# Patient Record
Sex: Male | Born: 1935 | Race: Black or African American | Hispanic: No | Marital: Married | Smoking: Former smoker
Health system: Southern US, Community
[De-identification: ages and names within clinical notes are randomized; demographics above are authoritative.]

## PROBLEM LIST (undated history)

## (undated) DIAGNOSIS — C61 Malignant neoplasm of prostate: Secondary | ICD-10-CM

## (undated) DIAGNOSIS — K219 Gastro-esophageal reflux disease without esophagitis: Secondary | ICD-10-CM

## (undated) DIAGNOSIS — R351 Nocturia: Secondary | ICD-10-CM

## (undated) DIAGNOSIS — Z8042 Family history of malignant neoplasm of prostate: Secondary | ICD-10-CM

## (undated) DIAGNOSIS — K5900 Constipation, unspecified: Secondary | ICD-10-CM

## (undated) DIAGNOSIS — R350 Frequency of micturition: Secondary | ICD-10-CM

## (undated) DIAGNOSIS — N400 Enlarged prostate without lower urinary tract symptoms: Secondary | ICD-10-CM

## (undated) DIAGNOSIS — R3915 Urgency of urination: Secondary | ICD-10-CM

## (undated) DIAGNOSIS — I2510 Atherosclerotic heart disease of native coronary artery without angina pectoris: Secondary | ICD-10-CM

## (undated) DIAGNOSIS — R339 Retention of urine, unspecified: Secondary | ICD-10-CM

## (undated) DIAGNOSIS — R972 Elevated prostate specific antigen [PSA]: Secondary | ICD-10-CM

## (undated) DIAGNOSIS — M1990 Unspecified osteoarthritis, unspecified site: Secondary | ICD-10-CM

## (undated) DIAGNOSIS — Z803 Family history of malignant neoplasm of breast: Secondary | ICD-10-CM

## (undated) DIAGNOSIS — I10 Essential (primary) hypertension: Secondary | ICD-10-CM

## (undated) HISTORY — DX: Family history of malignant neoplasm of breast: Z80.3

## (undated) HISTORY — DX: Essential (primary) hypertension: I10

## (undated) HISTORY — DX: Malignant neoplasm of prostate: C61

## (undated) HISTORY — PX: GALLBLADDER SURGERY: SHX652

## (undated) HISTORY — DX: Nocturia: R35.1

## (undated) HISTORY — DX: Atherosclerotic heart disease of native coronary artery without angina pectoris: I25.10

## (undated) HISTORY — PX: STOMACH SURGERY: SHX791

## (undated) HISTORY — PX: PROSTATE SURGERY: SHX751

## (undated) HISTORY — DX: Frequency of micturition: R35.0

## (undated) HISTORY — DX: Gastro-esophageal reflux disease without esophagitis: K21.9

## (undated) HISTORY — DX: Unspecified osteoarthritis, unspecified site: M19.90

## (undated) HISTORY — DX: Benign prostatic hyperplasia without lower urinary tract symptoms: N40.0

## (undated) HISTORY — DX: Family history of malignant neoplasm of prostate: Z80.42

## (undated) HISTORY — DX: Constipation, unspecified: K59.00

## (undated) HISTORY — DX: Retention of urine, unspecified: R33.9

## (undated) HISTORY — DX: Urgency of urination: R39.15

## (undated) HISTORY — DX: Elevated prostate specific antigen (PSA): R97.20

---

## 2004-09-30 ENCOUNTER — Ambulatory Visit: Payer: Self-pay | Admitting: Gastroenterology

## 2004-12-16 ENCOUNTER — Ambulatory Visit: Payer: Self-pay | Admitting: Gastroenterology

## 2004-12-24 ENCOUNTER — Ambulatory Visit: Payer: Self-pay | Admitting: Gastroenterology

## 2005-05-13 ENCOUNTER — Ambulatory Visit: Payer: Self-pay | Admitting: Gastroenterology

## 2005-05-15 ENCOUNTER — Ambulatory Visit: Payer: Self-pay | Admitting: Gastroenterology

## 2005-07-09 ENCOUNTER — Ambulatory Visit: Payer: Self-pay | Admitting: Gastroenterology

## 2006-07-10 ENCOUNTER — Ambulatory Visit: Payer: Self-pay | Admitting: Gastroenterology

## 2006-07-27 ENCOUNTER — Ambulatory Visit: Payer: Self-pay | Admitting: Gastroenterology

## 2006-07-30 ENCOUNTER — Ambulatory Visit: Payer: Self-pay | Admitting: Gastroenterology

## 2006-08-12 ENCOUNTER — Other Ambulatory Visit: Payer: Self-pay

## 2006-08-12 ENCOUNTER — Ambulatory Visit: Payer: Self-pay | Admitting: General Surgery

## 2006-08-17 ENCOUNTER — Ambulatory Visit: Payer: Self-pay | Admitting: General Surgery

## 2007-02-28 ENCOUNTER — Ambulatory Visit: Payer: Self-pay | Admitting: Gastroenterology

## 2007-06-28 ENCOUNTER — Ambulatory Visit: Payer: Self-pay | Admitting: Gastroenterology

## 2008-01-10 ENCOUNTER — Ambulatory Visit: Payer: Self-pay | Admitting: Unknown Physician Specialty

## 2008-05-09 ENCOUNTER — Ambulatory Visit: Payer: Self-pay | Admitting: Gastroenterology

## 2008-05-09 DIAGNOSIS — K219 Gastro-esophageal reflux disease without esophagitis: Secondary | ICD-10-CM

## 2008-12-20 ENCOUNTER — Ambulatory Visit: Payer: Self-pay | Admitting: Gastroenterology

## 2009-01-09 ENCOUNTER — Ambulatory Visit: Payer: Self-pay | Admitting: Surgery

## 2009-04-29 ENCOUNTER — Ambulatory Visit: Payer: Self-pay | Admitting: Surgery

## 2009-05-06 ENCOUNTER — Inpatient Hospital Stay: Payer: Self-pay | Admitting: Surgery

## 2011-04-01 ENCOUNTER — Ambulatory Visit: Payer: Self-pay | Admitting: Gastroenterology

## 2011-07-12 ENCOUNTER — Emergency Department: Payer: Self-pay | Admitting: Emergency Medicine

## 2011-07-12 LAB — COMPREHENSIVE METABOLIC PANEL
Alkaline Phosphatase: 45 U/L — ABNORMAL LOW (ref 50–136)
BUN: 15 mg/dL (ref 7–18)
Bilirubin,Total: 1.5 mg/dL — ABNORMAL HIGH (ref 0.2–1.0)
Calcium, Total: 9.2 mg/dL (ref 8.5–10.1)
Chloride: 104 mmol/L (ref 98–107)
Co2: 30 mmol/L (ref 21–32)
Creatinine: 1.06 mg/dL (ref 0.60–1.30)
EGFR (Non-African Amer.): >60
Glucose: 89 mg/dL (ref 65–99)
Osmolality: 287 (ref 275–301)
SGOT(AST): 28 U/L (ref 15–37)
SGPT (ALT): 34 U/L
Sodium: 144 mmol/L (ref 136–145)
Total Protein: 8.5 g/dL — ABNORMAL HIGH (ref 6.4–8.2)

## 2011-07-12 LAB — APTT: Activated PTT: 33.2 secs (ref 23.6–35.9)

## 2011-07-12 LAB — CBC
MCH: 29.4 pg (ref 26.0–34.0)
Platelet: 241 10*3/uL (ref 150–440)
RBC: 4.87 10*6/uL (ref 4.40–5.90)
RDW: 15.6 % — ABNORMAL HIGH (ref 11.5–14.5)
WBC: 11.3 10*3/uL — ABNORMAL HIGH (ref 3.8–10.6)

## 2011-07-12 LAB — CK TOTAL AND CKMB (NOT AT ARMC)
CK, Total: 210 U/L (ref 35–232)
CK-MB: 0.9 ng/mL (ref 0.5–3.6)

## 2012-04-07 ENCOUNTER — Ambulatory Visit: Payer: Self-pay | Admitting: Family Medicine

## 2013-03-02 ENCOUNTER — Ambulatory Visit: Payer: Self-pay | Admitting: Gastroenterology

## 2013-06-19 ENCOUNTER — Ambulatory Visit: Payer: Self-pay | Admitting: Unknown Physician Specialty

## 2014-11-12 ENCOUNTER — Encounter: Payer: Self-pay | Admitting: *Deleted

## 2014-11-13 ENCOUNTER — Other Ambulatory Visit: Payer: Self-pay | Admitting: Urology

## 2014-11-13 DIAGNOSIS — N138 Other obstructive and reflux uropathy: Secondary | ICD-10-CM

## 2014-11-13 DIAGNOSIS — N401 Enlarged prostate with lower urinary tract symptoms: Secondary | ICD-10-CM

## 2014-11-13 DIAGNOSIS — R339 Retention of urine, unspecified: Secondary | ICD-10-CM

## 2014-11-14 ENCOUNTER — Ambulatory Visit (INDEPENDENT_AMBULATORY_CARE_PROVIDER_SITE_OTHER): Payer: Medicare Other | Admitting: Urology

## 2014-11-14 ENCOUNTER — Encounter: Payer: Self-pay | Admitting: Urology

## 2014-11-14 DIAGNOSIS — N401 Enlarged prostate with lower urinary tract symptoms: Secondary | ICD-10-CM

## 2014-11-14 DIAGNOSIS — R339 Retention of urine, unspecified: Secondary | ICD-10-CM

## 2014-11-14 DIAGNOSIS — N138 Other obstructive and reflux uropathy: Secondary | ICD-10-CM

## 2014-11-14 LAB — BLADDER SCAN AMB NON-IMAGING: Scan Result: 23

## 2014-11-14 NOTE — Progress Notes (Signed)
11/14/2014 3:51 PM   Matthew Hunter 1930/01/11 [id number]  Referring provider: No referring provider defined for this encounter.  Chief Complaint  Patient presents with  . Elevated PSA    Patient states needs a check up hasn't had one sine 2015  . Benign Prostatic Hypertrophy    HPI: Is to [place] an 81 year old African-American male who was scheduled for yearly follow-up but wanted to touch base sooner. He is currently on finasteride and tamsulosin for his urinary symptoms.  He underwent a TUMT about 6 years ago. He is happy with the way he is urinating at this time.  His PVR is 23 mL.  He denies any fever, chills, nausea, vomiting, dysuria, gross hematuria or suprapubic pain.  PSA History:    2.66 ng/mL on 11/17/2011    4.54 ng/mL on 12/07/2013    3.3 ng/mL on 04/09/2014  PMH: Past Medical History  Diagnosis Date  . Hypertension   . GERD (gastroesophageal reflux disease)   . Arthritis   . CAD (coronary artery disease)   . Benign enlargement of prostate   . Incomplete bladder emptying   . Elevated PSA   . Urgency of micturation   . Urinary frequency   . Nocturia   . Constipation     Surgical History: Past Surgical History  Procedure Laterality Date  . Gallbladder surgery    . Prostate surgery    . Stomach surgery      Home Medications:    Medication List       This list is accurate as of: 11/14/14  3:51 PM.  Always use your most recent med list.               atenolol 50 MG tablet  Commonly known as:  TENORMIN  Take 50 mg by mouth daily.     finasteride 5 MG tablet  Commonly known as:  PROSCAR  Take 5 mg by mouth daily.     nortriptyline 50 MG capsule  Commonly known as:  PAMELOR  Take 50 mg by mouth at bedtime.     nortriptyline 25 MG capsule  Commonly known as:  PAMELOR     omeprazole 20 MG capsule  Commonly known as:  PRILOSEC  Take 20 mg by mouth daily.     tamsulosin 0.4 MG Caps capsule  Commonly known as:  FLOMAX  Take 0.4  mg by mouth daily.     Telmisartan-Amlodipine 80-5 MG Tabs  Take by mouth daily.     traMADol 50 MG tablet  Commonly known as:  ULTRAM  Take 50 mg by mouth every 6 (six) hours as needed.     vitamin B-6 25 MG tablet  Commonly known as:  pyridOXINE  Take 25 mg by mouth daily.        Allergies: No Known Allergies  Family History: Family History  Problem Relation Age of Onset  . Hypertension Mother   . Stroke Father     Social History:  reports that he has quit smoking. He does not have any smokeless tobacco history on file. He reports that he uses illicit drugs about twice per week. He reports that he does not drink alcohol.  ROS: Urological Symptom Review  Patient is experiencing the following symptoms: Frequent urination Get up at night to urinate Stream starts and stops Erection problems (male only)   Review of Systems  Gastrointestinal (upper)  : Negative for upper GI symptoms  Gastrointestinal (lower) : Diarrhea Constipation  Constitutional : Fatigue  Skin: Skin rash/lesion Itching  Eyes: Blurred vision  Ear/Nose/Throat : Sore throat Sinus problems  Hematologic/Lymphatic: Negative for Hematologic/Lymphatic symptoms  Cardiovascular : Chest pain  Respiratory : Cough  Endocrine: Negative for endocrine symptoms  Musculoskeletal: Back pain Joint pain  Neurological: Dizziness  Psychologic: Negative for psychiatric symptoms   Physical Exam: BP 135/74 mmHg  Pulse 66  Ht 5\' 9"  (1.753 m)  Wt 189 lb 9.6 oz (86.002 kg)  BMI 27.99 kg/m2  GU: Patient with uncircumcised phallus. Foreskin easily retracted  Urethral meatus is patent.  No penile discharge. No penile lesions or rashes. Scrotum without lesions, cysts, rashes and/or edema.  Testicles are located scrotally bilaterally. No masses are appreciated in the testicles. Left and right epididymis are normal. Rectal: Patient with  normal sphincter tone. Perineum without scarring or rashes.  No rectal masses are appreciated. Prostate is approximately 60 grams, no nodules are appreciated. Seminal vesicles are normal.   Laboratory Data: Results for orders placed or performed in visit on 11/14/14  PSA  Result Value Ref Range   Prostate Specific Ag, Serum 4.3 (H) 0.0 - 4.0 ng/mL  BLADDER SCAN AMB NON-IMAGING  Result Value Ref Range   Scan Result 23    Lab Results  Component Value Date   WBC 11.3* 07/12/2011   HGB 14.3 07/12/2011   HCT 43.1 07/12/2011   MCV 88 07/12/2011   PLT 241 07/12/2011    Lab Results  Component Value Date   CREATININE 1.06 07/12/2011    No results found for: PSA  No results found for: TESTOSTERONE  No results found for: HGBA1C  Urinalysis No results found for: COLORURINE, APPEARANCEUR, LABSPEC, PHURINE, GLUCOSEU, HGBUR, BILIRUBINUR, KETONESUR, PROTEINUR, UROBILINOGEN, NITRITE, LEUKOCYTESUR  Pertinent Imaging:   Assessment & Plan:    1. BPH with obstruction/lower urinary tract symptoms:  Patient's symptoms are controlled with finasteride and tamsulosin. He will continue the medications.  He will return in 6 months for an IPS S, DRE and PVR.  - PSA drawn today  PSA History:    2.66 ng/mL on 11/17/2011    4.54 ng/mL on 12/07/2013    3.3 ng/mL on 04/09/2014  2. Incomplete bladder emptying:   Patient is doing well with the tamsulosin and finasteride. He will continue these medications.   -BLADDER SCAN AMB NON-IMAGING     [name], [facility] [address], [place], [place] [address] [phone]

## 2014-11-15 LAB — PSA: Prostate Specific Ag, Serum: 4.3 ng/mL — ABNORMAL HIGH (ref 0.0–4.0)

## 2014-11-16 ENCOUNTER — Telehealth: Payer: Self-pay

## 2014-11-16 NOTE — Telephone Encounter (Signed)
LMOM

## 2014-11-16 NOTE — Telephone Encounter (Signed)
-----   Message from Nori Riis, PA-C sent at 11/16/2014  8:31 AM EDT ----- PSA is stable.  We will see him in 6 months.

## 2014-11-22 DIAGNOSIS — N400 Enlarged prostate without lower urinary tract symptoms: Secondary | ICD-10-CM | POA: Insufficient documentation

## 2014-11-22 DIAGNOSIS — R339 Retention of urine, unspecified: Secondary | ICD-10-CM | POA: Insufficient documentation

## 2015-01-25 ENCOUNTER — Telehealth: Payer: Self-pay | Admitting: Urology

## 2015-01-25 NOTE — Telephone Encounter (Signed)
Dr. Barbarann Ehlers office faxed a PSA result over from the 16th of August.  It was elevated at 6.12 ng/mL, but the threshold for considering a biopsy in a gentlemen over 70 is a PSA of 10 ng/mL.  I still suggest following up as scheduled in December.

## 2015-01-25 NOTE — Telephone Encounter (Signed)
Spoke with pt in reference to PSA. Pt voiced understanding.  

## 2015-05-08 ENCOUNTER — Other Ambulatory Visit: Payer: Self-pay | Admitting: Urology

## 2015-05-08 DIAGNOSIS — N400 Enlarged prostate without lower urinary tract symptoms: Secondary | ICD-10-CM

## 2015-05-14 ENCOUNTER — Other Ambulatory Visit: Payer: Medicare Other

## 2015-05-14 DIAGNOSIS — R972 Elevated prostate specific antigen [PSA]: Secondary | ICD-10-CM

## 2015-05-15 LAB — PSA: PROSTATE SPECIFIC AG, SERUM: 6.3 ng/mL — AB (ref 0.0–4.0)

## 2015-05-16 ENCOUNTER — Encounter: Payer: Self-pay | Admitting: Urology

## 2015-05-16 ENCOUNTER — Ambulatory Visit (INDEPENDENT_AMBULATORY_CARE_PROVIDER_SITE_OTHER): Payer: Medicare Other | Admitting: Urology

## 2015-05-16 VITALS — BP 129/71 | HR 76 | Ht 69.0 in | Wt 192.8 lb

## 2015-05-16 DIAGNOSIS — N401 Enlarged prostate with lower urinary tract symptoms: Secondary | ICD-10-CM | POA: Diagnosis not present

## 2015-05-16 DIAGNOSIS — R972 Elevated prostate specific antigen [PSA]: Secondary | ICD-10-CM

## 2015-05-16 DIAGNOSIS — N138 Other obstructive and reflux uropathy: Secondary | ICD-10-CM

## 2015-05-16 NOTE — Progress Notes (Signed)
2:52 PM   Chadron Duel Jan 12, 1930 [id number]  Referring provider: Maryland Pink, MD [address] [facility] [place], [place] [address]  Chief Complaint  Patient presents with  . Elevated PSA    6 month follow up    HPI: Patient is an 81 year old African-American male with a history of elevated PSA and BPH with LUTS who presents today for 6 month follow-up.  Elevated PSA Patient's PSA is currently 6.3 ng/mL.  He is on finasteride at this time.    BPH WITH LUTS His IPSS score today is 12, which is moderate lower urinary tract symptomatology. He is mostly satisfied with his quality life due to his urinary symptoms.  He denies any dysuria, hematuria or suprapubic pain.  He currently taking tamsulosin and finasteride.  His has had TUMT about 7 years ago.   He also denies any recent fevers, chills, nausea or vomiting.  He does not have a family history of PCa.      IPSS      05/16/15 1400       International Prostate Symptom Score   How often have you had the sensation of not emptying your bladder? About half the time     How often have you had to urinate less than every two hours? About half the time     How often have you found you stopped and started again several times when you urinated? Less than half the time     How often have you found it difficult to postpone urination? Not at All     How often have you had a weak urinary stream? About half the time     How often have you had to strain to start urination? Not at All     How many times did you typically get up at night to urinate? 1 Time     Total IPSS Score 12     Quality of Life due to urinary symptoms   If you were to spend the rest of your life with your urinary condition just the way it is now how would you feel about that? Mostly Satisfied        Score:  1-7 Mild 8-19 Moderate 20-35 Severe      PMH: Past Medical History  Diagnosis Date  . Hypertension   . GERD (gastroesophageal reflux  disease)   . Arthritis   . CAD (coronary artery disease)   . Benign enlargement of prostate   . Incomplete bladder emptying   . Elevated PSA   . Urgency of micturation   . Urinary frequency   . Nocturia   . Constipation     Surgical History: Past Surgical History  Procedure Laterality Date  . Gallbladder surgery    . Prostate surgery    . Stomach surgery      Home Medications:    Medication List       This list is accurate as of: 05/16/15  2:52 PM.  Always use your most recent med list.               atenolol 50 MG tablet  Commonly known as:  TENORMIN  Take 50 mg by mouth daily.     finasteride 5 MG tablet  Commonly known as:  PROSCAR  take 1 tablet by mouth once daily     nortriptyline 50 MG capsule  Commonly known as:  PAMELOR  Take 50 mg by mouth at bedtime. Reported on 05/16/2015  nortriptyline 25 MG capsule  Commonly known as:  PAMELOR  Reported on 05/16/2015     omeprazole 20 MG capsule  Commonly known as:  PRILOSEC  Take 20 mg by mouth daily.     polyethylene glycol powder powder  Commonly known as:  GLYCOLAX/MIRALAX  Take by mouth.     tamsulosin 0.4 MG Caps capsule  Commonly known as:  FLOMAX  Take 0.4 mg by mouth daily.     Telmisartan-Amlodipine 80-5 MG Tabs  Take by mouth daily.     traMADol 50 MG tablet  Commonly known as:  ULTRAM  Take 50 mg by mouth every 6 (six) hours as needed.     vitamin B-6 25 MG tablet  Commonly known as:  pyridOXINE  Take 25 mg by mouth daily.        Allergies: No Known Allergies  Family History: Family History  Problem Relation Age of Onset  . Hypertension Mother   . Stroke Father   . Kidney disease Neg Hx   . Prostate cancer Neg Hx     Social History:  reports that he has quit smoking. He does not have any smokeless tobacco history on file. He reports that he uses illicit drugs about twice per week. He reports that he does not drink alcohol.  ROS: Urological Symptom Review  Patient is  experiencing the following symptoms: Frequent urination Get up at night to urinate Stream starts and stops Erection problems (male only)   Review of Systems  Gastrointestinal (upper)  : Negative for upper GI symptoms  Gastrointestinal (lower) : Diarrhea Constipation  Constitutional : Fatigue  Skin: Skin rash/lesion Itching  Eyes: Blurred vision  Ear/Nose/Throat : Sore throat Sinus problems  Hematologic/Lymphatic: Negative for Hematologic/Lymphatic symptoms  Cardiovascular : Chest pain  Respiratory : Cough  Endocrine: Negative for endocrine symptoms  Musculoskeletal: Back pain Joint pain  Neurological: Dizziness  Psychologic: Negative for psychiatric symptoms   Physical Exam: BP 129/71 mmHg  Pulse 76  Ht 5\' 9"  (1.753 m)  Wt 192 lb 12.8 oz (87.454 kg)  BMI 28.46 kg/m2  GU: Patient with uncircumcised phallus. Foreskin easily retracted  Urethral meatus is patent.  No penile discharge. No penile lesions or rashes. Scrotum without lesions, cysts, rashes and/or edema.  Testicles are located scrotally bilaterally. No masses are appreciated in the testicles. Left and right epididymis are normal. Rectal: Patient with  normal sphincter tone. Perineum without scarring or rashes. No rectal masses are appreciated. Prostate is approximately 60 grams, no nodules are appreciated. Seminal vesicles are normal.   Laboratory Data: Results for orders placed or performed in visit on 05/14/15  PSA  Result Value Ref Range   Prostate Specific Ag, Serum 6.3 (H) 0.0 - 4.0 ng/mL   Lab Results  Component Value Date   WBC 11.3* 07/12/2011   HGB 14.3 07/12/2011   HCT 43.1 07/12/2011   MCV 88 07/12/2011   PLT 241 07/12/2011    Lab Results  Component Value Date   CREATININE 1.06 07/12/2011   PSA History:    2.66 ng/mL on 11/17/2011    4.54 ng/mL on 12/07/2013    3.3 ng/mL on 04/09/2014  Lab Results  Component Value Date   PSA 6.3* 05/14/2015   PSA 4.3*  11/14/2014    Assessment & Plan:    1. BPH with obstruction/lower urinary tract symptoms:   IPSS score is 12/2.  Patient's symptoms are controlled with finasteride and tamsulosin. He will continue the medications.  He will return in  6 months for an IPSS, exam and PVR.  2. Elevated PSA:   Patient's most recent PSA of 6.3 on 05/14/2015.  Adjusted for finasteride use, the true value is 12.6.  I explained this to the patient and advised him of the AUA guidelines for PSA screening in men age 46+ years that the threshold for biopsy should be raised to 10 ng/mL.  He is just over that value now.  I did offer a biopsy at this time, but the patient declined.  He will RTC in 6 months for an exam and PSA.      [name], [facility] [address] [place], [place] [address] [phone]

## 2015-06-27 ENCOUNTER — Other Ambulatory Visit: Payer: Self-pay | Admitting: Urology

## 2015-06-27 DIAGNOSIS — N400 Enlarged prostate without lower urinary tract symptoms: Secondary | ICD-10-CM

## 2015-10-24 ENCOUNTER — Other Ambulatory Visit: Payer: Self-pay | Admitting: Urology

## 2015-11-06 ENCOUNTER — Other Ambulatory Visit: Payer: Self-pay

## 2015-11-06 DIAGNOSIS — R972 Elevated prostate specific antigen [PSA]: Secondary | ICD-10-CM

## 2015-11-07 ENCOUNTER — Other Ambulatory Visit: Payer: Medicare Other

## 2015-11-07 DIAGNOSIS — R972 Elevated prostate specific antigen [PSA]: Secondary | ICD-10-CM

## 2015-11-08 LAB — PSA: PROSTATE SPECIFIC AG, SERUM: 7.1 ng/mL — AB (ref 0.0–4.0)

## 2015-11-14 ENCOUNTER — Encounter: Payer: Self-pay | Admitting: Urology

## 2015-11-14 ENCOUNTER — Ambulatory Visit (INDEPENDENT_AMBULATORY_CARE_PROVIDER_SITE_OTHER): Payer: Medicare Other | Admitting: Urology

## 2015-11-14 VITALS — BP 133/78 | HR 66 | Ht 68.0 in | Wt 194.0 lb

## 2015-11-14 DIAGNOSIS — R972 Elevated prostate specific antigen [PSA]: Secondary | ICD-10-CM

## 2015-11-14 DIAGNOSIS — N138 Other obstructive and reflux uropathy: Secondary | ICD-10-CM

## 2015-11-14 DIAGNOSIS — N401 Enlarged prostate with lower urinary tract symptoms: Secondary | ICD-10-CM | POA: Diagnosis not present

## 2015-11-14 LAB — BLADDER SCAN AMB NON-IMAGING: Scan Result: 122

## 2015-11-14 NOTE — Progress Notes (Signed)
11:17 AM   Matthew Hunter March 08, 1930 [id number]  Referring provider: Maryland Pink, MD [address] [facility] [place], [place] [address]  Chief Complaint  Patient presents with  . Follow-up    6 mo  . Benign Prostatic Hypertrophy    HPI: Patient is an 82 year old African-American male with a history of elevated PSA and BPH with LUTS who presents today for 6 month follow-up.  Elevated PSA Patient's PSA is currently 7.1 ng/mL.  He is on finasteride at this time.    BPH WITH LUTS His IPSS score today is 8, which is moderate lower urinary tract symptomatology. He is mostly satisfied with his quality life due to his urinary symptoms.  He denies any dysuria, hematuria or suprapubic pain.  He currently taking tamsulosin and finasteride.  His has had TUMT about 7 years ago.  He also denies any recent fevers, chills, nausea or vomiting.  He does not have a family history of PCa.      IPSS      11/14/15 1000       International Prostate Symptom Score   How often have you had the sensation of not emptying your bladder? Less than half the time     How often have you had to urinate less than every two hours? Less than half the time     How often have you found you stopped and started again several times when you urinated? Less than 1 in 5 times     How often have you found it difficult to postpone urination? Less than 1 in 5 times     How often have you had a weak urinary stream? Less than half the time     How often have you had to strain to start urination? Not at All     How many times did you typically get up at night to urinate? None     Total IPSS Score 8     Quality of Life due to urinary symptoms   If you were to spend the rest of your life with your urinary condition just the way it is now how would you feel about that? Mostly Satisfied        Score:  1-7 Mild 8-19 Moderate 20-35 Severe   PMH: Past Medical History  Diagnosis Date  . Hypertension   .  GERD (gastroesophageal reflux disease)   . Arthritis   . CAD (coronary artery disease)   . Benign enlargement of prostate   . Incomplete bladder emptying   . Elevated PSA   . Urgency of micturation   . Urinary frequency   . Nocturia   . Constipation     Surgical History: Past Surgical History  Procedure Laterality Date  . Gallbladder surgery    . Prostate surgery    . Stomach surgery      Home Medications:    Medication List       This list is accurate as of: 11/14/15 11:17 AM.  Always use your most recent med list.               amlodipine-atorvastatin 5-80 MG tablet  Commonly known as:  CADUET  Take 1 tablet by mouth daily.     atenolol 50 MG tablet  Commonly known as:  TENORMIN  Take 50 mg by mouth daily.     finasteride 5 MG tablet  Commonly known as:  PROSCAR  take 1 tablet by mouth once daily  nortriptyline 25 MG capsule  Commonly known as:  PAMELOR  Reported on 05/16/2015     omeprazole 20 MG capsule  Commonly known as:  PRILOSEC  Take 20 mg by mouth daily.     polyethylene glycol powder powder  Commonly known as:  GLYCOLAX/MIRALAX  Take by mouth.     tamsulosin 0.4 MG Caps capsule  Commonly known as:  FLOMAX  Take 0.4 mg by mouth daily.     Telmisartan-Amlodipine 80-5 MG Tabs  Take by mouth daily.     traMADol 50 MG tablet  Commonly known as:  ULTRAM  Take 50 mg by mouth every 6 (six) hours as needed.     vitamin B-6 25 MG tablet  Commonly known as:  pyridOXINE  Take 25 mg by mouth daily.        Allergies: No Known Allergies  Family History: Family History  Problem Relation Age of Onset  . Hypertension Mother   . Stroke Father   . Kidney disease Neg Hx   . Prostate cancer Neg Hx     Social History:  reports that he has quit smoking. He does not have any smokeless tobacco history on file. He reports that he uses illicit drugs about twice per week. He reports that he does not drink alcohol.  ROS: Urological Symptom  Review  Patient is experiencing the following symptoms: Frequent urination Get up at night to urinate Stream starts and stops Erection problems (male only)   Review of Systems  Gastrointestinal (upper)  : Negative for upper GI symptoms  Gastrointestinal (lower) : Diarrhea Constipation  Constitutional : Fatigue  Skin: Skin rash/lesion Itching  Eyes: Blurred vision  Ear/Nose/Throat : Sore throat Sinus problems  Hematologic/Lymphatic: Negative for Hematologic/Lymphatic symptoms  Cardiovascular : Chest pain  Respiratory : Cough  Endocrine: Negative for endocrine symptoms  Musculoskeletal: Back pain Joint pain  Neurological: Dizziness  Psychologic: Negative for psychiatric symptoms   Physical Exam: BP 133/78 mmHg  Pulse 66  Ht 5\' 8"  (1.727 m)  Wt 194 lb (87.998 kg)  BMI 29.50 kg/m2  GU: Patient with uncircumcised phallus. Foreskin easily retracted  Urethral meatus is patent.  No penile discharge. No penile lesions or rashes. Scrotum without lesions, cysts, rashes and/or edema.  Testicles are located scrotally bilaterally. No masses are appreciated in the testicles. Left and right epididymis are normal. Rectal: Patient with  normal sphincter tone. Perineum without scarring or rashes. No rectal masses are appreciated. Prostate is approximately 60 grams, no nodules are appreciated. Seminal vesicles are normal.   Laboratory Data: Results for orders placed or performed in visit on 11/14/15  BLADDER SCAN AMB NON-IMAGING  Result Value Ref Range   Scan Result 122    Lab Results  Component Value Date   WBC 11.3* 07/12/2011   HGB 14.3 07/12/2011   HCT 43.1 07/12/2011   MCV 88 07/12/2011   PLT 241 07/12/2011    Lab Results  Component Value Date   CREATININE 1.06 07/12/2011   PSA History:    2.66 ng/mL on 11/17/2011    4.54 ng/mL on 12/07/2013    3.3 ng/mL on 04/09/2014    4.3 ng/mL on 10/2014    6.3 ng/mL on 04/2015    7.1 ng/mL on  11/07/2015    Assessment & Plan:    1. BPH with obstruction/lower urinary tract symptoms:   IPSS score is 8/2.  Patient's symptoms are controlled with finasteride and tamsulosin. He will continue the medications.  He will return in 6  months for an IPSS, exam and PVR.  2. Elevated PSA:   Patient's most recent PSA of 7.1 on 11/07/2015.  Adjusted for finasteride use, the true value is 14.2.   I discussed abnormal findings with patient and possible implications, including possible prostate cancer. He understands that at his age that the risk of the patient dying from low-grade prostate cancer at this point is also very low. We typically stop screening between the ages of 13 and 46 for this reason. At his age, the treatment for prostate cancer is palliative most often and is not initiated until the patient becomes symptomatic including worsening voiding symptoms or bone pain. Patient participated in shared decision-making today. Declined a biopsy, bu he would like to continue PSA screenings.  He will RTC in 6 months for an exam and PSA.     [name], [facility] [address] [place], [place] [address] [phone]

## 2016-05-08 ENCOUNTER — Other Ambulatory Visit: Payer: Self-pay

## 2016-05-08 DIAGNOSIS — R972 Elevated prostate specific antigen [PSA]: Secondary | ICD-10-CM

## 2016-05-11 ENCOUNTER — Other Ambulatory Visit: Payer: Medicare Other

## 2016-05-11 DIAGNOSIS — R972 Elevated prostate specific antigen [PSA]: Secondary | ICD-10-CM

## 2016-05-12 LAB — PSA: Prostate Specific Ag, Serum: 10.4 ng/mL — ABNORMAL HIGH (ref 0.0–4.0)

## 2016-05-14 ENCOUNTER — Encounter: Payer: Self-pay | Admitting: Urology

## 2016-05-14 ENCOUNTER — Ambulatory Visit: Payer: Medicare Other | Admitting: Urology

## 2016-05-14 NOTE — Progress Notes (Deleted)
9:[place] 1930/05/20 [id number]  Referring provider: Maryland Pink, MD [address] [facility] [place],  [address]  No chief complaint on file.   HPI: Patient is an 82 year old African-American male with a history of elevated PSA and BPH with LUTS who presents today for 6 month follow-up.  Elevated PSA Patient's PSA is currently 10.4 ng/mL on 05/11/2016.    He is on finasteride at this time.    BPH WITH LUTS His IPSS score today is 8, which is moderate lower urinary tract symptomatology. He is mostly satisfied with his quality life due to his urinary symptoms.  His previous IPSS score was 8/2/  He denies any dysuria, hematuria or suprapubic pain.  He currently taking tamsulosin and finasteride.  His has had TUMT about 7 years ago.  He also denies any recent fevers, chills, nausea or vomiting.  He does not have a family history of PCa.    Score:  1-7 Mild 8-19 Moderate 20-35 Severe   PMH: Past Medical History:  Diagnosis Date  . Arthritis   . Benign enlargement of prostate   . CAD (coronary artery disease)   . Constipation   . Elevated PSA   . GERD (gastroesophageal reflux disease)   . Hypertension   . Incomplete bladder emptying   . Nocturia   . Urgency of micturation   . Urinary frequency     Surgical History: Past Surgical History:  Procedure Laterality Date  . GALLBLADDER SURGERY    . PROSTATE SURGERY    . STOMACH SURGERY      Home Medications:    Medication List       Accurate as of 05/14/16  9:27 AM. Always use your most recent med list.          amlodipine-atorvastatin 5-80 MG tablet Commonly known as:  CADUET Take 1 tablet by mouth daily.   atenolol 50 MG tablet Commonly known as:  TENORMIN Take 50 mg by mouth daily.   finasteride 5 MG tablet Commonly known as:  PROSCAR take 1 tablet by mouth once daily   nortriptyline 25 MG capsule Commonly known as:  PAMELOR Reported on 05/16/2015   omeprazole 20  MG capsule Commonly known as:  PRILOSEC Take 20 mg by mouth daily.   polyethylene glycol powder powder Commonly known as:  GLYCOLAX/MIRALAX Take by mouth.   tamsulosin 0.4 MG Caps capsule Commonly known as:  FLOMAX Take 0.4 mg by mouth daily.   Telmisartan-Amlodipine 80-5 MG Tabs Take by mouth daily.   traMADol 50 MG tablet Commonly known as:  ULTRAM Take 50 mg by mouth every 6 (six) hours as needed.   vitamin B-6 25 MG tablet Commonly known as:  pyridOXINE Take 25 mg by mouth daily.       Allergies: No Known Allergies  Family History: Family History  Problem Relation Age of Onset  . Hypertension Mother   . Stroke Father   . Kidney disease Neg Hx   . Prostate cancer Neg Hx     Social History:  reports that he has quit smoking. He does not have any smokeless tobacco history on file. He reports that he uses drugs about 2 times per week. He reports that he does not drink alcohol.  ROS:     Physical Exam: There were no vitals taken for this visit.  GU: Patient with uncircumcised phallus. Foreskin easily retracted  Urethral meatus is patent.  No penile discharge. No penile lesions or  rashes. Scrotum without lesions, cysts, rashes and/or edema.  Testicles are located scrotally bilaterally. No masses are appreciated in the testicles. Left and right epididymis are normal. Rectal: Patient with  normal sphincter tone. Perineum without scarring or rashes. No rectal masses are appreciated. Prostate is approximately 60 grams, no nodules are appreciated. Seminal vesicles are normal.   Laboratory Data: PSA History:    2.66 ng/mL on 11/17/2011    4.54 ng/mL on 12/07/2013    3.3 ng/mL on 04/09/2014    4.3 ng/mL on 10/2014    6.3 ng/mL on 04/2015   7.1 ng/mL on 11/07/2015 Results for orders placed or performed in visit on 05/11/16  PSA  Result Value Ref Range   Prostate Specific Ag, Serum 10.4 (H) 0.0 - 4.0 ng/mL   Lab Results  Component Value Date   WBC 11.3 (H)  07/12/2011   HGB 14.3 07/12/2011   HCT 43.1 07/12/2011   MCV 88 07/12/2011   PLT 241 07/12/2011    Lab Results  Component Value Date   CREATININE 1.06 07/12/2011     Assessment & Plan:    1. Elevated PSA:   Patient's most recent PSA of 10.4 on 05/11/2016.  Adjusted for finasteride use, the true value is 14.2.   I discussed abnormal findings with patient and possible implications, including possible prostate cancer. He understands that at his age that the risk of the patient dying from low-grade prostate cancer at this point is also very low. We typically stop screening between the ages of 46 and 75 for this reason. At his age, the treatment for prostate cancer is palliative most often and is not initiated until the patient becomes symptomatic including worsening voiding symptoms or bone pain. Patient participated in shared decision-making today. Declined a biopsy, bu he would like to continue PSA screenings.  He will RTC in 6 months for an exam and PSA.     2. BPH with LUTS  - IPSS score is ***, it is stable/improving/worsening  - Continue conservative management, avoiding bladder irritants and timed voiding's  - Initiate alpha-blocker (***), discussed side effects  - Initiate 5 alpha reductase inhibitor (***), discussed side effects  - Continue tamsulosin 0.4 mg daily, alfuzosin 10 mg daily, Rapaflo 8 mg daily, terazosin, doxazosin, Cialis 5 mg daily and finasteride 5 mg daily, dutasteride 0.5 mg daily***  - Cannot tolerate medication or medication failure, schedule cystoscopy  - RTC in *** months for IPSS, PSA, PVR and exam      [name], [facility] [address] [place], [place] [address] [phone]

## 2016-05-15 ENCOUNTER — Ambulatory Visit: Payer: Medicare Other | Admitting: Urology

## 2016-05-19 ENCOUNTER — Ambulatory Visit: Payer: Medicare Other | Admitting: Urology

## 2016-05-22 ENCOUNTER — Telehealth: Payer: Self-pay | Admitting: Urology

## 2016-05-22 NOTE — Telephone Encounter (Signed)
Certified letter sent. Tracking [id number]

## 2016-05-22 NOTE — Telephone Encounter (Signed)
Patient has missed his appointments and does not want to reschedule.  Please send him a certified letter stating the his PSA is elevated and it may mean that he has prostate cancer.  We would like to have further discussion regarding this finding.

## 2016-05-22 NOTE — Telephone Encounter (Signed)
-----   Message from Nori Riis, PA-C sent at 05/22/2016  7:54 AM EST ----- Patient was a No Show for his appointment.  He needs to be rescheduled due to his elevated PSA.  If he will not reschedule or we have difficulty contacting him, we need to send him a certified letter.

## 2016-05-22 NOTE — Telephone Encounter (Signed)
We reschd the appt that he no showed for already and he called back and cancelled it for the 19th. He said he did not want to reschd it. I guess you need to have [facility] send him a letter.   Sharyn Lull

## 2016-06-11 ENCOUNTER — Encounter: Payer: Self-pay | Admitting: Urology

## 2016-06-11 ENCOUNTER — Ambulatory Visit: Payer: Medicare Other | Admitting: Urology

## 2016-06-11 VITALS — BP 151/84 | HR 64 | Ht 68.0 in | Wt 199.2 lb

## 2016-06-11 DIAGNOSIS — R972 Elevated prostate specific antigen [PSA]: Secondary | ICD-10-CM

## 2016-06-11 DIAGNOSIS — I10 Essential (primary) hypertension: Secondary | ICD-10-CM | POA: Insufficient documentation

## 2016-06-11 DIAGNOSIS — N401 Enlarged prostate with lower urinary tract symptoms: Secondary | ICD-10-CM

## 2016-06-11 DIAGNOSIS — N138 Other obstructive and reflux uropathy: Secondary | ICD-10-CM | POA: Diagnosis not present

## 2016-06-11 DIAGNOSIS — M1990 Unspecified osteoarthritis, unspecified site: Secondary | ICD-10-CM | POA: Insufficient documentation

## 2016-06-11 DIAGNOSIS — Z86010 Personal history of colonic polyps: Secondary | ICD-10-CM | POA: Insufficient documentation

## 2016-06-11 DIAGNOSIS — K648 Other hemorrhoids: Secondary | ICD-10-CM | POA: Insufficient documentation

## 2016-06-11 DIAGNOSIS — F1011 Alcohol abuse, in remission: Secondary | ICD-10-CM | POA: Insufficient documentation

## 2016-06-11 MED ORDER — TAMSULOSIN HCL 0.4 MG PO CAPS: 0.4000 mg | ORAL_CAPSULE | Freq: Every day | ORAL | 3 refills | Status: DC

## 2016-06-11 MED ORDER — FINASTERIDE 5 MG PO TABS: 5.0000 mg | ORAL_TABLET | Freq: Every day | ORAL | 3 refills | Status: AC

## 2016-06-11 NOTE — Progress Notes (Signed)
9:23 AM   Matthew [place] June 22, 1929 [id number]  Referring provider: Maryland Pink, MD [address] [facility] [place], [place] [address]  Chief Complaint  Patient presents with  . Benign Prostatic Hypertrophy    HPI: Patient is an 83 year old African-American male with a history of elevated PSA and BPH with LUTS who presents today for 6 month follow-up.  Elevated PSA Patient's PSA is currently 10.4 ng/mL on 05/11/2016.    He is on finasteride at this time.    BPH WITH LUTS His IPSS score today is 11, which is moderate lower urinary tract symptomatology. He is pleased with his quality life due to his urinary symptoms.  His previous IPSS score was 8/2.  His major complaints are frequency and intermittency.  He denies any dysuria, hematuria or suprapubic pain.  He currently taking tamsulosin and finasteride.  His has had TUMT about 7 years ago.  He also denies any recent fevers, chills, nausea or vomiting.  He does not have a family history of PCa.      IPSS    Row Name 06/11/16 0800         International Prostate Symptom Score   How often have you had the sensation of not emptying your bladder? Less than half the time     How often have you had to urinate less than every two hours? About half the time     How often have you found you stopped and started again several times when you urinated? Less than 1 in 5 times     How often have you found it difficult to postpone urination? Less than half the time     How often have you had a weak urinary stream? Less than 1 in 5 times     How often have you had to strain to start urination? Less than 1 in 5 times     How many times did you typically get up at night to urinate? 1 Time     Total IPSS Score 11       Quality of Life due to urinary symptoms   If you were to spend the rest of your life with your urinary condition just the way it is now how would you feel about that? Pleased        Score:  1-7 Mild 8-19  Moderate 20-35 Severe   PMH: Past Medical History:  Diagnosis Date  . Arthritis   . Benign enlargement of prostate   . CAD (coronary artery disease)   . Constipation   . Elevated PSA   . GERD (gastroesophageal reflux disease)   . Hypertension   . Incomplete bladder emptying   . Nocturia   . Urgency of micturation   . Urinary frequency     Surgical History: Past Surgical History:  Procedure Laterality Date  . GALLBLADDER SURGERY    . PROSTATE SURGERY    . STOMACH SURGERY      Home Medications:  Allergies as of 06/11/2016   No Known Allergies     Medication List       Accurate as of 06/11/16  9:23 AM. Always use your most recent med list.          amlodipine-atorvastatin 5-80 MG tablet Commonly known as:  CADUET Take 1 tablet by mouth daily.   atenolol 50 MG tablet Commonly known as:  TENORMIN Take 50 mg by mouth daily.   finasteride 5 MG tablet Commonly known as:  PROSCAR  Take 1 tablet (5 mg total) by mouth daily.   nortriptyline 25 MG capsule Commonly known as:  PAMELOR Reported on 05/16/2015   omeprazole 20 MG capsule Commonly known as:  PRILOSEC Take 20 mg by mouth daily.   polyethylene glycol powder powder Commonly known as:  GLYCOLAX/MIRALAX Take by mouth.   tamsulosin 0.4 MG Caps capsule Commonly known as:  FLOMAX Take 1 capsule (0.4 mg total) by mouth daily.   Telmisartan-Amlodipine 80-5 MG Tabs Take by mouth daily.   traMADol 50 MG tablet Commonly known as:  ULTRAM Take 50 mg by mouth every 6 (six) hours as needed.   vitamin B-6 25 MG tablet Commonly known as:  pyridOXINE Take 25 mg by mouth daily.       Allergies: No Known Allergies  Family History: Family History  Problem Relation Age of Onset  . Hypertension Mother   . Stroke Father   . Kidney disease Neg Hx   . Prostate cancer Neg Hx     Social History:  reports that he has quit smoking. He does not have any smokeless tobacco history on file. He reports that he  uses drugs about 2 times per week. He reports that he does not drink alcohol.  ROS: UROLOGY Frequent Urination?: Yes Hard to postpone urination?: No Burning/pain with urination?: No Get up at night to urinate?: No Leakage of urine?: No Urine stream starts and stops?: Yes Trouble starting stream?: No Do you have to strain to urinate?: No Blood in urine?: No Urinary tract infection?: No Sexually transmitted disease?: No Injury to kidneys or bladder?: No Painful intercourse?: No Weak stream?: No Erection problems?: No Penile pain?: No Gastrointestinal Nausea?: No Vomiting?: No Indigestion/heartburn?: No Diarrhea?: No Constipation?: Yes Constitutional Fever: No Night sweats?: No Weight loss?: No Fatigue?: Yes Skin Skin rash/lesions?: No Itching?: No Eyes Blurred vision?: Yes Double vision?: No Ears/Nose/Throat Sore throat?: No Sinus problems?: No Hematologic/Lymphatic Swollen glands?: No Easy bruising?: No Cardiovascular Leg swelling?: No Chest pain?: No Respiratory Cough?: No Shortness of breath?: No Endocrine Excessive thirst?: No Musculoskeletal Back pain?: Yes Joint pain?: No Neurological Headaches?: No Dizziness?: Yes Psychologic Depression?: No Anxiety?: No   Physical Exam: BP (!) 151/84 (BP Location: Left Arm, Patient Position: Sitting, Cuff Size: Normal)   Pulse 64   Ht 5\' 8"  (1.727 m)   Wt 199 lb 3.2 oz (90.4 kg)   BMI 30.29 kg/m   GU: Patient with uncircumcised phallus. Foreskin easily retracted  Urethral meatus is patent.  No penile discharge. No penile lesions or rashes. Scrotum without lesions, cysts, rashes and/or edema.  Testicles are located scrotally bilaterally. No masses are appreciated in the testicles. Left and right epididymis are normal. Rectal: Patient with  normal sphincter tone. Perineum without scarring or rashes. No rectal masses are appreciated. Prostate is approximately 60 grams, rubbery nodule in the left lobe (10 mm x 5  mm) is appreciated. Seminal vesicles are normal.  Laboratory Data: PSA History:     2.66 ng/mL on 11/17/2011     4.54 ng/mL on 12/07/2013     3.3 ng/mL on 04/09/2014     4.3 ng/mL on 10/2014     6.3 ng/mL on 04/2015    7.1 ng/mL on 11/07/2015 Results for orders placed or performed in visit on 05/11/16  PSA  Result Value Ref Range   Prostate Specific Ag, Serum 10.4 (H) 0.0 - 4.0 ng/mL   Lab Results  Component Value Date   WBC 11.3 (H) 07/12/2011   HGB  14.3 07/12/2011   HCT 43.1 07/12/2011   MCV 88 07/12/2011   PLT 241 07/12/2011    Lab Results  Component Value Date   CREATININE 1.06 07/12/2011     Assessment & Plan:    1. Elevated PSA:   Patient's most recent PSA of 10.4 on 05/11/2016.  Adjusted for finasteride use, the true value is 20.8.  I discussed abnormal findings with patient and possible implications, including possible prostate cancer. He understands that at his age that the risk of the patient dying from low-grade prostate cancer at this point is also very low. We typically stop screening between the ages of 71 and 59 for this reason. At his age, the treatment for prostate cancer is palliative most often and is not initiated until the patient becomes symptomatic including worsening voiding symptoms or bone pain. Patient participated in shared decision-making today. Declined a biopsy, bu he would like to continue PSA screenings.  I will obtain a free and total PSA today.       2. BPH with LUTS  - IPSS score is 11/2, it is worsening  - Continue conservative management, avoiding bladder irritants and timed voiding's  - Continue tamsulosin 0.4 mg daily and finasteride 5 mg daily; refills given  - RTC pending free and total PSA results   [name], PA-C  [facility] [address], [place], [place] [address] [phone]

## 2016-06-12 LAB — PSA, TOTAL AND FREE
PROSTATE SPECIFIC AG, SERUM: 10.6 ng/mL — AB (ref 0.0–4.0)
PSA FREE: 1.28 ng/mL
PSA, Free Pct: 12.1 %

## 2016-06-25 ENCOUNTER — Telehealth: Payer: Self-pay

## 2016-06-25 DIAGNOSIS — N400 Enlarged prostate without lower urinary tract symptoms: Secondary | ICD-10-CM

## 2016-06-25 MED ORDER — DUTASTERIDE 0.5 MG PO CAPS: 0.5000 mg | ORAL_CAPSULE | Freq: Every day | ORAL | 0 refills | Status: DC

## 2016-06-25 NOTE — Telephone Encounter (Signed)
Spoke with pt in reference to needing labs again in 73mo. Lab appt made and orders placed.

## 2016-06-25 NOTE — Telephone Encounter (Signed)
LMOM- medication sent to pharmacy. Needs to make lab appt.

## 2016-06-25 NOTE — Telephone Encounter (Signed)
-----   Message from Nori Riis, PA-C sent at 06/25/2016  8:45 AM EST ----- Patient's PSA continues to rise.  I have spoken to the patient concerning this issue.  I expressed to the patient that it may be a sign that he has prostate cancer.  We discussed his age and the fact that if this is prostate cancer, the treatment would be geared to the quality of life and not curative.  The patient states that he feels great.  He does not want to pursue any biopsies or imaging studies at this time.  He wanted to know if he could increase his finasteride.  I offer to switch the medication to dutasteride and repeat the PSA in one month.  Would you send the script in and schedule the lab appointment?

## 2016-06-29 ENCOUNTER — Telehealth: Payer: Self-pay

## 2016-06-29 NOTE — Telephone Encounter (Signed)
PA for dutasteride has been APPROVED.

## 2016-07-07 ENCOUNTER — Other Ambulatory Visit: Payer: Medicare Other

## 2016-07-07 DIAGNOSIS — N400 Enlarged prostate without lower urinary tract symptoms: Secondary | ICD-10-CM

## 2016-07-08 LAB — PSA: Prostate Specific Ag, Serum: 7.8 ng/mL — ABNORMAL HIGH (ref 0.0–4.0)

## 2016-07-10 ENCOUNTER — Emergency Department
Admission: EM | Admit: 2016-07-10 | Discharge: 2016-07-10 | Disposition: A | Discharge status: Home / Self Care | Payer: Medicare Other | Attending: Emergency Medicine | Admitting: Emergency Medicine

## 2016-07-10 ENCOUNTER — Emergency Department: Payer: Medicare Other

## 2016-07-10 ENCOUNTER — Encounter: Payer: Self-pay | Admitting: Emergency Medicine

## 2016-07-10 DIAGNOSIS — I10 Essential (primary) hypertension: Secondary | ICD-10-CM | POA: Diagnosis not present

## 2016-07-10 DIAGNOSIS — R51 Headache: Secondary | ICD-10-CM | POA: Insufficient documentation

## 2016-07-10 DIAGNOSIS — Z87891 Personal history of nicotine dependence: Secondary | ICD-10-CM | POA: Insufficient documentation

## 2016-07-10 DIAGNOSIS — R42 Dizziness and giddiness: Secondary | ICD-10-CM | POA: Diagnosis not present

## 2016-07-10 DIAGNOSIS — I2510 Atherosclerotic heart disease of native coronary artery without angina pectoris: Secondary | ICD-10-CM | POA: Diagnosis not present

## 2016-07-10 DIAGNOSIS — Z79899 Other long term (current) drug therapy: Secondary | ICD-10-CM | POA: Diagnosis not present

## 2016-07-10 LAB — URINALYSIS, COMPLETE (UACMP) WITH MICROSCOPIC
BACTERIA UA: NONE SEEN
Bilirubin Urine: NEGATIVE
GLUCOSE, UA: NEGATIVE mg/dL
Hgb urine dipstick: NEGATIVE
Ketones, ur: NEGATIVE mg/dL
NITRITE: NEGATIVE
PROTEIN: NEGATIVE mg/dL
Specific Gravity, Urine: 1.016 (ref 1.005–1.030)
pH: 5 (ref 5.0–8.0)

## 2016-07-10 LAB — CBC
HEMATOCRIT: 47.4 % (ref 40.0–52.0)
Hemoglobin: 15.9 g/dL (ref 13.0–18.0)
MCH: 28.4 pg (ref 26.0–34.0)
MCHC: 33.5 g/dL (ref 32.0–36.0)
MCV: 84.8 fL (ref 80.0–100.0)
PLATELETS: 266 10*3/uL (ref 150–440)
RBC: 5.6 MIL/uL (ref 4.40–5.90)
RDW: 14.2 % (ref 11.5–14.5)
WBC: 14.1 10*3/uL — ABNORMAL HIGH (ref 3.8–10.6)

## 2016-07-10 LAB — BASIC METABOLIC PANEL
Anion gap: 6 (ref 5–15)
BUN: 15 mg/dL (ref 6–20)
CALCIUM: 9.7 mg/dL (ref 8.9–10.3)
CO2: 27 mmol/L (ref 22–32)
CREATININE: 1.51 mg/dL — AB (ref 0.61–1.24)
Chloride: 101 mmol/L (ref 101–111)
GFR calc Af Amer: 47 mL/min — ABNORMAL LOW (ref >60)
GFR, EST NON AFRICAN AMERICAN: 41 mL/min — AB (ref >60)
GLUCOSE: 141 mg/dL — AB (ref 65–99)
Potassium: 5.5 mmol/L — ABNORMAL HIGH (ref 3.5–5.1)
Sodium: 134 mmol/L — ABNORMAL LOW (ref 135–145)

## 2016-07-10 MED ORDER — IBUPROFEN 400 MG PO TABS
400.0000 mg | ORAL_TABLET | Freq: Once | ORAL | Status: AC
  Administered 2016-07-10: 400 mg via ORAL
  Filled 2016-07-10: qty 1

## 2016-07-10 MED ORDER — CYCLOBENZAPRINE HCL 10 MG PO TABS
10.0000 mg | ORAL_TABLET | Freq: Once | ORAL | Status: AC
  Administered 2016-07-10: 10 mg via ORAL
  Filled 2016-07-10: qty 1

## 2016-07-10 MED ORDER — CYCLOBENZAPRINE HCL 10 MG PO TABS: 10.0000 mg | ORAL_TABLET | Freq: Three times a day (TID) | ORAL | 0 refills | Status: DC | PRN

## 2016-07-10 MED ORDER — MECLIZINE HCL 25 MG PO TABS: 25.0000 mg | ORAL_TABLET | Freq: Three times a day (TID) | ORAL | 0 refills | Status: DC | PRN

## 2016-07-10 MED ORDER — IBUPROFEN 400 MG PO TABS: 800.0000 mg | ORAL_TABLET | Freq: Three times a day (TID) | ORAL | 0 refills | Status: DC | PRN

## 2016-07-10 MED ORDER — MECLIZINE HCL 25 MG PO TABS
25.0000 mg | ORAL_TABLET | Freq: Once | ORAL | Status: AC
  Administered 2016-07-10: 25 mg via ORAL
  Filled 2016-07-10: qty 1

## 2016-07-10 NOTE — ED Notes (Signed)
Patient transported to CT 

## 2016-07-10 NOTE — Discharge Instructions (Signed)
You were evaluated for dizziness and headache, and although no certain cause was found, your exam and evaluation are reassuring in the emergency department today.  As you discuss, your neck soreness appears to be due to muscle tension, and your prescribed muscle relaxer and ibuprofen, for one week. Your dizziness and headache evaluation are reassuring and as we discussed I am most suspicious of possible vertigo and your being treated with meclizine medicine for that.  We discussed your CT scan was negative for stroke, but I'd recommend 81mg  aspirin daily until completely evaluation again with your primary doctor.  Return to the emergency department immediately for any worsening symptoms including new or worsening headache, any vision problems, facial drooping, trouble with speech or finding your words, weakness, numbness, or any other symptoms concerning to you.

## 2016-07-10 NOTE — ED Triage Notes (Signed)
C/O dizziness for 2-3 days and headache started last night.  C/O left forehead pain.

## 2016-07-10 NOTE — ED Provider Notes (Signed)
[facility] Emergency Department Provider Note ____________________________________________   I have reviewed the triage vital signs and the triage nursing note.  HISTORY  Chief Complaint Dizziness and Headache   Historian Patient   HPI Kayden Gustafson is a 83 y.o. male presents stating that he had gradual onset mild left frontal headache yesterday which has been somewhat persistent although is less significant today. He has also had some lightheadedness or dizziness over the past 3 or 4 days. Denies problems with balance. No reported focal weakness or numbness. No facial droop. No speech problems. No recent illnesses. Symptoms are mild at present.  Denies cardiac symptoms. Does not take aspirin daily.  Reports soreness across the top of his shoulders bilaterally.    Past Medical History:  Diagnosis Date  . Arthritis   . Benign enlargement of prostate   . CAD (coronary artery disease)   . Constipation   . Elevated PSA   . GERD (gastroesophageal reflux disease)   . Hypertension   . Incomplete bladder emptying   . Nocturia   . Urgency of micturation   . Urinary frequency     Patient Active Problem List   Diagnosis Date Noted  . Arthritis 06/11/2016  . History of alcohol abuse 06/11/2016  . Hx of colonic polyp 06/11/2016  . Hypertension 06/11/2016  . Internal hemorrhoids 06/11/2016  . Elevated PSA 05/16/2015  . BPH (benign prostatic hyperplasia) 11/22/2014  . Incomplete bladder emptying 11/22/2014  . GERD (gastroesophageal reflux disease) 05/09/2008    Past Surgical History:  Procedure Laterality Date  . GALLBLADDER SURGERY    . PROSTATE SURGERY    . STOMACH SURGERY      Prior to Admission medications   Medication Sig Start Date End Date Taking? Authorizing Provider  atenolol (TENORMIN) 100 MG tablet Take 100 mg by mouth daily.    Yes Historical Provider, MD  finasteride (PROSCAR) 5 MG tablet Take 1 tablet (5 mg total) by mouth daily.  06/11/16  Yes Shannon A McGowan, PA-C  nortriptyline (PAMELOR) 25 MG capsule Reported on 05/16/2015 10/30/14  Yes Historical Provider, MD  omeprazole (PRILOSEC) 20 MG capsule Take 20 mg by mouth daily.   Yes Historical Provider, MD  polyethylene glycol powder (GLYCOLAX/MIRALAX) powder Take by mouth. 05/08/15  Yes Historical Provider, MD  tamsulosin (FLOMAX) 0.4 MG CAPS capsule Take 1 capsule (0.4 mg total) by mouth daily. 06/11/16  Yes Shannon A McGowan, PA-C  Telmisartan-Amlodipine 80-5 MG TABS Take by mouth daily.   Yes Historical Provider, MD  amlodipine-atorvastatin (CADUET) 5-80 MG tablet Take 1 tablet by mouth daily.    Historical Provider, MD  cyclobenzaprine (FLEXERIL) 10 MG tablet Take 1 tablet (10 mg total) by mouth 3 (three) times daily as needed for muscle spasms. 07/10/16   Lisa Roca, MD  dutasteride (AVODART) 0.5 MG capsule Take 1 capsule (0.5 mg total) by mouth daily. Patient not taking: Reported on 07/10/2016 06/25/16   Larene Beach A McGowan, PA-C  ibuprofen (ADVIL,MOTRIN) 400 MG tablet Take 2 tablets (800 mg total) by mouth every 8 (eight) hours as needed. 07/10/16   Lisa Roca, MD  meclizine (ANTIVERT) 25 MG tablet Take 1 tablet (25 mg total) by mouth 3 (three) times daily as needed for dizziness or nausea. 07/10/16   Lisa Roca, MD    No Known Allergies  Family History  Problem Relation Age of Onset  . Hypertension Mother   . Stroke Father   . Kidney disease Neg Hx   . Prostate cancer Neg Hx  Social History Social History  Substance Use Topics  . Smoking status: Former Research scientist (life sciences)  . Smokeless tobacco: Not on file     Comment: quit in 2000  . Alcohol use No    Review of Systems  Constitutional: Negative for fever. Eyes: Negative for visual changes. ENT: Negative for sore throat. Cardiovascular: Negative for chest pain. Respiratory: Negative for shortness of breath. Gastrointestinal: Negative for abdominal pain, vomiting and diarrhea. Genitourinary: Negative for  dysuria. Musculoskeletal: Negative for back pain. Skin: Negative for rash. Neurological: Mild left-sided headache as per history of present illness, currently minimal.  Denies room spinning per se 10 point Review of Systems otherwise negative ____________________________________________   PHYSICAL EXAM:  VITAL SIGNS: ED Triage Vitals  Enc Vitals Group     BP 07/10/16 1013 110/62     Pulse Rate 07/10/16 1013 67     Resp 07/10/16 1013 16     Temp 07/10/16 1013 98.1 F (36.7 C)     Temp Source 07/10/16 1013 Oral     SpO2 07/10/16 1013 97 %     Weight 07/10/16 1020 197 lb (89.4 kg)     Height 07/10/16 1020 5\' 9"  (1.753 m)     Head Circumference --      Peak Flow --      Pain Score 07/10/16 1021 7     Pain Loc --      Pain Edu? --      Excl. in [place]? --      Constitutional: Alert and oriented. Well appearing and in no distress. HEENT   Head: Normocephalic and atraumatic.      Eyes: Conjunctivae are normal. PERRL. Normal extraocular movements.      Ears:         Nose: No congestion/rhinnorhea.   Mouth/Throat: Mucous membranes are moist.   Neck: No stridor.  No neck stiffness. He does have tightness of the muscles of the trapezius bilaterally which are somewhat sore to palpation. Cardiovascular/Chest: Normal rate, regular rhythm.  No murmurs, rubs, or gallops. Respiratory: Normal respiratory effort without tachypnea nor retractions. Breath sounds are clear and equal bilaterally. No wheezes/rales/rhonchi. Gastrointestinal: Soft. No distention, no guarding, no rebound. Nontender.    Genitourinary/rectal:Deferred Musculoskeletal: Nontender with normal range of motion in all extremities. No joint effusions.  No lower extremity tenderness.  No edema. Neurologic:  Normal speech and language. No facial droop. No aphasia. 5 out of 5 strength in 4 extremities. Finger-nose intact bilaterally. No gross or focal neurologic deficits are appreciated. Skin:  Skin is warm, dry and  intact. No rash noted. Psychiatric: Mood and affect are normal. Speech and behavior are normal. Patient exhibits appropriate insight and judgment.   ____________________________________________  LABS (pertinent positives/negatives)  Labs Reviewed  BASIC METABOLIC PANEL - Abnormal; Notable for the following:       Result Value   Sodium 134 (*)    Potassium 5.5 (*)    Glucose, Bld 141 (*)    Creatinine, Ser 1.51 (*)    GFR calc non Af Amer 41 (*)    GFR calc Af Amer 47 (*)    All other components within normal limits  CBC - Abnormal; Notable for the following:    WBC 14.1 (*)    All other components within normal limits  URINALYSIS, COMPLETE (UACMP) WITH MICROSCOPIC - Abnormal; Notable for the following:    Color, Urine YELLOW (*)    APPearance CLEAR (*)    Leukocytes, UA TRACE (*)    Squamous Epithelial /  LPF 0-5 (*)    All other components within normal limits  CBG MONITORING, ED    ____________________________________________    EKG I, Lisa Roca, MD, the attending physician have personally viewed and interpreted all ECGs.  66 bpm. Normal sinus rhythm. Narrow QRS. Normal axis. Normal ST and T-wave ____________________________________________  RADIOLOGY All Xrays were viewed by me. Imaging interpreted by Radiologist.  CT head without contrast:  IMPRESSION: 1. No evidence of acute intracranial abnormality. 2. Mild chronic small vessel ischemic disease. __________________________________________  PROCEDURES  Procedure(s) performed: None  Critical Care performed: None  ____________________________________________   ED COURSE / ASSESSMENT AND PLAN  Pertinent labs & imaging results that were available during my care of the patient were reviewed by me and considered in my medical decision making (see chart for details).   Mr. Defenbaugh is here for evaluation of dizziness for a few days, and mild left-sided headache which is now actually improved. The rest of  his neurologic exam is intact without focal deficit. I think symptoms are less likely to be acute stroke, but given these are unusual symptoms for him, I did recommend CT of the head and this was performed and negative/reassuring.  Symptoms may actually be related to vertigo which I discussed with the family. Alternatively, we did discuss the possible slight numbness rate of acute stroke with CT and option of pursuing MRI, although I think this is unlikely in this clinical scenario. I did recommend go-ahead starting on 81 mg aspirin. For the muscle spasms in the trapezius is going take troponin cycled Benzedrine. For vertigo he may try meclizine.  He is to follow-up with primary care doctor early next week. We discussed return precautions and he is metacarpal with this plan.    CONSULTATIONS:   None   Patient / Family / Caregiver informed of clinical course, medical decision-making process, and agree with plan.   I discussed return precautions, follow-up instructions, and discharge instructions with patient and/or family.   ___________________________________________   FINAL CLINICAL IMPRESSION(S) / ED DIAGNOSES   Final diagnoses:  Dizziness              Note: This dictation was prepared with Dragon dictation. Any transcriptional errors that result from this process are unintentional    Lisa Roca, MD 07/10/16 1739

## 2016-07-17 ENCOUNTER — Telehealth: Payer: Self-pay

## 2016-07-17 DIAGNOSIS — R972 Elevated prostate specific antigen [PSA]: Secondary | ICD-10-CM

## 2016-07-17 NOTE — Telephone Encounter (Signed)
-----   Message from Nori Riis, PA-C sent at 07/17/2016  8:22 AM EST ----- PSA has declined.  I suggest follow up in 6 months with PSA prior to the appointment.

## 2016-07-17 NOTE — Telephone Encounter (Signed)
Spoke with pt in reference to PSA results. Made aware will need a 19mo f/u with labs. Pt voiced understanding. Pt transferred to the front to make f/u appts. Orders placed.

## 2016-07-17 NOTE — Telephone Encounter (Signed)
LMOM

## 2016-07-27 ENCOUNTER — Other Ambulatory Visit: Payer: Self-pay

## 2017-01-06 ENCOUNTER — Other Ambulatory Visit: Payer: Medicare Other

## 2017-01-06 DIAGNOSIS — R972 Elevated prostate specific antigen [PSA]: Secondary | ICD-10-CM

## 2017-01-07 ENCOUNTER — Other Ambulatory Visit: Payer: Medicare Other

## 2017-01-07 LAB — PSA: PROSTATE SPECIFIC AG, SERUM: 86.8 ng/mL — AB (ref 0.0–4.0)

## 2017-01-08 ENCOUNTER — Other Ambulatory Visit: Payer: Self-pay | Admitting: Neurology

## 2017-01-08 DIAGNOSIS — R42 Dizziness and giddiness: Secondary | ICD-10-CM

## 2017-01-13 ENCOUNTER — Ambulatory Visit
Admission: RE | Admit: 2017-01-13 | Discharge: 2017-01-13 | Disposition: A | Discharge status: Home / Self Care | Payer: Medicare Other | Source: Ambulatory Visit | Attending: Neurology | Admitting: Neurology

## 2017-01-13 DIAGNOSIS — R42 Dizziness and giddiness: Secondary | ICD-10-CM | POA: Diagnosis present

## 2017-01-13 NOTE — Progress Notes (Signed)
8:52 AM   Matthew Hunter [birth year] [id number]  Referring provider: Maryland Pink, MD [address] [facility] [place], [place] [address]  Chief Complaint  Patient presents with  . Benign Prostatic Hypertrophy    6 month follow up   . Elevated PSA    HPI: Patient is an 83 year old African-American male with a history of elevated PSA and BPH with LUTS who presents today for 6 month follow-up.  Elevated PSA Patient's PSA is currently 86.8 ng/mL on 01/06/2017.    He is on finasteride at this time.    BPH WITH LUTS His IPSS score today is 11, which is moderate lower urinary tract symptomatology. He is mostly satisfied with his quality life due to his urinary symptoms.  His previous IPSS score was 11/2.  His major complaints are frequency and intermittency.  He denies any dysuria, hematuria or suprapubic pain.  He currently taking tamsulosin and finasteride.  His has had TUMT about 7 years ago.  He also denies any recent fevers, chills, nausea or vomiting.  He does not have a family history of PCa.      IPSS    Row Name 01/14/17 0800         International Prostate Symptom Score   How often have you had the sensation of not emptying your bladder? Less than 1 in 5     How often have you had to urinate less than every two hours? Less than half the time     How often have you found you stopped and started again several times when you urinated? Less than half the time     How often have you found it difficult to postpone urination? Less than 1 in 5 times     How often have you had a weak urinary stream? Less than half the time     How often have you had to strain to start urination? Less than 1 in 5 times     How many times did you typically get up at night to urinate? 2 Times     Total IPSS Score 11       Quality of Life due to urinary symptoms   If you were to spend the rest of your life with your urinary condition just the way it is now how would you feel about that?  Mostly Satisfied        Score:  1-7 Mild 8-19 Moderate 20-35 Severe   PMH: Past Medical History:  Diagnosis Date  . Arthritis   . Benign enlargement of prostate   . CAD (coronary artery disease)   . Constipation   . Elevated PSA   . GERD (gastroesophageal reflux disease)   . Hypertension   . Incomplete bladder emptying   . Nocturia   . Urgency of micturation   . Urinary frequency     Surgical History: Past Surgical History:  Procedure Laterality Date  . GALLBLADDER SURGERY    . PROSTATE SURGERY    . STOMACH SURGERY      Home Medications:  Allergies as of 01/14/2017   No Known Allergies     Medication List       Accurate as of 01/14/17  8:52 AM. Always use your most recent med list.          amlodipine-atorvastatin 5-80 MG tablet Commonly known as:  CADUET Take 1 tablet by mouth daily.   aspirin EC 81 MG tablet Take by mouth.   atenolol 100  MG tablet Commonly known as:  TENORMIN Take 100 mg by mouth daily.   cyclobenzaprine 10 MG tablet Commonly known as:  FLEXERIL Take 1 tablet (10 mg total) by mouth 3 (three) times daily as needed for muscle spasms.   diazepam 2 MG tablet Commonly known as:  VALIUM Take 1/2 to 1 tablet as needed for dizziness, up to every 8 hours. Use with caution, as will make drowsy.   dutasteride 0.5 MG capsule Commonly known as:  AVODART Take 1 capsule (0.5 mg total) by mouth daily.   finasteride 5 MG tablet Commonly known as:  PROSCAR Take 1 tablet (5 mg total) by mouth daily.   ibuprofen 400 MG tablet Commonly known as:  ADVIL,MOTRIN Take 2 tablets (800 mg total) by mouth every 8 (eight) hours as needed.   meclizine 25 MG tablet Commonly known as:  ANTIVERT Take 1 tablet (25 mg total) by mouth 3 (three) times daily as needed for dizziness or nausea.   neomycin-polymyxin b-dexamethasone 3.5-10000-0.1 Oint Commonly known as:  MAXITROL apply A SMALL AMOUNT three times a day into left eye for 2 weeks     nortriptyline 25 MG capsule Commonly known as:  PAMELOR Reported on 05/16/2015   omeprazole 20 MG capsule Commonly known as:  PRILOSEC Take 20 mg by mouth daily.   polyethylene glycol powder powder Commonly known as:  GLYCOLAX/MIRALAX Take by mouth.   PYRIDOXINE HCL PO Take by mouth.   tamsulosin 0.4 MG Caps capsule Commonly known as:  FLOMAX Take 1 capsule (0.4 mg total) by mouth daily.   Telmisartan-Amlodipine 80-5 MG Tabs Take by mouth daily.   traMADol 50 MG tablet Commonly known as:  ULTRAM Take by mouth.       Allergies: No Known Allergies  Family History: Family History  Problem Relation Age of Onset  . Hypertension Mother   . Stroke Father   . Kidney disease Neg Hx   . Prostate cancer Neg Hx   . Kidney cancer Neg Hx   . Bladder Cancer Neg Hx     Social History:  reports that he quit smoking about 18 years ago. He has never used smokeless tobacco. He reports that he uses drugs about 2 times per week. He reports that he does not drink alcohol.  ROS: UROLOGY Frequent Urination?: No Hard to postpone urination?: No Burning/pain with urination?: No Get up at night to urinate?: Yes Leakage of urine?: No Urine stream starts and stops?: Yes Trouble starting stream?: No Do you have to strain to urinate?: No Blood in urine?: No Urinary tract infection?: No Sexually transmitted disease?: No Injury to kidneys or bladder?: No Painful intercourse?: No Weak stream?: No Erection problems?: No Penile pain?: No Gastrointestinal Nausea?: No Vomiting?: No Indigestion/heartburn?: No Diarrhea?: No Constipation?: Yes Constitutional Fever: No Night sweats?: No Weight loss?: No Fatigue?: No Skin Skin rash/lesions?: No Itching?: No Eyes Blurred vision?: No Double vision?: No Ears/Nose/Throat Sore throat?: No Sinus problems?: No Hematologic/Lymphatic Swollen glands?: No Easy bruising?: No Cardiovascular Leg swelling?: No Chest pain?:  No Respiratory Cough?: No Shortness of breath?: No Endocrine Excessive thirst?: No Musculoskeletal Back pain?: Yes Joint pain?: No Neurological Headaches?: No Dizziness?: Yes Psychologic Depression?: No Anxiety?: No   Physical Exam: BP 135/75   Pulse 64   Ht 5\' 9"  (1.753 m)   Wt 190 lb 8 oz (86.4 kg)   BMI 28.13 kg/m   GU: Patient with uncircumcised phallus. Foreskin easily retracted  Urethral meatus is patent.  No penile discharge.  No penile lesions or rashes. Scrotum without lesions, cysts, rashes and/or edema.  Testicles are located scrotally bilaterally. No masses are appreciated in the testicles. Left and right epididymis are normal. Rectal: Patient with  normal sphincter tone. Perineum without scarring or rashes. No rectal masses are appreciated. Prostate is approximately 60 grams, hard nodule in the left lobe (10 mm x 5 mm) is appreciated. Seminal vesicles are normal.  Laboratory Data: PSA History:     2.66 ng/mL on 11/17/2011     4.54 ng/mL on 12/07/2013     3.3 ng/mL on 04/09/2014     4.3 ng/mL on 10/2014     6.3 ng/mL on 04/2015    7.1 ng/mL on 11/07/2015 Results for orders placed or performed in visit on 01/06/17  PSA  Result Value Ref Range   Prostate Specific Ag, Serum 86.8 (H) 0.0 - 4.0 ng/mL   Lab Results  Component Value Date   WBC 14.1 (H) 07/10/2016   HGB 15.9 07/10/2016   HCT 47.4 07/10/2016   MCV 84.8 07/10/2016   PLT 266 07/10/2016    Lab Results  Component Value Date   CREATININE 1.51 (H) 07/10/2016     Assessment & Plan:    1. Elevated PSA/Prostate cancer   - Patient's most recent PSA of 86.8 on 01/06/2017  - I explained to the patient that due to this extreme elevation in his PSA and prostate nodule we can diagnose him  clinically with prostate cancer - we do not need to pursue a prostate biopsy at this time, but I would like to pursue imaging studies (bone scan and CT scan) to evaluate for metastases.  I explained to the patient that  I am concerned that he is at risk for pathological fractures and it is important to undergo these studies.        - Offered to start ADT therapy with [name] - explained the risk of increased risk of heart disease, fatigue, bone density loss and loss of muscle - encouraged patient to start taking calcium 600 mg bid and vitamin D 200 mg bid - he is wanting to proceed with ADT  - RTC to review scans and start Frimagon  2. BPH with LUTS  - IPSS score is 11/2, it is stable  - Continue conservative management, avoiding bladder irritants and timed voiding's  - Continue tamsulosin 0.4 mg daily and finasteride 5 mg daily for now   [name], PA-C  [facility] [address], [place], [place] [address] [phone]

## 2017-01-14 ENCOUNTER — Ambulatory Visit: Payer: Medicare Other | Admitting: Urology

## 2017-01-14 ENCOUNTER — Encounter: Payer: Self-pay | Admitting: Urology

## 2017-01-14 VITALS — BP 135/75 | HR 64 | Ht 69.0 in | Wt 190.5 lb

## 2017-01-14 DIAGNOSIS — N138 Other obstructive and reflux uropathy: Secondary | ICD-10-CM | POA: Diagnosis not present

## 2017-01-14 DIAGNOSIS — C61 Malignant neoplasm of prostate: Secondary | ICD-10-CM

## 2017-01-14 DIAGNOSIS — N401 Enlarged prostate with lower urinary tract symptoms: Secondary | ICD-10-CM | POA: Diagnosis not present

## 2017-01-14 DIAGNOSIS — R972 Elevated prostate specific antigen [PSA]: Secondary | ICD-10-CM | POA: Diagnosis not present

## 2017-01-14 DIAGNOSIS — N402 Nodular prostate without lower urinary tract symptoms: Secondary | ICD-10-CM | POA: Diagnosis not present

## 2017-01-15 LAB — BUN+CREAT
BUN/Creatinine Ratio: 10 (ref 10–24)
BUN: 11 mg/dL (ref 8–27)
Creatinine, Ser: 1.05 mg/dL (ref 0.76–1.27)
GFR, EST AFRICAN AMERICAN: 75 mL/min/{1.73_m2} (ref >59)
GFR, EST NON AFRICAN AMERICAN: 65 mL/min/{1.73_m2} (ref >59)

## 2017-01-21 ENCOUNTER — Telehealth: Payer: Self-pay | Admitting: Urology

## 2017-01-21 NOTE — Telephone Encounter (Signed)
Do you know if we are able to proceed with Firmagon injections with this patient?

## 2017-01-25 NOTE — Telephone Encounter (Signed)
After looking at his [name] form from insurance it says he is covered and so yes he can have it done.   Sharyn Lull

## 2017-01-25 NOTE — Telephone Encounter (Signed)
Let's get him scheduled for a Firmagon injection this week.

## 2017-01-28 NOTE — Telephone Encounter (Signed)
LMOM

## 2017-01-29 ENCOUNTER — Ambulatory Visit (INDEPENDENT_AMBULATORY_CARE_PROVIDER_SITE_OTHER): Payer: Medicare Other

## 2017-01-29 DIAGNOSIS — C61 Malignant neoplasm of prostate: Secondary | ICD-10-CM

## 2017-01-29 MED ORDER — DEGARELIX ACETATE 120 MG ~~LOC~~ SOLR
240.0000 mg | Freq: Once | SUBCUTANEOUS | Status: AC
  Administered 2017-01-29: 240 mg via SUBCUTANEOUS

## 2017-01-29 NOTE — Progress Notes (Signed)
Firmagon Sub Q Injection  Due to Prostate Cancer patient is present today for a Firmagon Injection.   Medication: [name] (Degarelix)  Dose: 240mg  Location: right and left upper abdomen Lot: [id number] Exp: 03/2018  Patient tolerated well, no complications were noted  Performed by: Toniann Fail, LPN   Follow up: pt f/u next week with Larene Beach

## 2017-01-29 NOTE — Telephone Encounter (Signed)
Spoke with pt in reference to firmagon injection. Pt is on the way now.

## 2017-02-03 ENCOUNTER — Encounter
Admission: RE | Admit: 2017-02-03 | Discharge: 2017-02-03 | Disposition: A | Discharge status: Home / Self Care | Payer: Medicare Other | Source: Ambulatory Visit | Attending: Urology | Admitting: Urology

## 2017-02-03 ENCOUNTER — Ambulatory Visit
Admission: RE | Admit: 2017-02-03 | Discharge: 2017-02-03 | Disposition: A | Discharge status: Home / Self Care | Payer: Medicare Other | Source: Ambulatory Visit | Attending: Urology | Admitting: Urology

## 2017-02-03 DIAGNOSIS — N200 Calculus of kidney: Secondary | ICD-10-CM | POA: Diagnosis not present

## 2017-02-03 DIAGNOSIS — N402 Nodular prostate without lower urinary tract symptoms: Secondary | ICD-10-CM | POA: Diagnosis present

## 2017-02-03 DIAGNOSIS — C61 Malignant neoplasm of prostate: Secondary | ICD-10-CM

## 2017-02-03 DIAGNOSIS — I700 Atherosclerosis of aorta: Secondary | ICD-10-CM | POA: Diagnosis not present

## 2017-02-03 DIAGNOSIS — R972 Elevated prostate specific antigen [PSA]: Secondary | ICD-10-CM | POA: Insufficient documentation

## 2017-02-03 MED ORDER — IOPAMIDOL (ISOVUE-300) INJECTION 61%
100.0000 mL | Freq: Once | INTRAVENOUS | Status: AC | PRN
  Administered 2017-02-03: 100 mL via INTRAVENOUS

## 2017-02-03 MED ORDER — TECHNETIUM TC 99M MEDRONATE IV KIT
25.0000 | PACK | Freq: Once | INTRAVENOUS | Status: AC | PRN
  Administered 2017-02-03: 22.96 via INTRAVENOUS

## 2017-02-15 ENCOUNTER — Telehealth: Payer: Self-pay | Admitting: Urology

## 2017-02-15 NOTE — Telephone Encounter (Signed)
-----   Message from Nori Riis, PA-C sent at 02/05/2017  8:03 AM EDT ----- Mr. Roback needs an office visit for his Firmagon injection and to discuss his imaging reports.

## 2017-02-15 NOTE — Progress Notes (Signed)
2:20 PM   Matthew Hunter 10-Jan-1930 [id number]  Referring provider: Maryland Pink, MD [address] [facility] [place], [place] [address]  Chief Complaint  Patient presents with  . Results    CT / Bone Scan  . Prostate Cancer    [name]    HPI: Patient is an 83 year old African-American male with clinical prostate cancer who presents today to discuss CT and bone scan results.     Clinical prostate cancer Patient's PSA is currently 86.8 ng/mL on 01/06/2017.    He is on finasteride at this time.  Contrast CT noted no evidence of metastatic disease.  Left adrenal nodule is indeterminate by relative washout characteristics but appears grossly stable in size from 02/28/2007, favoring a benign adenoma.  Left renal stone.  Aortic atherosclerosis.  Bone scan noted multiple abnormal foci of with of increased osseous tracer accumulation compatible with widespread osseous metastatic disease.    BPH WITH LUTS His IPSS score is 11, which is moderate lower urinary tract symptomatology. He is mostly satisfied with his quality life due to his urinary symptoms.  His previous IPSS score was 11/2.  His major complaints are frequency and intermittency.  He denies any dysuria, hematuria or suprapubic pain.  He currently taking tamsulosin and finasteride.  His has had TUMT about 7 years ago.  He also denies any recent fevers, chills, nausea or vomiting.  He does not have a family history of PCa.      IPSS    Row Name 01/14/17 0800         International Prostate Symptom Score   How often have you had the sensation of not emptying your bladder? Less than 1 in 5     How often have you had to urinate less than every two hours? Less than half the time     How often have you found you stopped and started again several times when you urinated? Less than half the time     How often have you found it difficult to postpone urination? Less than 1 in 5 times     How often have you had a weak urinary  stream? Less than half the time     How often have you had to strain to start urination? Less than 1 in 5 times     How many times did you typically get up at night to urinate? 2 Times     Total IPSS Score 11       Quality of Life due to urinary symptoms   If you were to spend the rest of your life with your urinary condition just the way it is now how would you feel about that? Mostly Satisfied        Score:  1-7 Mild 8-19 Moderate 20-35 Severe   PMH: Past Medical History:  Diagnosis Date  . Arthritis   . Benign enlargement of prostate   . CAD (coronary artery disease)   . Cancer ([place])   . Constipation   . Elevated PSA   . GERD (gastroesophageal reflux disease)   . Hypertension   . Incomplete bladder emptying   . Nocturia   . Urgency of micturation   . Urinary frequency     Surgical History: Past Surgical History:  Procedure Laterality Date  . GALLBLADDER SURGERY    . PROSTATE SURGERY    . STOMACH SURGERY      Home Medications:  Allergies as of 02/16/2017   No Known Allergies  Medication List       Accurate as of 02/16/17  2:20 PM. Always use your most recent med list.          amlodipine-atorvastatin 5-80 MG tablet Commonly known as:  CADUET Take 1 tablet by mouth daily.   aspirin EC 81 MG tablet Take by mouth.   atenolol 100 MG tablet Commonly known as:  TENORMIN Take 100 mg by mouth daily.   cyclobenzaprine 10 MG tablet Commonly known as:  FLEXERIL Take 1 tablet (10 mg total) by mouth 3 (three) times daily as needed for muscle spasms.   diazepam 2 MG tablet Commonly known as:  VALIUM Take 1/2 to 1 tablet as needed for dizziness, up to every 8 hours. Use with caution, as will make drowsy.   dutasteride 0.5 MG capsule Commonly known as:  AVODART Take 1 capsule (0.5 mg total) by mouth daily.   finasteride 5 MG tablet Commonly known as:  PROSCAR Take 1 tablet (5 mg total) by mouth daily.   ibuprofen 400 MG tablet Commonly known as:   ADVIL,MOTRIN Take 2 tablets (800 mg total) by mouth every 8 (eight) hours as needed.   meclizine 25 MG tablet Commonly known as:  ANTIVERT Take 1 tablet (25 mg total) by mouth 3 (three) times daily as needed for dizziness or nausea.   neomycin-polymyxin b-dexamethasone 3.5-10000-0.1 Oint Commonly known as:  MAXITROL apply A SMALL AMOUNT three times a day into left eye for 2 weeks   nortriptyline 25 MG capsule Commonly known as:  PAMELOR Reported on 05/16/2015   omeprazole 20 MG capsule Commonly known as:  PRILOSEC Take 20 mg by mouth daily.   polyethylene glycol powder powder Commonly known as:  GLYCOLAX/MIRALAX Take by mouth.   PYRIDOXINE HCL PO Take by mouth.   tamsulosin 0.4 MG Caps capsule Commonly known as:  FLOMAX Take 1 capsule (0.4 mg total) by mouth daily.   Telmisartan-Amlodipine 80-5 MG Tabs Take by mouth daily.   traMADol 50 MG tablet Commonly known as:  ULTRAM Take by mouth.       Allergies: No Known Allergies  Family History: Family History  Problem Relation Age of Onset  . Hypertension Mother   . Stroke Father   . Kidney disease Neg Hx   . Prostate cancer Neg Hx   . Kidney cancer Neg Hx   . Bladder Cancer Neg Hx     Social History:  reports that he quit smoking about 18 years ago. He has never used smokeless tobacco. He reports that he uses drugs about 2 times per week. He reports that he does not drink alcohol.  ROS: UROLOGY Frequent Urination?: Yes Hard to postpone urination?: Yes Burning/pain with urination?: Yes Get up at night to urinate?: Yes Leakage of urine?: No Urine stream starts and stops?: Yes Trouble starting stream?: No Do you have to strain to urinate?: No Blood in urine?: No Urinary tract infection?: No Sexually transmitted disease?: No Injury to kidneys or bladder?: No Painful intercourse?: No Weak stream?: No Erection problems?: No Penile pain?: No Gastrointestinal Nausea?: No Vomiting?:  No Indigestion/heartburn?: No Diarrhea?: No Constipation?: Yes Constitutional Fever: No Night sweats?: Yes Weight loss?: Yes Fatigue?: No Skin Skin rash/lesions?: No Itching?: No Eyes Blurred vision?: No Double vision?: No Ears/Nose/Throat Sore throat?: No Sinus problems?: No Hematologic/Lymphatic Swollen glands?: No Easy bruising?: No Cardiovascular Leg swelling?: No Chest pain?: No Respiratory Cough?: No Shortness of breath?: No Endocrine Excessive thirst?: No Musculoskeletal Back pain?: Yes Joint pain?: Yes Neurological  Headaches?: No Dizziness?: No Psychologic Depression?: No Anxiety?: No   Physical Exam: BP 109/67   Pulse 61   Ht 5\' 9"  (1.753 m)   Wt 187 lb 14.4 oz (85.2 kg)   BMI 27.75 kg/m   Constitutional: Well nourished. Alert and oriented, No acute distress. HEENT: [place] AT, moist mucus membranes. Trachea midline, no masses. Cardiovascular: No clubbing, cyanosis, or edema. Respiratory: Normal respiratory effort, no increased work of breathing. Skin: No rashes, bruises or suspicious lesions. Lymph: No cervical or inguinal adenopathy. Neurologic: Grossly intact, no focal deficits, moving all 4 extremities. Psychiatric: Normal mood and affect.  Laboratory Data: PSA History:     2.66 ng/mL on 11/17/2011     4.54 ng/mL on 12/07/2013     3.3 ng/mL on 04/09/2014     4.3 ng/mL on 10/2014     6.3 ng/mL on 04/2015    7.1 ng/mL on 11/07/2015 Results for orders placed or performed in visit on 01/14/17  BUN+Creat  Result Value Ref Range   BUN 11 8 - 27 mg/dL   Creatinine, Ser 1.05 0.76 - 1.27 mg/dL   GFR calc non Af Amer 65 >59 mL/min/1.73   GFR calc Af Amer 75 >59 mL/min/1.73   BUN/Creatinine Ratio 10 10 - 24   Lab Results  Component Value Date   WBC 14.1 (H) 07/10/2016   HGB 15.9 07/10/2016   HCT 47.4 07/10/2016   MCV 84.8 07/10/2016   PLT 266 07/10/2016    Lab Results  Component Value Date   CREATININE 1.05 01/14/2017   I have  reviewed the labs  Pertinent imaging CLINICAL DATA:  Prostate cancer, staging.  EXAM: CT ABDOMEN AND PELVIS WITH CONTRAST  TECHNIQUE: Multidetector CT imaging of the abdomen and pelvis was performed using the standard protocol following bolus administration of intravenous contrast.  CONTRAST:  176mL ISOVUE-300 IOPAMIDOL (ISOVUE-300) INJECTION 61%  COMPARISON:  02/28/2007.  FINDINGS: Lower chest: Lung bases show no acute findings. Heart is mildly enlarged. No pericardial or pleural effusion distal esophagus is air-filled.  Hepatobiliary: Subcentimeter low-attenuation lesions in the liver are too small to characterize. Cholecystectomy. No biliary ductal dilatation.  Pancreas: Negative.  Spleen: Negative.  Adrenals/Urinary Tract: Right adrenal gland is unremarkable. Left adrenal gland nodule measures 1.5 cm with 30% relative washout. Size is grossly stable from 02/28/2007. Right kidney is unremarkable. Low-attenuation lesions in the left kidney measure up to 2.0 cm and are likely cysts. A stone is seen in the lower pole left kidney. Ureters are decompressed. Bladder is grossly unremarkable.  Stomach/Bowel: Stomach, small bowel, appendix and colon are unremarkable. Likely fecal material in the rectum, rather than a true polyp.  Vascular/Lymphatic: Atherosclerotic calcification of the arterial vasculature without abdominal aortic aneurysm. No pathologically enlarged lymph nodes.  Reproductive: Prostate is visualized and appears mildly heterogeneous.  Other: No free fluid.  Mesenteries and peritoneum are unremarkable.  Musculoskeletal: No worrisome lytic or sclerotic lesions. Degenerative changes in the spine. Old left rib fracture.  IMPRESSION: 1. No evidence of metastatic disease. 2. Left adrenal nodule is indeterminate by relative washout characteristics but appears grossly stable in size from 02/28/2007, favoring a benign adenoma. 3. Left renal  stone. 4.  Aortic atherosclerosis (ICD10-170.0).   Electronically Signed   By: Lorin Picket M.D.   On: 02/03/2017 13:12  CLINICAL DATA:  Prostate cancer, high risk treatment, low back pain  EXAM: NUCLEAR MEDICINE WHOLE BODY BONE SCAN  TECHNIQUE: Whole body anterior and posterior images were obtained approximately 3 hours after intravenous injection  of radiopharmaceutical.  RADIOPHARMACEUTICALS:  22.96 mCi Technetium-103m MDP IV  COMPARISON:  None  Radiographic correlation:  CT abdomen and pelvis 02/03/2017  FINDINGS: Numerous foci of abnormal osseous tracer accumulation are identified in a pattern most consistent with osseous metastatic disease.  These include anterior and posterior ribs bilaterally, proximal LEFT humeral metaphysis, RIGHT scapula, question LEFT scapula, RIGHT temporal bone, thoracic spine, proximal LEFT femur at the lesser trochanter and intertrochanteric region, in the pelvis, and questionably at the mid to distal LEFT femoral diaphysis.  Focus of abnormal tracer localization LEFT supraclavicular, probably soft tissue, nonspecific; this is opposite these site of probable injection, could represent abnormal lymph node, soft tissue calcification, less likely arising from the anterior aspect of the medial border of the LEFT scapula.  Otherwise expected urinary tract and soft tissue distribution of tracer.  IMPRESSION: Multiple abnormal foci of with of increased osseous tracer accumulation compatible with widespread osseous metastatic disease as above.   Electronically Signed   By: Lavonia Dana M.D.   On: 02/03/2017 14:55  I have independently reviewed the films  Assessment & Plan:    1. Clinical Prostate cancer   - Patient's most recent PSA of 86.8 on 01/06/2017  - I explained to the patient that due to this extreme elevation in his PSA and prostate nodule we can diagnose him clinically with prostate cancer    - 1st Firmagon  received on 01/29/2017 - no side effects experienced   - RTC on 03/04/2017 for 2nd Firmagon  2. Metastasis to the bone  - will refer to the [facility] to evaluation for further treatment to include bone reabsorptive agents if appropriate  3.  BPH with LUTS  - IPSS score is 11/2, it is stable  - Continue conservative management, avoiding bladder irritants and timed voiding's  - Continue tamsulosin 0.4 mg daily and finasteride 5 mg daily for now - may discontinue in the future   [name], [place] [address] [place], [place] [address] [phone]

## 2017-02-15 NOTE — Telephone Encounter (Signed)
App has been made and I had to leave a message for him to call back I mailed the app information to him  Sharyn Lull

## 2017-02-16 ENCOUNTER — Encounter: Payer: Self-pay | Admitting: Urology

## 2017-02-16 ENCOUNTER — Ambulatory Visit: Payer: Medicare Other | Admitting: Urology

## 2017-02-16 VITALS — BP 109/67 | HR 61 | Ht 69.0 in | Wt 187.9 lb

## 2017-02-16 DIAGNOSIS — C61 Malignant neoplasm of prostate: Secondary | ICD-10-CM

## 2017-02-16 DIAGNOSIS — N401 Enlarged prostate with lower urinary tract symptoms: Secondary | ICD-10-CM | POA: Diagnosis not present

## 2017-02-16 DIAGNOSIS — C7951 Secondary malignant neoplasm of bone: Secondary | ICD-10-CM

## 2017-02-16 DIAGNOSIS — N138 Other obstructive and reflux uropathy: Secondary | ICD-10-CM

## 2017-02-23 ENCOUNTER — Ambulatory Visit: Payer: Medicare Other | Admitting: Urology

## 2017-02-24 ENCOUNTER — Inpatient Hospital Stay: Payer: Medicare Other | Attending: Oncology | Admitting: Oncology

## 2017-02-24 ENCOUNTER — Inpatient Hospital Stay: Payer: Medicare Other

## 2017-02-24 ENCOUNTER — Encounter: Payer: Self-pay | Admitting: Oncology

## 2017-02-24 VITALS — BP 148/71 | HR 59 | Temp 97.7°F | Resp 12 | Ht 69.0 in | Wt 185.2 lb

## 2017-02-24 DIAGNOSIS — C61 Malignant neoplasm of prostate: Secondary | ICD-10-CM | POA: Diagnosis not present

## 2017-02-24 DIAGNOSIS — N200 Calculus of kidney: Secondary | ICD-10-CM | POA: Diagnosis not present

## 2017-02-24 DIAGNOSIS — I2510 Atherosclerotic heart disease of native coronary artery without angina pectoris: Secondary | ICD-10-CM | POA: Diagnosis not present

## 2017-02-24 DIAGNOSIS — R972 Elevated prostate specific antigen [PSA]: Secondary | ICD-10-CM

## 2017-02-24 DIAGNOSIS — I700 Atherosclerosis of aorta: Secondary | ICD-10-CM | POA: Diagnosis not present

## 2017-02-24 DIAGNOSIS — K219 Gastro-esophageal reflux disease without esophagitis: Secondary | ICD-10-CM | POA: Diagnosis not present

## 2017-02-24 DIAGNOSIS — M129 Arthropathy, unspecified: Secondary | ICD-10-CM | POA: Insufficient documentation

## 2017-02-24 DIAGNOSIS — M899 Disorder of bone, unspecified: Secondary | ICD-10-CM

## 2017-02-24 DIAGNOSIS — R3915 Urgency of urination: Secondary | ICD-10-CM | POA: Diagnosis not present

## 2017-02-24 DIAGNOSIS — Z7982 Long term (current) use of aspirin: Secondary | ICD-10-CM | POA: Insufficient documentation

## 2017-02-24 DIAGNOSIS — M545 Low back pain: Secondary | ICD-10-CM | POA: Insufficient documentation

## 2017-02-24 DIAGNOSIS — I10 Essential (primary) hypertension: Secondary | ICD-10-CM | POA: Insufficient documentation

## 2017-02-24 DIAGNOSIS — N400 Enlarged prostate without lower urinary tract symptoms: Secondary | ICD-10-CM | POA: Diagnosis not present

## 2017-02-24 DIAGNOSIS — Z87891 Personal history of nicotine dependence: Secondary | ICD-10-CM | POA: Diagnosis not present

## 2017-02-24 DIAGNOSIS — Z79899 Other long term (current) drug therapy: Secondary | ICD-10-CM

## 2017-02-24 DIAGNOSIS — R351 Nocturia: Secondary | ICD-10-CM | POA: Insufficient documentation

## 2017-02-24 LAB — COMPREHENSIVE METABOLIC PANEL
ALBUMIN: 4.1 g/dL (ref 3.5–5.0)
ALT: 17 U/L (ref 17–63)
ANION GAP: 6 (ref 5–15)
AST: 26 U/L (ref 15–41)
Alkaline Phosphatase: 101 U/L (ref 38–126)
BILIRUBIN TOTAL: 1.4 mg/dL — AB (ref 0.3–1.2)
BUN: 17 mg/dL (ref 6–20)
CALCIUM: 10 mg/dL (ref 8.9–10.3)
CO2: 26 mmol/L (ref 22–32)
CREATININE: 1.19 mg/dL (ref 0.61–1.24)
Chloride: 105 mmol/L (ref 101–111)
GFR calc non Af Amer: 54 mL/min — ABNORMAL LOW (ref >60)
Glucose, Bld: 111 mg/dL — ABNORMAL HIGH (ref 65–99)
Potassium: 4.2 mmol/L (ref 3.5–5.1)
SODIUM: 137 mmol/L (ref 135–145)
Total Protein: 8.4 g/dL — ABNORMAL HIGH (ref 6.5–8.1)

## 2017-02-24 LAB — CBC WITH DIFFERENTIAL/PLATELET
BASOS ABS: 0 10*3/uL (ref 0–0.1)
BASOS PCT: 0 %
Eosinophils Absolute: 0.1 10*3/uL (ref 0–0.7)
Eosinophils Relative: 2 %
HEMATOCRIT: 39.6 % — AB (ref 40.0–52.0)
Hemoglobin: 13.6 g/dL (ref 13.0–18.0)
LYMPHS ABS: 2.3 10*3/uL (ref 1.0–3.6)
Lymphocytes Relative: 31 %
MCH: 28.3 pg (ref 26.0–34.0)
MCHC: 34.5 g/dL (ref 32.0–36.0)
MCV: 82.1 fL (ref 80.0–100.0)
MONOS PCT: 9 %
Monocytes Absolute: 0.7 10*3/uL (ref 0.2–1.0)
NEUTROS ABS: 4.3 10*3/uL (ref 1.4–6.5)
NEUTROS PCT: 58 %
Platelets: 201 10*3/uL (ref 150–440)
RBC: 4.82 MIL/uL (ref 4.40–5.90)
RDW: 14.5 % (ref 11.5–14.5)
WBC: 7.5 10*3/uL (ref 3.8–10.6)

## 2017-02-24 LAB — PSA: PROSTATIC SPECIFIC ANTIGEN: 64.78 ng/mL — AB (ref 0.00–4.00)

## 2017-02-24 NOTE — Progress Notes (Addendum)
Hematology/Oncology Consult note [facility] [phone] [phone]  CONSULT NOTE Patient Care Team: Maryland Pink, MD as PCP - General (Family Medicine)  CHIEF COMPLAINTS/PURPOSE OF CONSULTATION:  Prostate Cancer  HISTORY OF PRESENTING ILLNESS:  Matthew Hunter 83 y.o.  male with PMH listed as below who was referred by [facility] Urology Provider [name] to me for evaluation of clinical prostate cancer and management.  He has a history of elevated PSA and BPH. PSA trends as follows: 11/14/2014 4.3, 11/07/2015 7.1, 05/11/2016 10.4, 06/11/2016 10.6, 07/07/2016 7.8, 01/06/2017 86.8.  He was seen by urology and was diagnosed with clinical prostate cancer given the extreme elevation of PSA and prostate nodule. Biopsy was not pursued. Bone scan showed multiple abnormal foci of increased osseous tracer accumulation compatible with widespread osseus metastatic disease. CT abdomen showed no evidence of metastatic disease, left adrenal nodule is indeterminate by relative washout characteristics but appears grossly stable in size from 2008, favoring adenoma.   Patient was accompanied by his wife today to clinic. He feels well and has not compliant. With further questioning, he mentioned he occationaly has "soreness" of his mid back, but no persistent pain. He also does not eat much, and is taking marijuana two time a day for appetite stimulant.  He has BPH symptoms. He remains active and able to do ADLs.  Quit smoking and ETOH use many years ago.    ROS:  Review of Systems  Constitutional: Positive for appetite change.  Eyes: Negative.   Respiratory: Negative.   Cardiovascular: Negative.   Gastrointestinal: Negative.   Endocrine: Negative.   Genitourinary: Positive for difficulty urinating, frequency and nocturia.   Musculoskeletal: Negative.   Skin: Negative.   Neurological: Negative.   Hematological: Negative.   Psychiatric/Behavioral: Negative.       MEDICAL HISTORY:  Past Medical History:  Diagnosis Date  . Arthritis   . Benign enlargement of prostate   . CAD (coronary artery disease)   . Cancer ([place])   . Constipation   . Elevated PSA   . GERD (gastroesophageal reflux disease)   . Hypertension   . Incomplete bladder emptying   . Nocturia   . Urgency of micturation   . Urinary frequency     SURGICAL HISTORY: Past Surgical History:  Procedure Laterality Date  . GALLBLADDER SURGERY    . PROSTATE SURGERY    . STOMACH SURGERY      SOCIAL HISTORY: Social History   Social History  . Marital status: Married    Spouse name: N/A  . Number of children: N/A  . Years of education: N/A   Occupational History  . Not on file.   Social History Main Topics  . Smoking status: Former Smoker    Quit date: 06/01/1998  . Smokeless tobacco: Never Used  . Alcohol use No  . Drug use: Yes    Frequency: 2.0 times per week     Comment: marijuana two time monthly gives him an appetite  . Sexual activity: Not on file   Other Topics Concern  . Not on file   Social History Narrative  . No narrative on file    FAMILY HISTORY: Family History  Problem Relation Age of Onset  . Hypertension Mother   . Stroke Father   . Kidney disease Neg Hx   . Prostate cancer Neg Hx   . Kidney cancer Neg Hx   . Bladder Cancer Neg Hx     ALLERGIES:  has No Known Allergies.  MEDICATIONS:  Current Outpatient Prescriptions  Medication Sig Dispense Refill  . amlodipine-atorvastatin (CADUET) 5-80 MG tablet Take 1 tablet by mouth daily.    Marland Kitchen aspirin EC 81 MG tablet Take by mouth.    Marland Kitchen atenolol (TENORMIN) 100 MG tablet Take 100 mg by mouth daily.     . cyclobenzaprine (FLEXERIL) 10 MG tablet Take 1 tablet (10 mg total) by mouth 3 (three) times daily as needed for muscle spasms. 21 tablet 0  . diazepam (VALIUM) 2 MG tablet Take 1/2 to 1 tablet as needed for dizziness, up to every 8 hours. Use with caution, as will make drowsy.    . dutasteride  (AVODART) 0.5 MG capsule Take 1 capsule (0.5 mg total) by mouth daily. 30 capsule 0  . finasteride (PROSCAR) 5 MG tablet Take 1 tablet (5 mg total) by mouth daily. 90 tablet 3  . ibuprofen (ADVIL,MOTRIN) 400 MG tablet Take 2 tablets (800 mg total) by mouth every 8 (eight) hours as needed. 15 tablet 0  . meclizine (ANTIVERT) 25 MG tablet Take 1 tablet (25 mg total) by mouth 3 (three) times daily as needed for dizziness or nausea. 20 tablet 0  . neomycin-polymyxin b-dexamethasone (MAXITROL) 3.5-10000-0.1 OINT apply A SMALL AMOUNT three times a day into left eye for 2 weeks    . nortriptyline (PAMELOR) 25 MG capsule Reported on 05/16/2015  1  . omeprazole (PRILOSEC) 20 MG capsule Take 20 mg by mouth daily.    . polyethylene glycol powder (GLYCOLAX/MIRALAX) powder Take by mouth.    . tamsulosin (FLOMAX) 0.4 MG CAPS capsule Take 1 capsule (0.4 mg total) by mouth daily. 90 capsule 3  . Telmisartan-Amlodipine 80-5 MG TABS Take by mouth daily.    . traMADol (ULTRAM) 50 MG tablet Take by mouth.     No current facility-administered medications for this visit.       Marland Kitchen  PHYSICAL EXAMINATION: ECOG PERFORMANCE STATUS: 0 - Asymptomatic Vitals:   02/24/17 0826  BP: (!) 148/71  Pulse: (!) 59  Resp: 12  Temp: 97.7 F (36.5 C)  SpO2: 99%   Filed Weights   02/24/17 0826  Weight: 185 lb 3.2 oz (84 kg)    GENERAL:Alert, no distress and comfortable.  EYES: no pallor or icterus OROPHARYNX: no thrush or ulceration; good dentition  NECK: supple, no masses felt LYMPH:  no palpable lymphadenopathy in the cervical, axillary or inguinal regions LUNGS: clear to auscultation and  No wheeze or crackles HEART/CVS: regular rate & rhythm and no murmurs; No lower extremity edema ABDOMEN: abdomen soft, non-tender and normal bowel sounds Musculoskeletal:no cyanosis of digits and no clubbing. No palpable spine tenderness.  PSYCH: alert & oriented x 3  NEURO: no focal motor/sensory deficits SKIN:  no rashes or  significant lesions  LABORATORY DATA:  I have reviewed the data as listed Lab Results  Component Value Date   WBC 14.1 (H) 07/10/2016   HGB 15.9 07/10/2016   HCT 47.4 07/10/2016   MCV 84.8 07/10/2016   PLT 266 07/10/2016    Recent Labs  07/10/16 1020 01/14/17 0837  NA 134*  --   K 5.5*  --   CL 101  --   CO2 27  --   GLUCOSE 141*  --   BUN 15 11  CREATININE 1.51* 1.05  CALCIUM 9.7  --   GFRNONAA 41* 65  GFRAA 47* 75    RADIOGRAPHIC STUDIES: I have personally reviewed the radiological images as listed and agreed with the findings in the report. 02/03/2017  Bone scan IMPRESSION: Multiple abnormal foci of with of increased osseous tracer accumulation compatible with widespread osseous metastatic disease as above. 02/03/2017 CT abdomen pelvis w contrast.  IMPRESSION: 1. No evidence of metastatic disease. 2. Left adrenal nodule is indeterminate by relative washout characteristics but appears grossly stable in size from 02/28/2007, favoring a benign adenoma. 3. Left renal stone. 4.  Aortic atherosclerosis (ICD10-170.0).  ASSESSMENT & PLAN:  1. Prostate cancer ([place])   2. Elevated PSA   3. Bone lesion    Clinically he has metastatic prostate cancer. He was started on Firmagon on 01/29/2017.  Ideally would like to have tissue diagnosis to confirm prostate cancer and also risk stratification. I ordered a tentative CT guided biopsy of his bone lesions Will put his case on tumor board for discussion and see if prostate biopsy is feasible.   Check CBC, CMP, PSA, testosterone levels.  If pathology confirmed diagnosis, upfront docetaxel or Abirateron+ low dose prednisone can be considered for high risk patients. For this patient, given his age, Abirateron/prednisone maybe a better option.   Assuming he is castration sensitive, bisphosphonate has not been shown to reduce SRE in castration sensitive patients.   All questions were answered. The patient knows to call the clinic  with any problems questions or concerns.  Return of visit: 1 week.  Total face to face encounter time for this patient visit was 60 min. >50% of the time was  spent in counseling and coordination of care.  Thank you for this kind referral and the opportunity to participate in the care of this patient. A copy of today's note is routed to referring provider [name].   Earlie Server, MD, PhD Hematology Oncology [facility] at [facility] Pager- [phone] 02/24/2017

## 2017-02-24 NOTE — Progress Notes (Signed)
Patient here for initial visit. No complaints of pain or discomfort. States he feels "normal".

## 2017-02-25 ENCOUNTER — Telehealth: Payer: Self-pay | Admitting: Urology

## 2017-02-25 LAB — TESTOSTERONE: Testosterone: 20 ng/dL — ABNORMAL LOW (ref 264–916)

## 2017-02-25 NOTE — Telephone Encounter (Signed)
Please schedule patient for a prostate biopsy.

## 2017-02-26 NOTE — Telephone Encounter (Signed)
He does not need to have clearance to stop ASA.

## 2017-02-26 NOTE — Telephone Encounter (Signed)
He has been scheduled for 03-10-17, does he need a clearance to stop his aspirin?  Thanks, Sharyn Lull

## 2017-02-26 NOTE — Telephone Encounter (Signed)
It does not matter.

## 2017-02-26 NOTE — Telephone Encounter (Signed)
Does he need it before or after his firmagon injection on 03-04-17 or does it matter?   Sharyn Lull

## 2017-03-02 NOTE — Progress Notes (Signed)
11:23 PM   Matthew Hunter 11/03/1929 [id number]  Referring provider: Maryland Pink, MD [address] [facility] [place], [place] [address]  No chief complaint on file.   HPI: Patient is an 83 year old African-American male with clinical prostate cancer who presents today for a Firmagon injection.    Clinical prostate cancer Patient's PSA is currently 86.8 ng/mL on 01/06/2017.    Contrast CT noted no evidence of metastatic disease.  Left adrenal nodule is indeterminate by relative washout characteristics but appears grossly stable in size from 02/28/2007, favoring a benign adenoma.  Left renal stone.  Aortic atherosclerosis.  Bone scan noted multiple abnormal foci of with of increased osseous tracer accumulation compatible with widespread osseous metastatic disease.  Patient has been seen and evaluated by the cancer center. He is to undergo a prostate biopsy for pathological confirmation for prostate cancer.  He is scheduled for the biopsy on 03/10/2017.    BPH WITH LUTS His IPSS score is 11, which is moderate lower urinary tract symptomatology. He is mostly satisfied with his quality life due to his urinary symptoms.  His previous IPSS score was 11/2.  His major complaints are frequency and intermittency.  He denies any dysuria, hematuria or suprapubic pain.  He currently taking tamsulosin and finasteride.  His has had TUMT about 7 years ago.  He also denies any recent fevers, chills, nausea or vomiting.  He does not have a family history of PCa.      IPSS    Row Name 01/14/17 0800         International Prostate Symptom Score   How often have you had the sensation of not emptying your bladder? Less than 1 in 5     How often have you had to urinate less than every two hours? Less than half the time     How often have you found you stopped and started again several times when you urinated? Less than half the time     How often have you found it difficult to postpone  urination? Less than 1 in 5 times     How often have you had a weak urinary stream? Less than half the time     How often have you had to strain to start urination? Less than 1 in 5 times     How many times did you typically get up at night to urinate? 2 Times     Total IPSS Score 11       Quality of Life due to urinary symptoms   If you were to spend the rest of your life with your urinary condition just the way it is now how would you feel about that? Mostly Satisfied        Score:  1-7 Mild 8-19 Moderate 20-35 Severe   PMH: Past Medical History:  Diagnosis Date  . Arthritis   . Benign enlargement of prostate   . CAD (coronary artery disease)   . Cancer ([place])   . Constipation   . Elevated PSA   . GERD (gastroesophageal reflux disease)   . Hypertension   . Incomplete bladder emptying   . Nocturia   . Urgency of micturation   . Urinary frequency     Surgical History: Past Surgical History:  Procedure Laterality Date  . GALLBLADDER SURGERY    . PROSTATE SURGERY    . STOMACH SURGERY      Home Medications:  Allergies as of 03/04/2017   No Known  Allergies     Medication List       Accurate as of 03/02/17 11:23 PM. Always use your most recent med list.          amlodipine-atorvastatin 5-80 MG tablet Commonly known as:  CADUET Take 1 tablet by mouth daily.   aspirin EC 81 MG tablet Take by mouth.   atenolol 100 MG tablet Commonly known as:  TENORMIN Take 100 mg by mouth daily.   cyclobenzaprine 10 MG tablet Commonly known as:  FLEXERIL Take 1 tablet (10 mg total) by mouth 3 (three) times daily as needed for muscle spasms.   diazepam 2 MG tablet Commonly known as:  VALIUM Take 1/2 to 1 tablet as needed for dizziness, up to every 8 hours. Use with caution, as will make drowsy.   dutasteride 0.5 MG capsule Commonly known as:  AVODART Take 1 capsule (0.5 mg total) by mouth daily.   finasteride 5 MG tablet Commonly known as:  PROSCAR Take 1 tablet (5  mg total) by mouth daily.   ibuprofen 400 MG tablet Commonly known as:  ADVIL,MOTRIN Take 2 tablets (800 mg total) by mouth every 8 (eight) hours as needed.   meclizine 25 MG tablet Commonly known as:  ANTIVERT Take 1 tablet (25 mg total) by mouth 3 (three) times daily as needed for dizziness or nausea.   neomycin-polymyxin b-dexamethasone 3.5-10000-0.1 Oint Commonly known as:  MAXITROL apply A SMALL AMOUNT three times a day into left eye for 2 weeks   nortriptyline 25 MG capsule Commonly known as:  PAMELOR Reported on 05/16/2015   omeprazole 20 MG capsule Commonly known as:  PRILOSEC Take 20 mg by mouth daily.   polyethylene glycol powder powder Commonly known as:  GLYCOLAX/MIRALAX Take by mouth.   tamsulosin 0.4 MG Caps capsule Commonly known as:  FLOMAX Take 1 capsule (0.4 mg total) by mouth daily.   Telmisartan-Amlodipine 80-5 MG Tabs Take by mouth daily.   traMADol 50 MG tablet Commonly known as:  ULTRAM Take by mouth.       Allergies: No Known Allergies  Family History: Family History  Problem Relation Age of Onset  . Hypertension Mother   . Stroke Father   . Kidney disease Neg Hx   . Prostate cancer Neg Hx   . Kidney cancer Neg Hx   . Bladder Cancer Neg Hx     Social History:  reports that he quit smoking about 18 years ago. He has never used smokeless tobacco. He reports that he uses drugs about 2 times per week. He reports that he does not drink alcohol.  ROS:     Physical Exam: There were no vitals taken for this visit.  Constitutional: Well nourished. Alert and oriented, No acute distress. HEENT: [place] AT, moist mucus membranes. Trachea midline, no masses. Cardiovascular: No clubbing, cyanosis, or edema. Respiratory: Normal respiratory effort, no increased work of breathing. Skin: No rashes, bruises or suspicious lesions. Lymph: No cervical or inguinal adenopathy. Neurologic: Grossly intact, no focal deficits, moving all 4  extremities. Psychiatric: Normal mood and affect.  Laboratory Data: PSA History:     2.66 ng/mL on 11/17/2011     4.54 ng/mL on 12/07/2013     3.3 ng/mL on 04/09/2014     4.3 ng/mL on 10/2014     6.3 ng/mL on 04/2015    7.1 ng/mL on 11/07/2015 Results for orders placed or performed in visit on 02/24/17  CBC with Differential/Platelet  Result Value Ref Range   WBC  7.5 3.8 - 10.6 K/uL   RBC 4.82 4.40 - 5.90 MIL/uL   Hemoglobin 13.6 13.0 - 18.0 g/dL   HCT 39.6 (L) 40.0 - 52.0 %   MCV 82.1 80.0 - 100.0 fL   MCH 28.3 26.0 - 34.0 pg   MCHC 34.5 32.0 - 36.0 g/dL   RDW 14.5 11.5 - 14.5 %   Platelets 201 150 - 440 K/uL   Neutrophils Relative % 58 %   Neutro Abs 4.3 1.4 - 6.5 K/uL   Lymphocytes Relative 31 %   Lymphs Abs 2.3 1.0 - 3.6 K/uL   Monocytes Relative 9 %   Monocytes Absolute 0.7 0.2 - 1.0 K/uL   Eosinophils Relative 2 %   Eosinophils Absolute 0.1 0 - 0.7 K/uL   Basophils Relative 0 %   Basophils Absolute 0.0 0 - 0.1 K/uL  Comprehensive metabolic panel  Result Value Ref Range   Sodium 137 135 - 145 mmol/L   Potassium 4.2 3.5 - 5.1 mmol/L   Chloride 105 101 - 111 mmol/L   CO2 26 22 - 32 mmol/L   Glucose, Bld 111 (H) 65 - 99 mg/dL   BUN 17 6 - 20 mg/dL   Creatinine, Ser 1.19 0.61 - 1.24 mg/dL   Calcium 10.0 8.9 - 10.3 mg/dL   Total Protein 8.4 (H) 6.5 - 8.1 g/dL   Albumin 4.1 3.5 - 5.0 g/dL   AST 26 15 - 41 U/L   ALT 17 17 - 63 U/L   Alkaline Phosphatase 101 38 - 126 U/L   Total Bilirubin 1.4 (H) 0.3 - 1.2 mg/dL   GFR calc non Af Amer 54 (L) >60 mL/min   GFR calc Af Amer >60 >60 mL/min   Anion gap 6 5 - 15  PSA  Result Value Ref Range   Prostatic Specific Antigen 64.78 (H) 0.00 - 4.00 ng/mL  Testosterone  Result Value Ref Range   Testosterone 20 (L) 264 - 916 ng/dL   Lab Results  Component Value Date   WBC 7.5 02/24/2017   HGB 13.6 02/24/2017   HCT 39.6 (L) 02/24/2017   MCV 82.1 02/24/2017   PLT 201 02/24/2017    Lab Results  Component Value Date    CREATININE 1.19 02/24/2017   I have reviewed the labs  Pertinent imaging CLINICAL DATA:  Prostate cancer, staging.  EXAM: CT ABDOMEN AND PELVIS WITH CONTRAST  TECHNIQUE: Multidetector CT imaging of the abdomen and pelvis was performed using the standard protocol following bolus administration of intravenous contrast.  CONTRAST:  144mL ISOVUE-300 IOPAMIDOL (ISOVUE-300) INJECTION 61%  COMPARISON:  02/28/2007.  FINDINGS: Lower chest: Lung bases show no acute findings. Heart is mildly enlarged. No pericardial or pleural effusion distal esophagus is air-filled.  Hepatobiliary: Subcentimeter low-attenuation lesions in the liver are too small to characterize. Cholecystectomy. No biliary ductal dilatation.  Pancreas: Negative.  Spleen: Negative.  Adrenals/Urinary Tract: Right adrenal gland is unremarkable. Left adrenal gland nodule measures 1.5 cm with 30% relative washout. Size is grossly stable from 02/28/2007. Right kidney is unremarkable. Low-attenuation lesions in the left kidney measure up to 2.0 cm and are likely cysts. A stone is seen in the lower pole left kidney. Ureters are decompressed. Bladder is grossly unremarkable.  Stomach/Bowel: Stomach, small bowel, appendix and colon are unremarkable. Likely fecal material in the rectum, rather than a true polyp.  Vascular/Lymphatic: Atherosclerotic calcification of the arterial vasculature without abdominal aortic aneurysm. No pathologically enlarged lymph nodes.  Reproductive: Prostate is visualized and appears mildly heterogeneous.  Other: No free fluid.  Mesenteries and peritoneum are unremarkable.  Musculoskeletal: No worrisome lytic or sclerotic lesions. Degenerative changes in the spine. Old left rib fracture.  IMPRESSION: 1. No evidence of metastatic disease. 2. Left adrenal nodule is indeterminate by relative washout characteristics but appears grossly stable in size from  02/28/2007, favoring a benign adenoma. 3. Left renal stone. 4.  Aortic atherosclerosis (ICD10-170.0).   Electronically Signed   By: Lorin Picket M.D.   On: 02/03/2017 13:12  CLINICAL DATA:  Prostate cancer, high risk treatment, low back pain  EXAM: NUCLEAR MEDICINE WHOLE BODY BONE SCAN  TECHNIQUE: Whole body anterior and posterior images were obtained approximately 3 hours after intravenous injection of radiopharmaceutical.  RADIOPHARMACEUTICALS:  22.96 mCi Technetium-104m MDP IV  COMPARISON:  None  Radiographic correlation:  CT abdomen and pelvis 02/03/2017  FINDINGS: Numerous foci of abnormal osseous tracer accumulation are identified in a pattern most consistent with osseous metastatic disease.  These include anterior and posterior ribs bilaterally, proximal LEFT humeral metaphysis, RIGHT scapula, question LEFT scapula, RIGHT temporal bone, thoracic spine, proximal LEFT femur at the lesser trochanter and intertrochanteric region, in the pelvis, and questionably at the mid to distal LEFT femoral diaphysis.  Focus of abnormal tracer localization LEFT supraclavicular, probably soft tissue, nonspecific; this is opposite these site of probable injection, could represent abnormal lymph node, soft tissue calcification, less likely arising from the anterior aspect of the medial border of the LEFT scapula.  Otherwise expected urinary tract and soft tissue distribution of tracer.  IMPRESSION: Multiple abnormal foci of with of increased osseous tracer accumulation compatible with widespread osseous metastatic disease as above.   Electronically Signed   By: Lavonia Dana M.D.   On: 02/03/2017 14:55  Assessment & Plan:    1. Clinical prostate cancer   - Patient's most recent PSA of 64.78 on 02/24/2017  - will be undergoing a biopsy for pathological confirmation of prostate cancer  - 1st Firmagon received on 01/29/2017 - no side effects experienced   -  Firmagon received on 03/04/2017   2. Metastasis to the bone  - Dr.Yu - if pathology confirmed diagnosis, upfront docetaxel or Abirateron+ low dose prednisone can be considered for high risk patients. For this patient, given his age, Abirateron/prednisone maybe a better option  - his prostate biopsy is scheduled for 03/10/2017  3. LUTS  - IPSS score is 11/2, it is stable  - Continue conservative management, avoiding bladder irritants and timed voiding's  - Continue tamsulosin 0.4 mg daily and finasteride 5 mg daily for now - may discontinue in the future   [name], [place] [address] [place], [place] [address] [phone]

## 2017-03-04 ENCOUNTER — Encounter: Payer: Self-pay | Admitting: Urology

## 2017-03-04 ENCOUNTER — Ambulatory Visit (INDEPENDENT_AMBULATORY_CARE_PROVIDER_SITE_OTHER): Payer: Medicare Other | Admitting: Urology

## 2017-03-04 VITALS — BP 109/64 | HR 61 | Ht 69.0 in | Wt 187.4 lb

## 2017-03-04 DIAGNOSIS — C7951 Secondary malignant neoplasm of bone: Secondary | ICD-10-CM

## 2017-03-04 DIAGNOSIS — R399 Unspecified symptoms and signs involving the genitourinary system: Secondary | ICD-10-CM

## 2017-03-04 DIAGNOSIS — C61 Malignant neoplasm of prostate: Secondary | ICD-10-CM | POA: Diagnosis not present

## 2017-03-04 MED ORDER — DEGARELIX ACETATE 80 MG ~~LOC~~ SOLR
80.0000 mg | Freq: Once | SUBCUTANEOUS | Status: AC
  Administered 2017-03-04: 80 mg via SUBCUTANEOUS

## 2017-03-04 NOTE — Progress Notes (Signed)
Firmagon Sub Q Injection  Due to Prostate Cancer patient is present today for a Firmagon Injection.   Medication: [name] (Degarelix)  Dose: 80mg  Location: left lower abdomen Patient had a lot of bruising on upper Lot: [id number] Exp: 11/2018  Patient tolerated well, no complications were noted.  Performed by: Lyndee Hensen CMA  Follow up: Return for Biopsy on the 10th

## 2017-03-10 ENCOUNTER — Ambulatory Visit (INDEPENDENT_AMBULATORY_CARE_PROVIDER_SITE_OTHER): Payer: Medicare Other | Admitting: Urology

## 2017-03-10 ENCOUNTER — Other Ambulatory Visit: Payer: Self-pay | Admitting: Urology

## 2017-03-10 VITALS — BP 138/80 | HR 60 | Ht 69.0 in | Wt 186.9 lb

## 2017-03-10 DIAGNOSIS — R972 Elevated prostate specific antigen [PSA]: Secondary | ICD-10-CM

## 2017-03-10 MED ORDER — LEVOFLOXACIN 500 MG PO TABS
500.0000 mg | ORAL_TABLET | Freq: Once | ORAL | Status: AC
  Administered 2017-03-10: 500 mg via ORAL

## 2017-03-10 MED ORDER — LIDOCAINE HCL 2 % EX GEL
1.0000 "application " | Freq: Once | CUTANEOUS | Status: AC
  Administered 2017-03-10: 1 via URETHRAL

## 2017-03-10 MED ORDER — GENTAMICIN SULFATE 40 MG/ML IJ SOLN
80.0000 mg | Freq: Once | INTRAMUSCULAR | Status: AC
  Administered 2017-03-10: 80 mg via INTRAMUSCULAR

## 2017-03-10 NOTE — Progress Notes (Addendum)
83 yo here for prostate biopsy. He has a very high PSA and bone metastases. Presumed prostate cancer. Oncology requests tissue confirmation.   Prostate Biopsy Procedure   Informed consent was obtained after discussing risks/benefits of the procedure.  A time out was performed to ensure correct patient identity.  Pre-Procedure: - Last PSA Level: No results found for: PSA - Gentamicin given prophylactically - Levaquin 500 mg administered PO -DRE: prostate ~30 g, subtle nodule right and left prostate, landmarks preserved.  -Transrectal Ultrasound performed revealing a 35 gm prostate -No significant hypoechoic or median lobe noted  Procedure: - Prostate block performed using 10 cc 1% lidocaine and biopsies taken from sextant areas, a total of 6 under ultrasound guidance.  Post-Procedure: - Patient tolerated the procedure well - He was counseled to seek immediate medical attention if experiences any severe pain, significant bleeding, or fevers - F/u 04/04/2017 for further ADT.

## 2017-03-16 ENCOUNTER — Other Ambulatory Visit: Payer: Self-pay | Admitting: Urology

## 2017-03-16 LAB — PATHOLOGY REPORT

## 2017-03-22 ENCOUNTER — Ambulatory Visit (INDEPENDENT_AMBULATORY_CARE_PROVIDER_SITE_OTHER): Payer: Medicare Other | Admitting: Urology

## 2017-03-22 VITALS — BP 147/80 | HR 68 | Ht 69.0 in | Wt 187.4 lb

## 2017-03-22 DIAGNOSIS — C61 Malignant neoplasm of prostate: Secondary | ICD-10-CM | POA: Diagnosis not present

## 2017-03-22 DIAGNOSIS — C7951 Secondary malignant neoplasm of bone: Secondary | ICD-10-CM | POA: Diagnosis not present

## 2017-03-22 NOTE — Progress Notes (Signed)
03/22/2017 11:41 AM   Lucia Estelle Aug 08, 1929 [id number]  Referring provider: Maryland Pink, MD [address] [facility] [place], [place] [address]  Chief Complaint  Patient presents with  . Prostate Cancer    HPI:   F/u review biopsy results. He has a very high PSA (65) and bone metastases (Sep 2018 bone scan, CT negative for LAD) and needed path confirmation. Six-core Biopsy 03/10/2017 revealed a 35 gram prostate with Gleason 4+4=8, 2 cores, left base, left apex and Gleason 4+5=9, two cores, left mid and right mid. 4/6 cores positive.   He's doing well after the biopsy.    PMH: Past Medical History:  Diagnosis Date  . Arthritis   . Benign enlargement of prostate   . CAD (coronary artery disease)   . Cancer ([place])   . Constipation   . Elevated PSA   . GERD (gastroesophageal reflux disease)   . Hypertension   . Incomplete bladder emptying   . Nocturia   . Urgency of micturation   . Urinary frequency     Surgical History: Past Surgical History:  Procedure Laterality Date  . GALLBLADDER SURGERY    . PROSTATE SURGERY    . STOMACH SURGERY      Home Medications:  Allergies as of 03/22/2017   No Known Allergies     Medication List       Accurate as of 03/22/17 11:41 AM. Always use your most recent med list.          amlodipine-atorvastatin 5-80 MG tablet Commonly known as:  CADUET Take 1 tablet by mouth daily.   aspirin EC 81 MG tablet Take by mouth.   atenolol 100 MG tablet Commonly known as:  TENORMIN Take 100 mg by mouth daily.   CALCIUM-VITAMIN D PO Take by mouth.   cyclobenzaprine 10 MG tablet Commonly known as:  FLEXERIL Take 1 tablet (10 mg total) by mouth 3 (three) times daily as needed for muscle spasms.   diazepam 2 MG tablet Commonly known as:  VALIUM Take 1/2 to 1 tablet as needed for dizziness, up to every 8 hours. Use with caution, as will make drowsy.   dutasteride 0.5 MG capsule Commonly known as:  AVODART Take 1  capsule (0.5 mg total) by mouth daily.   finasteride 5 MG tablet Commonly known as:  PROSCAR Take 1 tablet (5 mg total) by mouth daily.   ibuprofen 400 MG tablet Commonly known as:  ADVIL,MOTRIN Take 2 tablets (800 mg total) by mouth every 8 (eight) hours as needed.   meclizine 25 MG tablet Commonly known as:  ANTIVERT Take 1 tablet (25 mg total) by mouth 3 (three) times daily as needed for dizziness or nausea.   neomycin-polymyxin b-dexamethasone 3.5-10000-0.1 Oint Commonly known as:  MAXITROL apply A SMALL AMOUNT three times a day into left eye for 2 weeks   nortriptyline 25 MG capsule Commonly known as:  PAMELOR Reported on 05/16/2015   omeprazole 20 MG capsule Commonly known as:  PRILOSEC Take 20 mg by mouth daily.   polyethylene glycol powder powder Commonly known as:  GLYCOLAX/MIRALAX Take by mouth.   pyridOXINE 100 MG tablet Commonly known as:  VITAMIN B-6 Take 100 mg by mouth daily.   tamsulosin 0.4 MG Caps capsule Commonly known as:  FLOMAX Take 1 capsule (0.4 mg total) by mouth daily.   Telmisartan-Amlodipine 80-5 MG Tabs Take by mouth daily.   traMADol 50 MG tablet Commonly known as:  ULTRAM Take by mouth.  Allergies: No Known Allergies  Family History: Family History  Problem Relation Age of Onset  . Hypertension Mother   . Stroke Father   . Kidney disease Neg Hx   . Prostate cancer Neg Hx   . Kidney cancer Neg Hx   . Bladder Cancer Neg Hx     Social History:  reports that he quit smoking about 18 years ago. He has never used smokeless tobacco. He reports that he uses drugs about 2 times per week. He reports that he does not drink alcohol.  ROS:                                        Physical Exam: There were no vitals taken for this visit.  Constitutional:  Alert and oriented, No acute distress. HEENT: [place] AT, moist mucus membranes.  Trachea midline, no masses. Cardiovascular: No clubbing, cyanosis, or  edema. Respiratory: Normal respiratory effort, no increased work of breathing. GI: Abdomen is soft, nontender, nondistended, no abdominal masses GU: No CVA tenderness. Skin: No rashes, bruises or suspicious lesions. Neurologic: Grossly intact, no focal deficits, moving all 4 extremities. Psychiatric: Normal mood and affect.  Laboratory Data: Lab Results  Component Value Date   WBC 7.5 02/24/2017   HGB 13.6 02/24/2017   HCT 39.6 (L) 02/24/2017   MCV 82.1 02/24/2017   PLT 201 02/24/2017    Lab Results  Component Value Date   CREATININE 1.19 02/24/2017    Lab Results  Component Value Date   PSA1 86.8 (H) 01/06/2017   PSA1 7.8 (H) 07/07/2016   PSA1 10.6 (H) 06/11/2016    Lab Results  Component Value Date   TESTOSTERONE 20 (L) 02/24/2017    No results found for: HGBA1C  Urinalysis Lab Results  Component Value Date   APPEARANCEUR CLEAR (A) 07/10/2016   LEUKOCYTESUR TRACE (A) 07/10/2016   PROTEINUR NEGATIVE 07/10/2016   GLUCOSEU NEGATIVE 07/10/2016   RBCU 0-5 07/10/2016   BILIRUBINUR NEGATIVE 07/10/2016   NITRITE NEGATIVE 07/10/2016    Lab Results  Component Value Date   BACTERIA NONE SEEN 07/10/2016     No results found for this or any previous visit. No results found for this or any previous visit. No results found for this or any previous visit. No results found for this or any previous visit. No results found for this or any previous visit. No results found for this or any previous visit. No results found for this or any previous visit. No results found for this or any previous visit.  Assessment & Plan:   High grade metastatic PCa - discussed path with patient. F/u with oncology to review path and other treatments options for metastatic disease. He'll return here 04/04/2017 or after for ADT - likely something more long term like Lupron.   There are no diagnoses linked to this encounter.  No Follow-up on file.  Festus Aloe, [facility] [address] [place], [place] [address] [phone]

## 2017-04-09 NOTE — Progress Notes (Signed)
11:02 AM   Matthew Hunter 10/25/1929 [id number]  Referring provider: Maryland Pink, MD [address] [facility] [place], [place] [address]  Chief Complaint  Patient presents with  . Prostate Cancer    HPI: Patient is an 83 year old African-American male with metastatic prostate cancer who presents today for a Lupron injection.    Clinical prostate cancer Patient's PSA is currently 86.8 ng/mL on 01/06/2017.    Contrast CT noted no evidence of metastatic disease.  Left adrenal nodule is indeterminate by relative washout characteristics but appears grossly stable in size from 02/28/2007, favoring a benign adenoma.  Left renal stone.  Aortic atherosclerosis.  Bone scan noted multiple abnormal foci of with of increased osseous tracer accumulation compatible with widespread osseous metastatic disease.  Patient has been seen and evaluated by the cancer center. He is to undergo a prostate biopsy for pathological confirmation for prostate cancer.  He underwent a biopsy on 03/10/2017 with Dr. Junious Silk. Pathology was positive for six-core Biopsy 03/10/2017 revealed a 35 gram prostate with Gleason 4+4=8, 2 cores, left base, left apex and Gleason 4+5=9, two cores, left mid and right mid. 4/6 cores positive.   His most recent PSA was 64.78 ng/mL on 02/24/2017.    BPH WITH LUTS His IPSS score is 14, which is moderate lower urinary tract symptomatology. He is mixed with his quality life due to his urinary symptoms.  His PVR is 73 mL.  His previous IPSS score was 11/2.  His major complaints are frequency, nocturia and intermittency.  He denies any dysuria, hematuria or suprapubic pain.  He currently taking tamsulosin and finasteride.  His has had TUMT about 7 years ago.  He also denies any recent fevers, chills, nausea or vomiting.  He does not have a family history of PCa.  IPSS    Row Name 04/12/17 1000         International Prostate Symptom Score   How often have you had the sensation of  not emptying your bladder?  About half the time     How often have you had to urinate less than every two hours?  About half the time     How often have you found you stopped and started again several times when you urinated?  Less than 1 in 5 times     How often have you found it difficult to postpone urination?  Less than 1 in 5 times     How often have you had a weak urinary stream?  About half the time     How often have you had to strain to start urination?  Less than 1 in 5 times     How many times did you typically get up at night to urinate?  2 Times     Total IPSS Score  14       Quality of Life due to urinary symptoms   If you were to spend the rest of your life with your urinary condition just the way it is now how would you feel about that?  Mixed        Score:  1-7 Mild 8-19 Moderate 20-35 Severe   PMH: Past Medical History:  Diagnosis Date  . Arthritis   . Benign enlargement of prostate   . CAD (coronary artery disease)   . Cancer ([place])   . Constipation   . Elevated PSA   . GERD (gastroesophageal reflux disease)   . Hypertension   . Incomplete bladder emptying   .  Nocturia   . Urgency of micturation   . Urinary frequency     Surgical History: Past Surgical History:  Procedure Laterality Date  . GALLBLADDER SURGERY    . PROSTATE SURGERY    . STOMACH SURGERY      Home Medications:  Allergies as of 04/12/2017   No Known Allergies     Medication List        Accurate as of 04/12/17 11:02 AM. Always use your most recent med list.          aspirin EC 81 MG tablet Take by mouth.   atenolol 100 MG tablet Commonly known as:  TENORMIN Take 100 mg by mouth daily.   CALCIUM-VITAMIN D PO Take by mouth.   dutasteride 0.5 MG capsule Commonly known as:  AVODART Take 1 capsule (0.5 mg total) by mouth daily.   finasteride 5 MG tablet Commonly known as:  PROSCAR Take 1 tablet (5 mg total) by mouth daily.   nortriptyline 25 MG capsule Commonly  known as:  PAMELOR Reported on 05/16/2015   omeprazole 20 MG capsule Commonly known as:  PRILOSEC Take 20 mg by mouth daily.   pyridOXINE 100 MG tablet Commonly known as:  VITAMIN B-6 Take 100 mg by mouth daily.   tamsulosin 0.4 MG Caps capsule Commonly known as:  FLOMAX Take 1 capsule (0.4 mg total) by mouth daily.   Telmisartan-Amlodipine 80-5 MG Tabs Take by mouth daily.   traMADol 50 MG tablet Commonly known as:  ULTRAM Take 50 mg every 6 (six) hours as needed by mouth.       Allergies: No Known Allergies  Family History: Family History  Problem Relation Age of Onset  . Hypertension Mother   . Stroke Father   . Kidney disease Neg Hx   . Prostate cancer Neg Hx   . Kidney cancer Neg Hx   . Bladder Cancer Neg Hx     Social History:  reports that he quit smoking about 18 years ago. he has never used smokeless tobacco. He reports that he uses drugs. Frequency: 2.00 times per week. He reports that he does not drink alcohol.  ROS: UROLOGY Frequent Urination?: Yes Hard to postpone urination?: No Burning/pain with urination?: No Get up at night to urinate?: Yes Leakage of urine?: No Urine stream starts and stops?: Yes Trouble starting stream?: No Do you have to strain to urinate?: No Blood in urine?: No Urinary tract infection?: No Sexually transmitted disease?: No Injury to kidneys or bladder?: No Painful intercourse?: No Weak stream?: No Erection problems?: No Penile pain?: No Gastrointestinal Nausea?: No Vomiting?: No Indigestion/heartburn?: No Diarrhea?: No Constipation?: Yes Constitutional Fever: No Night sweats?: Yes Weight loss?: No Fatigue?: No Skin Skin rash/lesions?: No Itching?: No Eyes Blurred vision?: Yes Double vision?: No Ears/Nose/Throat Sore throat?: No Sinus problems?: No Hematologic/Lymphatic Swollen glands?: No Easy bruising?: No Cardiovascular Leg swelling?: No Chest pain?: No Respiratory Cough?: No Shortness of  breath?: No Endocrine Excessive thirst?: No Musculoskeletal Back pain?: Yes Joint pain?: No Neurological Headaches?: No Dizziness?: No Psychologic Depression?: No Anxiety?: No   Physical Exam: BP 112/64   Pulse 62   Ht 5\' 9"  (1.753 m)   Wt 189 lb 9.6 oz (86 kg)   BMI 28.00 kg/m   Constitutional: Well nourished. Alert and oriented, No acute distress. HEENT: [place] AT, moist mucus membranes. Trachea midline, no masses. Cardiovascular: No clubbing, cyanosis, or edema. Respiratory: Normal respiratory effort, no increased work of breathing. Skin: No rashes, bruises or suspicious  lesions. Lymph: No cervical or inguinal adenopathy. Neurologic: Grossly intact, no focal deficits, moving all 4 extremities. Psychiatric: Normal mood and affect.  Laboratory Data: PSA History:     2.66 ng/mL on 11/17/2011     4.54 ng/mL on 12/07/2013     3.3 ng/mL on 04/09/2014     4.3 ng/mL on 10/2014     6.3 ng/mL on 04/2015    7.1 ng/mL on 11/07/2015  10.4 ng/mL on 05/11/2016  7.8 ng/mL on 07/07/2016  86.8 ng/mL on 01/06/2017  64.78 ng/mL on 02/24/2017  Results for orders placed or performed in visit on 04/12/17  Bladder Scan (Post Void Residual) in office  Result Value Ref Range   Scan Result 73    Lab Results  Component Value Date   WBC 7.5 02/24/2017   HGB 13.6 02/24/2017   HCT 39.6 (L) 02/24/2017   MCV 82.1 02/24/2017   PLT 201 02/24/2017    Lab Results  Component Value Date   CREATININE 1.19 02/24/2017   I have reviewed the labs  Pertinent imaging CLINICAL DATA:  Prostate cancer, staging.  EXAM: CT ABDOMEN AND PELVIS WITH CONTRAST  TECHNIQUE: Multidetector CT imaging of the abdomen and pelvis was performed using the standard protocol following bolus administration of intravenous contrast.  CONTRAST:  13mL ISOVUE-300 IOPAMIDOL (ISOVUE-300) INJECTION 61%  COMPARISON:  02/28/2007.  FINDINGS: Lower chest: Lung bases show no acute findings. Heart is  mildly enlarged. No pericardial or pleural effusion distal esophagus is air-filled.  Hepatobiliary: Subcentimeter low-attenuation lesions in the liver are too small to characterize. Cholecystectomy. No biliary ductal dilatation.  Pancreas: Negative.  Spleen: Negative.  Adrenals/Urinary Tract: Right adrenal gland is unremarkable. Left adrenal gland nodule measures 1.5 cm with 30% relative washout. Size is grossly stable from 02/28/2007. Right kidney is unremarkable. Low-attenuation lesions in the left kidney measure up to 2.0 cm and are likely cysts. A stone is seen in the lower pole left kidney. Ureters are decompressed. Bladder is grossly unremarkable.  Stomach/Bowel: Stomach, small bowel, appendix and colon are unremarkable. Likely fecal material in the rectum, rather than a true polyp.  Vascular/Lymphatic: Atherosclerotic calcification of the arterial vasculature without abdominal aortic aneurysm. No pathologically enlarged lymph nodes.  Reproductive: Prostate is visualized and appears mildly heterogeneous.  Other: No free fluid.  Mesenteries and peritoneum are unremarkable.  Musculoskeletal: No worrisome lytic or sclerotic lesions. Degenerative changes in the spine. Old left rib fracture.  IMPRESSION: 1. No evidence of metastatic disease. 2. Left adrenal nodule is indeterminate by relative washout characteristics but appears grossly stable in size from 02/28/2007, favoring a benign adenoma. 3. Left renal stone. 4.  Aortic atherosclerosis (ICD10-170.0).   Electronically Signed   By: Lorin Picket M.D.   On: 02/03/2017 13:12  CLINICAL DATA:  Prostate cancer, high risk treatment, low back pain  EXAM: NUCLEAR MEDICINE WHOLE BODY BONE SCAN  TECHNIQUE: Whole body anterior and posterior images were obtained approximately 3 hours after intravenous injection of radiopharmaceutical.  RADIOPHARMACEUTICALS:  22.96 mCi Technetium-13m MDP  IV  COMPARISON:  None  Radiographic correlation:  CT abdomen and pelvis 02/03/2017  FINDINGS: Numerous foci of abnormal osseous tracer accumulation are identified in a pattern most consistent with osseous metastatic disease.  These include anterior and posterior ribs bilaterally, proximal LEFT humeral metaphysis, RIGHT scapula, question LEFT scapula, RIGHT temporal bone, thoracic spine, proximal LEFT femur at the lesser trochanter and intertrochanteric region, in the pelvis, and questionably at the mid to distal LEFT femoral diaphysis.  Focus of abnormal tracer  localization LEFT supraclavicular, probably soft tissue, nonspecific; this is opposite these site of probable injection, could represent abnormal lymph node, soft tissue calcification, less likely arising from the anterior aspect of the medial border of the LEFT scapula.  Otherwise expected urinary tract and soft tissue distribution of tracer.  IMPRESSION: Multiple abnormal foci of with of increased osseous tracer accumulation compatible with widespread osseous metastatic disease as above.   Electronically Signed   By: Lavonia Dana M.D.   On: 02/03/2017 14:55  Results for Matthew Hunter, Matthew Hunter (MRN [id number]) as of 04/12/2017 11:04  Ref. Range 04/12/2017 11:00  Scan Result Unknown 73    Assessment & Plan:    1. Metastatic prostate cancer   - Patient's most recent PSA of 64.78 on 02/24/2017  - biopsy positive for Gleason's 9   - continue ADT therapy - received 6 month Lupron injection today  - PSA and testosterone drawn today  - RTC in 6 months for next Lupron  2. Metastasis to the bone  - Dr.Yu - if pathology confirmed diagnosis, upfront docetaxel or Abirateron+ low dose prednisone can be considered for high risk patients. For this patient, given his age, Abirateron/prednisone maybe a better option  - biopsy completed - refer back to the Cancer center for abirateron/prednisone if appropriate   3.  LUTS  - IPSS score is 14/3, it is worsening  - Continue conservative management, avoiding bladder irritants and timed voiding's - drinking a lot of soda  - Continue tamsulosin 0.4 mg daily  - discontinue the finasteride  - add myrbetriq 25 mg daily - RTC in 3 weeks for I PSS and PVR   Royden Purl  [facility] [address], [place], [place] [address] [phone]

## 2017-04-12 ENCOUNTER — Ambulatory Visit: Payer: Medicare Other | Admitting: Urology

## 2017-04-12 ENCOUNTER — Encounter: Payer: Self-pay | Admitting: Urology

## 2017-04-12 VITALS — BP 112/64 | HR 62 | Ht 69.0 in | Wt 189.6 lb

## 2017-04-12 DIAGNOSIS — C7951 Secondary malignant neoplasm of bone: Secondary | ICD-10-CM | POA: Diagnosis not present

## 2017-04-12 DIAGNOSIS — R399 Unspecified symptoms and signs involving the genitourinary system: Secondary | ICD-10-CM | POA: Diagnosis not present

## 2017-04-12 DIAGNOSIS — C61 Malignant neoplasm of prostate: Secondary | ICD-10-CM

## 2017-04-12 LAB — BLADDER SCAN AMB NON-IMAGING: SCAN RESULT: 73

## 2017-04-12 MED ORDER — LEUPROLIDE ACETATE (6 MONTH) 45 MG IM KIT
45.0000 mg | PACK | Freq: Once | INTRAMUSCULAR | Status: AC
  Administered 2017-04-12: 45 mg via INTRAMUSCULAR

## 2017-04-13 LAB — TESTOSTERONE: Testosterone: 14 ng/dL — ABNORMAL LOW (ref 264–916)

## 2017-04-13 LAB — PSA: Prostate Specific Ag, Serum: 131 ng/mL — ABNORMAL HIGH (ref 0.0–4.0)

## 2017-04-29 NOTE — Progress Notes (Signed)
Lupron IM Injection   Due to Prostate Cancer patient is present today for a Lupron Injection.  Medication: Lupron 6 month Dose: 45 mg  Location: right upper outer buttocks Lot: See MAR Exp: See MAR  Patient tolerated well, no complications were noted  Performed by: Reece Packer, RN

## 2017-04-30 ENCOUNTER — Telehealth: Payer: Self-pay | Admitting: Urology

## 2017-04-30 NOTE — Telephone Encounter (Signed)
Called and scheduled app with Diane at the [facility] center for 05-03-17 @ 10:30. I have called the patient and had to leave a message for him to return my call.  Sharyn Lull

## 2017-04-30 NOTE — Telephone Encounter (Signed)
-----   Message from Nori Riis, PA-C sent at 04/13/2017  7:32 AM EST ----- Please make sure Mr. Sanna has been referred back to the Cancer center.  His PSA has increased since starting ADT therapy.

## 2017-05-02 NOTE — Progress Notes (Addendum)
Hematology/Oncology Consult note [facility] [phone] [phone]  CONSULT NOTE Patient Care Team: Maryland Pink, MD as PCP - General (Family Medicine)  CHIEF COMPLAINTS/PURPOSE OF CONSULTATION:  Prostate Cancer  HISTORY OF PRESENTING ILLNESS:  Matthew Hunter 83 y.o.  male with PMH listed as below who was referred by [facility] Urology Provider [name] to me for evaluation of clinical prostate cancer and management.  He has a history of elevated PSA and BPH. PSA trends as follows: 11/14/2014 4.3, 11/07/2015 7.1, 05/11/2016 10.4, 06/11/2016 10.6, 07/07/2016 7.8, 01/06/2017 86.8.  He was seen by urology and was diagnosed with clinical prostate cancer given the extreme elevation of PSA and prostate nodule. Biopsy was not pursued. Bone scan showed multiple abnormal foci of increased osseous tracer accumulation compatible with widespread osseus metastatic disease. CT abdomen showed no evidence of metastatic disease, left adrenal nodule is indeterminate by relative washout characteristics but appears grossly stable in size from 2008, favoring adenoma.   Patient was accompanied by his wife today to clinic. He feels well and has not compliant. With further questioning, he mentioned he occationaly has "soreness" of his mid back, but no persistent pain. He also does not eat much, and is taking marijuana two time a day for appetite stimulant.  He has BPH symptoms. He remains active and able to do ADLs.  Quit smoking and ETOH use many years ago.   INTERVAL HISTORY Patient presents to discuss pathology results. He underwent prostate biopsy on 03/10/2017. He reports having left hip pain today which was relieved by tramadol.  He denies any other symptoms today.    ROS:  Review of Systems  Constitutional: Positive for appetite change.  Eyes: Negative.   Respiratory: Negative.   Cardiovascular: Negative.   Gastrointestinal: Negative.   Endocrine:  Negative.   Genitourinary: Positive for difficulty urinating, frequency and nocturia.   Musculoskeletal: Negative.   Skin: Negative.   Neurological: Negative.   Hematological: Negative.   Psychiatric/Behavioral: Negative.     MEDICAL HISTORY:  Past Medical History:  Diagnosis Date  . Arthritis   . Benign enlargement of prostate   . CAD (coronary artery disease)   . Cancer ([place])   . Constipation   . Elevated PSA   . GERD (gastroesophageal reflux disease)   . Hypertension   . Incomplete bladder emptying   . Nocturia   . Urgency of micturation   . Urinary frequency     SURGICAL HISTORY: Past Surgical History:  Procedure Laterality Date  . GALLBLADDER SURGERY    . PROSTATE SURGERY    . STOMACH SURGERY      SOCIAL HISTORY: Social History   Socioeconomic History  . Marital status: Married    Spouse name: Not on file  . Number of children: Not on file  . Years of education: Not on file  . Highest education level: Not on file  Social Needs  . Financial resource strain: Not on file  . Food insecurity - worry: Not on file  . Food insecurity - inability: Not on file  . Transportation needs - medical: Not on file  . Transportation needs - non-medical: Not on file  Occupational History  . Not on file  Tobacco Use  . Smoking status: Former Smoker    Last attempt to quit: 06/01/1998    Years since quitting: 18.9  . Smokeless tobacco: Never Used  Substance and Sexual Activity  . Alcohol use: No    Alcohol/week: 0.0 oz  . Drug use: Yes  Frequency: 2.0 times per week    Comment: marijuana two time monthly gives him an appetite  . Sexual activity: Not on file  Other Topics Concern  . Not on file  Social History Narrative  . Not on file    FAMILY HISTORY: Family History  Problem Relation Age of Onset  . Hypertension Mother   . Stroke Father   . Kidney disease Neg Hx   . Prostate cancer Neg Hx   . Kidney cancer Neg Hx   . Bladder Cancer Neg Hx     ALLERGIES:   has No Known Allergies.  MEDICATIONS:  Current Outpatient Medications  Medication Sig Dispense Refill  . aspirin EC 81 MG tablet Take by mouth.    Marland Kitchen atenolol (TENORMIN) 100 MG tablet Take 100 mg by mouth daily.     Marland Kitchen CALCIUM-VITAMIN D PO Take by mouth.    . dutasteride (AVODART) 0.5 MG capsule Take 1 capsule (0.5 mg total) by mouth daily. 30 capsule 0  . finasteride (PROSCAR) 5 MG tablet Take 1 tablet (5 mg total) by mouth daily. 90 tablet 3  . nortriptyline (PAMELOR) 25 MG capsule Reported on 05/16/2015  1  . omeprazole (PRILOSEC) 20 MG capsule Take 20 mg by mouth daily.    Marland Kitchen pyridOXINE (VITAMIN B-6) 100 MG tablet Take 100 mg by mouth daily.    . tamsulosin (FLOMAX) 0.4 MG CAPS capsule Take 1 capsule (0.4 mg total) by mouth daily. 90 capsule 3  . Telmisartan-Amlodipine 80-5 MG TABS Take by mouth daily.    . traMADol (ULTRAM) 50 MG tablet Take 50 mg every 6 (six) hours as needed by mouth.      No current facility-administered medications for this visit.       Marland Kitchen  PHYSICAL EXAMINATION: ECOG PERFORMANCE STATUS: 0 - Asymptomatic Vitals:   05/03/17 1028  BP: 112/74  Pulse: 85  Resp: 14  Temp: (!) 97.5 F (36.4 C)   Filed Weights   05/03/17 1028  Weight: 189 lb (85.7 kg)   Physical Exam  Constitutional: He is oriented to person, place, and time. He appears well-developed and well-nourished. No distress.  HENT:  Head: Normocephalic and atraumatic.  Eyes: EOM are normal. Pupils are equal, round, and reactive to light.  Neck: Normal range of motion. Neck supple.  Cardiovascular: Normal rate, regular rhythm and normal heart sounds.  Pulmonary/Chest: Effort normal and breath sounds normal. No respiratory distress.  Abdominal: Soft. Bowel sounds are normal.  Musculoskeletal: Normal range of motion. He exhibits no edema.  Neurological: He is alert and oriented to person, place, and time.  Skin: Skin is warm.  Psychiatric: He has a normal mood and affect.  LABORATORY DATA:  I  have reviewed the data as listed Lab Results  Component Value Date   WBC 7.7 05/03/2017   HGB 13.1 05/03/2017   HCT 39.0 (L) 05/03/2017   MCV 84.1 05/03/2017   PLT 210 05/03/2017   Recent Labs    07/10/16 1020 01/14/17 0837 02/24/17 0920  NA 134*  --  137  K 5.5*  --  4.2  CL 101  --  105  CO2 27  --  26  GLUCOSE 141*  --  111*  BUN 15 11 17   CREATININE 1.51* 1.05 1.19  CALCIUM 9.7  --  10.0  GFRNONAA 41* 65 54*  GFRAA 47* 75 >60  PROT  --   --  8.4*  ALBUMIN  --   --  4.1  AST  --   --  26  ALT  --   --  17  ALKPHOS  --   --  101  BILITOT  --   --  1.4*    RADIOGRAPHIC STUDIES: I have personally reviewed the radiological images as listed and agreed with the findings in the report. 02/03/2017  Bone scan IMPRESSION: Multiple abnormal foci of with of increased osseous tracer accumulation compatible with widespread osseous metastatic disease as above.  02/03/2017 CT abdomen pelvis w contrast.  IMPRESSION: 1. No evidence of metastatic disease. 2. Left adrenal nodule is indeterminate by relative washout characteristics but appears grossly stable in size from 02/28/2007, favoring a benign adenoma. 3. Left renal stone. 4.  Aortic atherosclerosis (ICD10-170.0).   03/10/2017 PART A: Prostate CNB, Left, Base  PART B: Prostate CNB, Left, Mid  PART C: Prostate CNB, Left, Apex  PART D: Prostate CNB, Right, Base  PART E: Prostate CNB, Right, Mid  PART F: Prostate CNB, Right, Apex   Comment Comment   Comment: Clinical history:                     .  PSA 64.78, DRE Normal, Prev Bx Positive   Comment Comment   Diagnosis:  A. PROSTATIC ADENOCARCINOMA. GLEASON'S SCORE 8 (GRADES 4 + 4) NOTED IN 2  OUT OF 2 SUBMITTED PROSTATE CORE SEGMENTS. APPROXIMATELY 71% OF SUBMITTED  TISSUE INVOLVED. PERINEURAL INVASION IS PRESENT. (GRADE GROUP 4)  B. PROSTATIC ADENOCARCINOMA. GLEASON'S SCORE 9 (GRADES 4 + 5) NOTED IN 1  OUT OF 1 SUBMITTED PROSTATE CORE SEGMENTS.  APPROXIMATELY 55% OF SUBMITTED  TISSUE INVOLVED. (GRADE GROUP 5)  C. PROSTATIC ADENOCARCINOMA. GLEASON'S SCORE 8 (GRADES 4 + 4) NOTED IN 2  OUT OF 2 SUBMITTED PROSTATE CORE SEGMENTS. APPROXIMATELY 68% OF SUBMITTED  TISSUE INVOLVED. PERINEURAL INVASION IS PRESENT. (GRADE GROUP 4)  D. BENIGN PROSTATE TISSUE. NO EVIDENCE OF MALIGNANCY.  E. PROSTATIC ADENOCARCINOMA. GLEASON'S SCORE 9 (GRADES 4 + 5) NOTED IN 1  OUT OF 1 SUBMITTED PROSTATE CORE SEGMENTS. APPROXIMATELY 6% OF SUBMITTED  TISSUE INVOLVED. (GRADE GROUP 5)  F. BENIGN PROSTATE TISSUE. NO EVIDENCE OF MALIGNANCY.   . Comment:   Comment: A. Grade groups range from 1 (most favorable) to 5 (least favorable).  Pierorazio et al. BJU Int 111: 381-01, 2013. Epstein et al.EUR UROL 69:  428-35, 2016.  B. Grade groups range from 1 (most favorable) to 5 (least favorable).  Pierorazio et al. BJU Int 111: 751-02, 2013. Epstein et al.EUR UROL 69:  428-35, 2016.  C. Grade groups range from 1 (most favorable) to 5 (least favorable).  Pierorazio et al. BJU Int 111: 585-27, 2013. Epstein et al.EUR UROL 69:  428-35, 2016.  E. Grade groups range from 1 (most favorable) to 5 (least favorable).  Pierorazio et al. BJU Int 111: 782-42, 2013. Epstein et al.EUR UROL 69:  428-35, 2016.     ASSESSMENT & PLAN:  1. Prostate cancer ([place])   2. Elevated PSA   3. Bone lesion     Discussed about his prostate biopsy results. Clinically he has metastatic prostate cancer, castration resistant.  I discussed options of upfront docetaxel and he is not interested at this point given his age.  Discussed about starting him on Xtandi treatment. Will cbc, cmp, PSA today.  Side effects of Xtandi discussed with patient.  Continue ADT. # Advise patient to obtain dental clearance for starting Zometa monthly.  All questions were answered. The patient knows to call the clinic with any problems questions or concerns.  Return of visit: tentatively 4 weeks with repeat  labs.   Earlie Server, MD, PhD Hematology Oncology [facility] Pager- [phone] 05/03/2017

## 2017-05-03 ENCOUNTER — Encounter: Payer: Self-pay | Admitting: Oncology

## 2017-05-03 ENCOUNTER — Inpatient Hospital Stay: Payer: Medicare Other

## 2017-05-03 ENCOUNTER — Inpatient Hospital Stay: Payer: Medicare Other | Attending: Oncology | Admitting: Oncology

## 2017-05-03 VITALS — BP 112/74 | HR 85 | Temp 97.5°F | Resp 14 | Wt 189.0 lb

## 2017-05-03 DIAGNOSIS — I2510 Atherosclerotic heart disease of native coronary artery without angina pectoris: Secondary | ICD-10-CM | POA: Diagnosis not present

## 2017-05-03 DIAGNOSIS — Z79899 Other long term (current) drug therapy: Secondary | ICD-10-CM | POA: Diagnosis not present

## 2017-05-03 DIAGNOSIS — Z87891 Personal history of nicotine dependence: Secondary | ICD-10-CM | POA: Insufficient documentation

## 2017-05-03 DIAGNOSIS — C61 Malignant neoplasm of prostate: Secondary | ICD-10-CM

## 2017-05-03 DIAGNOSIS — N401 Enlarged prostate with lower urinary tract symptoms: Secondary | ICD-10-CM | POA: Diagnosis not present

## 2017-05-03 DIAGNOSIS — C7951 Secondary malignant neoplasm of bone: Secondary | ICD-10-CM | POA: Insufficient documentation

## 2017-05-03 DIAGNOSIS — K219 Gastro-esophageal reflux disease without esophagitis: Secondary | ICD-10-CM | POA: Diagnosis not present

## 2017-05-03 DIAGNOSIS — I10 Essential (primary) hypertension: Secondary | ICD-10-CM | POA: Insufficient documentation

## 2017-05-03 DIAGNOSIS — M25552 Pain in left hip: Secondary | ICD-10-CM | POA: Insufficient documentation

## 2017-05-03 DIAGNOSIS — R351 Nocturia: Secondary | ICD-10-CM

## 2017-05-03 DIAGNOSIS — R972 Elevated prostate specific antigen [PSA]: Secondary | ICD-10-CM

## 2017-05-03 DIAGNOSIS — Z7982 Long term (current) use of aspirin: Secondary | ICD-10-CM | POA: Diagnosis not present

## 2017-05-03 DIAGNOSIS — M899 Disorder of bone, unspecified: Secondary | ICD-10-CM

## 2017-05-03 HISTORY — DX: Malignant neoplasm of prostate: C61

## 2017-05-03 LAB — CBC WITH DIFFERENTIAL/PLATELET
BASOS ABS: 0 10*3/uL (ref 0–0.1)
Basophils Relative: 0 %
Eosinophils Absolute: 0.1 10*3/uL (ref 0–0.7)
Eosinophils Relative: 1 %
HEMATOCRIT: 39 % — AB (ref 40.0–52.0)
HEMOGLOBIN: 13.1 g/dL (ref 13.0–18.0)
LYMPHS PCT: 28 %
Lymphs Abs: 2.1 10*3/uL (ref 1.0–3.6)
MCH: 28.3 pg (ref 26.0–34.0)
MCHC: 33.7 g/dL (ref 32.0–36.0)
MCV: 84.1 fL (ref 80.0–100.0)
Monocytes Absolute: 0.7 10*3/uL (ref 0.2–1.0)
Monocytes Relative: 9 %
NEUTROS ABS: 4.7 10*3/uL (ref 1.4–6.5)
Neutrophils Relative %: 62 %
Platelets: 210 10*3/uL (ref 150–440)
RBC: 4.63 MIL/uL (ref 4.40–5.90)
RDW: 14.3 % (ref 11.5–14.5)
WBC: 7.7 10*3/uL (ref 3.8–10.6)

## 2017-05-03 LAB — COMPREHENSIVE METABOLIC PANEL
ALBUMIN: 4.1 g/dL (ref 3.5–5.0)
ALT: 15 U/L — ABNORMAL LOW (ref 17–63)
ANION GAP: 7 (ref 5–15)
AST: 26 U/L (ref 15–41)
Alkaline Phosphatase: 82 U/L (ref 38–126)
BILIRUBIN TOTAL: 1.1 mg/dL (ref 0.3–1.2)
BUN: 11 mg/dL (ref 6–20)
CO2: 27 mmol/L (ref 22–32)
Calcium: 9.7 mg/dL (ref 8.9–10.3)
Chloride: 103 mmol/L (ref 101–111)
Creatinine, Ser: 1.1 mg/dL (ref 0.61–1.24)
GFR calc Af Amer: >60 mL/min (ref >60)
GFR, EST NON AFRICAN AMERICAN: 59 mL/min — AB (ref >60)
GLUCOSE: 94 mg/dL (ref 65–99)
POTASSIUM: 4.6 mmol/L (ref 3.5–5.1)
Sodium: 137 mmol/L (ref 135–145)
TOTAL PROTEIN: 8.1 g/dL (ref 6.5–8.1)

## 2017-05-03 LAB — PSA: Prostatic Specific Antigen: 242 ng/mL — ABNORMAL HIGH (ref 0.00–4.00)

## 2017-05-03 MED ORDER — ENZALUTAMIDE 40 MG PO CAPS: 160.0000 mg | ORAL_CAPSULE | Freq: Every day | ORAL | 0 refills | Status: DC

## 2017-05-03 NOTE — Progress Notes (Signed)
10:51 AM   Matthew Hunter 12/05/1929 [id number]  Referring provider: Maryland Pink, MD [address] [facility] [place], [place] [address]  Chief Complaint  Patient presents with  . Prostate Cancer    HPI: Patient is an 83 year old African-American male with metastatic prostate cancer who presents today for a 3 week follow up after a trial of Myrbetriq.    Castrate resistant prostate cancer Contrast CT noted no evidence of metastatic disease.  Left adrenal nodule is indeterminate by relative washout characteristics but appears grossly stable in size from 02/28/2007, favoring a benign adenoma.  Left renal stone.  Aortic atherosclerosis.  Bone scan noted multiple abnormal foci of with of increased osseous tracer accumulation compatible with widespread osseous metastatic disease.  Patient has been seen and evaluated by the cancer center. He is to undergo a prostate biopsy for pathological confirmation for prostate cancer.  He underwent a biopsy on 03/10/2017 with Dr. Junious Silk. Pathology was positive for six-core Biopsy 03/10/2017 revealed a 35 gram prostate with Gleason 4+4=8, 2 cores, left base, left apex and Gleason 4+5=9, two cores, left mid and right mid. 4/6 cores positive.   His most recent PSA was 131.0 ng/mL on 04/12/2017.     BPH WITH LUTS His IPSS score is 8, which is moderate lower urinary tract symptomatology.  He is mostly satisfied with his quality life due to his urinary symptoms.  His PVR is 15 mL.  His previous IPSS score was 14/3.  His previous PVR was 73 mL.  His major complaints are nocturia and intermittency.  He denies any dysuria, hematuria or suprapubic pain.  He currently taking Myrbetriq, tamsulosin, avodart and finasteride.  His has had TUMT about 7 years ago.  He also denies any recent fevers, chills, nausea or vomiting.  He does not have a family history of PCa.  IPSS    Row Name 04/12/17 1000 05/04/17 1000       International Prostate Symptom Score   How often have you had the sensation of not emptying your bladder?  About half the time  Less than 1 in 5    How often have you had to urinate less than every two hours?  About half the time  Less than 1 in 5 times    How often have you found you stopped and started again several times when you urinated?  Less than 1 in 5 times  Less than 1 in 5 times    How often have you found it difficult to postpone urination?  Less than 1 in 5 times  Less than 1 in 5 times    How often have you had a weak urinary stream?  About half the time  Less than 1 in 5 times    How often have you had to strain to start urination?  Less than 1 in 5 times  Less than 1 in 5 times    How many times did you typically get up at night to urinate?  2 Times  2 Times    Total IPSS Score  14  8      Quality of Life due to urinary symptoms   If you were to spend the rest of your life with your urinary condition just the way it is now how would you feel about that?  Mixed  Mostly Satisfied       Score:  1-7 Mild 8-19 Moderate 20-35 Severe   PMH: Past Medical History:  Diagnosis Date  .  Arthritis   . Benign enlargement of prostate   . CAD (coronary artery disease)   . Cancer ([place])   . Constipation   . Elevated PSA   . GERD (gastroesophageal reflux disease)   . Hypertension   . Incomplete bladder emptying   . Nocturia   . Prostate cancer ([place]) 05/03/2017  . Urgency of micturation   . Urinary frequency     Surgical History: Past Surgical History:  Procedure Laterality Date  . GALLBLADDER SURGERY    . PROSTATE SURGERY    . STOMACH SURGERY      Home Medications:  Allergies as of 05/04/2017   No Known Allergies     Medication List        Accurate as of 05/04/17 10:51 AM. Always use your most recent med list.          aspirin EC 81 MG tablet Take 81 mg by mouth daily.   atenolol 100 MG tablet Commonly known as:  TENORMIN Take 100 mg by mouth daily.   CALCIUM-VITAMIN D PO Take 1 tablet by mouth  2 (two) times daily.   dutasteride 0.5 MG capsule Commonly known as:  AVODART Take 1 capsule (0.5 mg total) by mouth daily.   enzalutamide 40 MG capsule Commonly known as:  XTANDI Take 4 capsules (160 mg total) by mouth daily.   finasteride 5 MG tablet Commonly known as:  PROSCAR Take 1 tablet (5 mg total) by mouth daily.   MYRBETRIQ 25 MG Tb24 tablet Generic drug:  mirabegron ER Take 25 mg by mouth daily.   nortriptyline 25 MG capsule Commonly known as:  PAMELOR Take 25 mg by mouth at bedtime. Reported on 05/16/2015   omeprazole 20 MG capsule Commonly known as:  PRILOSEC Take 20 mg by mouth daily.   pyridOXINE 50 MG tablet Commonly known as:  VITAMIN B-6 Take 50 mg by mouth daily.   tamsulosin 0.4 MG Caps capsule Commonly known as:  FLOMAX Take 1 capsule (0.4 mg total) by mouth daily.   Telmisartan-Amlodipine 80-5 MG Tabs Take 1 tablet by mouth daily.   traMADol 50 MG tablet Commonly known as:  ULTRAM Take 50 mg every 6 (six) hours as needed by mouth.       Allergies: No Known Allergies  Family History: Family History  Problem Relation Age of Onset  . Hypertension Mother   . Stroke Father   . Kidney disease Neg Hx   . Prostate cancer Neg Hx   . Kidney cancer Neg Hx   . Bladder Cancer Neg Hx     Social History:  reports that he quit smoking about 18 years ago. he has never used smokeless tobacco. He reports that he uses drugs. Frequency: 2.00 times per week. He reports that he does not drink alcohol.  ROS: UROLOGY Frequent Urination?: No Hard to postpone urination?: No Burning/pain with urination?: No Get up at night to urinate?: Yes Leakage of urine?: No Urine stream starts and stops?: Yes Trouble starting stream?: No Do you have to strain to urinate?: No Blood in urine?: No Urinary tract infection?: No Sexually transmitted disease?: No Injury to kidneys or bladder?: No Painful intercourse?: No Weak stream?: No Erection problems?: No Penile  pain?: No Gastrointestinal Nausea?: No Vomiting?: No Indigestion/heartburn?: No Diarrhea?: No Constipation?: Yes Constitutional Fever: No Night sweats?: Yes Weight loss?: No Fatigue?: No Skin Skin rash/lesions?: No Itching?: No Eyes Blurred vision?: No Double vision?: No Ears/Nose/Throat Sore throat?: No Sinus problems?: No Hematologic/Lymphatic Swollen glands?: No  Easy bruising?: No Cardiovascular Leg swelling?: No Chest pain?: No Respiratory Cough?: No Shortness of breath?: No Endocrine Excessive thirst?: No Musculoskeletal Back pain?: Yes Joint pain?: Yes Neurological Headaches?: No Dizziness?: No Psychologic Depression?: No Anxiety?: No   Physical Exam: BP 117/70   Pulse 67   Ht 5\' 9"  (1.753 m)   Wt 183 lb 4.8 oz (83.1 kg)   BMI 27.07 kg/m   Constitutional: Well nourished. Alert and oriented, No acute distress. HEENT: [place] AT, moist mucus membranes. Trachea midline, no masses. Cardiovascular: No clubbing, cyanosis, or edema. Respiratory: Normal respiratory effort, no increased work of breathing. Skin: No rashes, bruises or suspicious lesions. Lymph: No cervical or inguinal adenopathy. Neurologic: Grossly intact, no focal deficits, moving all 4 extremities. Psychiatric: Normal mood and affect.  Laboratory Data: PSA History:     2.66 ng/mL on 11/17/2011     4.54 ng/mL on 12/07/2013     3.3 ng/mL on 04/09/2014     4.3 ng/mL on 10/2014     6.3 ng/mL on 04/2015    7.1 ng/mL on 11/07/2015  10.4 ng/mL on 05/11/2016  7.8 ng/mL on 07/07/2016  86.8 ng/mL on 01/06/2017  64.78 ng/mL on 02/24/2017  Results for orders placed or performed in visit on 05/04/17  Bladder Scan (Post Void Residual) in office  Result Value Ref Range   Scan Result 15 ml    Lab Results  Component Value Date   WBC 7.7 05/03/2017   HGB 13.1 05/03/2017   HCT 39.0 (L) 05/03/2017   MCV 84.1 05/03/2017   PLT 210 05/03/2017    Lab Results  Component Value Date   CREATININE  1.10 05/03/2017   I have reviewed the labs  Pertinent imaging CLINICAL DATA:  Prostate cancer, staging.  EXAM: CT ABDOMEN AND PELVIS WITH CONTRAST  TECHNIQUE: Multidetector CT imaging of the abdomen and pelvis was performed using the standard protocol following bolus administration of intravenous contrast.  CONTRAST:  134mL ISOVUE-300 IOPAMIDOL (ISOVUE-300) INJECTION 61%  COMPARISON:  02/28/2007.  FINDINGS: Lower chest: Lung bases show no acute findings. Heart is mildly enlarged. No pericardial or pleural effusion distal esophagus is air-filled.  Hepatobiliary: Subcentimeter low-attenuation lesions in the liver are too small to characterize. Cholecystectomy. No biliary ductal dilatation.  Pancreas: Negative.  Spleen: Negative.  Adrenals/Urinary Tract: Right adrenal gland is unremarkable. Left adrenal gland nodule measures 1.5 cm with 30% relative washout. Size is grossly stable from 02/28/2007. Right kidney is unremarkable. Low-attenuation lesions in the left kidney measure up to 2.0 cm and are likely cysts. A stone is seen in the lower pole left kidney. Ureters are decompressed. Bladder is grossly unremarkable.  Stomach/Bowel: Stomach, small bowel, appendix and colon are unremarkable. Likely fecal material in the rectum, rather than a true polyp.  Vascular/Lymphatic: Atherosclerotic calcification of the arterial vasculature without abdominal aortic aneurysm. No pathologically enlarged lymph nodes.  Reproductive: Prostate is visualized and appears mildly heterogeneous.  Other: No free fluid.  Mesenteries and peritoneum are unremarkable.  Musculoskeletal: No worrisome lytic or sclerotic lesions. Degenerative changes in the spine. Old left rib fracture.  IMPRESSION: 1. No evidence of metastatic disease. 2. Left adrenal nodule is indeterminate by relative washout characteristics but appears grossly stable in size from 02/28/2007, favoring a  benign adenoma. 3. Left renal stone. 4.  Aortic atherosclerosis (ICD10-170.0).   Electronically Signed   By: Lorin Picket M.D.   On: 02/03/2017 13:12  CLINICAL DATA:  Prostate cancer, high risk treatment, low back pain  EXAM: NUCLEAR MEDICINE WHOLE  BODY BONE SCAN  TECHNIQUE: Whole body anterior and posterior images were obtained approximately 3 hours after intravenous injection of radiopharmaceutical.  RADIOPHARMACEUTICALS:  22.96 mCi Technetium-87m MDP IV  COMPARISON:  None  Radiographic correlation:  CT abdomen and pelvis 02/03/2017  FINDINGS: Numerous foci of abnormal osseous tracer accumulation are identified in a pattern most consistent with osseous metastatic disease.  These include anterior and posterior ribs bilaterally, proximal LEFT humeral metaphysis, RIGHT scapula, question LEFT scapula, RIGHT temporal bone, thoracic spine, proximal LEFT femur at the lesser trochanter and intertrochanteric region, in the pelvis, and questionably at the mid to distal LEFT femoral diaphysis.  Focus of abnormal tracer localization LEFT supraclavicular, probably soft tissue, nonspecific; this is opposite these site of probable injection, could represent abnormal lymph node, soft tissue calcification, less likely arising from the anterior aspect of the medial border of the LEFT scapula.  Otherwise expected urinary tract and soft tissue distribution of tracer.  IMPRESSION: Multiple abnormal foci of with of increased osseous tracer accumulation compatible with widespread osseous metastatic disease as above.   Electronically Signed   By: Lavonia Dana M.D.   On: 02/03/2017 14:55  Assessment & Plan:    1. Metastatic castrate resistant prostate cancer   - Patient's most recent PSA of 131 on 04/23/2017  - biopsy positive for Gleason's 9 - patient will be starting Xtandi and once cleared from the dentist will start Zometa - has a tentative follow up in 4 weeks  with Dr. Tasia Catchings  - RTC in 6 months for next Lupron  2. LUTS  - IPSS score is 8/2, it is improving  - Continue conservative management, avoiding bladder irritants and timed voiding's - drinking a lot of soda  - Continue tamsulosin 0.4 mg daily and Myrbetriq; discontinue the dutasteride finasteride  - discontinue the finasteride   - RTC in 6 months for I PSS and PVR   Royden Purl  [facility] [address] [place], [place] [address] [phone]

## 2017-05-03 NOTE — Progress Notes (Signed)
Prostate - No Medical Intervention - Off Treatment.  Patient Characteristics: Adenocarcinoma, Metastatic, Castration Resistant, Symptomatic, Prior Docetaxel/Docetaxel Ineligible Current radiographic evidence of distant metastasis<= Yes Histology: Adenocarcinoma AJCC T Category: cTX Gleason Primary: 4 AJCC N Category: NX Gleason Secondary: 5 AJCC M Category: M1b Gleason Score: 9 AJCC 8 Stage Grouping: IVB PSA Values (ng/mL): ? 20 Would you be surprised if this patient died  in the next year<= I would be surprised if this patient died in the next year

## 2017-05-03 NOTE — Progress Notes (Signed)
Patient here for follow up today with biopsy results. He states that he has developed pain in his left hip that started this morning. This is a new pain that he hasn't has before.

## 2017-05-03 NOTE — Progress Notes (Signed)
DISCONTINUE ON PATHWAY REGIMEN - Prostate  No Medical Intervention - Off Treatment.  REASON: Other Reason PRIOR TREATMENT: Prostate59: Referral For Radium 223  START ON PATHWAY REGIMEN - Prostate     Daily:     Enzalutamide   **Always confirm dose/schedule in your pharmacy ordering system**    Patient Characteristics: Adenocarcinoma, Metastatic, Castration Resistant, Asymptomatic/Minimally Symptomatic, First Line Current radiographic evidence of distant metastasis<= Yes Histology: Adenocarcinoma AJCC T Category: cTX Gleason Primary: 4 AJCC N Category: NX Gleason Secondary: 5 AJCC M Category: M1b Gleason Score: 9 AJCC 8 Stage Grouping: IVB PSA Values (ng/mL): ? 20 Would you be surprised if this patient died  in the next year<= I would be surprised if this patient died in the next year Line of Therapy: First Line Intent of Therapy: Non-Curative / Palliative Intent, Discussed with Patient

## 2017-05-04 ENCOUNTER — Encounter: Payer: Self-pay | Admitting: Urology

## 2017-05-04 ENCOUNTER — Telehealth: Payer: Self-pay | Admitting: Pharmacist

## 2017-05-04 ENCOUNTER — Ambulatory Visit: Payer: Medicare Other | Admitting: Urology

## 2017-05-04 ENCOUNTER — Telehealth: Payer: Self-pay | Admitting: Oncology

## 2017-05-04 VITALS — BP 117/70 | HR 67 | Ht 69.0 in | Wt 183.3 lb

## 2017-05-04 DIAGNOSIS — Z192 Hormone resistant malignancy status: Secondary | ICD-10-CM | POA: Diagnosis not present

## 2017-05-04 DIAGNOSIS — R399 Unspecified symptoms and signs involving the genitourinary system: Secondary | ICD-10-CM

## 2017-05-04 DIAGNOSIS — C7951 Secondary malignant neoplasm of bone: Secondary | ICD-10-CM

## 2017-05-04 DIAGNOSIS — C61 Malignant neoplasm of prostate: Secondary | ICD-10-CM | POA: Diagnosis not present

## 2017-05-04 LAB — BLADDER SCAN AMB NON-IMAGING

## 2017-05-04 MED ORDER — MIRABEGRON ER 25 MG PO TB24: 25.0000 mg | ORAL_TABLET | Freq: Every day | ORAL | 3 refills | Status: DC

## 2017-05-04 NOTE — Telephone Encounter (Addendum)
Oral Oncology Patient Advocate Encounter  Prior Authorization for Gillermina Phy has been approved.    PA# [id number] Effective dates: 05/04/2018 through 05/31/2018  Oral Oncology Clinic will continue to follow.   Patients co-pay is $2,088.89 per month.   [place] Patient Advocate [phone] 05/04/2017 9:47 AM

## 2017-05-04 NOTE — Telephone Encounter (Signed)
Oral Oncology Patient Advocate Encounter  Received notification from [facility] that prior authorization for Gillermina Phy is required.  PA submitted on CoverMyMeds Key [id number] Status is pending  Oral Oncology Clinic will continue to follow.   [place] Patient Advocate [phone] 05/04/2017 9:42 AM

## 2017-05-04 NOTE — Telephone Encounter (Signed)
Oral Oncology Patient Advocate Encounter  Met patient in lobby to complete application for [facility] [employer] in an effort to reduce patient's out of pocket expense for Xtandi to $0.    Application completed and faxed to [phone].   Patient assistance phone number for follow up is [phone].   This encounter will be updated until final determination.   [place] Patient Advocate [phone] 05/04/2017 9:56 AM

## 2017-05-04 NOTE — Telephone Encounter (Signed)
Oral Oncology Pharmacist Encounter  Received new prescription for Xtandi  for the treatment of castration resistant metastatic prostate cancer, planned duration until disease progression or unacceptable drug toxicity.  CBC/CMP from 05/03/17 assessed, no relevant lab abnormalities. Prescription dose and frequency assessed.   Current medication list in Epic reviewed, a few DDIs with Gillermina Phy identified: -Xtandi can decrease the concentrations of omeprazole, tamsulosin, telmisartan/amlodipine, and tramadol. Mr. Pasquariello should be monitored for decrease effect of the medications previously listed.   Due to high copay, patient will need to apply for manufacturer assistance. Patient advocate Tonye Pearson will help him with this process.   Oral Oncology Clinic will continue to follow for insurance authorization, copayment issues, initial counseling and start date.  Darl Pikes, PharmD, BCPS Hematology/Oncology Clinical Pharmacist [facility] [place] [facility] [phone]  05/04/2017 9:40 AM

## 2017-05-04 NOTE — Patient Instructions (Addendum)
Avodart (dutasteride) and Proscar (finasteride) you can stop now.  Continue the Flomax (tamsulosin) and Myrbetriq (mirabegon)

## 2017-05-13 ENCOUNTER — Telehealth: Payer: Self-pay | Admitting: Oncology

## 2017-05-13 NOTE — Telephone Encounter (Signed)
Oral Oncology Patient Advocate Encounter  Received notification from [facility] Patient Assistance program that patient has been successfully enrolled into their program to receive Xtandi from the manufacturer at $0 out of pocket until 05/31/2018.   I called and spoke with patient.  He knows we will have to re-apply.   Patient knows to call the office with questions or concerns.  Oral Oncology Clinic will continue to follow.  [place] Patient Advocate [phone] 05/13/2017 10:33 AM

## 2017-05-14 NOTE — Telephone Encounter (Signed)
Oral Oncology Patient Advocate Encounter  Called [place] to see if Matthew Hunter had been mailed out. It was mailed today he will get Tuesday.  [place] Patient Advocate [phone] 05/14/2017 1:37 PM

## 2017-06-01 NOTE — Progress Notes (Signed)
Hematology/Oncology  Follow up note [facility] [phone] [phone]) [phone]  Patient Care Team: Maryland Pink, MD as PCP - General (Family Medicine)  CHIEF COMPLAINTS/PURPOSE OF CONSULTATION:  Prostate Cancer  HISTORY OF PRESENTING ILLNESS:  Matthew Hunter 84 y.o.  male with PMH listed as below who was referred by [facility] Provider [name] to me for evaluation of clinical prostate cancer and management.  He has a history of elevated PSA and BPH. PSA trends as follows: 11/14/2014 4.3, 11/07/2015 7.1, 05/11/2016 10.4, 06/11/2016 10.6, 07/07/2016 7.8, 01/06/2017 86.8.  He was seen by urology and was diagnosed with clinical prostate cancer given the extreme elevation of PSA and prostate nodule. Biopsy was not pursued. Bone scan showed multiple abnormal foci of increased osseous tracer accumulation compatible with widespread osseus metastatic disease. CT abdomen showed no evidence of metastatic disease, left adrenal nodule is indeterminate by relative washout characteristics but appears grossly stable in size from 2008, favoring adenoma.   Patient was accompanied by his wife today to clinic. He feels well and has not compliant. With further questioning, he mentioned he occationaly has "soreness" of his mid back, but no persistent pain. He also does not eat much, and is taking marijuana two time a day for appetite stimulant.  He has BPH symptoms. He remains active and able to do ADLs.  Quit smoking and ETOH use many years ago.   INTERVAL HISTORY Patient presents to follow up on management of prostate cancer. He was started on Xtandi 2 weeks ago. He reports feeling fine today and did not have any concerning side effects. He has intermittent lower back/Hip soreness for which he takes tramadol as needed.    ROS:  Review of Systems  Constitutional: Negative for appetite change.  Eyes: Negative.   Respiratory: Negative.   Cardiovascular: Negative.     Gastrointestinal: Negative.   Endocrine: Negative.   Genitourinary: Positive for difficulty urinating, frequency and nocturia.   Musculoskeletal: Negative.   Skin: Negative.   Neurological: Negative.   Hematological: Negative.   Psychiatric/Behavioral: Negative.     MEDICAL HISTORY:  Past Medical History:  Diagnosis Date  . Arthritis   . Benign enlargement of prostate   . CAD (coronary artery disease)   . Cancer ([place])   . Constipation   . Elevated PSA   . GERD (gastroesophageal reflux disease)   . Hypertension   . Incomplete bladder emptying   . Nocturia   . Prostate cancer ([place]) 05/03/2017  . Urgency of micturation   . Urinary frequency     SURGICAL HISTORY: Past Surgical History:  Procedure Laterality Date  . GALLBLADDER SURGERY    . PROSTATE SURGERY    . STOMACH SURGERY      SOCIAL HISTORY: Social History   Socioeconomic History  . Marital status: Married    Spouse name: Not on file  . Number of children: Not on file  . Years of education: Not on file  . Highest education level: Not on file  Social Needs  . Financial resource strain: Not on file  . Food insecurity - worry: Not on file  . Food insecurity - inability: Not on file  . Transportation needs - medical: Not on file  . Transportation needs - non-medical: Not on file  Occupational History  . Not on file  Tobacco Use  . Smoking status: Former Smoker    Last attempt to quit: 06/01/1998    Years since quitting: 19.0  . Smokeless tobacco: Never Used  Substance and Sexual Activity  . Alcohol use: No    Alcohol/week: 0.0 oz  . Drug use: Yes    Frequency: 2.0 times per week    Comment: marijuana two time monthly gives him an appetite  . Sexual activity: Not on file  Other Topics Concern  . Not on file  Social History Narrative  . Not on file    FAMILY HISTORY: Family History  Problem Relation Age of Onset  . Hypertension Mother   . Stroke Father   . Kidney disease Neg Hx   . Prostate  cancer Neg Hx   . Kidney cancer Neg Hx   . Bladder Cancer Neg Hx     ALLERGIES:  has No Known Allergies.  MEDICATIONS:  Current Outpatient Medications  Medication Sig Dispense Refill  . aspirin EC 81 MG tablet Take 81 mg by mouth daily.     Marland Kitchen atenolol (TENORMIN) 100 MG tablet Take 100 mg by mouth daily.     Marland Kitchen CALCIUM-VITAMIN D PO Take 1 tablet by mouth 2 (two) times daily.     Marland Kitchen dutasteride (AVODART) 0.5 MG capsule Take 1 capsule (0.5 mg total) by mouth daily. 30 capsule 0  . enzalutamide (XTANDI) 40 MG capsule Take 4 capsules (160 mg total) by mouth daily. 120 capsule 0  . finasteride (PROSCAR) 5 MG tablet Take 1 tablet (5 mg total) by mouth daily. 90 tablet 3  . mirabegron ER (MYRBETRIQ) 25 MG TB24 tablet Take 1 tablet (25 mg total) by mouth daily. 90 tablet 3  . nortriptyline (PAMELOR) 25 MG capsule Take 25 mg by mouth at bedtime. Reported on 05/16/2015  1  . omeprazole (PRILOSEC) 20 MG capsule Take 20 mg by mouth daily.    Marland Kitchen pyridOXINE (VITAMIN B-6) 50 MG tablet Take 50 mg by mouth daily.     . tamsulosin (FLOMAX) 0.4 MG CAPS capsule Take 1 capsule (0.4 mg total) by mouth daily. 90 capsule 3  . Telmisartan-Amlodipine 80-5 MG TABS Take 1 tablet by mouth daily.     . traMADol (ULTRAM) 50 MG tablet Take 50 mg every 6 (six) hours as needed by mouth.      No current facility-administered medications for this visit.       Marland Kitchen  PHYSICAL EXAMINATION: ECOG PERFORMANCE STATUS: 0 - Asymptomatic Vitals:   06/02/17 1051  BP: (!) 156/77  Pulse: 78  Resp: 18  Temp: (!) 95.7 F (35.4 C)   Filed Weights   06/02/17 1051  Weight: 186 lb 8 oz (84.6 kg)   Physical Exam  Constitutional: He is oriented to person, place, and time. He appears well-developed and well-nourished. No distress.  HENT:  Head: Normocephalic and atraumatic.  Eyes: EOM are normal. Pupils are equal, round, and reactive to light.  Neck: Normal range of motion. Neck supple.  Cardiovascular: Normal rate, regular rhythm  and normal heart sounds.  Pulmonary/Chest: Effort normal and breath sounds normal. No respiratory distress.  Abdominal: Soft. Bowel sounds are normal.  Musculoskeletal: Normal range of motion. He exhibits no edema.  Neurological: He is alert and oriented to person, place, and time.  Skin: Skin is warm.  Psychiatric: He has a normal mood and affect.   LABORATORY DATA:  I have reviewed the data as listed Lab Results  Component Value Date   WBC 7.0 06/02/2017   HGB 13.0 06/02/2017   HCT 38.9 (L) 06/02/2017   MCV 85.3 06/02/2017   PLT 229 06/02/2017   Recent Labs    02/24/17  0920 05/03/17 1139 06/02/17 1147  NA 137 137 139  K 4.2 4.6 4.1  CL 105 103 106  CO2 26 27 26   GLUCOSE 111* 94 94  BUN 17 11 10   CREATININE 1.19 1.10 1.13  CALCIUM 10.0 9.7 9.6  GFRNONAA 54* 59* 57*  GFRAA >60 >60 >60  PROT 8.4* 8.1 7.7  ALBUMIN 4.1 4.1 4.0  AST 26 26 26   ALT 17 15* 15*  ALKPHOS 101 82 82  BILITOT 1.4* 1.1 1.0    RADIOGRAPHIC STUDIES: I have personally reviewed the radiological images as listed and agreed with the findings in the report. 02/03/2017  Bone scan IMPRESSION: Multiple abnormal foci of with of increased osseous tracer accumulation compatible with widespread osseous metastatic disease as above.  02/03/2017 CT abdomen pelvis w contrast.  IMPRESSION: 1. No evidence of metastatic disease. 2. Left adrenal nodule is indeterminate by relative washout characteristics but appears grossly stable in size from 02/28/2007, favoring a benign adenoma. 3. Left renal stone. 4.  Aortic atherosclerosis (ICD10-170.0).   03/10/2017 PART A: Prostate CNB, Left, Base  PART B: Prostate CNB, Left, Mid  PART C: Prostate CNB, Left, Apex  PART D: Prostate CNB, Right, Base  PART E: Prostate CNB, Right, Mid  PART F: Prostate CNB, Right, Apex   Comment Comment   Comment: Clinical history:                     .  PSA 64.78, DRE Normal, Prev Bx Positive   Comment Comment     Diagnosis:  A. PROSTATIC ADENOCARCINOMA. GLEASON'S SCORE 8 (GRADES 4 + 4) NOTED IN 2  OUT OF 2 SUBMITTED PROSTATE CORE SEGMENTS. APPROXIMATELY 71% OF SUBMITTED  TISSUE INVOLVED. PERINEURAL INVASION IS PRESENT. (GRADE GROUP 4)  B. PROSTATIC ADENOCARCINOMA. GLEASON'S SCORE 9 (GRADES 4 + 5) NOTED IN 1  OUT OF 1 SUBMITTED PROSTATE CORE SEGMENTS. APPROXIMATELY 55% OF SUBMITTED  TISSUE INVOLVED. (GRADE GROUP 5)  C. PROSTATIC ADENOCARCINOMA. GLEASON'S SCORE 8 (GRADES 4 + 4) NOTED IN 2  OUT OF 2 SUBMITTED PROSTATE CORE SEGMENTS. APPROXIMATELY 68% OF SUBMITTED  TISSUE INVOLVED. PERINEURAL INVASION IS PRESENT. (GRADE GROUP 4)  D. BENIGN PROSTATE TISSUE. NO EVIDENCE OF MALIGNANCY.  E. PROSTATIC ADENOCARCINOMA. GLEASON'S SCORE 9 (GRADES 4 + 5) NOTED IN 1  OUT OF 1 SUBMITTED PROSTATE CORE SEGMENTS. APPROXIMATELY 6% OF SUBMITTED  TISSUE INVOLVED. (GRADE GROUP 5)  F. BENIGN PROSTATE TISSUE. NO EVIDENCE OF MALIGNANCY.   . Comment:   Comment: A. Grade groups range from 1 (most favorable) to 5 (least favorable).  Pierorazio et al. BJU Int 111: 027-74, 2013. Epstein et al.EUR UROL 69:  428-35, 2016.  B. Grade groups range from 1 (most favorable) to 5 (least favorable).  Pierorazio et al. BJU Int 111: 128-78, 2013. Epstein et al.EUR UROL 69:  428-35, 2016.  C. Grade groups range from 1 (most favorable) to 5 (least favorable).  Pierorazio et al. BJU Int 111: 676-72, 2013. Epstein et al.EUR UROL 69:  428-35, 2016.  E. Grade groups range from 1 (most favorable) to 5 (least favorable).  Pierorazio et al. BJU Int 111: 094-70, 2013. Epstein et al.EUR UROL 69:  428-35, 2016.      ASSESSMENT & PLAN: 84 yo male follows up for management of metastatic prostate cancer .  1. Prostate cancer metastatic to bone ([place])   2. Goals of care, counseling/discussion    # He appears tolerating Xtandi 160mg  daily. check cbc, cmp, PSA today.  PSAhas decreased  from 242 to 17.74 Continue ADT. # Advise patient to  obtain dental clearance for starting Zometa monthly.  # Hypertension: can be due to [place]. Ask patient to recheck again and report his reading.  # Refer to RadOnc for palliative RT All questions were answered. The patient knows to call the clinic with any problems questions or concerns.  Return of visit: 4 weeks with repeat labs.   Earlie Server, MD, PhD Hematology Oncology [facility] at [facility] Pager- [phone] 06/01/2017

## 2017-06-02 ENCOUNTER — Other Ambulatory Visit: Payer: Self-pay

## 2017-06-02 ENCOUNTER — Inpatient Hospital Stay: Payer: Medicare Other

## 2017-06-02 ENCOUNTER — Encounter: Payer: Self-pay | Admitting: Oncology

## 2017-06-02 ENCOUNTER — Inpatient Hospital Stay: Payer: Medicare Other | Attending: Oncology | Admitting: Oncology

## 2017-06-02 VITALS — BP 156/77 | HR 78 | Temp 95.7°F | Resp 18 | Wt 186.5 lb

## 2017-06-02 DIAGNOSIS — R972 Elevated prostate specific antigen [PSA]: Secondary | ICD-10-CM

## 2017-06-02 DIAGNOSIS — N400 Enlarged prostate without lower urinary tract symptoms: Secondary | ICD-10-CM | POA: Diagnosis not present

## 2017-06-02 DIAGNOSIS — I10 Essential (primary) hypertension: Secondary | ICD-10-CM

## 2017-06-02 DIAGNOSIS — Z87891 Personal history of nicotine dependence: Secondary | ICD-10-CM

## 2017-06-02 DIAGNOSIS — Z79899 Other long term (current) drug therapy: Secondary | ICD-10-CM | POA: Diagnosis not present

## 2017-06-02 DIAGNOSIS — C61 Malignant neoplasm of prostate: Secondary | ICD-10-CM

## 2017-06-02 DIAGNOSIS — Z7982 Long term (current) use of aspirin: Secondary | ICD-10-CM | POA: Diagnosis not present

## 2017-06-02 DIAGNOSIS — C7951 Secondary malignant neoplasm of bone: Secondary | ICD-10-CM | POA: Diagnosis not present

## 2017-06-02 DIAGNOSIS — I2510 Atherosclerotic heart disease of native coronary artery without angina pectoris: Secondary | ICD-10-CM | POA: Diagnosis not present

## 2017-06-02 DIAGNOSIS — Z7189 Other specified counseling: Secondary | ICD-10-CM

## 2017-06-02 DIAGNOSIS — Z7951 Long term (current) use of inhaled steroids: Secondary | ICD-10-CM | POA: Diagnosis not present

## 2017-06-02 DIAGNOSIS — K219 Gastro-esophageal reflux disease without esophagitis: Secondary | ICD-10-CM | POA: Diagnosis not present

## 2017-06-02 DIAGNOSIS — M899 Disorder of bone, unspecified: Secondary | ICD-10-CM

## 2017-06-02 DIAGNOSIS — M549 Dorsalgia, unspecified: Secondary | ICD-10-CM | POA: Diagnosis not present

## 2017-06-02 LAB — CBC WITH DIFFERENTIAL/PLATELET
Basophils Absolute: 0 10*3/uL (ref 0–0.1)
Basophils Relative: 1 %
EOS ABS: 0.1 10*3/uL (ref 0–0.7)
EOS PCT: 2 %
HCT: 38.9 % — ABNORMAL LOW (ref 40.0–52.0)
HEMOGLOBIN: 13 g/dL (ref 13.0–18.0)
LYMPHS ABS: 2.1 10*3/uL (ref 1.0–3.6)
LYMPHS PCT: 31 %
MCH: 28.4 pg (ref 26.0–34.0)
MCHC: 33.3 g/dL (ref 32.0–36.0)
MCV: 85.3 fL (ref 80.0–100.0)
MONOS PCT: 9 %
Monocytes Absolute: 0.6 10*3/uL (ref 0.2–1.0)
Neutro Abs: 4.1 10*3/uL (ref 1.4–6.5)
Neutrophils Relative %: 59 %
PLATELETS: 229 10*3/uL (ref 150–440)
RBC: 4.56 MIL/uL (ref 4.40–5.90)
RDW: 14.2 % (ref 11.5–14.5)
WBC: 7 10*3/uL (ref 3.8–10.6)

## 2017-06-02 LAB — COMPREHENSIVE METABOLIC PANEL
ALT: 15 U/L — AB (ref 17–63)
ANION GAP: 7 (ref 5–15)
AST: 26 U/L (ref 15–41)
Albumin: 4 g/dL (ref 3.5–5.0)
Alkaline Phosphatase: 82 U/L (ref 38–126)
BUN: 10 mg/dL (ref 6–20)
CHLORIDE: 106 mmol/L (ref 101–111)
CO2: 26 mmol/L (ref 22–32)
Calcium: 9.6 mg/dL (ref 8.9–10.3)
Creatinine, Ser: 1.13 mg/dL (ref 0.61–1.24)
GFR calc Af Amer: >60 mL/min (ref >60)
GFR calc non Af Amer: 57 mL/min — ABNORMAL LOW (ref >60)
Glucose, Bld: 94 mg/dL (ref 65–99)
Potassium: 4.1 mmol/L (ref 3.5–5.1)
SODIUM: 139 mmol/L (ref 135–145)
Total Bilirubin: 1 mg/dL (ref 0.3–1.2)
Total Protein: 7.7 g/dL (ref 6.5–8.1)

## 2017-06-02 LAB — PSA: PROSTATIC SPECIFIC ANTIGEN: 17.74 ng/mL — AB (ref 0.00–4.00)

## 2017-06-02 MED ORDER — HYDROCODONE-ACETAMINOPHEN 5-325 MG PO TABS: 1.0000 | ORAL_TABLET | Freq: Four times a day (QID) | ORAL | 0 refills | Status: DC | PRN

## 2017-06-02 NOTE — Progress Notes (Signed)
Here for follow up. Doing well he stated  

## 2017-06-07 ENCOUNTER — Ambulatory Visit
Admission: RE | Admit: 2017-06-07 | Discharge: 2017-06-07 | Disposition: A | Discharge status: Home / Self Care | Payer: Medicare Other | Source: Ambulatory Visit | Attending: Radiation Oncology | Admitting: Radiation Oncology

## 2017-06-07 ENCOUNTER — Encounter: Payer: Self-pay | Admitting: Radiation Oncology

## 2017-06-07 ENCOUNTER — Other Ambulatory Visit: Payer: Self-pay

## 2017-06-07 VITALS — BP 126/73 | HR 66 | Temp 95.6°F | Resp 18 | Ht 69.0 in | Wt 190.0 lb

## 2017-06-07 DIAGNOSIS — K219 Gastro-esophageal reflux disease without esophagitis: Secondary | ICD-10-CM | POA: Diagnosis not present

## 2017-06-07 DIAGNOSIS — Z510 Encounter for antineoplastic radiation therapy: Secondary | ICD-10-CM | POA: Insufficient documentation

## 2017-06-07 DIAGNOSIS — C7951 Secondary malignant neoplasm of bone: Secondary | ICD-10-CM | POA: Insufficient documentation

## 2017-06-07 DIAGNOSIS — M129 Arthropathy, unspecified: Secondary | ICD-10-CM | POA: Diagnosis not present

## 2017-06-07 DIAGNOSIS — I2510 Atherosclerotic heart disease of native coronary artery without angina pectoris: Secondary | ICD-10-CM | POA: Insufficient documentation

## 2017-06-07 DIAGNOSIS — R3915 Urgency of urination: Secondary | ICD-10-CM | POA: Insufficient documentation

## 2017-06-07 DIAGNOSIS — Z87891 Personal history of nicotine dependence: Secondary | ICD-10-CM | POA: Diagnosis not present

## 2017-06-07 DIAGNOSIS — R351 Nocturia: Secondary | ICD-10-CM | POA: Insufficient documentation

## 2017-06-07 DIAGNOSIS — M545 Low back pain: Secondary | ICD-10-CM | POA: Insufficient documentation

## 2017-06-07 DIAGNOSIS — I10 Essential (primary) hypertension: Secondary | ICD-10-CM | POA: Insufficient documentation

## 2017-06-07 DIAGNOSIS — Z79899 Other long term (current) drug therapy: Secondary | ICD-10-CM | POA: Diagnosis not present

## 2017-06-07 DIAGNOSIS — C61 Malignant neoplasm of prostate: Secondary | ICD-10-CM | POA: Insufficient documentation

## 2017-06-07 DIAGNOSIS — R350 Frequency of micturition: Secondary | ICD-10-CM | POA: Diagnosis not present

## 2017-06-07 DIAGNOSIS — Z7982 Long term (current) use of aspirin: Secondary | ICD-10-CM | POA: Diagnosis not present

## 2017-06-07 NOTE — Consult Note (Signed)
NEW PATIENT EVALUATION  Name: Matthew Hunter  MRN: [id number]  Date:   06/07/2017     DOB: 1929-09-27   This 84 y.o. male patient presents to the clinic for initial evaluation of metastatic disease to his L-spine and SI joints from stage IV prostate cancer.  REFERRING PHYSICIAN: Maryland Pink, MD  CHIEF COMPLAINT:  Chief Complaint  Patient presents with  . Cancer    Pt is here for initial consultation of bone mets    DIAGNOSIS: The encounter diagnosis was Prostate cancer ([place]).   PREVIOUS INVESTIGATIONS:  Bone scan and CT scans reviewed Clinical notes reviewed Pathology report reviewed  HPI: Patient is a 84 year old male who was initially diagnosed with stage IV prostate cancer with extreme elevation of his PSA up to approximately 90 with bone scan evidence of widespread metastatic disease. He had biopsy of his prostate back in October 2018 showing Gleason 9 (4+5) adenocarcinoma. He has been started on Xtandi and is tolerating that well. He continues to pain complain of low back pain. He is able to ambulate well no motor or sensory levels in his lower extremities. Bone scan shows increased uptake in L5 and SI joints. I've asked to evaluate the patient for possible palliative radiation therapy to that area.   PLANNED TREATMENT REGIMEN: Palliative radiation therapy to L5 SI joints  PAST MEDICAL HISTORY:  has a past medical history of Arthritis, Benign enlargement of prostate, CAD (coronary artery disease), Cancer ([place]), Constipation, Elevated PSA, GERD (gastroesophageal reflux disease), Hypertension, Incomplete bladder emptying, Nocturia, Prostate cancer ([place]) (05/03/2017), Urgency of micturation, and Urinary frequency.    PAST SURGICAL HISTORY:  Past Surgical History:  Procedure Laterality Date  . GALLBLADDER SURGERY    . PROSTATE SURGERY    . STOMACH SURGERY      FAMILY HISTORY: family history includes Hypertension in his mother; Stroke in his father.  SOCIAL HISTORY:   reports that he quit smoking about 19 years ago. he has never used smokeless tobacco. He reports that he uses drugs. Frequency: 2.00 times per week. He reports that he does not drink alcohol.  ALLERGIES: Patient has no known allergies.  MEDICATIONS:  Current Outpatient Medications  Medication Sig Dispense Refill  . aspirin EC 81 MG tablet Take 81 mg by mouth daily.     Marland Kitchen atenolol (TENORMIN) 100 MG tablet Take 100 mg by mouth daily.     Marland Kitchen CALCIUM-VITAMIN D PO Take 1 tablet by mouth 2 (two) times daily.     Marland Kitchen dutasteride (AVODART) 0.5 MG capsule Take 1 capsule (0.5 mg total) by mouth daily. 30 capsule 0  . enzalutamide (XTANDI) 40 MG capsule Take 4 capsules (160 mg total) by mouth daily. 120 capsule 0  . finasteride (PROSCAR) 5 MG tablet Take 1 tablet (5 mg total) by mouth daily. 90 tablet 3  . HYDROcodone-acetaminophen (NORCO) 5-325 MG tablet Take 1 tablet by mouth every 6 (six) hours as needed for severe pain. 60 tablet 0  . mirabegron ER (MYRBETRIQ) 25 MG TB24 tablet Take 1 tablet (25 mg total) by mouth daily. 90 tablet 3  . nortriptyline (PAMELOR) 25 MG capsule Take 25 mg by mouth at bedtime. Reported on 05/16/2015  1  . nystatin (MYCOSTATIN) 100000 UNIT/ML suspension SWISH WITH 1 TEASPOONFUL BY MOUTH AND THEN SWALLOW. USE 4 TIMES A...  (REFER TO PRESCRIPTION NOTES).  0  . omeprazole (PRILOSEC) 20 MG capsule Take 20 mg by mouth daily.    Marland Kitchen pyridOXINE (VITAMIN B-6) 50 MG tablet Take 50 mg  by mouth daily.     . tamsulosin (FLOMAX) 0.4 MG CAPS capsule Take 1 capsule (0.4 mg total) by mouth daily. 90 capsule 3  . Telmisartan-Amlodipine 80-5 MG TABS Take 1 tablet by mouth daily.     . traMADol (ULTRAM) 50 MG tablet take 1 tablet by mouth once daily if needed  0   No current facility-administered medications for this encounter.     ECOG PERFORMANCE STATUS:  1 - Symptomatic but completely ambulatory  REVIEW OF SYSTEMS:  Patient denies any weight loss, fatigue, weakness, fever, chills or night  sweats. Patient denies any loss of vision, blurred vision. Patient denies any ringing  of the ears or hearing loss. No irregular heartbeat. Patient denies heart murmur or history of fainting. Patient denies any chest pain or pain radiating to her upper extremities. Patient denies any shortness of breath, difficulty breathing at night, cough or hemoptysis. Patient denies any swelling in the lower legs. Patient denies any nausea vomiting, vomiting of blood, or coffee ground material in the vomitus. Patient denies any stomach pain. Patient states has had normal bowel movements no significant constipation or diarrhea. Patient denies any dysuria, hematuria or significant nocturia. Patient denies any problems walking, swelling in the joints or loss of balance. Patient denies any skin changes, loss of hair or loss of weight. Patient denies any excessive worrying or anxiety or significant depression. Patient denies any problems with insomnia. Patient denies excessive thirst, polyuria, polydipsia. Patient denies any swollen glands, patient denies easy bruising or easy bleeding. Patient denies any recent infections, allergies or URI. Patient "s visual fields have not changed significantly in recent time.    PHYSICAL EXAM: BP 126/73   Pulse 66   Temp (!) 95.6 F (35.3 C)   Resp 18   Ht 5\' 9"  (1.753 m)   Wt 190 lb 0.6 oz (86.2 kg)   BMI 28.06 kg/m  Deep palpation of his lumbar spine doesn't elicit pain. Motor sensory and DTR levels are equal and symmetric in the lower extremities bilaterally. Range of motion of his lower extremities does not elicit pain. Well-developed well-nourished patient in NAD. HEENT reveals PERLA, EOMI, discs not visualized.  Oral cavity is clear. No oral mucosal lesions are identified. Neck is clear without evidence of cervical or supraclavicular adenopathy. Lungs are clear to A&P. Cardiac examination is essentially unremarkable with regular rate and rhythm without murmur rub or thrill.  Abdomen is benign with no organomegaly or masses noted. Motor sensory and DTR levels are equal and symmetric in the upper and lower extremities. Cranial nerves II through XII are grossly intact. Proprioception is intact. No peripheral adenopathy or edema is identified. No motor or sensory levels are noted. Crude visual fields are within normal range.  LABORATORY DATA: Pathology reports reviewed    RADIOLOGY RESULTS: Bone scan and CT scan of abdomen and pelvis reviewed   IMPRESSION: Stage IV prostate cancer with painful metastatic disease to L5 SI joint region in 84 year old male  PLAN: At this time I to go ahead with palliative radiation therapy to his lower lumbar spine and SI joints. Would plan on delivering 3000 cGy in 10 fractions. I personally ordered and scheduled CT simulation for later this week. Risks and benefits of treatment including fatigue skin reaction possible diarrhea all were discussed in detail with the patient. He seems to comprehend my treatment plan well.  I would like to take this opportunity to thank you for allowing me to participate in the care of your patient.Marland Kitchen  Noreene Filbert, MD

## 2017-06-09 ENCOUNTER — Ambulatory Visit
Admission: RE | Admit: 2017-06-09 | Discharge: 2017-06-09 | Disposition: A | Discharge status: Home / Self Care | Payer: Medicare Other | Source: Ambulatory Visit | Attending: Radiation Oncology | Admitting: Radiation Oncology

## 2017-06-09 DIAGNOSIS — C7951 Secondary malignant neoplasm of bone: Secondary | ICD-10-CM | POA: Diagnosis not present

## 2017-06-10 ENCOUNTER — Other Ambulatory Visit: Payer: Self-pay | Admitting: *Deleted

## 2017-06-10 DIAGNOSIS — C7952 Secondary malignant neoplasm of bone marrow: Principal | ICD-10-CM

## 2017-06-10 DIAGNOSIS — C7951 Secondary malignant neoplasm of bone: Secondary | ICD-10-CM

## 2017-06-12 DIAGNOSIS — C7951 Secondary malignant neoplasm of bone: Secondary | ICD-10-CM | POA: Diagnosis not present

## 2017-06-16 ENCOUNTER — Other Ambulatory Visit: Payer: Self-pay | Admitting: Oncology

## 2017-06-16 ENCOUNTER — Ambulatory Visit
Admission: RE | Admit: 2017-06-16 | Discharge: 2017-06-16 | Disposition: A | Discharge status: Home / Self Care | Payer: Medicare Other | Source: Ambulatory Visit | Attending: Radiation Oncology | Admitting: Radiation Oncology

## 2017-06-16 DIAGNOSIS — C7951 Secondary malignant neoplasm of bone: Secondary | ICD-10-CM | POA: Diagnosis not present

## 2017-06-17 ENCOUNTER — Ambulatory Visit
Admission: RE | Admit: 2017-06-17 | Discharge: 2017-06-17 | Disposition: A | Discharge status: Home / Self Care | Payer: Medicare Other | Source: Ambulatory Visit | Attending: Radiation Oncology | Admitting: Radiation Oncology

## 2017-06-17 DIAGNOSIS — C7951 Secondary malignant neoplasm of bone: Secondary | ICD-10-CM | POA: Diagnosis not present

## 2017-06-18 ENCOUNTER — Ambulatory Visit
Admission: RE | Admit: 2017-06-18 | Discharge: 2017-06-18 | Disposition: A | Discharge status: Home / Self Care | Payer: Medicare Other | Source: Ambulatory Visit | Attending: Radiation Oncology | Admitting: Radiation Oncology

## 2017-06-18 DIAGNOSIS — C7951 Secondary malignant neoplasm of bone: Secondary | ICD-10-CM | POA: Diagnosis not present

## 2017-06-21 ENCOUNTER — Ambulatory Visit
Admission: RE | Admit: 2017-06-21 | Discharge: 2017-06-21 | Disposition: A | Discharge status: Home / Self Care | Payer: Medicare Other | Source: Ambulatory Visit | Attending: Radiation Oncology | Admitting: Radiation Oncology

## 2017-06-21 ENCOUNTER — Inpatient Hospital Stay: Payer: Medicare Other

## 2017-06-21 VITALS — BP 98/62 | HR 68 | Temp 95.5°F | Resp 20

## 2017-06-21 DIAGNOSIS — C61 Malignant neoplasm of prostate: Secondary | ICD-10-CM | POA: Diagnosis not present

## 2017-06-21 DIAGNOSIS — M899 Disorder of bone, unspecified: Secondary | ICD-10-CM

## 2017-06-21 DIAGNOSIS — C7951 Secondary malignant neoplasm of bone: Secondary | ICD-10-CM | POA: Diagnosis not present

## 2017-06-21 DIAGNOSIS — R972 Elevated prostate specific antigen [PSA]: Secondary | ICD-10-CM

## 2017-06-21 LAB — COMPREHENSIVE METABOLIC PANEL
ALBUMIN: 4 g/dL (ref 3.5–5.0)
ALK PHOS: 140 U/L — AB (ref 38–126)
ALT: 13 U/L — ABNORMAL LOW (ref 17–63)
ANION GAP: 9 (ref 5–15)
AST: 23 U/L (ref 15–41)
BILIRUBIN TOTAL: 1.3 mg/dL — AB (ref 0.3–1.2)
BUN: 16 mg/dL (ref 6–20)
CO2: 23 mmol/L (ref 22–32)
Calcium: 9.7 mg/dL (ref 8.9–10.3)
Chloride: 105 mmol/L (ref 101–111)
Creatinine, Ser: 1.18 mg/dL (ref 0.61–1.24)
GFR calc Af Amer: >60 mL/min (ref >60)
GFR calc non Af Amer: 54 mL/min — ABNORMAL LOW (ref >60)
GLUCOSE: 143 mg/dL — AB (ref 65–99)
POTASSIUM: 4.2 mmol/L (ref 3.5–5.1)
SODIUM: 137 mmol/L (ref 135–145)
TOTAL PROTEIN: 8.1 g/dL (ref 6.5–8.1)

## 2017-06-21 LAB — CBC WITH DIFFERENTIAL/PLATELET
BASOS ABS: 0 10*3/uL (ref 0–0.1)
Basophils Relative: 0 %
EOS PCT: 1 %
Eosinophils Absolute: 0 10*3/uL (ref 0–0.7)
HEMATOCRIT: 39.7 % — AB (ref 40.0–52.0)
Hemoglobin: 13.1 g/dL (ref 13.0–18.0)
LYMPHS PCT: 22 %
Lymphs Abs: 1.7 10*3/uL (ref 1.0–3.6)
MCH: 28.2 pg (ref 26.0–34.0)
MCHC: 33.1 g/dL (ref 32.0–36.0)
MCV: 85.3 fL (ref 80.0–100.0)
MONO ABS: 0.5 10*3/uL (ref 0.2–1.0)
MONOS PCT: 7 %
NEUTROS ABS: 5.2 10*3/uL (ref 1.4–6.5)
Neutrophils Relative %: 70 %
PLATELETS: 247 10*3/uL (ref 150–440)
RBC: 4.65 MIL/uL (ref 4.40–5.90)
RDW: 14.2 % (ref 11.5–14.5)
WBC: 7.5 10*3/uL (ref 3.8–10.6)

## 2017-06-21 MED ORDER — SODIUM CHLORIDE 0.9 % IV SOLN
Freq: Once | INTRAVENOUS | Status: AC
  Administered 2017-06-21: 11:00:00 via INTRAVENOUS
  Filled 2017-06-21: qty 1000

## 2017-06-21 MED ORDER — ZOLEDRONIC ACID 4 MG/5ML IV CONC
3.5000 mg | Freq: Once | INTRAVENOUS | Status: AC
  Administered 2017-06-21: 3.5 mg via INTRAVENOUS
  Filled 2017-06-21: qty 4.38

## 2017-06-22 ENCOUNTER — Ambulatory Visit
Admission: RE | Admit: 2017-06-22 | Discharge: 2017-06-22 | Disposition: A | Discharge status: Home / Self Care | Payer: Medicare Other | Source: Ambulatory Visit | Attending: Radiation Oncology | Admitting: Radiation Oncology

## 2017-06-22 DIAGNOSIS — C7951 Secondary malignant neoplasm of bone: Secondary | ICD-10-CM | POA: Diagnosis not present

## 2017-06-23 ENCOUNTER — Ambulatory Visit
Admission: RE | Admit: 2017-06-23 | Discharge: 2017-06-23 | Disposition: A | Discharge status: Home / Self Care | Payer: Medicare Other | Source: Ambulatory Visit | Attending: Radiation Oncology | Admitting: Radiation Oncology

## 2017-06-23 DIAGNOSIS — C7951 Secondary malignant neoplasm of bone: Secondary | ICD-10-CM | POA: Diagnosis not present

## 2017-06-24 ENCOUNTER — Ambulatory Visit
Admission: RE | Admit: 2017-06-24 | Discharge: 2017-06-24 | Disposition: A | Discharge status: Home / Self Care | Payer: Medicare Other | Source: Ambulatory Visit | Attending: Radiation Oncology | Admitting: Radiation Oncology

## 2017-06-24 ENCOUNTER — Inpatient Hospital Stay: Payer: Medicare Other

## 2017-06-24 DIAGNOSIS — C61 Malignant neoplasm of prostate: Secondary | ICD-10-CM | POA: Diagnosis not present

## 2017-06-24 DIAGNOSIS — C7951 Secondary malignant neoplasm of bone: Secondary | ICD-10-CM | POA: Diagnosis not present

## 2017-06-24 LAB — BASIC METABOLIC PANEL
ANION GAP: 7 (ref 5–15)
BUN: 10 mg/dL (ref 6–20)
CHLORIDE: 107 mmol/L (ref 101–111)
CO2: 24 mmol/L (ref 22–32)
Calcium: 8.6 mg/dL — ABNORMAL LOW (ref 8.9–10.3)
Creatinine, Ser: 0.98 mg/dL (ref 0.61–1.24)
GFR calc non Af Amer: >60 mL/min (ref >60)
Glucose, Bld: 135 mg/dL — ABNORMAL HIGH (ref 65–99)
POTASSIUM: 4 mmol/L (ref 3.5–5.1)
SODIUM: 138 mmol/L (ref 135–145)

## 2017-06-25 ENCOUNTER — Ambulatory Visit
Admission: RE | Admit: 2017-06-25 | Discharge: 2017-06-25 | Disposition: A | Discharge status: Home / Self Care | Payer: Medicare Other | Source: Ambulatory Visit | Attending: Radiation Oncology | Admitting: Radiation Oncology

## 2017-06-25 DIAGNOSIS — C7951 Secondary malignant neoplasm of bone: Secondary | ICD-10-CM | POA: Diagnosis not present

## 2017-06-28 ENCOUNTER — Ambulatory Visit
Admission: RE | Admit: 2017-06-28 | Discharge: 2017-06-28 | Disposition: A | Discharge status: Home / Self Care | Payer: Medicare Other | Source: Ambulatory Visit | Attending: Radiation Oncology | Admitting: Radiation Oncology

## 2017-06-28 DIAGNOSIS — C7951 Secondary malignant neoplasm of bone: Secondary | ICD-10-CM | POA: Diagnosis not present

## 2017-06-29 ENCOUNTER — Ambulatory Visit
Admission: RE | Admit: 2017-06-29 | Discharge: 2017-06-29 | Disposition: A | Discharge status: Home / Self Care | Payer: Medicare Other | Source: Ambulatory Visit | Attending: Radiation Oncology | Admitting: Radiation Oncology

## 2017-06-29 DIAGNOSIS — C7951 Secondary malignant neoplasm of bone: Secondary | ICD-10-CM | POA: Diagnosis not present

## 2017-06-30 ENCOUNTER — Ambulatory Visit
Admission: RE | Admit: 2017-06-30 | Discharge: 2017-06-30 | Disposition: A | Discharge status: Home / Self Care | Payer: Medicare Other | Source: Ambulatory Visit | Attending: Radiation Oncology | Admitting: Radiation Oncology

## 2017-06-30 ENCOUNTER — Other Ambulatory Visit: Payer: Medicare Other

## 2017-06-30 ENCOUNTER — Ambulatory Visit: Payer: Medicare Other | Admitting: Oncology

## 2017-06-30 DIAGNOSIS — C7951 Secondary malignant neoplasm of bone: Secondary | ICD-10-CM | POA: Diagnosis not present

## 2017-07-19 ENCOUNTER — Other Ambulatory Visit: Payer: Self-pay

## 2017-07-19 MED ORDER — TAMSULOSIN HCL 0.4 MG PO CAPS: 0.4000 mg | ORAL_CAPSULE | Freq: Every day | ORAL | 3 refills | Status: DC

## 2017-07-20 NOTE — Progress Notes (Signed)
Hematology/Oncology  Follow up note [facility] [phone] [phone]  Patient Care Team: [place] Pink, MD as PCP - General (Family Medicine)  CHIEF COMPLAINTS/PURPOSE OF CONSULTATION:  Prostate Cancer  HISTORY OF PRESENTING ILLNESS:  Matthew Hunter 84 y.o.  male with PMH listed as below who was referred by [facility] Urology Provider [name] to me for evaluation of clinical prostate cancer and management.  He has a history of elevated PSA and BPH. PSA trends as follows: 11/14/2014 4.3, 11/07/2015 7.1, 05/11/2016 10.4, 06/11/2016 10.6, 07/07/2016 7.8, 01/06/2017 86.8.  He was seen by urology and was diagnosed with clinical prostate cancer given the extreme elevation of PSA and prostate nodule. Biopsy was not pursued. Bone scan showed multiple abnormal foci of increased osseous tracer accumulation compatible with widespread osseus metastatic disease. CT abdomen showed no evidence of metastatic disease, left adrenal nodule is indeterminate by relative washout characteristics but appears grossly stable in size from 2008, favoring adenoma.   Patient was accompanied by his wife today to clinic. He feels well and has not compliant. With further questioning, he mentioned he occationaly has "soreness" of his mid back, but no persistent pain. He also does not eat much, and is taking marijuana two time a day for appetite stimulant.  He has BPH symptoms. He remains active and able to do ADLs.  Quit smoking and ETOH use many years ago.    Current treatment  Xtandi started December 2019.   INTERVAL HISTORY Patient presents to follow up on management of prostate cancer. He was started on Xtandij 6 weeks ago.   He reports feeling fine today and did not have any concerning side effects. He has occaitonal left rib cage pain, no need to taking any pain medication. Appetite is fair.   ROS:  Review of Systems  Constitutional: Negative for appetite change,  fatigue, fever and unexpected weight change.  HENT:   Negative for mouth sores and nosebleeds.   Eyes: Negative for eye problems.  Respiratory: Negative for chest tightness and cough.   Cardiovascular: Negative for chest pain and leg swelling.  Gastrointestinal: Negative for blood in stool.  Endocrine: Negative.   Genitourinary: Positive for nocturia. Negative for difficulty urinating and dysuria.   Musculoskeletal: Negative for back pain.  Skin: Negative for itching.  Neurological: Negative for headaches.  Hematological: Negative for adenopathy.  Psychiatric/Behavioral: Negative for confusion.    MEDICAL HISTORY:  Past Medical History:  Diagnosis Date  . Arthritis   . Benign enlargement of prostate   . CAD (coronary artery disease)   . Cancer ([place])   . Constipation   . Elevated PSA   . GERD (gastroesophageal reflux disease)   . Hypertension   . Incomplete bladder emptying   . Nocturia   . Prostate cancer ([place]) 05/03/2017  . Urgency of micturation   . Urinary frequency     SURGICAL HISTORY: Past Surgical History:  Procedure Laterality Date  . GALLBLADDER SURGERY    . PROSTATE SURGERY    . STOMACH SURGERY      SOCIAL HISTORY: Social History   Socioeconomic History  . Marital status: Married    Spouse name: Not on file  . Number of children: Not on file  . Years of education: Not on file  . Highest education level: Not on file  Social Needs  . Financial resource strain: Not on file  . Food insecurity - worry: Not on file  . Food insecurity - inability: Not on file  . Transportation  needs - medical: Not on file  . Transportation needs - non-medical: Not on file  Occupational History  . Not on file  Tobacco Use  . Smoking status: Former Smoker    Last attempt to quit: 06/01/1998    Years since quitting: 19.1  . Smokeless tobacco: Never Used  Substance and Sexual Activity  . Alcohol use: No    Alcohol/week: 0.0 oz  . Drug use: Yes    Frequency: 2.0 times per  week    Comment: marijuana two time monthly gives him an appetite  . Sexual activity: Not on file  Other Topics Concern  . Not on file  Social History Narrative  . Not on file    FAMILY HISTORY: Family History  Problem Relation Age of Onset  . Hypertension Mother   . Stroke Father   . Kidney disease Neg Hx   . Prostate cancer Neg Hx   . Kidney cancer Neg Hx   . Bladder Cancer Neg Hx     ALLERGIES:  has No Known Allergies.  MEDICATIONS:  Current Outpatient Medications  Medication Sig Dispense Refill  . aspirin EC 81 MG tablet Take 81 mg by mouth daily.     Marland Kitchen atenolol (TENORMIN) 100 MG tablet Take 100 mg by mouth daily.     Marland Kitchen CALCIUM-VITAMIN D PO Take 1 tablet by mouth 2 (two) times daily.     Marland Kitchen dutasteride (AVODART) 0.5 MG capsule Take 1 capsule (0.5 mg total) by mouth daily. 30 capsule 0  . enzalutamide (XTANDI) 40 MG capsule Take 4 capsules (160 mg total) by mouth daily. 120 capsule 0  . finasteride (PROSCAR) 5 MG tablet Take 1 tablet (5 mg total) by mouth daily. 90 tablet 3  . HYDROcodone-acetaminophen (NORCO) 5-325 MG tablet Take 1 tablet by mouth every 6 (six) hours as needed for severe pain. 60 tablet 0  . mirabegron ER (MYRBETRIQ) 25 MG TB24 tablet Take 1 tablet (25 mg total) by mouth daily. 90 tablet 3  . nortriptyline (PAMELOR) 25 MG capsule Take 25 mg by mouth at bedtime. Reported on 05/16/2015  1  . nystatin (MYCOSTATIN) 100000 UNIT/ML suspension SWISH WITH 1 TEASPOONFUL BY MOUTH AND THEN SWALLOW. USE 4 TIMES A...  (REFER TO PRESCRIPTION NOTES).  0  . omeprazole (PRILOSEC) 20 MG capsule Take 20 mg by mouth daily.    Marland Kitchen pyridOXINE (VITAMIN B-6) 50 MG tablet Take 50 mg by mouth daily.     . tamsulosin (FLOMAX) 0.4 MG CAPS capsule Take 1 capsule (0.4 mg total) by mouth daily. 90 capsule 3  . Telmisartan-Amlodipine 80-5 MG TABS Take 1 tablet by mouth daily.     . traMADol (ULTRAM) 50 MG tablet take 1 tablet by mouth once daily if needed  0   No current  facility-administered medications for this visit.       Marland Kitchen  PHYSICAL EXAMINATION: ECOG PERFORMANCE STATUS: 1 - Symptomatic but completely ambulatory Vitals:   07/21/17 1447  BP: 137/73  Pulse: 60  Temp: (!) 97.4 F (36.3 C)   Filed Weights   07/21/17 1447  Weight: 187 lb 4 oz (84.9 kg)   Physical Exam  Constitutional: He is oriented to person, place, and time. He appears well-developed and well-nourished. No distress.  HENT:  Head: Normocephalic and atraumatic.  Eyes: EOM are normal. Pupils are equal, round, and reactive to light.  Neck: Normal range of motion. Neck supple. No thyromegaly present.  Cardiovascular: Normal rate, regular rhythm and normal heart sounds.  Pulmonary/Chest:  Effort normal and breath sounds normal. No respiratory distress. He has no wheezes.  Abdominal: Soft. Bowel sounds are normal. There is no tenderness. There is no guarding.  Musculoskeletal: Normal range of motion. He exhibits no edema.  Lymphadenopathy:    He has no cervical adenopathy.  Neurological: He is alert and oriented to person, place, and time.  Skin: Skin is warm and dry. No erythema.  Psychiatric: He has a normal mood and affect. His behavior is normal.   LABORATORY DATA:  I have reviewed the data as listed Lab Results  Component Value Date   WBC 7.5 06/21/2017   HGB 13.1 06/21/2017   HCT 39.7 (L) 06/21/2017   MCV 85.3 06/21/2017   PLT 247 06/21/2017   Recent Labs    05/03/17 1139 06/02/17 1147 06/21/17 1030 06/24/17 0920  NA 137 139 137 138  K 4.6 4.1 4.2 4.0  CL 103 106 105 107  CO2 27 26 23 24   GLUCOSE 94 94 143* 135*  BUN 11 10 16 10   CREATININE 1.10 1.13 1.18 0.98  CALCIUM 9.7 9.6 9.7 8.6*  GFRNONAA 59* 57* 54* >60  GFRAA >60 >60 >60 >60  PROT 8.1 7.7 8.1  --   ALBUMIN 4.1 4.0 4.0  --   AST 26 26 23   --   ALT 15* 15* 13*  --   ALKPHOS 82 82 140*  --   BILITOT 1.1 1.0 1.3*  --     RADIOGRAPHIC STUDIES: I have personally reviewed the radiological images  as listed and agreed with the findings in the report. 02/03/2017  Bone scan IMPRESSION: Multiple abnormal foci of with of increased osseous tracer accumulation compatible with widespread osseous metastatic disease as above.  02/03/2017 CT abdomen pelvis w contrast.  IMPRESSION: 1. No evidence of metastatic disease. 2. Left adrenal nodule is indeterminate by relative washout characteristics but appears grossly stable in size from 02/28/2007, favoring a benign adenoma. 3. Left renal stone. 4.  Aortic atherosclerosis (ICD10-170.0).   03/10/2017 PART A: Prostate CNB, Left, Base  PART B: Prostate CNB, Left, Mid  PART C: Prostate CNB, Left, Apex  PART D: Prostate CNB, Right, Base  PART E: Prostate CNB, Right, Mid  PART F: Prostate CNB, Right, Apex   Comment Comment   Comment: Clinical history:                     .  PSA 64.78, DRE Normal, Prev Bx Positive   Comment Comment   Diagnosis:  A. PROSTATIC ADENOCARCINOMA. GLEASON'S SCORE 8 (GRADES 4 + 4) NOTED IN 2  OUT OF 2 SUBMITTED PROSTATE CORE SEGMENTS. APPROXIMATELY 71% OF SUBMITTED  TISSUE INVOLVED. PERINEURAL INVASION IS PRESENT. (GRADE GROUP 4)  B. PROSTATIC ADENOCARCINOMA. GLEASON'S SCORE 9 (GRADES 4 + 5) NOTED IN 1  OUT OF 1 SUBMITTED PROSTATE CORE SEGMENTS. APPROXIMATELY 55% OF SUBMITTED  TISSUE INVOLVED. (GRADE GROUP 5)  C. PROSTATIC ADENOCARCINOMA. GLEASON'S SCORE 8 (GRADES 4 + 4) NOTED IN 2  OUT OF 2 SUBMITTED PROSTATE CORE SEGMENTS. APPROXIMATELY 68% OF SUBMITTED  TISSUE INVOLVED. PERINEURAL INVASION IS PRESENT. (GRADE GROUP 4)  D. BENIGN PROSTATE TISSUE. NO EVIDENCE OF MALIGNANCY.  E. PROSTATIC ADENOCARCINOMA. GLEASON'S SCORE 9 (GRADES 4 + 5) NOTED IN 1  OUT OF 1 SUBMITTED PROSTATE CORE SEGMENTS. APPROXIMATELY 6% OF SUBMITTED  TISSUE INVOLVED. (GRADE GROUP 5)  F. BENIGN PROSTATE TISSUE. NO EVIDENCE OF MALIGNANCY.   . Comment:   Comment: A. Grade groups range from 1 (most favorable) to  5 (least  favorable).  Pierorazio et al. BJU Int 111: 332-95, 2013. Epstein et al.EUR UROL 69:  428-35, 2016.  B. Grade groups range from 1 (most favorable) to 5 (least favorable).  Pierorazio et al. BJU Int 111: 188-41, 2013. Epstein et al.EUR UROL 69:  428-35, 2016.  C. Grade groups range from 1 (most favorable) to 5 (least favorable).  Pierorazio et al. BJU Int 111: 660-63, 2013. Epstein et al.EUR UROL 69:  428-35, 2016.  E. Grade groups range from 1 (most favorable) to 5 (least favorable).  Pierorazio et al. BJU Int 111: 016-01, 2013. Epstein et al.EUR UROL 69:  428-35, 2016.      ASSESSMENT & PLAN: 84 yo male follows up for management of metastatic prostate cancer .  1. Prostate cancer ([place])   2. Prostate cancer metastatic to bone ([place])   3. Goals of care, counseling/discussion    # He appears tolerating Xtandi 160mg  daily, continue. check cbc, cmp, PSA today.  PSA has decreased from 242 to 17.74, PSA pending today Continue ADT Q6 months with urology.  # Zometa today.  # Hypertension: normal reading today.   All questions were answered. The patient knows to call the clinic with any problems questions or concerns.  Return of visit: 4 weeks with repeat labs and zometa.   Earlie Server, MD, PhD Hematology Oncology [facility] Pager- [phone] 07/21/2017

## 2017-07-21 ENCOUNTER — Other Ambulatory Visit: Payer: Self-pay

## 2017-07-21 ENCOUNTER — Inpatient Hospital Stay: Payer: Medicare Other | Attending: Oncology

## 2017-07-21 ENCOUNTER — Inpatient Hospital Stay: Payer: Medicare Other

## 2017-07-21 ENCOUNTER — Inpatient Hospital Stay (HOSPITAL_BASED_OUTPATIENT_CLINIC_OR_DEPARTMENT_OTHER): Payer: Medicare Other | Admitting: Oncology

## 2017-07-21 ENCOUNTER — Encounter: Payer: Self-pay | Admitting: Oncology

## 2017-07-21 VITALS — BP 140/77 | HR 59 | Temp 97.5°F | Resp 18

## 2017-07-21 VITALS — BP 137/73 | HR 60 | Temp 97.4°F | Wt 187.2 lb

## 2017-07-21 DIAGNOSIS — I700 Atherosclerosis of aorta: Secondary | ICD-10-CM

## 2017-07-21 DIAGNOSIS — Z87891 Personal history of nicotine dependence: Secondary | ICD-10-CM | POA: Diagnosis not present

## 2017-07-21 DIAGNOSIS — M899 Disorder of bone, unspecified: Secondary | ICD-10-CM

## 2017-07-21 DIAGNOSIS — I2510 Atherosclerotic heart disease of native coronary artery without angina pectoris: Secondary | ICD-10-CM | POA: Diagnosis not present

## 2017-07-21 DIAGNOSIS — R972 Elevated prostate specific antigen [PSA]: Secondary | ICD-10-CM

## 2017-07-21 DIAGNOSIS — K219 Gastro-esophageal reflux disease without esophagitis: Secondary | ICD-10-CM | POA: Insufficient documentation

## 2017-07-21 DIAGNOSIS — I10 Essential (primary) hypertension: Secondary | ICD-10-CM

## 2017-07-21 DIAGNOSIS — C7951 Secondary malignant neoplasm of bone: Secondary | ICD-10-CM

## 2017-07-21 DIAGNOSIS — R351 Nocturia: Secondary | ICD-10-CM | POA: Insufficient documentation

## 2017-07-21 DIAGNOSIS — C61 Malignant neoplasm of prostate: Secondary | ICD-10-CM

## 2017-07-21 DIAGNOSIS — Z79899 Other long term (current) drug therapy: Secondary | ICD-10-CM | POA: Insufficient documentation

## 2017-07-21 DIAGNOSIS — R531 Weakness: Secondary | ICD-10-CM | POA: Diagnosis not present

## 2017-07-21 DIAGNOSIS — Z7982 Long term (current) use of aspirin: Secondary | ICD-10-CM

## 2017-07-21 DIAGNOSIS — Z7189 Other specified counseling: Secondary | ICD-10-CM

## 2017-07-21 DIAGNOSIS — N401 Enlarged prostate with lower urinary tract symptoms: Secondary | ICD-10-CM

## 2017-07-21 LAB — COMPREHENSIVE METABOLIC PANEL
ALT: 13 U/L — AB (ref 17–63)
AST: 23 U/L (ref 15–41)
Albumin: 3.9 g/dL (ref 3.5–5.0)
Alkaline Phosphatase: 104 U/L (ref 38–126)
Anion gap: 6 (ref 5–15)
BUN: 12 mg/dL (ref 6–20)
CALCIUM: 9.5 mg/dL (ref 8.9–10.3)
CHLORIDE: 108 mmol/L (ref 101–111)
CO2: 25 mmol/L (ref 22–32)
CREATININE: 1.02 mg/dL (ref 0.61–1.24)
Glucose, Bld: 109 mg/dL — ABNORMAL HIGH (ref 65–99)
Potassium: 4.6 mmol/L (ref 3.5–5.1)
SODIUM: 139 mmol/L (ref 135–145)
Total Bilirubin: 0.9 mg/dL (ref 0.3–1.2)
Total Protein: 7.6 g/dL (ref 6.5–8.1)

## 2017-07-21 LAB — CBC WITH DIFFERENTIAL/PLATELET
BASOS PCT: 1 %
Basophils Absolute: 0 10*3/uL (ref 0–0.1)
EOS ABS: 0.2 10*3/uL (ref 0–0.7)
EOS PCT: 3 %
HCT: 37.3 % — ABNORMAL LOW (ref 40.0–52.0)
HEMOGLOBIN: 12.5 g/dL — AB (ref 13.0–18.0)
LYMPHS ABS: 0.9 10*3/uL — AB (ref 1.0–3.6)
Lymphocytes Relative: 17 %
MCH: 29.2 pg (ref 26.0–34.0)
MCHC: 33.4 g/dL (ref 32.0–36.0)
MCV: 87.4 fL (ref 80.0–100.0)
MONO ABS: 0.6 10*3/uL (ref 0.2–1.0)
MONOS PCT: 11 %
NEUTROS PCT: 68 %
Neutro Abs: 3.7 10*3/uL (ref 1.4–6.5)
PLATELETS: 220 10*3/uL (ref 150–440)
RBC: 4.27 MIL/uL — ABNORMAL LOW (ref 4.40–5.90)
RDW: 15.5 % — AB (ref 11.5–14.5)
WBC: 5.4 10*3/uL (ref 3.8–10.6)

## 2017-07-21 MED ORDER — SODIUM CHLORIDE 0.9 % IV SOLN
Freq: Once | INTRAVENOUS | Status: AC
  Administered 2017-07-21: 16:00:00 via INTRAVENOUS
  Filled 2017-07-21: qty 1000

## 2017-07-21 MED ORDER — ZOLEDRONIC ACID 4 MG/100ML IV SOLN
3.5000 mg | Freq: Once | INTRAVENOUS | Status: AC
  Administered 2017-07-21: 3.5 mg via INTRAVENOUS
  Filled 2017-07-21: qty 100

## 2017-07-21 NOTE — Progress Notes (Signed)
Patient here today for follow up.  Patient c/o pain upper abdominal pain, under rib cage to his back in flank area, patient states that he takes tramadol which works well to control the pain.

## 2017-07-22 LAB — PSA: Prostatic Specific Antigen: 1.82 ng/mL (ref 0.00–4.00)

## 2017-07-29 ENCOUNTER — Encounter: Payer: Self-pay | Admitting: Radiation Oncology

## 2017-07-29 ENCOUNTER — Other Ambulatory Visit: Payer: Self-pay

## 2017-07-29 ENCOUNTER — Ambulatory Visit
Admission: RE | Admit: 2017-07-29 | Discharge: 2017-07-29 | Disposition: A | Discharge status: Home / Self Care | Payer: Medicare Other | Source: Ambulatory Visit | Attending: Radiation Oncology | Admitting: Radiation Oncology

## 2017-07-29 VITALS — BP 131/74 | HR 72 | Temp 96.4°F | Resp 18 | Wt 185.6 lb

## 2017-07-29 DIAGNOSIS — Z923 Personal history of irradiation: Secondary | ICD-10-CM | POA: Diagnosis not present

## 2017-07-29 DIAGNOSIS — C7951 Secondary malignant neoplasm of bone: Secondary | ICD-10-CM | POA: Insufficient documentation

## 2017-07-29 DIAGNOSIS — C61 Malignant neoplasm of prostate: Secondary | ICD-10-CM | POA: Insufficient documentation

## 2017-07-29 NOTE — Progress Notes (Signed)
Radiation Oncology Follow up Note  Name: Matthew Hunter   Date:   07/29/2017 MRN:  [id number] DOB: 11/03/1929    This 84 y.o. male presents to the clinic today for one-month follow-up status post palliative radiation therapy to his L-spine and SI joints for stage IV prostate cancer.  REFERRING PROVIDER: Maryland Pink, MD  HPI: Patient is an 84 year old male now one month out having completed palliative radiation therapy to his lumbar spine and SI joints for metastatic involvement of widespread adenocarcinoma the prostate. He is seen today in routine follow-up and is doing well he's had excellent palliative response to treatment he states his memory is somewhat not as good as it used to be not sure the source of that. He's currently on. Xtandi  his PSA is reduced so he is not hormone refractory this point.  And tolerating treatment well.  COMPLICATIONS OF TREATMENT: none  FOLLOW UP COMPLIANCE: keeps appointments   PHYSICAL EXAM:  BP 131/74   Pulse 72   Temp (!) 96.4 F (35.8 C)   Resp 18   Wt 185 lb 10 oz (84.2 kg)   BMI 27.41 kg/m  Range of motion of his lower extremities does not elicit pain. Motor sensory and DTR levels are equal and symmetric in the lower extremities. Deep palpation of his lumbar spine does not elicit pain. Well-developed well-nourished patient in NAD. HEENT reveals PERLA, EOMI, discs not visualized.  Oral cavity is clear. No oral mucosal lesions are identified. Neck is clear without evidence of cervical or supraclavicular adenopathy. Lungs are clear to A&P. Cardiac examination is essentially unremarkable with regular rate and rhythm without murmur rub or thrill. Abdomen is benign with no organomegaly or masses noted. Motor sensory and DTR levels are equal and symmetric in the upper and lower extremities. Cranial nerves II through XII are grossly intact. Proprioception is intact. No peripheral adenopathy or edema is identified. No motor or sensory levels are noted.  Crude visual fields are within normal range.   RADIOLOGY RESULTS: No current films for review  PLAN: Present time he is doing well under good palliative control. Certainly would consider Xofigo in the future should he become hormone refractory. I've asked to see him back in 6 months for follow-up. He continues close follow-up care with medical oncology.  I would like to take this opportunity to thank you for allowing me to participate in the care of your patient.Noreene Filbert, MD

## 2017-08-17 NOTE — Progress Notes (Signed)
Hematology/Oncology  Follow up note [facility] Telephone:([phone]) [phone] [phone]) [phone]  Patient Care Team: [place] Pink, MD as PCP - General (Family Medicine)  REASON FOR VISIT Follow up for treatment of Prostate Cancer  HISTORY OF PRESENTING ILLNESS:  Matthew Hunter 84 y.o.  male with PMH listed as below who was referred by [facility] Urology Provider [name] to me for evaluation of clinical prostate cancer and management.  He has a history of elevated PSA and BPH. PSA trends as follows: 11/14/2014 4.3, 11/07/2015 7.1, 05/11/2016 10.4, 06/11/2016 10.6, 07/07/2016 7.8, 01/06/2017 86.8.  He was seen by urology and was diagnosed with clinical prostate cancer given the extreme elevation of PSA and prostate nodule. Biopsy was not pursued. He was treated with [name] on 01/29/2017,# patient got prostate biopsy on 03/10/2017. 5 out of 6 cores positive for prostate cancer, with highest gleason score group 9 (4+5), + perineural invasion. Xtandi started December 2019.    started on Lupron Q78months on 04/12/2017, follows up with urology for ADT.   # 02/03/2017 Bone scan showed multiple abnormal foci of increased osseous tracer accumulation compatible with widespread osseus metastatic disease. CT abdomen showed no evidence of metastatic disease, left adrenal nodule is indeterminate by relative washout characteristics but appears grossly stable in size from 2008, favoring adenoma.    Current treatment  Patient got first Firmagon on 01/29/2018, switched to leupron on 04/13/2015,  Q6 months. Patient's PSA was 86.8 at the start of ADT on 01/06/2017, went down to 64.78 on 02/24/2017, however, increased again to 131 on 04/12/2017.Testosterone level at 14.  Upfront Gillermina Phy was added on December 2019 to assist fast disease control (phase II trial [place] B, Lancet Oncol. 2014)  S/p palliative RT to lumber spine and SI joints.   INTERVAL HISTORY Patient presents to follow up on  management of prostate cancer. He was started on Xtandi in December 2019. PSA has responded to treatment well, decreased from 131 to 1.82. He reports memonry loss, mental foggy. Otherwise feeling well.  Appetite is fair.   ROS:  Review of Systems  Constitutional: Negative for appetite change, fatigue, fever and unexpected weight change.  HENT:   Negative for mouth sores and nosebleeds.   Eyes: Negative for eye problems.  Respiratory: Negative for chest tightness and cough.   Cardiovascular: Negative for chest pain and leg swelling.  Gastrointestinal: Negative for blood in stool.  Endocrine: Negative.   Genitourinary: Positive for nocturia. Negative for difficulty urinating and dysuria.   Musculoskeletal: Negative for back pain.  Skin: Negative for itching.  Neurological: Negative for headaches.  Hematological: Negative for adenopathy.  Psychiatric/Behavioral: Negative for confusion.    MEDICAL HISTORY:  Past Medical History:  Diagnosis Date  . Arthritis   . Benign enlargement of prostate   . CAD (coronary artery disease)   . Cancer ([place])   . Constipation   . Elevated PSA   . GERD (gastroesophageal reflux disease)   . Hypertension   . Incomplete bladder emptying   . Nocturia   . Prostate cancer ([place]) 05/03/2017  . Urgency of micturation   . Urinary frequency     SURGICAL HISTORY: Past Surgical History:  Procedure Laterality Date  . GALLBLADDER SURGERY    . PROSTATE SURGERY    . STOMACH SURGERY      SOCIAL HISTORY: Social History   Socioeconomic History  . Marital status: Married    Spouse name: Not on file  . Number of children: Not on file  . Years of  education: Not on file  . Highest education level: Not on file  Social Needs  . Financial resource strain: Not on file  . Food insecurity - worry: Not on file  . Food insecurity - inability: Not on file  . Transportation needs - medical: Not on file  . Transportation needs - non-medical: Not on file    Occupational History  . Not on file  Tobacco Use  . Smoking status: Former Smoker    Last attempt to quit: 06/01/1998    Years since quitting: 19.2  . Smokeless tobacco: Never Used  Substance and Sexual Activity  . Alcohol use: No    Alcohol/week: 0.0 oz  . Drug use: Yes    Frequency: 2.0 times per week    Comment: marijuana two time monthly gives him an appetite  . Sexual activity: Not on file  Other Topics Concern  . Not on file  Social History Narrative  . Not on file    FAMILY HISTORY: Family History  Problem Relation Age of Onset  . Hypertension Mother   . Stroke Father   . Kidney disease Neg Hx   . Prostate cancer Neg Hx   . Kidney cancer Neg Hx   . Bladder Cancer Neg Hx     ALLERGIES:  has No Known Allergies.  MEDICATIONS:  Current Outpatient Medications  Medication Sig Dispense Refill  . aspirin EC 81 MG tablet Take 81 mg by mouth daily.     Marland Kitchen atenolol (TENORMIN) 100 MG tablet Take 100 mg by mouth daily.     Marland Kitchen CALCIUM-VITAMIN D PO Take 1 tablet by mouth 2 (two) times daily.     Marland Kitchen dutasteride (AVODART) 0.5 MG capsule Take 1 capsule (0.5 mg total) by mouth daily. 30 capsule 0  . enzalutamide (XTANDI) 40 MG capsule Take 4 capsules (160 mg total) by mouth daily. 120 capsule 0  . finasteride (PROSCAR) 5 MG tablet Take 1 tablet (5 mg total) by mouth daily. 90 tablet 3  . HYDROcodone-acetaminophen (NORCO) 5-325 MG tablet Take 1 tablet by mouth every 6 (six) hours as needed for severe pain. (Patient not taking: Reported on 07/29/2017) 60 tablet 0  . mirabegron ER (MYRBETRIQ) 25 MG TB24 tablet Take 1 tablet (25 mg total) by mouth daily. 90 tablet 3  . nortriptyline (PAMELOR) 25 MG capsule Take 25 mg by mouth at bedtime. Reported on 05/16/2015  1  . nystatin (MYCOSTATIN) 100000 UNIT/ML suspension SWISH WITH 1 TEASPOONFUL BY MOUTH AND THEN SWALLOW. USE 4 TIMES A...  (REFER TO PRESCRIPTION NOTES).  0  . omeprazole (PRILOSEC) 20 MG capsule Take 20 mg by mouth daily.    Marland Kitchen  pyridOXINE (VITAMIN B-6) 50 MG tablet Take 50 mg by mouth daily.     . tamsulosin (FLOMAX) 0.4 MG CAPS capsule Take 1 capsule (0.4 mg total) by mouth daily. 90 capsule 3  . Telmisartan-Amlodipine 80-5 MG TABS Take 1 tablet by mouth daily.     . traMADol (ULTRAM) 50 MG tablet take 1 tablet by mouth once daily if needed  0   No current facility-administered medications for this visit.       Marland Kitchen  PHYSICAL EXAMINATION: ECOG PERFORMANCE STATUS: 1 - Symptomatic but completely ambulatory Vitals:   08/18/17 1429  BP: 115/73  Pulse: 66  Resp: 18  Temp: (!) 96.8 F (36 C)   Filed Weights   08/18/17 1429  Weight: 186 lb 9 oz (84.6 kg)   Physical Exam  Constitutional: He is oriented to  person, place, and time. No distress.  HENT:  Head: Normocephalic and atraumatic.  Mouth/Throat: No oropharyngeal exudate.  Eyes: Pupils are equal, round, and reactive to light. No scleral icterus.  Neck: Normal range of motion. Neck supple. No thyromegaly present.  Cardiovascular: Normal rate, regular rhythm and normal heart sounds.  Pulmonary/Chest: Effort normal and breath sounds normal. No respiratory distress. He has no wheezes.  Abdominal: Soft. Bowel sounds are normal. There is no tenderness. There is no guarding.  Musculoskeletal: Normal range of motion. He exhibits no edema or deformity.  Lymphadenopathy:    He has no cervical adenopathy.  Neurological: He is alert and oriented to person, place, and time. No cranial nerve deficit.  Skin: Skin is warm and dry. He is not diaphoretic. No erythema.  Psychiatric: He has a normal mood and affect. His behavior is normal.   LABORATORY DATA:  I have reviewed the data as listed Lab Results  Component Value Date   WBC 5.4 07/21/2017   HGB 12.5 (L) 07/21/2017   HCT 37.3 (L) 07/21/2017   MCV 87.4 07/21/2017   PLT 220 07/21/2017   Recent Labs    06/02/17 1147 06/21/17 1030 06/24/17 0920 07/21/17 1422  NA 139 137 138 139  K 4.1 4.2 4.0 4.6  CL  106 105 107 108  CO2 26 23 24 25   GLUCOSE 94 143* 135* 109*  BUN 10 16 10 12   CREATININE 1.13 1.18 0.98 1.02  CALCIUM 9.6 9.7 8.6* 9.5  GFRNONAA 57* 54* >60 >60  GFRAA >60 >60 >60 >60  PROT 7.7 8.1  --  7.6  ALBUMIN 4.0 4.0  --  3.9  AST 26 23  --  23  ALT 15* 13*  --  13*  ALKPHOS 82 140*  --  104  BILITOT 1.0 1.3*  --  0.9    RADIOGRAPHIC STUDIES: I have personally reviewed the radiological images as listed and agreed with the findings in the report. 02/03/2017  Bone scan IMPRESSION: Multiple abnormal foci of with of increased osseous tracer accumulation compatible with widespread osseous metastatic disease as above.  02/03/2017 CT abdomen pelvis w contrast.  IMPRESSION: 1. No evidence of metastatic disease. 2. Left adrenal nodule is indeterminate by relative washout characteristics but appears grossly stable in size from 02/28/2007, favoring a benign adenoma. 3. Left renal stone. 4.  Aortic atherosclerosis (ICD10-170.0).   03/10/2017 PART A: Prostate CNB, Left, Base  PART B: Prostate CNB, Left, Mid  PART C: Prostate CNB, Left, Apex  PART D: Prostate CNB, Right, Base  PART E: Prostate CNB, Right, Mid  PART F: Prostate CNB, Right, Apex   Comment Comment   Comment: Clinical history:                     .  PSA 64.78, DRE Normal, Prev Bx Positive   Comment Comment   Diagnosis:  A. PROSTATIC ADENOCARCINOMA. GLEASON'S SCORE 8 (GRADES 4 + 4) NOTED IN 2  OUT OF 2 SUBMITTED PROSTATE CORE SEGMENTS. APPROXIMATELY 71% OF SUBMITTED  TISSUE INVOLVED. PERINEURAL INVASION IS PRESENT. (GRADE GROUP 4)  B. PROSTATIC ADENOCARCINOMA. GLEASON'S SCORE 9 (GRADES 4 + 5) NOTED IN 1  OUT OF 1 SUBMITTED PROSTATE CORE SEGMENTS. APPROXIMATELY 55% OF SUBMITTED  TISSUE INVOLVED. (GRADE GROUP 5)  C. PROSTATIC ADENOCARCINOMA. GLEASON'S SCORE 8 (GRADES 4 + 4) NOTED IN 2  OUT OF 2 SUBMITTED PROSTATE CORE SEGMENTS. APPROXIMATELY 68% OF SUBMITTED  TISSUE INVOLVED. PERINEURAL  INVASION IS PRESENT. (GRADE GROUP 4)  D. BENIGN PROSTATE TISSUE. NO EVIDENCE OF MALIGNANCY.  E. PROSTATIC ADENOCARCINOMA. GLEASON'S SCORE 9 (GRADES 4 + 5) NOTED IN 1  OUT OF 1 SUBMITTED PROSTATE CORE SEGMENTS. APPROXIMATELY 6% OF SUBMITTED  TISSUE INVOLVED. (GRADE GROUP 5)  F. BENIGN PROSTATE TISSUE. NO EVIDENCE OF MALIGNANCY.   . Comment:   Comment: A. Grade groups range from 1 (most favorable) to 5 (least favorable).  Pierorazio et al. BJU Int 111: 097-35, 2013. Epstein et al.EUR UROL 69:  428-35, 2016.  B. Grade groups range from 1 (most favorable) to 5 (least favorable).  Pierorazio et al. BJU Int 111: 329-92, 2013. Epstein et al.EUR UROL 69:  428-35, 2016.  C. Grade groups range from 1 (most favorable) to 5 (least favorable).  Pierorazio et al. BJU Int 111: 426-83, 2013. Epstein et al.EUR UROL 69:  428-35, 2016.  E. Grade groups range from 1 (most favorable) to 5 (least favorable).  Pierorazio et al. BJU Int 111: 419-62, 2013. Epstein et al.EUR UROL 69:  428-35, 2016.      ASSESSMENT & PLAN: 84 yo male follows up for management of metastatic prostate cancer .  1. Prostate cancer ([place])   2. Prostate cancer metastatic to bone [facility])    # Stop Xtandi for possible CNS side effects. Today's PSA pending.   Continue ADT Q6 months with urology. Overall he is castration sensitive.  Follow up in 4 weeks to see if any changes with his memory. Will also check B12, Folate TSH., # Hold zometa given that he has metastatic castration sensitive disease.  All questions were answered. The patient knows to call the clinic with any problems questions or concerns.  Return of visit: 4 weeks with repeat labs  Earlie Server, MD, PhD Hematology Oncology [facility] at [facility] Pager- [phone] 08/18/2017

## 2017-08-18 ENCOUNTER — Encounter: Payer: Self-pay | Admitting: Oncology

## 2017-08-18 ENCOUNTER — Inpatient Hospital Stay (HOSPITAL_BASED_OUTPATIENT_CLINIC_OR_DEPARTMENT_OTHER): Payer: Medicare Other | Admitting: Oncology

## 2017-08-18 ENCOUNTER — Inpatient Hospital Stay: Payer: Medicare Other

## 2017-08-18 ENCOUNTER — Inpatient Hospital Stay: Payer: Medicare Other | Attending: Oncology

## 2017-08-18 VITALS — BP 115/73 | HR 66 | Temp 96.8°F | Resp 18 | Wt 186.6 lb

## 2017-08-18 DIAGNOSIS — C61 Malignant neoplasm of prostate: Secondary | ICD-10-CM

## 2017-08-18 DIAGNOSIS — M899 Disorder of bone, unspecified: Secondary | ICD-10-CM

## 2017-08-18 DIAGNOSIS — I10 Essential (primary) hypertension: Secondary | ICD-10-CM

## 2017-08-18 DIAGNOSIS — Z923 Personal history of irradiation: Secondary | ICD-10-CM | POA: Insufficient documentation

## 2017-08-18 DIAGNOSIS — Z87891 Personal history of nicotine dependence: Secondary | ICD-10-CM | POA: Insufficient documentation

## 2017-08-18 DIAGNOSIS — K219 Gastro-esophageal reflux disease without esophagitis: Secondary | ICD-10-CM | POA: Diagnosis not present

## 2017-08-18 DIAGNOSIS — Z7982 Long term (current) use of aspirin: Secondary | ICD-10-CM | POA: Insufficient documentation

## 2017-08-18 DIAGNOSIS — Z79818 Long term (current) use of other agents affecting estrogen receptors and estrogen levels: Secondary | ICD-10-CM | POA: Diagnosis not present

## 2017-08-18 DIAGNOSIS — N400 Enlarged prostate without lower urinary tract symptoms: Secondary | ICD-10-CM | POA: Diagnosis not present

## 2017-08-18 DIAGNOSIS — I2510 Atherosclerotic heart disease of native coronary artery without angina pectoris: Secondary | ICD-10-CM

## 2017-08-18 DIAGNOSIS — R413 Other amnesia: Secondary | ICD-10-CM | POA: Insufficient documentation

## 2017-08-18 DIAGNOSIS — C7951 Secondary malignant neoplasm of bone: Secondary | ICD-10-CM

## 2017-08-18 DIAGNOSIS — Z79899 Other long term (current) drug therapy: Secondary | ICD-10-CM | POA: Diagnosis not present

## 2017-08-18 DIAGNOSIS — R972 Elevated prostate specific antigen [PSA]: Secondary | ICD-10-CM

## 2017-08-18 DIAGNOSIS — M1990 Unspecified osteoarthritis, unspecified site: Secondary | ICD-10-CM | POA: Insufficient documentation

## 2017-08-18 LAB — CBC WITH DIFFERENTIAL/PLATELET
BASOS ABS: 0 10*3/uL (ref 0–0.1)
Basophils Relative: 0 %
EOS PCT: 2 %
Eosinophils Absolute: 0.1 10*3/uL (ref 0–0.7)
HCT: 35.9 % — ABNORMAL LOW (ref 40.0–52.0)
HEMOGLOBIN: 12.2 g/dL — AB (ref 13.0–18.0)
LYMPHS ABS: 1 10*3/uL (ref 1.0–3.6)
LYMPHS PCT: 12 %
MCH: 29.6 pg (ref 26.0–34.0)
MCHC: 33.9 g/dL (ref 32.0–36.0)
MCV: 87.3 fL (ref 80.0–100.0)
Monocytes Absolute: 0.7 10*3/uL (ref 0.2–1.0)
Monocytes Relative: 9 %
NEUTROS ABS: 6.5 10*3/uL (ref 1.4–6.5)
Neutrophils Relative %: 77 %
PLATELETS: 204 10*3/uL (ref 150–440)
RBC: 4.11 MIL/uL — ABNORMAL LOW (ref 4.40–5.90)
RDW: 15.8 % — ABNORMAL HIGH (ref 11.5–14.5)
WBC: 8.3 10*3/uL (ref 3.8–10.6)

## 2017-08-18 LAB — COMPREHENSIVE METABOLIC PANEL
ALK PHOS: 79 U/L (ref 38–126)
ALT: 13 U/L — ABNORMAL LOW (ref 17–63)
AST: 26 U/L (ref 15–41)
Albumin: 4.1 g/dL (ref 3.5–5.0)
Anion gap: 12 (ref 5–15)
BUN: 11 mg/dL (ref 6–20)
CALCIUM: 9.4 mg/dL (ref 8.9–10.3)
CHLORIDE: 109 mmol/L (ref 101–111)
CO2: 19 mmol/L — AB (ref 22–32)
CREATININE: 1 mg/dL (ref 0.61–1.24)
GFR calc Af Amer: >60 mL/min (ref >60)
Glucose, Bld: 139 mg/dL — ABNORMAL HIGH (ref 65–99)
Potassium: 4.3 mmol/L (ref 3.5–5.1)
SODIUM: 140 mmol/L (ref 135–145)
Total Bilirubin: 1.1 mg/dL (ref 0.3–1.2)
Total Protein: 7.8 g/dL (ref 6.5–8.1)

## 2017-08-18 LAB — PSA: Prostatic Specific Antigen: 1.17 ng/mL (ref 0.00–4.00)

## 2017-08-18 NOTE — Progress Notes (Signed)
Pt in for follow up, denies any difficulties or concerns.  

## 2017-08-20 ENCOUNTER — Telehealth: Payer: Self-pay | Admitting: Oncology

## 2017-08-20 NOTE — Telephone Encounter (Addendum)
Oral Oncology Patient Advocate Encounter   Called to cancel patients Xtandi. Patient is no longer taking. Spoke with Elonda Husky. He will stop shipment.   [place] Patient Advocate [phone] 08/20/2017 1:02 PM

## 2017-09-13 NOTE — Progress Notes (Signed)
Hematology/Oncology  Follow up note [facility] [phone] [phone]  Patient Care Team: [place] Pink, MD as PCP - General (Family Medicine)  REASON FOR VISIT Follow up for treatment of Prostate Cancer  HISTORY OF PRESENTING ILLNESS:  Matthew Hunter 84 y.o.  male with PMH listed as below who was referred by [facility] Urology Provider [name] to me for evaluation of clinical prostate cancer and management.  He has a history of elevated PSA and BPH. PSA trends as follows: 11/14/2014 4.3, 11/07/2015 7.1, 05/11/2016 10.4, 06/11/2016 10.6, 07/07/2016 7.8, 01/06/2017 86.8.  He was seen by urology and was diagnosed with clinical prostate cancer given the extreme elevation of PSA and prostate nodule. Biopsy was not pursued. He was treated with [name] on 01/29/2017,# patient got prostate biopsy on 03/10/2017. 5 out of 6 cores positive for prostate cancer, with highest gleason score group 9 (4+5), + perineural invasion. Xtandi started December 2019.    started on Lupron Q45months on 04/12/2017, follows up with urology for ADT.   # 02/03/2017 Bone scan showed multiple abnormal foci of increased osseous tracer accumulation compatible with widespread osseus metastatic disease. CT abdomen showed no evidence of metastatic disease, left adrenal nodule is indeterminate by relative washout characteristics but appears grossly stable in size from 2008, favoring adenoma.    INTERVAL HISTORY Patient presents to follow up on management of prostate cancer. Reports that his memory loss has improved after Gillermina Phy is discontinued.  Reports feeling well. Occasionally he has right shoulder blade pain, but no persistent pain.   PSA 86.8 -->64.78 -->131--> 242 added Xtanti --> 17.74-->1.82 Xtandi stopped--> 1.79 Current treatment  Patient got first [place] on 01/29/2018, switched to leupron on 04/13/2015,  Q6 months.  .Testosterone level at 14, castration level.  Upfront  Gillermina Phy was added on December 2019 to assist fast disease control (phase II trial [place] B, [facility]et Oncol. 2014) S/p palliative RT to lumber spine and SI joints.     ROS:  Review of Systems  Constitutional: Negative for appetite change, chills, diaphoresis, fatigue, fever and unexpected weight change.  HENT:   Negative for mouth sores, nosebleeds and sore throat.   Eyes: Negative for eye problems.  Respiratory: Negative for chest tightness, cough and shortness of breath.   Cardiovascular: Negative for chest pain and leg swelling.  Gastrointestinal: Negative for abdominal distention, abdominal pain and blood in stool.  Endocrine: Negative for hot flashes.  Genitourinary: Positive for nocturia. Negative for difficulty urinating, dysuria, frequency and hematuria.   Musculoskeletal: Negative for back pain, flank pain, gait problem and myalgias.  Skin: Negative for itching and rash.  Neurological: Negative for dizziness, gait problem, headaches, numbness and seizures.  Hematological: Negative for adenopathy.  Psychiatric/Behavioral: Negative for confusion, decreased concentration and depression.    MEDICAL HISTORY:  Past Medical History:  Diagnosis Date  . Arthritis   . Benign enlargement of prostate   . CAD (coronary artery disease)   . Cancer ([place])   . Constipation   . Elevated PSA   . GERD (gastroesophageal reflux disease)   . Hypertension   . Incomplete bladder emptying   . Nocturia   . Prostate cancer ([place]) 05/03/2017  . Urgency of micturation   . Urinary frequency     SURGICAL HISTORY: Past Surgical History:  Procedure Laterality Date  . GALLBLADDER SURGERY    . PROSTATE SURGERY    . STOMACH SURGERY      SOCIAL HISTORY: Social History   Socioeconomic History  . Marital status:  Married    Spouse name: Not on file  . Number of children: Not on file  . Years of education: Not on file  . Highest education level: Not on file  Occupational History  . Not on file    Social Needs  . Financial resource strain: Not on file  . Food insecurity:    Worry: Not on file    Inability: Not on file  . Transportation needs:    Medical: Not on file    Non-medical: Not on file  Tobacco Use  . Smoking status: Former Smoker    Last attempt to quit: 06/01/1998    Years since quitting: 19.2  . Smokeless tobacco: Never Used  Substance and Sexual Activity  . Alcohol use: No    Alcohol/week: 0.0 oz  . Drug use: Yes    Frequency: 2.0 times per week    Comment: marijuana two time monthly gives him an appetite  . Sexual activity: Not on file  Lifestyle  . Physical activity:    Days per week: Not on file    Minutes per session: Not on file  . Stress: Not on file  Relationships  . Social connections:    Talks on phone: Not on file    Gets together: Not on file    Attends religious service: Not on file    Active member of club or organization: Not on file    Attends meetings of clubs or organizations: Not on file    Relationship status: Not on file  . Intimate partner violence:    Fear of current or ex partner: Not on file    Emotionally abused: Not on file    Physically abused: Not on file    Forced sexual activity: Not on file  Other Topics Concern  . Not on file  Social History Narrative  . Not on file    FAMILY HISTORY: Family History  Problem Relation Age of Onset  . Hypertension Mother   . Stroke Father   . Kidney disease Neg Hx   . Prostate cancer Neg Hx   . Kidney cancer Neg Hx   . Bladder Cancer Neg Hx     ALLERGIES:  has No Known Allergies.  MEDICATIONS:  Current Outpatient Medications  Medication Sig Dispense Refill  . aspirin EC 81 MG tablet Take 81 mg by mouth daily.     Marland Kitchen atenolol (TENORMIN) 100 MG tablet Take 100 mg by mouth daily.     Marland Kitchen CALCIUM-VITAMIN D PO Take 1 tablet by mouth 2 (two) times daily.     Marland Kitchen dutasteride (AVODART) 0.5 MG capsule Take 1 capsule (0.5 mg total) by mouth daily. 30 capsule 0  . finasteride (PROSCAR) 5  MG tablet Take 1 tablet (5 mg total) by mouth daily. 90 tablet 3  . HYDROcodone-acetaminophen (NORCO) 5-325 MG tablet Take 1 tablet by mouth every 6 (six) hours as needed for severe pain. (Patient not taking: Reported on 07/29/2017) 60 tablet 0  . mirabegron ER (MYRBETRIQ) 25 MG TB24 tablet Take 1 tablet (25 mg total) by mouth daily. 90 tablet 3  . nortriptyline (PAMELOR) 25 MG capsule Take 25 mg by mouth at bedtime. Reported on 05/16/2015  1  . nystatin (MYCOSTATIN) 100000 UNIT/ML suspension SWISH WITH 1 TEASPOONFUL BY MOUTH AND THEN SWALLOW. USE 4 TIMES A...  (REFER TO PRESCRIPTION NOTES).  0  . omeprazole (PRILOSEC) 20 MG capsule Take 20 mg by mouth daily.    Marland Kitchen pyridOXINE (VITAMIN B-6) 50 MG  tablet Take 50 mg by mouth daily.     . tamsulosin (FLOMAX) 0.4 MG CAPS capsule Take 1 capsule (0.4 mg total) by mouth daily. 90 capsule 3  . Telmisartan-Amlodipine 80-5 MG TABS Take 1 tablet by mouth daily.     . traMADol (ULTRAM) 50 MG tablet take 1 tablet by mouth once daily if needed  0   No current facility-administered medications for this visit.       Marland Kitchen  PHYSICAL EXAMINATION: ECOG PERFORMANCE STATUS: 1 - Symptomatic but completely ambulatory Vitals:   09/15/17 1056  BP: 116/62  Pulse: 64  Resp: 18  Temp: (!) 97.1 F (36.2 C)   Filed Weights   09/15/17 1056  Weight: 187 lb 3 oz (84.9 kg)   Physical Exam  Constitutional: He is oriented to person, place, and time. He appears well-developed and well-nourished. No distress.  HENT:  Head: Normocephalic and atraumatic.  Mouth/Throat: Oropharynx is clear and moist. No oropharyngeal exudate.  Eyes: Pupils are equal, round, and reactive to light. EOM are normal. No scleral icterus.  Neck: Normal range of motion. Neck supple. No JVD present. No thyromegaly present.  Cardiovascular: Normal rate, regular rhythm and normal heart sounds.  Pulmonary/Chest: Effort normal and breath sounds normal. No stridor. No respiratory distress. He has no  wheezes.  Abdominal: Soft. Bowel sounds are normal. He exhibits no distension. There is no tenderness. There is no guarding.  Musculoskeletal: Normal range of motion. He exhibits no edema or deformity.  Lymphadenopathy:    He has no cervical adenopathy.  Neurological: He is alert and oriented to person, place, and time. He displays normal reflexes. No cranial nerve deficit. Coordination normal.  Skin: Skin is warm and dry. He is not diaphoretic. No erythema.  Psychiatric: He has a normal mood and affect. His behavior is normal.   LABORATORY DATA:  I have reviewed the data as listed Lab Results  Component Value Date   WBC 8.3 08/18/2017   HGB 12.2 (L) 08/18/2017   HCT 35.9 (L) 08/18/2017   MCV 87.3 08/18/2017   PLT 204 08/18/2017   Recent Labs    06/21/17 1030 06/24/17 0920 07/21/17 1422 08/18/17 1418  NA 137 138 139 140  K 4.2 4.0 4.6 4.3  CL 105 107 108 109  CO2 23 24 25  19*  GLUCOSE 143* 135* 109* 139*  BUN 16 10 12 11   CREATININE 1.18 0.98 1.02 1.00  CALCIUM 9.7 8.6* 9.5 9.4  GFRNONAA 54* >60 >60 >60  GFRAA >60 >60 >60 >60  PROT 8.1  --  7.6 7.8  ALBUMIN 4.0  --  3.9 4.1  AST 23  --  23 26  ALT 13*  --  13* 13*  ALKPHOS 140*  --  104 79  BILITOT 1.3*  --  0.9 1.1    RADIOGRAPHIC STUDIES: I have personally reviewed the radiological images as listed and agreed with the findings in the report. 02/03/2017  Bone scan IMPRESSION: Multiple abnormal foci of with of increased osseous tracer accumulation compatible with widespread osseous metastatic disease as above.  02/03/2017 CT abdomen pelvis w contrast.  IMPRESSION: 1. No evidence of metastatic disease. 2. Left adrenal nodule is indeterminate by relative washout characteristics but appears grossly stable in size from 02/28/2007, favoring a benign adenoma. 3. Left renal stone. 4.  Aortic atherosclerosis (ICD10-170.0).   03/10/2017 PART A: Prostate CNB, Left, Base  PART B: Prostate CNB, Left, Mid  PART C:  Prostate CNB, Left, Apex  PART D: Prostate  CNB, Right, Base  PART E: Prostate CNB, Right, Mid  PART F: Prostate CNB, Right, Apex   Comment Comment   Comment: Clinical history:                     .  PSA 64.78, DRE Normal, Prev Bx Positive   Comment Comment   Diagnosis:  A. PROSTATIC ADENOCARCINOMA. GLEASON'S SCORE 8 (GRADES 4 + 4) NOTED IN 2  OUT OF 2 SUBMITTED PROSTATE CORE SEGMENTS. APPROXIMATELY 71% OF SUBMITTED  TISSUE INVOLVED. PERINEURAL INVASION IS PRESENT. (GRADE GROUP 4)  B. PROSTATIC ADENOCARCINOMA. GLEASON'S SCORE 9 (GRADES 4 + 5) NOTED IN 1  OUT OF 1 SUBMITTED PROSTATE CORE SEGMENTS. APPROXIMATELY 55% OF SUBMITTED  TISSUE INVOLVED. (GRADE GROUP 5)  C. PROSTATIC ADENOCARCINOMA. GLEASON'S SCORE 8 (GRADES 4 + 4) NOTED IN 2  OUT OF 2 SUBMITTED PROSTATE CORE SEGMENTS. APPROXIMATELY 68% OF SUBMITTED  TISSUE INVOLVED. PERINEURAL INVASION IS PRESENT. (GRADE GROUP 4)  D. BENIGN PROSTATE TISSUE. NO EVIDENCE OF MALIGNANCY.  E. PROSTATIC ADENOCARCINOMA. GLEASON'S SCORE 9 (GRADES 4 + 5) NOTED IN 1  OUT OF 1 SUBMITTED PROSTATE CORE SEGMENTS. APPROXIMATELY 6% OF SUBMITTED  TISSUE INVOLVED. (GRADE GROUP 5)  F. BENIGN PROSTATE TISSUE. NO EVIDENCE OF MALIGNANCY.   . Comment:   Comment: A. Grade groups range from 1 (most favorable) to 5 (least favorable).  Pierorazio et al. BJU Int 111: 357-01, 2013. Epstein et al.EUR UROL 69:  428-35, 2016.  B. Grade groups range from 1 (most favorable) to 5 (least favorable).  Pierorazio et al. BJU Int 111: 779-39, 2013. Epstein et al.EUR UROL 69:  428-35, 2016.  C. Grade groups range from 1 (most favorable) to 5 (least favorable).  Pierorazio et al. BJU Int 111: 030-09, 2013. Epstein et al.EUR UROL 69:  428-35, 2016.  E. Grade groups range from 1 (most favorable) to 5 (least favorable).  Pierorazio et al. BJU Int 111: 233-00, 2013. Epstein et al.EUR UROL 69:  428-35, 2016.      ASSESSMENT & PLAN: 84 yo male follows up  for management of metastatic prostate cancer .  1. Prostate cancer ([place])   2. Prostate cancer metastatic to bone [facility])    # PSA continue to drop, PSA is 1.79   Continue ADT Q6 months with urology. Overall he is castration sensitive.  # memory loss improved after stopped Xtandi.  nomal B12, Folate TSH., # Hold zometa given that he has metastatic castration sensitive disease.  All questions were answered. The patient knows to call the clinic with any problems questions or concerns.  Return of visit: 4 weeks with repeat labs  Earlie Server, MD, PhD Hematology Oncology [facility] Pager- [phone] 09/13/2017

## 2017-09-15 ENCOUNTER — Inpatient Hospital Stay: Payer: Medicare Other | Attending: Oncology | Admitting: Oncology

## 2017-09-15 ENCOUNTER — Inpatient Hospital Stay: Payer: Medicare Other

## 2017-09-15 ENCOUNTER — Encounter: Payer: Self-pay | Admitting: Oncology

## 2017-09-15 VITALS — BP 116/62 | HR 64 | Temp 97.1°F | Resp 18 | Wt 187.2 lb

## 2017-09-15 DIAGNOSIS — Z7982 Long term (current) use of aspirin: Secondary | ICD-10-CM

## 2017-09-15 DIAGNOSIS — C7951 Secondary malignant neoplasm of bone: Secondary | ICD-10-CM

## 2017-09-15 DIAGNOSIS — M25511 Pain in right shoulder: Secondary | ICD-10-CM | POA: Diagnosis not present

## 2017-09-15 DIAGNOSIS — K219 Gastro-esophageal reflux disease without esophagitis: Secondary | ICD-10-CM | POA: Diagnosis not present

## 2017-09-15 DIAGNOSIS — I10 Essential (primary) hypertension: Secondary | ICD-10-CM

## 2017-09-15 DIAGNOSIS — R413 Other amnesia: Secondary | ICD-10-CM

## 2017-09-15 DIAGNOSIS — Z79818 Long term (current) use of other agents affecting estrogen receptors and estrogen levels: Secondary | ICD-10-CM | POA: Diagnosis not present

## 2017-09-15 DIAGNOSIS — R972 Elevated prostate specific antigen [PSA]: Secondary | ICD-10-CM

## 2017-09-15 DIAGNOSIS — Z79899 Other long term (current) drug therapy: Secondary | ICD-10-CM | POA: Diagnosis not present

## 2017-09-15 DIAGNOSIS — C61 Malignant neoplasm of prostate: Secondary | ICD-10-CM

## 2017-09-15 DIAGNOSIS — R339 Retention of urine, unspecified: Secondary | ICD-10-CM

## 2017-09-15 DIAGNOSIS — N400 Enlarged prostate without lower urinary tract symptoms: Secondary | ICD-10-CM

## 2017-09-15 DIAGNOSIS — M899 Disorder of bone, unspecified: Secondary | ICD-10-CM

## 2017-09-15 DIAGNOSIS — N401 Enlarged prostate with lower urinary tract symptoms: Secondary | ICD-10-CM | POA: Diagnosis not present

## 2017-09-15 DIAGNOSIS — Z87891 Personal history of nicotine dependence: Secondary | ICD-10-CM | POA: Diagnosis not present

## 2017-09-15 DIAGNOSIS — R351 Nocturia: Secondary | ICD-10-CM | POA: Diagnosis not present

## 2017-09-15 DIAGNOSIS — I2510 Atherosclerotic heart disease of native coronary artery without angina pectoris: Secondary | ICD-10-CM | POA: Diagnosis not present

## 2017-09-15 LAB — CBC WITH DIFFERENTIAL/PLATELET
BASOS ABS: 0 10*3/uL (ref 0–0.1)
BASOS PCT: 0 %
Eosinophils Absolute: 0.1 10*3/uL (ref 0–0.7)
Eosinophils Relative: 3 %
HEMATOCRIT: 37.7 % — AB (ref 40.0–52.0)
HEMOGLOBIN: 12.7 g/dL — AB (ref 13.0–18.0)
LYMPHS PCT: 22 %
Lymphs Abs: 1.1 10*3/uL (ref 1.0–3.6)
MCH: 29.5 pg (ref 26.0–34.0)
MCHC: 33.8 g/dL (ref 32.0–36.0)
MCV: 87.3 fL (ref 80.0–100.0)
MONO ABS: 0.6 10*3/uL (ref 0.2–1.0)
Monocytes Relative: 12 %
NEUTROS ABS: 3 10*3/uL (ref 1.4–6.5)
NEUTROS PCT: 63 %
Platelets: 188 10*3/uL (ref 150–440)
RBC: 4.31 MIL/uL — AB (ref 4.40–5.90)
RDW: 15.2 % — AB (ref 11.5–14.5)
WBC: 4.8 10*3/uL (ref 3.8–10.6)

## 2017-09-15 LAB — COMPREHENSIVE METABOLIC PANEL
ALBUMIN: 4.1 g/dL (ref 3.5–5.0)
ALK PHOS: 69 U/L (ref 38–126)
ALT: 13 U/L — ABNORMAL LOW (ref 17–63)
ANION GAP: 7 (ref 5–15)
AST: 29 U/L (ref 15–41)
BILIRUBIN TOTAL: 0.9 mg/dL (ref 0.3–1.2)
BUN: 14 mg/dL (ref 6–20)
CALCIUM: 9.6 mg/dL (ref 8.9–10.3)
CO2: 23 mmol/L (ref 22–32)
Chloride: 107 mmol/L (ref 101–111)
Creatinine, Ser: 1.13 mg/dL (ref 0.61–1.24)
GFR, EST NON AFRICAN AMERICAN: 57 mL/min — AB (ref >60)
Glucose, Bld: 107 mg/dL — ABNORMAL HIGH (ref 65–99)
Potassium: 4.1 mmol/L (ref 3.5–5.1)
Sodium: 137 mmol/L (ref 135–145)
TOTAL PROTEIN: 7.7 g/dL (ref 6.5–8.1)

## 2017-09-15 LAB — TSH: TSH: 1.201 u[IU]/mL (ref 0.350–4.500)

## 2017-09-15 LAB — FOLATE: FOLATE: 12.4 ng/mL (ref >5.9)

## 2017-09-15 LAB — VITAMIN B12: Vitamin B-12: 878 pg/mL (ref 180–914)

## 2017-09-15 LAB — PSA: Prostatic Specific Antigen: 1.79 ng/mL (ref 0.00–4.00)

## 2017-09-15 NOTE — Progress Notes (Signed)
Pt in for follow up, states "been doing well', denies any difficulties or concerns.

## 2017-10-05 ENCOUNTER — Telehealth: Payer: Self-pay | Admitting: Pharmacist

## 2017-10-05 ENCOUNTER — Inpatient Hospital Stay: Payer: Medicare Other | Attending: Oncology

## 2017-10-05 ENCOUNTER — Inpatient Hospital Stay (HOSPITAL_BASED_OUTPATIENT_CLINIC_OR_DEPARTMENT_OTHER): Payer: Medicare Other | Admitting: Oncology

## 2017-10-05 VITALS — BP 134/76 | HR 58 | Temp 96.0°F | Resp 18 | Wt 188.4 lb

## 2017-10-05 DIAGNOSIS — C61 Malignant neoplasm of prostate: Secondary | ICD-10-CM | POA: Insufficient documentation

## 2017-10-05 DIAGNOSIS — C7951 Secondary malignant neoplasm of bone: Secondary | ICD-10-CM | POA: Diagnosis not present

## 2017-10-05 DIAGNOSIS — R202 Paresthesia of skin: Secondary | ICD-10-CM | POA: Insufficient documentation

## 2017-10-05 DIAGNOSIS — M1990 Unspecified osteoarthritis, unspecified site: Secondary | ICD-10-CM | POA: Diagnosis not present

## 2017-10-05 DIAGNOSIS — R200 Anesthesia of skin: Secondary | ICD-10-CM | POA: Diagnosis not present

## 2017-10-05 DIAGNOSIS — R972 Elevated prostate specific antigen [PSA]: Secondary | ICD-10-CM

## 2017-10-05 DIAGNOSIS — I2510 Atherosclerotic heart disease of native coronary artery without angina pectoris: Secondary | ICD-10-CM | POA: Insufficient documentation

## 2017-10-05 DIAGNOSIS — Z79818 Long term (current) use of other agents affecting estrogen receptors and estrogen levels: Secondary | ICD-10-CM

## 2017-10-05 DIAGNOSIS — Z87891 Personal history of nicotine dependence: Secondary | ICD-10-CM

## 2017-10-05 DIAGNOSIS — R351 Nocturia: Secondary | ICD-10-CM

## 2017-10-05 DIAGNOSIS — Z923 Personal history of irradiation: Secondary | ICD-10-CM

## 2017-10-05 DIAGNOSIS — M899 Disorder of bone, unspecified: Secondary | ICD-10-CM

## 2017-10-05 DIAGNOSIS — Z79899 Other long term (current) drug therapy: Secondary | ICD-10-CM | POA: Diagnosis not present

## 2017-10-05 DIAGNOSIS — K219 Gastro-esophageal reflux disease without esophagitis: Secondary | ICD-10-CM

## 2017-10-05 LAB — COMPREHENSIVE METABOLIC PANEL
ALBUMIN: 3.8 g/dL (ref 3.5–5.0)
ALT: 13 U/L — ABNORMAL LOW (ref 17–63)
AST: 25 U/L (ref 15–41)
Alkaline Phosphatase: 57 U/L (ref 38–126)
Anion gap: 8 (ref 5–15)
BUN: 12 mg/dL (ref 6–20)
CHLORIDE: 107 mmol/L (ref 101–111)
CO2: 24 mmol/L (ref 22–32)
CREATININE: 1.08 mg/dL (ref 0.61–1.24)
Calcium: 9.4 mg/dL (ref 8.9–10.3)
GFR calc non Af Amer: >60 mL/min (ref >60)
GLUCOSE: 98 mg/dL (ref 65–99)
Potassium: 3.9 mmol/L (ref 3.5–5.1)
SODIUM: 139 mmol/L (ref 135–145)
Total Bilirubin: 0.9 mg/dL (ref 0.3–1.2)
Total Protein: 7.3 g/dL (ref 6.5–8.1)

## 2017-10-05 LAB — CBC WITH DIFFERENTIAL/PLATELET
Basophils Absolute: 0 10*3/uL (ref 0–0.1)
Basophils Relative: 0 %
EOS ABS: 0.1 10*3/uL (ref 0–0.7)
Eosinophils Relative: 3 %
HCT: 36.9 % — ABNORMAL LOW (ref 40.0–52.0)
HEMOGLOBIN: 12.5 g/dL — AB (ref 13.0–18.0)
LYMPHS ABS: 1 10*3/uL (ref 1.0–3.6)
Lymphocytes Relative: 21 %
MCH: 29.2 pg (ref 26.0–34.0)
MCHC: 33.8 g/dL (ref 32.0–36.0)
MCV: 86.4 fL (ref 80.0–100.0)
MONO ABS: 0.5 10*3/uL (ref 0.2–1.0)
Monocytes Relative: 11 %
NEUTROS PCT: 65 %
Neutro Abs: 3 10*3/uL (ref 1.4–6.5)
Platelets: 182 10*3/uL (ref 150–440)
RBC: 4.27 MIL/uL — ABNORMAL LOW (ref 4.40–5.90)
RDW: 14 % (ref 11.5–14.5)
WBC: 4.5 10*3/uL (ref 3.8–10.6)

## 2017-10-05 MED ORDER — ABIRATERONE ACETATE 250 MG PO TABS: 1000.0000 mg | ORAL_TABLET | Freq: Every day | ORAL | 0 refills | Status: DC

## 2017-10-05 MED ORDER — PREDNISONE 5 MG PO TABS: 5.0000 mg | ORAL_TABLET | Freq: Every day | ORAL | 0 refills | Status: DC

## 2017-10-05 NOTE — Telephone Encounter (Signed)
Oral Oncology Pharmacist Encounter  Received new prescription for Zytiga (abiraterone) for the treatment of metastatic prostate cancer in conjunction with prednisone, planned duration until disease progression or unacceptable drug toxicity.  CMP from 10/05/17 assessed, no relevant lab abnormalities. Prescription dose and frequency assessed.   Current medication list in Epic reviewed, DDIs with Zytiga identified: - Zytiga may increase the concentration of the patient's nortriptyline, he should be monitored for signs/symptoms of toxicity related to increased concentrations. He is currently taking a low dose at bedtime.  - Zytiga may increase the concentration of the patient's tamsulosin, he should be monitored for signs/symptoms of toxicity related to increased concentrations -Zytiga may decrease the concentration of the patient's tramadol.  Prescription has been e-scribed to the [facility] for benefits analysis and approval.  Patient education Counseled patient on administration, dosing, side effects, monitoring, drug-food interactions, safe handling, storage, and disposal. Patient will take Zytiga 4 tablets (1,000 mg total) by mouth daily. Take on an empty stomach 1 hour before or 2 hours after a meal. He will also take prednisone 1 tablet (5 mg total) by mouth daily with breakfast.  Side effects include but not limited to: Increased BP, hot flashes, fatigue.    Reviewed with patient importance of keeping a medication schedule and plan for any missed doses.  Mr. Shin voiced understanding and appreciation. All questions answered. Medication handout provided and consent obtained.  Oral Oncology Clinic will continue to follow for insurance authorization, copayment issues, and start date.  Provided patient with [facility] phone number. Patient knows to call the office with questions or concerns. Oral Chemotherapy [facility] Clinic will continue to  follow.  Darl Pikes, PharmD, BCPS Hematology/Oncology Clinical Pharmacist [facility] [phone]  10/05/2017 11:22 AM

## 2017-10-05 NOTE — Progress Notes (Signed)
Hematology/Oncology  Follow up note [facility] [phone] [phone]  Patient Care Team: [place] Pink, MD as PCP - General (Family Medicine)  REASON FOR VISIT Follow up for treatment of Prostate Cancer  HISTORY OF PRESENTING ILLNESS:  Matthew Hunter 84 y.o.  male with PMH listed as below who was referred by [facility] Urology Provider [name] to me for evaluation of clinical prostate cancer and management.  He has a history of elevated PSA and BPH. PSA trends as follows: 11/14/2014 4.3, 11/07/2015 7.1, 05/11/2016 10.4, 06/11/2016 10.6, 07/07/2016 7.8, 01/06/2017 86.8.  He was seen by urology and was diagnosed with clinical prostate cancer given the extreme elevation of PSA and prostate nodule. Biopsy was not pursued. He was treated with [name] on 01/29/2017,# patient got prostate biopsy on 03/10/2017. 5 out of 6 cores positive for prostate cancer, with highest gleason score group 9 (4+5), + perineural invasion. Xtandi started December 2019.    started on Lupron Q60month on 04/12/2017, follows up with urology for ADT.   # 02/03/2017 Bone scan showed multiple abnormal foci of increased osseous tracer accumulation compatible with widespread osseus metastatic disease. CT abdomen showed no evidence of metastatic disease, left adrenal nodule is indeterminate by relative washout characteristics but appears grossly stable in size from 2008, favoring adenoma.    INTERVAL HISTORY Patient presents to follow up on management of prostate cancer. Memory loss has become better since discontinuation of Xtandi.  He reports left lower extremity numbness and tingling, intermittent. Otherwise doing well.   PSA 86.8 -->64.78 -->131--> 242 added Xtanti --> 17.74-->1.82 Xtandi stopped--> 1.79 Current treatment  [place], switched to leupron on 04/13/2015,  Q6 months.  .Testosterone level at 14, castration level.  Upfront XGillermina Phywas  added on December 2019 to assist fast disease control (phase II trial [place] B, Lancet Oncol. 2014) S/p palliative RT to lumber spine and SI joints.     ROS:  Review of Systems  Constitutional: Negative for appetite change, chills, diaphoresis, fatigue, fever and unexpected weight change.  HENT:   Negative for hearing loss, lump/mass, mouth sores, nosebleeds and sore throat.   Eyes: Negative for eye problems and icterus.  Respiratory: Negative for chest tightness, cough, hemoptysis, shortness of breath and wheezing.   Cardiovascular: Negative for chest pain and leg swelling.  Gastrointestinal: Negative for abdominal distention, abdominal pain, blood in stool, diarrhea, nausea and rectal pain.  Endocrine: Negative for hot flashes.  Genitourinary: Positive for nocturia. Negative for bladder incontinence, difficulty urinating, dysuria, frequency and hematuria.   Musculoskeletal: Negative for back pain, flank pain, gait problem and myalgias.  Skin: Negative for itching and rash.  Neurological: Negative for dizziness, gait problem, headaches, numbness and seizures.  Hematological: Negative for adenopathy. Does not bruise/bleed easily.  Psychiatric/Behavioral: Negative for confusion, decreased concentration and depression. The patient is not nervous/anxious.     MEDICAL HISTORY:  Past Medical History:  Diagnosis Date  . Arthritis   . Benign enlargement of prostate   . CAD (coronary artery disease)   . [place]   . Constipation   . Elevated PSA   . GERD (gastroesophageal reflux disease)   . Hypertension   . Incomplete bladder emptying   . Nocturia   . [place]  . Urgency of micturation   . Urinary frequency     SURGICAL HISTORY: Past Surgical History:  Procedure Laterality Date  . GALLBLADDER SURGERY    . PROSTATE SURGERY    .  STOMACH SURGERY      SOCIAL HISTORY: Social History   Socioeconomic History  . Marital status: Married    Spouse name:  Not on file  . Number of children: Not on file  . Years of education: Not on file  . Highest education level: Not on file  Occupational History  . Not on file  Social Needs  . Financial resource strain: Not on file  . Food insecurity:    Worry: Not on file    Inability: Not on file  . Transportation needs:    Medical: Not on file    Non-medical: Not on file  Tobacco Use  . Smoking status: Former Smoker    Last attempt to quit: 06/01/1998    Years since quitting: 19.3  . Smokeless tobacco: Never Used  Substance and Sexual Activity  . Alcohol use: No    Alcohol/week: 0.0 oz  . Drug use: Yes    Frequency: 2.0 times per week    Comment: marijuana two time monthly gives him an appetite  . Sexual activity: Not on file  Lifestyle  . Physical activity:    Days per week: Not on file    Minutes per session: Not on file  . Stress: Not on file  Relationships  . Social connections:    Talks on phone: Not on file    Gets together: Not on file    Attends religious service: Not on file    Active member of club or organization: Not on file    Attends meetings of clubs or organizations: Not on file    Relationship status: Not on file  . Intimate partner violence:    Fear of current or ex partner: Not on file    Emotionally abused: Not on file    Physically abused: Not on file    Forced sexual activity: Not on file  Other Topics Concern  . Not on file  Social History Narrative  . Not on file    FAMILY HISTORY: Family History  Problem Relation Age of Onset  . Hypertension Mother   . Stroke Father   . Kidney disease Neg Hx   . Prostate cancer Neg Hx   . Kidney cancer Neg Hx   . Bladder Cancer Neg Hx     ALLERGIES:  has No Known Allergies.  MEDICATIONS:  Current Outpatient Medications  Medication Sig Dispense Refill  . aspirin EC 81 MG tablet Take 81 mg by mouth daily.     Marland Kitchen atenolol (TENORMIN) 100 MG tablet Take 100 mg by mouth daily.     Marland Kitchen CALCIUM-VITAMIN D PO Take 1  tablet by mouth 2 (two) times daily.     Marland Kitchen dutasteride (AVODART) 0.5 MG capsule Take 1 capsule (0.5 mg total) by mouth daily. 30 capsule 0  . finasteride (PROSCAR) 5 MG tablet Take 1 tablet (5 mg total) by mouth daily. 90 tablet 3  . HYDROcodone-acetaminophen (NORCO) 5-325 MG tablet Take 1 tablet by mouth every 6 (six) hours as needed for severe pain. 60 tablet 0  . hydrocortisone 2.5 % cream APP EXT AA BID  0  . mirabegron ER (MYRBETRIQ) 25 MG TB24 tablet Take 1 tablet (25 mg total) by mouth daily. 90 tablet 3  . nortriptyline (PAMELOR) 25 MG capsule Take 25 mg by mouth at bedtime. Reported on 05/16/2015  1  . nystatin (MYCOSTATIN) 100000 UNIT/ML suspension SWISH WITH 1 TEASPOONFUL BY MOUTH AND THEN SWALLOW. USE 4 TIMES A...  (REFER TO PRESCRIPTION NOTES).  0  . omeprazole (PRILOSEC) 20 MG capsule Take 20 mg by mouth daily.    Marland Kitchen pyridOXINE (VITAMIN B-6) 50 MG tablet Take 50 mg by mouth daily.     . tamsulosin (FLOMAX) 0.4 MG CAPS capsule Take 1 capsule (0.4 mg total) by mouth daily. 90 capsule 3  . Telmisartan-Amlodipine 80-5 MG TABS Take 1 tablet by mouth daily.     . traMADol (ULTRAM) 50 MG tablet take 1 tablet by mouth once daily if needed  0   No current facility-administered medications for this visit.       Marland Kitchen  PHYSICAL EXAMINATION: ECOG PERFORMANCE STATUS: 1 - Symptomatic but completely ambulatory Vitals:   10/06/17 1331  BP: 134/76  Pulse: (!) 58  Resp: 18  Temp: (!) 96 F (35.6 C)  SpO2: 99%   Filed Weights   10/06/17 1331  Weight: 188 lb 7 oz (85.5 kg)   Physical Exam  Constitutional: He is oriented to person, place, and time. He appears well-developed and well-nourished. No distress.  HENT:  Head: Normocephalic and atraumatic.  Right Ear: External ear normal.  Left Ear: External ear normal.  Mouth/Throat: Oropharynx is clear and moist. No oropharyngeal exudate.  Eyes: Pupils are equal, round, and reactive to light. Conjunctivae and EOM are normal. No scleral  icterus.  Neck: Normal range of motion. Neck supple. No JVD present. No thyromegaly present.  Cardiovascular: Normal rate, regular rhythm and normal heart sounds.  No murmur heard. Pulmonary/Chest: Effort normal and breath sounds normal. No stridor. No respiratory distress. He has no wheezes. He has no rales. He exhibits no tenderness.  Abdominal: Soft. Bowel sounds are normal. He exhibits no distension and no mass. There is no tenderness. There is no guarding.  Musculoskeletal: Normal range of motion. He exhibits no edema or deformity.  Lymphadenopathy:    He has no cervical adenopathy.  Neurological: He is alert and oriented to person, place, and time. He displays normal reflexes. No cranial nerve deficit. Coordination normal.  Skin: Skin is warm and dry. No rash noted. He is not diaphoretic. No erythema.  Psychiatric: He has a normal mood and affect. His behavior is normal. Thought content normal.   LABORATORY DATA:  I have reviewed the data as listed Lab Results  Component Value Date   WBC 4.5 10/05/2017   HGB 12.5 (L) 10/05/2017   HCT 36.9 (L) 10/05/2017   MCV 86.4 10/05/2017   PLT 182 10/05/2017   Recent Labs    08/18/17 1418 09/15/17 1004 10/05/17 0929  NA 140 137 139  K 4.3 4.1 3.9  CL 109 107 107  CO2 19* 23 24  GLUCOSE 139* 107* 98  BUN '11 14 12  '$ CREATININE 1.00 1.13 1.08  CALCIUM 9.4 9.6 9.4  GFRNONAA >60 57* >60  GFRAA >60 >60 >60  PROT 7.8 7.7 7.3  ALBUMIN 4.1 4.1 3.8  AST '26 29 25  '$ ALT 13* 13* 13*  ALKPHOS 79 69 57  BILITOT 1.1 0.9 0.9    RADIOGRAPHIC STUDIES: I have personally reviewed the radiological images as listed and agreed with the findings in the report. 02/03/2017  Bone scan IMPRESSION: Multiple abnormal foci of with of increased osseous tracer accumulation compatible with widespread osseous metastatic disease as above.  02/03/2017 CT abdomen pelvis w contrast.  IMPRESSION: 1. No evidence of metastatic disease. 2. Left adrenal nodule is  indeterminate by relative washout characteristics but appears grossly stable in size from 02/28/2007, favoring a benign adenoma. 3. Left renal stone. 4.  Aortic atherosclerosis (ICD10-170.0).   03/10/2017 PART A: Prostate CNB, Left, Base  PART B: Prostate CNB, Left, Mid  PART C: Prostate CNB, Left, Apex  PART D: Prostate CNB, Right, Base  PART E: Prostate CNB, Right, Mid  PART F: Prostate CNB, Right, Apex   Comment Comment   Comment: Clinical history:                     .  PSA 64.78, DRE Normal, Prev Bx Positive   Comment Comment   Diagnosis:  A. PROSTATIC ADENOCARCINOMA. GLEASON'S SCORE 8 (GRADES 4 + 4) NOTED IN 2  OUT OF 2 SUBMITTED PROSTATE CORE SEGMENTS. APPROXIMATELY 71% OF SUBMITTED  TISSUE INVOLVED. PERINEURAL INVASION IS PRESENT. (GRADE GROUP 4)  B. PROSTATIC ADENOCARCINOMA. GLEASON'S SCORE 9 (GRADES 4 + 5) NOTED IN 1  OUT OF 1 SUBMITTED PROSTATE CORE SEGMENTS. APPROXIMATELY 55% OF SUBMITTED  TISSUE INVOLVED. (GRADE GROUP 5)  C. PROSTATIC ADENOCARCINOMA. GLEASON'S SCORE 8 (GRADES 4 + 4) NOTED IN 2  OUT OF 2 SUBMITTED PROSTATE CORE SEGMENTS. APPROXIMATELY 68% OF SUBMITTED  TISSUE INVOLVED. PERINEURAL INVASION IS PRESENT. (GRADE GROUP 4)  D. BENIGN PROSTATE TISSUE. NO EVIDENCE OF MALIGNANCY.  E. PROSTATIC ADENOCARCINOMA. GLEASON'S SCORE 9 (GRADES 4 + 5) NOTED IN 1  OUT OF 1 SUBMITTED PROSTATE CORE SEGMENTS. APPROXIMATELY 6% OF SUBMITTED  TISSUE INVOLVED. (GRADE GROUP 5)  F. BENIGN PROSTATE TISSUE. NO EVIDENCE OF MALIGNANCY.   . Comment:   Comment: A. Grade groups range from 1 (most favorable) to 5 (least favorable).  Pierorazio et al. BJU Int 111: 099-83, 2013. Epstein et al.EUR UROL 69:  428-35, 2016.  B. Grade groups range from 1 (most favorable) to 5 (least favorable).  Pierorazio et al. BJU Int 111: 382-50, 2013. Epstein et al.EUR UROL 69:  428-35, 2016.  C. Grade groups range from 1 (most favorable) to 5 (least favorable).  Pierorazio  et al. BJU Int 111: 539-76, 2013. Epstein et al.EUR UROL 69:  428-35, 2016.  E. Grade groups range from 1 (most favorable) to 5 (least favorable).  Pierorazio et al. BJU Int 111: 734-19, 2013. Epstein et al.EUR UROL 69:  428-35, 2016.      ASSESSMENT & PLAN: 84 yo male follows up for management of metastatic prostate cancer .  1. Prostate cancer ([place])   2. Prostate cancer metastatic to bone [facility])    # PSA continue to drop, PSA is 1.79  At last visit.  Continue ADT Q6 months, last dose was given by urology on  04/12/2017. Will contact urology for follow up or we can give Leupron here.  Plan add Zytiga and prednisone. Rationale and side effects disucssed with patient. He voices understanding and willing to proceed.  He met pharmacist today as well.  All questions were answered. The patient knows to call the clinic with any problems questions or concerns.  Return of visit: 4 weeks with repeat labs  Earlie Server, MD, PhD Hematology Oncology [facility] Pager- [phone] 10/05/2017

## 2017-10-05 NOTE — Progress Notes (Signed)
DISCONTINUE ON PATHWAY REGIMEN - Prostate     Daily:     Enzalutamide   **Always confirm dose/schedule in your pharmacy ordering system**    REASON: Toxicities / Adverse Event PRIOR TREATMENT: POS59: Enzalutamide 160 mg Daily Until Progression or Toxicity TREATMENT RESPONSE: Unable to Evaluate  START ON PATHWAY REGIMEN - Prostate   Leuprolide 22.5 mg q3 months:   A cycle is every 3 months:     Leuprolide acetate depot   **Always confirm dose/schedule in your pharmacy ordering system**    Abiraterone 1000 mg Daily + Prednisone 5 mg Daily:   Daily:     Abiraterone acetate      Prednisone   **Always confirm dose/schedule in your pharmacy ordering system**    Patient Characteristics: Adenocarcinoma, Metastatic, Hormone Naive, High Volume Disease* Current radiographic evidence of distant metastasis<= Yes Histology: Adenocarcinoma AJCC T Category: cTX Gleason Primary: 4 AJCC N Category: NX Gleason Secondary: 5 AJCC M Category: M1b Gleason Score: 9 AJCC 8 Stage Grouping: IVB PSA Values (ng/mL): ? 20  Intent of Therapy: Non-Curative / Palliative Intent, Discussed with Patient

## 2017-10-06 MED ORDER — ABIRATERONE ACETATE 250 MG PO TABS: 1000.0000 mg | ORAL_TABLET | Freq: Every day | ORAL | 0 refills | Status: DC

## 2017-10-07 ENCOUNTER — Telehealth: Payer: Self-pay | Admitting: Pharmacy Technician

## 2017-10-07 ENCOUNTER — Encounter: Payer: Self-pay | Admitting: Oncology

## 2017-10-07 NOTE — Telephone Encounter (Signed)
Oral Oncology Pharmacist Encounter   Prior Authorization for Zytiga (abiraterone) has been approved.     PA# [id number] Effective dates: 10/07/17 through 05/31/18   I will place a copy of the approval letter to be scanned into patient's chart.  Oral Oncology Clinic will continue to follow.   Darl Pikes, PharmD, BCPS Hematology/Oncology Clinical Pharmacist [facility] [place] [facility] [phone]  10/07/2017 1:35 PM

## 2017-10-07 NOTE — Telephone Encounter (Signed)
Oral Oncology Patient Advocate Encounter  Received notification from [facility] that prior authorization for Matthew Hunter is required.  PA submitted on CoverMyMeds [facility] Status is pending  Oral Oncology Clinic will continue to follow.  Matthew Hunter. Melynda Keller, [place] Patient [place] [phone] 10/07/2017 11:52 AM

## 2017-10-10 ENCOUNTER — Other Ambulatory Visit: Payer: Self-pay | Admitting: Oncology

## 2017-10-12 ENCOUNTER — Inpatient Hospital Stay: Payer: Medicare Other

## 2017-10-12 DIAGNOSIS — C61 Malignant neoplasm of prostate: Secondary | ICD-10-CM | POA: Diagnosis not present

## 2017-10-12 MED ORDER — LEUPROLIDE ACETATE (6 MONTH) 45 MG IM KIT
45.0000 mg | PACK | Freq: Once | INTRAMUSCULAR | Status: AC
  Administered 2017-10-12: 45 mg via INTRAMUSCULAR
  Filled 2017-10-12: qty 45

## 2017-10-18 ENCOUNTER — Telehealth: Payer: Self-pay | Admitting: Pharmacist

## 2017-10-18 NOTE — Telephone Encounter (Signed)
Oral Chemotherapy Pharmacist Encounter  Successfully enrolled patient for copayment assistance funds from [employer] [place] (PAF) from the metastatic prostate cancer fund. Award amount: $6500 Effective dates: 10/18/17 - 10/19/18 ID: [id number] BIN: [id number] Group: [id number] PCN: [id number]  Billing information will be shared with the [place]. Patient will receive welcome packet from PAF in 7-10 business days.  Johny Drilling, PharmD, BCPS, BCOP 10/18/2017 3:34 PM Oral Oncology Clinic [phone]

## 2017-10-21 ENCOUNTER — Telehealth: Payer: Self-pay | Admitting: Pharmacist

## 2017-10-21 NOTE — Telephone Encounter (Signed)
Thanks

## 2017-10-21 NOTE — Telephone Encounter (Signed)
Oral Chemotherapy Pharmacist Encounter  Successfully enrolled patient in [place] (abiraterone) assistance through [employer] [facility]. Medication will be provided to Mr. Salsgiver at no cost. This is a temporary approval, Mr. Channing has to sign and turn in a Medicare Part D Attestation Form. Call Mr. Fedewa and asked that he ask for me next time he is in the office so that the form can be signed.   Effective dates: 10/18/17 - 12/17/17 (once the edicare Part D Attestation Form is signed and return he will have approval until 05/31/18)  Medication was delivered to Mr. Godinho today 10/21/17.  Darl Pikes, PharmD, BCPS Hematology/Oncology Clinical Pharmacist [facility] [place] [facility] [phone]  10/21/2017 1:45 PM

## 2017-11-02 ENCOUNTER — Inpatient Hospital Stay: Payer: Medicare Other | Admitting: Oncology

## 2017-11-02 ENCOUNTER — Inpatient Hospital Stay: Payer: Medicare Other | Attending: Oncology

## 2017-11-02 ENCOUNTER — Encounter: Payer: Self-pay | Admitting: Oncology

## 2017-11-02 ENCOUNTER — Other Ambulatory Visit: Payer: Self-pay

## 2017-11-02 VITALS — BP 155/77 | HR 79 | Temp 98.5°F | Wt 194.5 lb

## 2017-11-02 DIAGNOSIS — I10 Essential (primary) hypertension: Secondary | ICD-10-CM | POA: Diagnosis not present

## 2017-11-02 DIAGNOSIS — Z79899 Other long term (current) drug therapy: Secondary | ICD-10-CM | POA: Diagnosis not present

## 2017-11-02 DIAGNOSIS — Z79818 Long term (current) use of other agents affecting estrogen receptors and estrogen levels: Secondary | ICD-10-CM

## 2017-11-02 DIAGNOSIS — M899 Disorder of bone, unspecified: Secondary | ICD-10-CM

## 2017-11-02 DIAGNOSIS — Z7982 Long term (current) use of aspirin: Secondary | ICD-10-CM | POA: Insufficient documentation

## 2017-11-02 DIAGNOSIS — K219 Gastro-esophageal reflux disease without esophagitis: Secondary | ICD-10-CM | POA: Diagnosis not present

## 2017-11-02 DIAGNOSIS — C61 Malignant neoplasm of prostate: Secondary | ICD-10-CM | POA: Insufficient documentation

## 2017-11-02 DIAGNOSIS — R972 Elevated prostate specific antigen [PSA]: Secondary | ICD-10-CM

## 2017-11-02 DIAGNOSIS — Z7952 Long term (current) use of systemic steroids: Secondary | ICD-10-CM

## 2017-11-02 DIAGNOSIS — M549 Dorsalgia, unspecified: Secondary | ICD-10-CM | POA: Insufficient documentation

## 2017-11-02 DIAGNOSIS — C7951 Secondary malignant neoplasm of bone: Secondary | ICD-10-CM | POA: Diagnosis not present

## 2017-11-02 DIAGNOSIS — N400 Enlarged prostate without lower urinary tract symptoms: Secondary | ICD-10-CM | POA: Insufficient documentation

## 2017-11-02 DIAGNOSIS — I2510 Atherosclerotic heart disease of native coronary artery without angina pectoris: Secondary | ICD-10-CM | POA: Insufficient documentation

## 2017-11-02 DIAGNOSIS — M1990 Unspecified osteoarthritis, unspecified site: Secondary | ICD-10-CM | POA: Insufficient documentation

## 2017-11-02 DIAGNOSIS — Z923 Personal history of irradiation: Secondary | ICD-10-CM

## 2017-11-02 DIAGNOSIS — Z87891 Personal history of nicotine dependence: Secondary | ICD-10-CM

## 2017-11-02 LAB — COMPREHENSIVE METABOLIC PANEL
ALK PHOS: 61 U/L (ref 38–126)
ALT: 14 U/L — ABNORMAL LOW (ref 17–63)
ANION GAP: 8 (ref 5–15)
AST: 27 U/L (ref 15–41)
Albumin: 3.9 g/dL (ref 3.5–5.0)
BUN: 11 mg/dL (ref 6–20)
CALCIUM: 9.5 mg/dL (ref 8.9–10.3)
CHLORIDE: 109 mmol/L (ref 101–111)
CO2: 24 mmol/L (ref 22–32)
CREATININE: 1.06 mg/dL (ref 0.61–1.24)
Glucose, Bld: 94 mg/dL (ref 65–99)
Potassium: 3.6 mmol/L (ref 3.5–5.1)
SODIUM: 141 mmol/L (ref 135–145)
Total Bilirubin: 1 mg/dL (ref 0.3–1.2)
Total Protein: 7.4 g/dL (ref 6.5–8.1)

## 2017-11-02 LAB — CBC WITH DIFFERENTIAL/PLATELET
BASOS PCT: 0 %
Basophils Absolute: 0 10*3/uL (ref 0–0.1)
EOS ABS: 0.1 10*3/uL (ref 0–0.7)
Eosinophils Relative: 1 %
HEMATOCRIT: 37.1 % — AB (ref 40.0–52.0)
HEMOGLOBIN: 12.6 g/dL — AB (ref 13.0–18.0)
Lymphocytes Relative: 15 %
Lymphs Abs: 0.9 10*3/uL — ABNORMAL LOW (ref 1.0–3.6)
MCH: 29.2 pg (ref 26.0–34.0)
MCHC: 34 g/dL (ref 32.0–36.0)
MCV: 85.8 fL (ref 80.0–100.0)
Monocytes Absolute: 0.4 10*3/uL (ref 0.2–1.0)
Monocytes Relative: 7 %
NEUTROS ABS: 4.6 10*3/uL (ref 1.4–6.5)
NEUTROS PCT: 77 %
Platelets: 198 10*3/uL (ref 150–440)
RBC: 4.32 MIL/uL — AB (ref 4.40–5.90)
RDW: 14.5 % (ref 11.5–14.5)
WBC: 5.9 10*3/uL (ref 3.8–10.6)

## 2017-11-02 LAB — PSA: PROSTATIC SPECIFIC ANTIGEN: 11.06 ng/mL — AB (ref 0.00–4.00)

## 2017-11-02 NOTE — Progress Notes (Signed)
Hematology/Oncology  Follow up note [facility] Telephone:([phone]) [phone] [phone]) [phone]  Patient Care Team: [place] Pink, MD as PCP - General (Family Medicine)  REASON FOR VISIT Follow up for antineoplasm treatment of Prostate Cancer  HISTORY OF PRESENTING ILLNESS:  Matthew Hunter 84 y.o.  male with PMH listed as below who was referred by [facility] Urology Provider [name] to me for evaluation of clinical prostate cancer and management.  He has a history of elevated PSA and BPH. PSA trends as follows: 11/14/2014 4.3, 11/07/2015 7.1, 05/11/2016 10.4, 06/11/2016 10.6, 07/07/2016 7.8, 01/06/2017 86.8.  He was seen by urology and was diagnosed with clinical prostate cancer given the extreme elevation of PSA and prostate nodule. Biopsy was not pursued. He was treated with [name] on 01/29/2017,# patient got prostate biopsy on 03/10/2017. 5 out of 6 cores positive for prostate cancer, with highest gleason score group 9 (4+5), + perineural invasion. Xtandi started December 2019.    started on Lupron Q19months on 04/12/2017, follows up with urology for ADT.   # 02/03/2017 Bone scan showed multiple abnormal foci of increased osseous tracer accumulation compatible with widespread osseus metastatic disease. CT abdomen showed no evidence of metastatic disease, left adrenal nodule is indeterminate by relative washout characteristics but appears grossly stable in size from 2008, favoring adenoma.    INTERVAL HISTORY Patient presents to follow up on antineoplasm management of prostate cancer.  1 Memory loss is better since discontinuation of Xtandi.  2 S/p Leupron injection on 10/12/2017. 3 Back pain: stable. No pain currently.     PSA 86.8 -->64.78 -->131--> 242 added Xtanti --> 17.74-->1.82 Xtandi stopped--> 1.79 Reports feeling better since started on Abiaterone and Prednisone. He has gained 4-5 pounds and has good appetite. Less fatigued.   Current treatment  Patient got  first Firmagon on 01/29/2018, switched to leupron on 04/13/2015,  Q6 months.  .Testosterone level at 14, castration level.  Upfront Gillermina Phy was added on December 2019 to assist fast disease control (phase II trial [place] B, [facility] Oncol. 2014) S/p palliative RT to lumber spine and SI joints.     ROS:  Review of Systems  Constitutional: Negative for appetite change, chills, diaphoresis, fatigue, fever and unexpected weight change.  HENT:   Negative for hearing loss, lump/mass, mouth sores, nosebleeds and sore throat.   Eyes: Negative for eye problems and icterus.  Respiratory: Negative for chest tightness, cough, hemoptysis, shortness of breath and wheezing.   Cardiovascular: Negative for chest pain and leg swelling.  Gastrointestinal: Negative for abdominal distention, abdominal pain, blood in stool, diarrhea, nausea and rectal pain.  Endocrine: Negative for hot flashes.  Genitourinary: Positive for nocturia. Negative for bladder incontinence, difficulty urinating, dysuria, frequency and hematuria.   Musculoskeletal: Negative for back pain, flank pain, gait problem and myalgias.  Skin: Negative for itching and rash.  Neurological: Negative for dizziness, gait problem, headaches, numbness and seizures.  Hematological: Negative for adenopathy. Does not bruise/bleed easily.  Psychiatric/Behavioral: Negative for confusion, decreased concentration and depression. The patient is not nervous/anxious.     MEDICAL HISTORY:  Past Medical History:  Diagnosis Date  . Arthritis   . Benign enlargement of prostate   . CAD (coronary artery disease)   . Cancer ([place])   . Constipation   . Elevated PSA   . GERD (gastroesophageal reflux disease)   . Hypertension   . Incomplete bladder emptying   . Nocturia   . Prostate cancer ([place]) 05/03/2017  . Urgency of micturation   . Urinary  frequency     SURGICAL HISTORY: Past Surgical History:  Procedure Laterality Date  . GALLBLADDER SURGERY    .  PROSTATE SURGERY    . STOMACH SURGERY      SOCIAL HISTORY: Social History   Socioeconomic History  . Marital status: Married    Spouse name: Not on file  . Number of children: Not on file  . Years of education: Not on file  . Highest education level: Not on file  Occupational History  . Not on file  Social Needs  . Financial resource strain: Not on file  . Food insecurity:    Worry: Not on file    Inability: Not on file  . Transportation needs:    Medical: Not on file    Non-medical: Not on file  Tobacco Use  . Smoking status: Former Smoker    Last attempt to quit: 06/01/1998    Years since quitting: 19.4  . Smokeless tobacco: Never Used  Substance and Sexual Activity  . Alcohol use: No    Alcohol/week: 0.0 oz  . Drug use: Yes    Frequency: 2.0 times per week    Comment: marijuana two time monthly gives him an appetite  . Sexual activity: Not on file  Lifestyle  . Physical activity:    Days per week: Not on file    Minutes per session: Not on file  . Stress: Not on file  Relationships  . Social connections:    Talks on phone: Not on file    Gets together: Not on file    Attends religious service: Not on file    Active member of club or organization: Not on file    Attends meetings of clubs or organizations: Not on file    Relationship status: Not on file  . Intimate partner violence:    Fear of current or ex partner: Not on file    Emotionally abused: Not on file    Physically abused: Not on file    Forced sexual activity: Not on file  Other Topics Concern  . Not on file  Social History Narrative  . Not on file    FAMILY HISTORY: Family History  Problem Relation Age of Onset  . Hypertension Mother   . Stroke Father   . Kidney disease Neg Hx   . Prostate cancer Neg Hx   . Kidney cancer Neg Hx   . Bladder Cancer Neg Hx     ALLERGIES:  has No Known Allergies.  MEDICATIONS:  Current Outpatient Medications  Medication Sig Dispense Refill  .  abiraterone acetate (ZYTIGA) 250 MG tablet Take 4 tablets (1,000 mg total) by mouth daily. Take on an empty stomach 1 hour before or 2 hours after a meal 120 tablet 0  . aspirin EC 81 MG tablet Take 81 mg by mouth daily.     Marland Kitchen atenolol (TENORMIN) 100 MG tablet Take 100 mg by mouth daily.     Marland Kitchen CALCIUM-VITAMIN D PO Take 1 tablet by mouth 2 (two) times daily.     Marland Kitchen dutasteride (AVODART) 0.5 MG capsule Take 1 capsule (0.5 mg total) by mouth daily. 30 capsule 0  . finasteride (PROSCAR) 5 MG tablet Take 1 tablet (5 mg total) by mouth daily. 90 tablet 3  . hydrocortisone 2.5 % cream APP EXT AA BID  0  . mirabegron ER (MYRBETRIQ) 25 MG TB24 tablet Take 1 tablet (25 mg total) by mouth daily. 90 tablet 3  . nortriptyline (PAMELOR) 25 MG  capsule Take 25 mg by mouth at bedtime. Reported on 05/16/2015  1  . nystatin (MYCOSTATIN) 100000 UNIT/ML suspension SWISH WITH 1 TEASPOONFUL BY MOUTH AND THEN SWALLOW. USE 4 TIMES A...  (REFER TO PRESCRIPTION NOTES).  0  . omeprazole (PRILOSEC) 20 MG capsule Take 20 mg by mouth daily.    . predniSONE (DELTASONE) 5 MG tablet Take 1 tablet (5 mg total) by mouth daily with breakfast. 30 tablet 0  . pyridOXINE (VITAMIN B-6) 50 MG tablet Take 50 mg by mouth daily.     . tamsulosin (FLOMAX) 0.4 MG CAPS capsule Take 1 capsule (0.4 mg total) by mouth daily. 90 capsule 3  . Telmisartan-Amlodipine 80-5 MG TABS Take 1 tablet by mouth daily.      No current facility-administered medications for this visit.       Marland Kitchen  PHYSICAL EXAMINATION: ECOG PERFORMANCE STATUS: 1 - Symptomatic but completely ambulatory Vitals:   11/02/17 1615  BP: (!) 155/77  Pulse: 79  Temp: 98.5 F (36.9 C)   Filed Weights   11/02/17 1615  Weight: 194 lb 8 oz (88.2 kg)   Physical Exam  Constitutional: He is oriented to person, place, and time. He appears well-developed and well-nourished. No distress.  HENT:  Head: Normocephalic and atraumatic.  Right Ear: External ear normal.  Left Ear:  External ear normal.  Mouth/Throat: Oropharynx is clear and moist. No oropharyngeal exudate.  Eyes: Pupils are equal, round, and reactive to light. Conjunctivae and EOM are normal. No scleral icterus.  Neck: Normal range of motion. Neck supple. No JVD present. No thyromegaly present.  Cardiovascular: Normal rate, regular rhythm and normal heart sounds.  No murmur heard. Pulmonary/Chest: Effort normal and breath sounds normal. No stridor. No respiratory distress. He has no wheezes. He has no rales. He exhibits no tenderness.  Abdominal: Soft. Bowel sounds are normal. He exhibits no distension and no mass. There is no tenderness. There is no guarding.  Musculoskeletal: Normal range of motion. He exhibits no edema or deformity.  Lymphadenopathy:    He has no cervical adenopathy.  Neurological: He is alert and oriented to person, place, and time. He displays normal reflexes. No cranial nerve deficit. Coordination normal.  Skin: Skin is warm and dry. No rash noted. He is not diaphoretic. No erythema.  Psychiatric: He has a normal mood and affect. His behavior is normal. Thought content normal.   LABORATORY DATA:  I have reviewed the data as listed Lab Results  Component Value Date   WBC 5.9 11/02/2017   HGB 12.6 (L) 11/02/2017   HCT 37.1 (L) 11/02/2017   MCV 85.8 11/02/2017   PLT 198 11/02/2017   Recent Labs    09/15/17 1004 10/05/17 0929 11/02/17 0935  NA 137 139 141  K 4.1 3.9 3.6  CL 107 107 109  CO2 23 24 24   GLUCOSE 107* 98 94  BUN 14 12 11   CREATININE 1.13 1.08 1.06  CALCIUM 9.6 9.4 9.5  GFRNONAA 57* >60 >60  GFRAA >60 >60 >60  PROT 7.7 7.3 7.4  ALBUMIN 4.1 3.8 3.9  AST 29 25 27   ALT 13* 13* 14*  ALKPHOS 69 57 61  BILITOT 0.9 0.9 1.0    RADIOGRAPHIC STUDIES: I have personally reviewed the radiological images as listed and agreed with the findings in the report. 02/03/2017  Bone scan IMPRESSION: Multiple abnormal foci of with of increased osseous tracer accumulation  compatible with widespread osseous metastatic disease as above.  02/03/2017 CT abdomen pelvis w contrast.  IMPRESSION: 1. No evidence of metastatic disease. 2. Left adrenal nodule is indeterminate by relative washout characteristics but appears grossly stable in size from 02/28/2007, favoring a benign adenoma. 3. Left renal stone. 4.  Aortic atherosclerosis (ICD10-170.0).   03/10/2017 PART A: Prostate CNB, Left, Base  PART B: Prostate CNB, Left, Mid  PART C: Prostate CNB, Left, Apex  PART D: Prostate CNB, Right, Base  PART E: Prostate CNB, Right, Mid  PART F: Prostate CNB, Right, Apex   Comment Comment   Comment: Clinical history:                     .  PSA 64.78, DRE Normal, Prev Bx Positive   Comment Comment   Diagnosis:  A. PROSTATIC ADENOCARCINOMA. GLEASON'S SCORE 8 (GRADES 4 + 4) NOTED IN 2  OUT OF 2 SUBMITTED PROSTATE CORE SEGMENTS. APPROXIMATELY 71% OF SUBMITTED  TISSUE INVOLVED. PERINEURAL INVASION IS PRESENT. (GRADE GROUP 4)  B. PROSTATIC ADENOCARCINOMA. GLEASON'S SCORE 9 (GRADES 4 + 5) NOTED IN 1  OUT OF 1 SUBMITTED PROSTATE CORE SEGMENTS. APPROXIMATELY 55% OF SUBMITTED  TISSUE INVOLVED. (GRADE GROUP 5)  C. PROSTATIC ADENOCARCINOMA. GLEASON'S SCORE 8 (GRADES 4 + 4) NOTED IN 2  OUT OF 2 SUBMITTED PROSTATE CORE SEGMENTS. APPROXIMATELY 68% OF SUBMITTED  TISSUE INVOLVED. PERINEURAL INVASION IS PRESENT. (GRADE GROUP 4)  D. BENIGN PROSTATE TISSUE. NO EVIDENCE OF MALIGNANCY.  E. PROSTATIC ADENOCARCINOMA. GLEASON'S SCORE 9 (GRADES 4 + 5) NOTED IN 1  OUT OF 1 SUBMITTED PROSTATE CORE SEGMENTS. APPROXIMATELY 6% OF SUBMITTED  TISSUE INVOLVED. (GRADE GROUP 5)  F. BENIGN PROSTATE TISSUE. NO EVIDENCE OF MALIGNANCY.   . Comment:   Comment: A. Grade groups range from 1 (most favorable) to 5 (least favorable).  Pierorazio et al. BJU Int 111: 213-08, 2013. Epstein et al.EUR UROL 69:  428-35, 2016.  B. Grade groups range from 1 (most favorable) to 5 (least  favorable).  Pierorazio et al. BJU Int 111: 657-84, 2013. Epstein et al.EUR UROL 69:  428-35, 2016.  C. Grade groups range from 1 (most favorable) to 5 (least favorable).  Pierorazio et al. BJU Int 111: 696-29, 2013. Epstein et al.EUR UROL 69:  428-35, 2016.  E. Grade groups range from 1 (most favorable) to 5 (least favorable).  Pierorazio et al. BJU Int 111: 528-41, 2013. Epstein et al.EUR UROL 69:  428-35, 2016.      ASSESSMENT & PLAN: 84 yo male follows up for management of metastatic prostate cancer .  1. Prostate cancer ([place])   2. Prostate cancer metastatic to bone [facility])    # Prostate caner:  today's PSA has worsened. Reviewed with patient.  I wonder if this is a flare secondary to recent start of Zytiga. Patient is asymtomatic. Continue to monitor. Plan discussed with patient.  Continue Zytiga and prednisone. Patient appears tolerating well without significant side effects.    All questions were answered. The patient knows to call the clinic with any problems questions or concerns.  Return of visit: 4 weeks with repeat labs  Earlie Server, MD, PhD Hematology Oncology [facility] Pager- [phone] 11/02/2017

## 2017-11-02 NOTE — Progress Notes (Signed)
Patient here today for follow up.  Patient states no new concerns today  

## 2017-11-04 NOTE — Telephone Encounter (Signed)
Oral Chemotherapy Pharmacist Encounter   Medicare Part D Attestation Form has been received by [employer]. Mr. Matthew Hunter is now approved for Zytiga until 05/31/18.  Darl Pikes, PharmD, BCPS Hematology/Oncology Clinical Pharmacist [facility] [phone]  11/04/2017 3:12 PM

## 2017-11-09 NOTE — Progress Notes (Signed)
10:52 AM   Matthew Hunter [birth year] [id number]  Referring provider: Maryland Pink, MD [address] [facility] [place], [place] [address]  Chief Complaint  Patient presents with  . Prostate Cancer    HPI: Patient is an 84 year old African-American male with metastatic prostate cancer who presents today for a follow up.    Castrate resistant prostate cancer Contrast CT noted no evidence of metastatic disease.  Left adrenal nodule is indeterminate by relative washout characteristics but appears grossly stable in size from 02/28/2007, favoring a benign adenoma.  Left renal stone.  Aortic atherosclerosis.  Bone scan noted multiple abnormal foci of with of increased osseous tracer accumulation compatible with widespread osseous metastatic disease.  Patient has been seen and evaluated by the cancer center. He is to undergo a prostate biopsy for pathological confirmation for prostate cancer.  He underwent a biopsy on 03/10/2017 with Dr. Junious Silk. Pathology was positive for six-core Biopsy 03/10/2017 revealed a 35 gram prostate with Gleason 4+4=8, 2 cores, left base, left apex and Gleason 4+5=9, two cores, left mid and right mid. 4/6 cores positive.   His most recent PSA was 11.06 ng/mL in 10/2017.  Currently on Zytiga and Lupron injections.  He is feeling tired.  He is taking his calcium and Vitamin D.    BPH WITH LUTS  (prostate and/or bladder) IPSS score: 15/1  Previous score: 8/2  Previous PVR: 15 mL  Major complaint(s): nocturia and intermittency  x many years.  Denies any dysuria, hematuria or suprapubic pain.   Currently taking: tamsulosin 0.4 mg daily and Myrbetriq 25 mg daily.  Denies any recent fevers, chills, nausea or vomiting.  IPSS    Row Name 11/10/17 1000         International Prostate Symptom Score   How often have you had the sensation of not emptying your bladder?  Less than half the time     How often have you had to urinate less than every two hours?   Less than half the time     How often have you found you stopped and started again several times when you urinated?  About half the time     How often have you found it difficult to postpone urination?  Less than half the time     How often have you had a weak urinary stream?  About half the time     How often have you had to strain to start urination?  Less than 1 in 5 times     How many times did you typically get up at night to urinate?  2 Times     Total IPSS Score  15       Quality of Life due to urinary symptoms   If you were to spend the rest of your life with your urinary condition just the way it is now how would you feel about that?  Pleased        Score:  1-7 Mild 8-19 Moderate 20-35 Severe    PMH: Past Medical History:  Diagnosis Date  . Arthritis   . Benign enlargement of prostate   . CAD (coronary artery disease)   . Cancer ([place])   . Constipation   . Elevated PSA   . GERD (gastroesophageal reflux disease)   . Hypertension   . Incomplete bladder emptying   . Nocturia   . Prostate cancer ([place]) 05/03/2017  . Urgency of micturation   . Urinary frequency  Surgical History: Past Surgical History:  Procedure Laterality Date  . GALLBLADDER SURGERY    . PROSTATE SURGERY    . STOMACH SURGERY      Home Medications:  Allergies as of 11/10/2017   No Known Allergies     Medication List        Accurate as of 11/10/17 10:52 AM. Always use your most recent med list.          abiraterone acetate 250 MG tablet Commonly known as:  ZYTIGA Take 4 tablets (1,000 mg total) by mouth daily. Take on an empty stomach 1 hour before or 2 hours after a meal   aspirin EC 81 MG tablet Take 81 mg by mouth daily.   atenolol 100 MG tablet Commonly known as:  TENORMIN Take 100 mg by mouth daily.   CALCIUM-VITAMIN D PO Take 1 tablet by mouth 2 (two) times daily.   dutasteride 0.5 MG capsule Commonly known as:  AVODART Take 1 capsule (0.5 mg total) by mouth daily.     finasteride 5 MG tablet Commonly known as:  PROSCAR Take 1 tablet (5 mg total) by mouth daily.   hydrocortisone 2.5 % cream APP EXT AA BID   mirabegron ER 25 MG Tb24 tablet Commonly known as:  MYRBETRIQ Take 1 tablet (25 mg total) by mouth daily.   nortriptyline 25 MG capsule Commonly known as:  PAMELOR Take 25 mg by mouth at bedtime. Reported on 05/16/2015   nystatin 100000 UNIT/ML suspension Commonly known as:  MYCOSTATIN SWISH WITH 1 TEASPOONFUL BY MOUTH AND THEN SWALLOW. USE 4 TIMES A...  (REFER TO PRESCRIPTION NOTES).   omeprazole 20 MG capsule Commonly known as:  PRILOSEC Take 20 mg by mouth daily.   predniSONE 5 MG tablet Commonly known as:  DELTASONE Take 1 tablet (5 mg total) by mouth daily with breakfast.   pyridOXINE 50 MG tablet Commonly known as:  VITAMIN B-6 Take 50 mg by mouth daily.   tamsulosin 0.4 MG Caps capsule Commonly known as:  FLOMAX Take 1 capsule (0.4 mg total) by mouth daily.   Telmisartan-amLODIPine 80-5 MG Tabs Take 1 tablet by mouth daily.       Allergies: No Known Allergies  Family History: Family History  Problem Relation Age of Onset  . Hypertension Mother   . Stroke Father   . Kidney disease Neg Hx   . Prostate cancer Neg Hx   . Kidney cancer Neg Hx   . Bladder Cancer Neg Hx     Social History:  reports that he quit smoking about 19 years ago. He has never used smokeless tobacco. He reports that he has current or past drug history. Frequency: 2.00 times per week. He reports that he does not drink alcohol.  ROS: UROLOGY Frequent Urination?: No Hard to postpone urination?: No Burning/pain with urination?: No Get up at night to urinate?: Yes Leakage of urine?: No Urine stream starts and stops?: Yes Trouble starting stream?: No Do you have to strain to urinate?: No Blood in urine?: No Urinary tract infection?: No Sexually transmitted disease?: No Injury to kidneys or bladder?: No Painful intercourse?: No Weak  stream?: No Erection problems?: No Penile pain?: No Gastrointestinal Nausea?: No Vomiting?: No Indigestion/heartburn?: No Diarrhea?: No Constipation?: No Constitutional Fever: No Night sweats?: Yes Weight loss?: No Fatigue?: Yes Skin Skin rash/lesions?: No Itching?: Yes Eyes Blurred vision?: Yes Double vision?: No Ears/Nose/Throat Sore throat?: No Sinus problems?: No Hematologic/Lymphatic Swollen glands?: No Easy bruising?: No Cardiovascular Leg swelling?: No Chest  pain?: No Respiratory Cough?: No Shortness of breath?: No Endocrine Excessive thirst?: No Musculoskeletal Back pain?: Yes Joint pain?: Yes Neurological Headaches?: No Dizziness?: No Psychologic Depression?: No Anxiety?: No   Physical Exam: BP (!) 151/84 (BP Location: Right Arm, Patient Position: Sitting, Cuff Size: Normal)   Pulse (!) 57   Ht 5\' 9"  (1.753 m)   Wt 190 lb 14.4 oz (86.6 kg)   BMI 28.19 kg/m   Constitutional: Well nourished. Alert and oriented, No acute distress. HEENT: [place] AT, moist mucus membranes. Trachea midline, no masses. Cardiovascular: No clubbing, cyanosis, or edema. Respiratory: Normal respiratory effort, no increased work of breathing. Skin: No rashes, bruises or suspicious lesions. Lymph: No cervical or inguinal adenopathy. Neurologic: Grossly intact, no focal deficits, moving all 4 extremities. Psychiatric: Normal mood and affect.  Laboratory Data: PSA History:     2.66 ng/mL on 11/17/2011     4.54 ng/mL on 12/07/2013     3.3 ng/mL on 04/09/2014     4.3 ng/mL on 10/2014     6.3 ng/mL on 04/2015    7.1 ng/mL on 11/07/2015  10.4 ng/mL on 05/11/2016  7.8 ng/mL on 07/07/2016  86.8 ng/mL on 01/06/2017  64.78 ng/mL on 02/24/2017  Results for orders placed or performed in visit on 11/02/17  PSA  Result Value Ref Range   Prostatic Specific Antigen 11.06 (H) 0.00 - 4.00 ng/mL  Comprehensive metabolic panel  Result Value Ref Range   Sodium 141 135 - 145 mmol/L     Potassium 3.6 3.5 - 5.1 mmol/L   Chloride 109 101 - 111 mmol/L   CO2 24 22 - 32 mmol/L   Glucose, Bld 94 65 - 99 mg/dL   BUN 11 6 - 20 mg/dL   Creatinine, Ser 1.06 0.61 - 1.24 mg/dL   Calcium 9.5 8.9 - 10.3 mg/dL   Total Protein 7.4 6.5 - 8.1 g/dL   Albumin 3.9 3.5 - 5.0 g/dL   AST 27 15 - 41 U/L   ALT 14 (L) 17 - 63 U/L   Alkaline Phosphatase 61 38 - 126 U/L   Total Bilirubin 1.0 0.3 - 1.2 mg/dL   GFR calc non Af Amer >60 >60 mL/min   GFR calc Af Amer >60 >60 mL/min   Anion gap 8 5 - 15  CBC with Differential/Platelet  Result Value Ref Range   WBC 5.9 3.8 - 10.6 K/uL   RBC 4.32 (L) 4.40 - 5.90 MIL/uL   Hemoglobin 12.6 (L) 13.0 - 18.0 g/dL   HCT 37.1 (L) 40.0 - 52.0 %   MCV 85.8 80.0 - 100.0 fL   MCH 29.2 26.0 - 34.0 pg   MCHC 34.0 32.0 - 36.0 g/dL   RDW 14.5 11.5 - 14.5 %   Platelets 198 150 - 440 K/uL   Neutrophils Relative % 77 %   Neutro Abs 4.6 1.4 - 6.5 K/uL   Lymphocytes Relative 15 %   Lymphs Abs 0.9 (L) 1.0 - 3.6 K/uL   Monocytes Relative 7 %   Monocytes Absolute 0.4 0.2 - 1.0 K/uL   Eosinophils Relative 1 %   Eosinophils Absolute 0.1 0 - 0.7 K/uL   Basophils Relative 0 %   Basophils Absolute 0.0 0 - 0.1 K/uL   Lab Results  Component Value Date   WBC 5.9 11/02/2017   HGB 12.6 (L) 11/02/2017   HCT 37.1 (L) 11/02/2017   MCV 85.8 11/02/2017   PLT 198 11/02/2017    Lab Results  Component Value Date  CREATININE 1.06 11/02/2017   I have reviewed the labs  Pertinent imaging CLINICAL DATA:  Prostate cancer, staging.  EXAM: CT ABDOMEN AND PELVIS WITH CONTRAST  TECHNIQUE: Multidetector CT imaging of the abdomen and pelvis was performed using the standard protocol following bolus administration of intravenous contrast.  CONTRAST:  129mL ISOVUE-300 IOPAMIDOL (ISOVUE-300) INJECTION 61%  COMPARISON:  02/28/2007.  FINDINGS: Lower chest: Lung bases show no acute findings. Heart is mildly enlarged. No pericardial or pleural effusion distal  esophagus is air-filled.  Hepatobiliary: Subcentimeter low-attenuation lesions in the liver are too small to characterize. Cholecystectomy. No biliary ductal dilatation.  Pancreas: Negative.  Spleen: Negative.  Adrenals/Urinary Tract: Right adrenal gland is unremarkable. Left adrenal gland nodule measures 1.5 cm with 30% relative washout. Size is grossly stable from 02/28/2007. Right kidney is unremarkable. Low-attenuation lesions in the left kidney measure up to 2.0 cm and are likely cysts. A stone is seen in the lower pole left kidney. Ureters are decompressed. Bladder is grossly unremarkable.  Stomach/Bowel: Stomach, small bowel, appendix and colon are unremarkable. Likely fecal material in the rectum, rather than a true polyp.  Vascular/Lymphatic: Atherosclerotic calcification of the arterial vasculature without abdominal aortic aneurysm. No pathologically enlarged lymph nodes.  Reproductive: Prostate is visualized and appears mildly heterogeneous.  Other: No free fluid.  Mesenteries and peritoneum are unremarkable.  Musculoskeletal: No worrisome lytic or sclerotic lesions. Degenerative changes in the spine. Old left rib fracture.  IMPRESSION: 1. No evidence of metastatic disease. 2. Left adrenal nodule is indeterminate by relative washout characteristics but appears grossly stable in size from 02/28/2007, favoring a benign adenoma. 3. Left renal stone. 4.  Aortic atherosclerosis (ICD10-170.0).   Electronically Signed   By: Lorin Picket M.D.   On: 02/03/2017 13:12  CLINICAL DATA:  Prostate cancer, high risk treatment, low back pain  EXAM: NUCLEAR MEDICINE WHOLE BODY BONE SCAN  TECHNIQUE: Whole body anterior and posterior images were obtained approximately 3 hours after intravenous injection of radiopharmaceutical.  RADIOPHARMACEUTICALS:  22.96 mCi Technetium-50m MDP IV  COMPARISON:  None  Radiographic correlation:  CT abdomen and  pelvis 02/03/2017  FINDINGS: Numerous foci of abnormal osseous tracer accumulation are identified in a pattern most consistent with osseous metastatic disease.  These include anterior and posterior ribs bilaterally, proximal LEFT humeral metaphysis, RIGHT scapula, question LEFT scapula, RIGHT temporal bone, thoracic spine, proximal LEFT femur at the lesser trochanter and intertrochanteric region, in the pelvis, and questionably at the mid to distal LEFT femoral diaphysis.  Focus of abnormal tracer localization LEFT supraclavicular, probably soft tissue, nonspecific; this is opposite these site of probable injection, could represent abnormal lymph node, soft tissue calcification, less likely arising from the anterior aspect of the medial border of the LEFT scapula.  Otherwise expected urinary tract and soft tissue distribution of tracer.  IMPRESSION: Multiple abnormal foci of with of increased osseous tracer accumulation compatible with widespread osseous metastatic disease as above.   Electronically Signed   By: Lavonia Dana M.D.   On: 02/03/2017 14:55  Assessment & Plan:    1. Metastatic castrate resistant prostate cancer  Patient's PSA is 11.06 in 09/2017  Biopsy positive for Gleason's 9 - follow by cancer center currently on Zytiga Lupron given on 10/12/2017 at the cancer center  2. LUTS I PSS score is 15/1, it is worsening Continue tamsulosin 0.4 mg daily RTC in 6 months for I PSS    [name], PA-C  [place] [facility] [place], [place] [address] [phone]  [phone] 

## 2017-11-10 ENCOUNTER — Ambulatory Visit (INDEPENDENT_AMBULATORY_CARE_PROVIDER_SITE_OTHER): Payer: Medicare Other | Admitting: Urology

## 2017-11-10 ENCOUNTER — Encounter: Payer: Self-pay | Admitting: Urology

## 2017-11-10 VITALS — BP 151/84 | HR 57 | Ht 69.0 in | Wt 190.9 lb

## 2017-11-10 DIAGNOSIS — Z192 Hormone resistant malignancy status: Secondary | ICD-10-CM | POA: Diagnosis not present

## 2017-11-10 DIAGNOSIS — C61 Malignant neoplasm of prostate: Secondary | ICD-10-CM

## 2017-11-10 DIAGNOSIS — R399 Unspecified symptoms and signs involving the genitourinary system: Secondary | ICD-10-CM

## 2017-11-17 ENCOUNTER — Other Ambulatory Visit: Payer: Self-pay | Admitting: Oncology

## 2017-11-30 ENCOUNTER — Other Ambulatory Visit: Payer: Self-pay

## 2017-11-30 ENCOUNTER — Encounter: Payer: Self-pay | Admitting: Oncology

## 2017-11-30 ENCOUNTER — Inpatient Hospital Stay (HOSPITAL_BASED_OUTPATIENT_CLINIC_OR_DEPARTMENT_OTHER): Payer: Medicare Other | Admitting: Oncology

## 2017-11-30 ENCOUNTER — Inpatient Hospital Stay: Payer: Medicare Other | Attending: Oncology

## 2017-11-30 VITALS — BP 130/79 | HR 74 | Temp 97.1°F | Resp 18 | Wt 188.6 lb

## 2017-11-30 DIAGNOSIS — Z5111 Encounter for antineoplastic chemotherapy: Secondary | ICD-10-CM

## 2017-11-30 DIAGNOSIS — C7951 Secondary malignant neoplasm of bone: Secondary | ICD-10-CM

## 2017-11-30 DIAGNOSIS — I2510 Atherosclerotic heart disease of native coronary artery without angina pectoris: Secondary | ICD-10-CM | POA: Insufficient documentation

## 2017-11-30 DIAGNOSIS — R5383 Other fatigue: Secondary | ICD-10-CM | POA: Diagnosis not present

## 2017-11-30 DIAGNOSIS — Z87891 Personal history of nicotine dependence: Secondary | ICD-10-CM | POA: Insufficient documentation

## 2017-11-30 DIAGNOSIS — Z7982 Long term (current) use of aspirin: Secondary | ICD-10-CM | POA: Diagnosis not present

## 2017-11-30 DIAGNOSIS — N401 Enlarged prostate with lower urinary tract symptoms: Secondary | ICD-10-CM | POA: Insufficient documentation

## 2017-11-30 DIAGNOSIS — C61 Malignant neoplasm of prostate: Secondary | ICD-10-CM | POA: Insufficient documentation

## 2017-11-30 DIAGNOSIS — R351 Nocturia: Secondary | ICD-10-CM | POA: Diagnosis not present

## 2017-11-30 DIAGNOSIS — Z7952 Long term (current) use of systemic steroids: Secondary | ICD-10-CM | POA: Insufficient documentation

## 2017-11-30 DIAGNOSIS — M1990 Unspecified osteoarthritis, unspecified site: Secondary | ICD-10-CM | POA: Insufficient documentation

## 2017-11-30 DIAGNOSIS — M899 Disorder of bone, unspecified: Secondary | ICD-10-CM

## 2017-11-30 DIAGNOSIS — K219 Gastro-esophageal reflux disease without esophagitis: Secondary | ICD-10-CM | POA: Insufficient documentation

## 2017-11-30 DIAGNOSIS — I10 Essential (primary) hypertension: Secondary | ICD-10-CM | POA: Insufficient documentation

## 2017-11-30 DIAGNOSIS — Z79899 Other long term (current) drug therapy: Secondary | ICD-10-CM | POA: Diagnosis not present

## 2017-11-30 DIAGNOSIS — R972 Elevated prostate specific antigen [PSA]: Secondary | ICD-10-CM

## 2017-11-30 LAB — CBC WITH DIFFERENTIAL/PLATELET
BASOS ABS: 0 10*3/uL (ref 0–0.1)
Basophils Relative: 0 %
Eosinophils Absolute: 0.1 10*3/uL (ref 0–0.7)
Eosinophils Relative: 1 %
HEMATOCRIT: 39.1 % — AB (ref 40.0–52.0)
Hemoglobin: 13.3 g/dL (ref 13.0–18.0)
LYMPHS PCT: 13 %
Lymphs Abs: 0.9 10*3/uL — ABNORMAL LOW (ref 1.0–3.6)
MCH: 28.7 pg (ref 26.0–34.0)
MCHC: 33.9 g/dL (ref 32.0–36.0)
MCV: 84.7 fL (ref 80.0–100.0)
MONO ABS: 0.5 10*3/uL (ref 0.2–1.0)
Monocytes Relative: 7 %
NEUTROS ABS: 5.6 10*3/uL (ref 1.4–6.5)
Neutrophils Relative %: 79 %
Platelets: 190 10*3/uL (ref 150–440)
RBC: 4.62 MIL/uL (ref 4.40–5.90)
RDW: 15 % — AB (ref 11.5–14.5)
WBC: 7.2 10*3/uL (ref 3.8–10.6)

## 2017-11-30 LAB — COMPREHENSIVE METABOLIC PANEL
ALBUMIN: 4 g/dL (ref 3.5–5.0)
ALT: 14 U/L (ref 0–44)
AST: 23 U/L (ref 15–41)
Alkaline Phosphatase: 57 U/L (ref 38–126)
Anion gap: 6 (ref 5–15)
BUN: 12 mg/dL (ref 8–23)
CHLORIDE: 111 mmol/L (ref 98–111)
CO2: 24 mmol/L (ref 22–32)
Calcium: 9.7 mg/dL (ref 8.9–10.3)
Creatinine, Ser: 1.05 mg/dL (ref 0.61–1.24)
GFR calc Af Amer: >60 mL/min (ref >60)
GFR calc non Af Amer: >60 mL/min (ref >60)
GLUCOSE: 100 mg/dL — AB (ref 70–99)
POTASSIUM: 3.7 mmol/L (ref 3.5–5.1)
Sodium: 141 mmol/L (ref 135–145)
TOTAL PROTEIN: 7.4 g/dL (ref 6.5–8.1)
Total Bilirubin: 1.4 mg/dL — ABNORMAL HIGH (ref 0.3–1.2)

## 2017-11-30 LAB — PSA: PROSTATIC SPECIFIC ANTIGEN: 24.53 ng/mL — AB (ref 0.00–4.00)

## 2017-11-30 NOTE — Progress Notes (Signed)
Patient here for follow up. No concerns voiced.  °

## 2017-11-30 NOTE — Progress Notes (Signed)
Hematology/Oncology  Follow up note [facility] Telephone:([phone] [phone]  Patient Care Team: Maryland Pink, MD as PCP - General (Family Medicine)  REASON FOR VISIT Follow up for antineoplasm treatment of Prostate Cancer  HISTORY OF PRESENTING ILLNESS:  Matthew Hunter 84 y.o.  male with PMH listed as below who was referred by [facility] Urology Provider [name] to me for evaluation of clinical prostate cancer and management.  He has a history of elevated PSA and BPH. PSA trends as follows: 11/14/2014 4.3, 11/07/2015 7.1, 05/11/2016 10.4, 06/11/2016 10.6, 07/07/2016 7.8, 01/06/2017 86.8.  He was seen by urology and was diagnosed with clinical prostate cancer given the extreme elevation of PSA and prostate nodule. Biopsy was not pursued. He was treated with [name] on 01/29/2017,  # prostate biopsy on 03/10/2017. 5 out of 6 cores positive for prostate cancer, with highest gleason score group 9 (4+5), + perineural invasion.  Stage IV prostate cancer, multiple osseus metastatic disease. No visceral involvement.   INTERVAL HISTORY Patient presents to follow up on antineoplasm management of prostate cancer.  PSA 86.8 -->64.78 -->131--> 242 added Xtanti --> 17.74-->1.82 Xtandi stopped--> 1.79--> 11.06,[ likely flare from Abiaterone. 1 Memory loss resolved.  2 back pain, resolved. 3 fatigue, better.     Current treatment  Patient got first Firmagon on 01/29/2018, switched to leupron on 04/13/2015,  Q6 months.  .Testosterone level at 14, castration level.  Upfront Gillermina Phy was added on December 2019 to assist fast disease control (phase II trial [place] B, Lancet Oncol. 2014) S/p palliative RT to lumber spine and SI joints.     ROS:  Review of Systems  Constitutional: Negative for appetite change, chills, diaphoresis, fatigue, fever and unexpected weight change.  HENT:   Negative for hearing loss, lump/mass, mouth sores, nosebleeds and sore throat.     Eyes: Negative for eye problems and icterus.  Respiratory: Negative for chest tightness, cough, hemoptysis, shortness of breath and wheezing.   Cardiovascular: Negative for chest pain and leg swelling.  Gastrointestinal: Negative for abdominal distention, abdominal pain, blood in stool, diarrhea, nausea and rectal pain.  Endocrine: Negative for hot flashes.  Genitourinary: Positive for nocturia. Negative for bladder incontinence, difficulty urinating, dysuria, frequency and hematuria.   Musculoskeletal: Negative for back pain, flank pain, gait problem and myalgias.  Skin: Negative for itching and rash.  Neurological: Negative for dizziness, gait problem, headaches, numbness and seizures.  Hematological: Negative for adenopathy. Does not bruise/bleed easily.  Psychiatric/Behavioral: Negative for confusion, decreased concentration and depression. The patient is not nervous/anxious.     MEDICAL HISTORY:  Past Medical History:  Diagnosis Date  . Arthritis   . Benign enlargement of prostate   . CAD (coronary artery disease)   . Cancer ([place])   . Constipation   . Elevated PSA   . GERD (gastroesophageal reflux disease)   . Hypertension   . Incomplete bladder emptying   . Nocturia   . Prostate cancer ([place]) 05/03/2017  . Urgency of micturation   . Urinary frequency     SURGICAL HISTORY: Past Surgical History:  Procedure Laterality Date  . GALLBLADDER SURGERY    . PROSTATE SURGERY    . STOMACH SURGERY      SOCIAL HISTORY: Social History   Socioeconomic History  . Marital status: Married    Spouse name: Not on file  . Number of children: Not on file  . Years of education: Not on file  . Highest education level: Not on file  Occupational History  .  Not on file  Social Needs  . Financial resource strain: Not on file  . Food insecurity:    Worry: Not on file    Inability: Not on file  . Transportation needs:    Medical: Not on file    Non-medical: Not on file  Tobacco Use   . Smoking status: Former Smoker    Last attempt to quit: 06/01/1998    Years since quitting: 19.5  . Smokeless tobacco: Never Used  Substance and Sexual Activity  . Alcohol use: No    Alcohol/week: 0.0 oz  . Drug use: Yes    Frequency: 2.0 times per week    Comment: marijuana two time monthly gives him an appetite  . Sexual activity: Not on file  Lifestyle  . Physical activity:    Days per week: Not on file    Minutes per session: Not on file  . Stress: Not on file  Relationships  . Social connections:    Talks on phone: Not on file    Gets together: Not on file    Attends religious service: Not on file    Active member of club or organization: Not on file    Attends meetings of clubs or organizations: Not on file    Relationship status: Not on file  . Intimate partner violence:    Fear of current or ex partner: Not on file    Emotionally abused: Not on file    Physically abused: Not on file    Forced sexual activity: Not on file  Other Topics Concern  . Not on file  Social History Narrative  . Not on file    FAMILY HISTORY: Family History  Problem Relation Age of Onset  . Hypertension Mother   . Stroke Father   . Kidney disease Neg Hx   . Prostate cancer Neg Hx   . Kidney cancer Neg Hx   . Bladder Cancer Neg Hx     ALLERGIES:  has No Known Allergies.  MEDICATIONS:  Current Outpatient Medications  Medication Sig Dispense Refill  . abiraterone acetate (ZYTIGA) 250 MG tablet Take 4 tablets (1,000 mg total) by mouth daily. Take on an empty stomach 1 hour before or 2 hours after a meal 120 tablet 0  . aspirin EC 81 MG tablet Take 81 mg by mouth daily.     Marland Kitchen atenolol (TENORMIN) 100 MG tablet Take 100 mg by mouth daily.     Marland Kitchen CALCIUM-VITAMIN D PO Take 1 tablet by mouth 2 (two) times daily.     Marland Kitchen dutasteride (AVODART) 0.5 MG capsule Take 1 capsule (0.5 mg total) by mouth daily. 30 capsule 0  . finasteride (PROSCAR) 5 MG tablet Take 1 tablet (5 mg total) by mouth  daily. 90 tablet 3  . hydrocortisone 2.5 % cream APP EXT AA BID  0  . mirabegron ER (MYRBETRIQ) 25 MG TB24 tablet Take 1 tablet (25 mg total) by mouth daily. 90 tablet 3  . nortriptyline (PAMELOR) 25 MG capsule Take 25 mg by mouth at bedtime. Reported on 05/16/2015  1  . nystatin (MYCOSTATIN) 100000 UNIT/ML suspension SWISH WITH 1 TEASPOONFUL BY MOUTH AND THEN SWALLOW. USE 4 TIMES A...  (REFER TO PRESCRIPTION NOTES).  0  . omeprazole (PRILOSEC) 20 MG capsule Take 20 mg by mouth daily.    . predniSONE (DELTASONE) 5 MG tablet TAKE 1 TABLET(5 MG) BY MOUTH DAILY WITH BREAKFAST 30 tablet 0  . pyridOXINE (VITAMIN B-6) 50 MG tablet Take 50 mg  by mouth daily.     . tamsulosin (FLOMAX) 0.4 MG CAPS capsule Take 1 capsule (0.4 mg total) by mouth daily. 90 capsule 3  . Telmisartan-Amlodipine 80-5 MG TABS Take 1 tablet by mouth daily.      No current facility-administered medications for this visit.       Marland Kitchen  PHYSICAL EXAMINATION: ECOG PERFORMANCE STATUS: 1 - Symptomatic but completely ambulatory Vitals:   11/30/17 1059  BP: 130/79  Pulse: 74  Resp: 18  Temp: (!) 97.1 F (36.2 C)   Filed Weights   11/30/17 1059  Weight: 188 lb 9.6 oz (85.5 kg)   Physical Exam  Constitutional: He is oriented to person, place, and time. He appears well-developed and well-nourished. No distress.  HENT:  Head: Normocephalic and atraumatic.  Right Ear: External ear normal.  Left Ear: External ear normal.  Mouth/Throat: Oropharynx is clear and moist. No oropharyngeal exudate.  Eyes: Pupils are equal, round, and reactive to light. Conjunctivae and EOM are normal. No scleral icterus.  Neck: Normal range of motion. Neck supple. No JVD present. No thyromegaly present.  Cardiovascular: Normal rate, regular rhythm and normal heart sounds.  No murmur heard. Pulmonary/Chest: Effort normal and breath sounds normal. No stridor. No respiratory distress. He has no wheezes. He has no rales. He exhibits no tenderness.   Abdominal: Soft. Bowel sounds are normal. He exhibits no distension and no mass. There is no tenderness. There is no guarding.  Musculoskeletal: Normal range of motion. He exhibits no edema or deformity.  Lymphadenopathy:    He has no cervical adenopathy.  Neurological: He is alert and oriented to person, place, and time. He displays normal reflexes. No cranial nerve deficit. Coordination normal.  Skin: Skin is warm and dry. No rash noted. He is not diaphoretic. No erythema.  Psychiatric: He has a normal mood and affect. His behavior is normal. Thought content normal.   LABORATORY DATA:  I have reviewed the data as listed Lab Results  Component Value Date   WBC 7.2 11/30/2017   HGB 13.3 11/30/2017   HCT 39.1 (L) 11/30/2017   MCV 84.7 11/30/2017   PLT 190 11/30/2017   Recent Labs    10/05/17 0929 11/02/17 0935 11/30/17 1034  NA 139 141 141  K 3.9 3.6 3.7  CL 107 109 111  CO2 24 24 24   GLUCOSE 98 94 100*  BUN 12 11 12   CREATININE 1.08 1.06 1.05  CALCIUM 9.4 9.5 9.7  GFRNONAA >60 >60 >60  GFRAA >60 >60 >60  PROT 7.3 7.4 7.4  ALBUMIN 3.8 3.9 4.0  AST 25 27 23   ALT 13* 14* 14  ALKPHOS 57 61 57  BILITOT 0.9 1.0 1.4*     ASSESSMENT & PLAN: 84 yo male follows up for management of metastatic prostate cancer .  1. Prostate cancer metastatic to bone ([place])   2. Encounter for antineoplastic chemotherapy    # Prostate caner:  last PSA worsened,Today's PSA further trends up.  Patient is asymptomatic.  Will repeat Testosterone level.   testosterone level done, within castration level. Patient is likely developing castration resistance.  Repeat Bone scan.  Discontinue Abiaterone and prednisone.   All questions were answered. The patient knows to call the clinic with any problems questions or concerns. Total face to face encounter time for this patient visit was 25 min. >50% of the time was  spent in counseling and coordination of care.  Return of visit: follow up after bone  scan to discuss results.  Earlie Server, MD, PhD Hematology Oncology [facility] Pager- [phone] 11/30/2017

## 2017-12-03 ENCOUNTER — Other Ambulatory Visit: Payer: Self-pay | Admitting: *Deleted

## 2017-12-03 DIAGNOSIS — C61 Malignant neoplasm of prostate: Secondary | ICD-10-CM

## 2017-12-07 ENCOUNTER — Other Ambulatory Visit: Payer: Self-pay

## 2017-12-07 ENCOUNTER — Inpatient Hospital Stay: Payer: Medicare Other

## 2017-12-07 DIAGNOSIS — C61 Malignant neoplasm of prostate: Secondary | ICD-10-CM | POA: Diagnosis not present

## 2017-12-08 LAB — TESTOSTERONE: Testosterone: <3 ng/dL — ABNORMAL LOW (ref 264–916)

## 2017-12-09 ENCOUNTER — Telehealth: Payer: Self-pay | Admitting: Oncology

## 2017-12-09 NOTE — Addendum Note (Signed)
Addended by: Earlie Server on: 12/09/2017 09:54 AM   Modules accepted: Orders

## 2017-12-09 NOTE — Telephone Encounter (Signed)
Called patient at 1200 to inform him about stopping Abiaerone and prednisone and to tell him about scheduling a bone scan . Patient acknowledged understanding.

## 2017-12-14 ENCOUNTER — Other Ambulatory Visit: Payer: Self-pay | Admitting: Oncology

## 2017-12-14 DIAGNOSIS — C61 Malignant neoplasm of prostate: Secondary | ICD-10-CM

## 2017-12-15 ENCOUNTER — Encounter
Admission: RE | Admit: 2017-12-15 | Discharge: 2017-12-15 | Disposition: A | Discharge status: Home / Self Care | Payer: Medicare Other | Source: Ambulatory Visit | Attending: Oncology | Admitting: Oncology

## 2017-12-15 DIAGNOSIS — C7951 Secondary malignant neoplasm of bone: Secondary | ICD-10-CM | POA: Insufficient documentation

## 2017-12-15 DIAGNOSIS — C61 Malignant neoplasm of prostate: Secondary | ICD-10-CM | POA: Diagnosis present

## 2017-12-15 DIAGNOSIS — Z5111 Encounter for antineoplastic chemotherapy: Secondary | ICD-10-CM | POA: Insufficient documentation

## 2017-12-15 MED ORDER — TECHNETIUM TC 99M MEDRONATE IV KIT
23.0900 | PACK | Freq: Once | INTRAVENOUS | Status: AC | PRN
  Administered 2017-12-15: 23.09 via INTRAVENOUS

## 2017-12-16 ENCOUNTER — Inpatient Hospital Stay (HOSPITAL_BASED_OUTPATIENT_CLINIC_OR_DEPARTMENT_OTHER): Payer: Medicare Other | Admitting: Oncology

## 2017-12-16 ENCOUNTER — Encounter: Payer: Self-pay | Admitting: Oncology

## 2017-12-16 ENCOUNTER — Inpatient Hospital Stay: Payer: Medicare Other

## 2017-12-16 ENCOUNTER — Telehealth: Payer: Self-pay | Admitting: Pharmacist

## 2017-12-16 ENCOUNTER — Other Ambulatory Visit: Payer: Self-pay

## 2017-12-16 ENCOUNTER — Telehealth: Payer: Self-pay | Admitting: Pharmacy Technician

## 2017-12-16 VITALS — BP 134/78 | HR 59 | Temp 96.0°F | Resp 18 | Wt 190.7 lb

## 2017-12-16 DIAGNOSIS — N401 Enlarged prostate with lower urinary tract symptoms: Secondary | ICD-10-CM | POA: Diagnosis not present

## 2017-12-16 DIAGNOSIS — R351 Nocturia: Secondary | ICD-10-CM | POA: Diagnosis not present

## 2017-12-16 DIAGNOSIS — C7951 Secondary malignant neoplasm of bone: Secondary | ICD-10-CM

## 2017-12-16 DIAGNOSIS — K219 Gastro-esophageal reflux disease without esophagitis: Secondary | ICD-10-CM

## 2017-12-16 DIAGNOSIS — Z7189 Other specified counseling: Secondary | ICD-10-CM

## 2017-12-16 DIAGNOSIS — M1990 Unspecified osteoarthritis, unspecified site: Secondary | ICD-10-CM

## 2017-12-16 DIAGNOSIS — I10 Essential (primary) hypertension: Secondary | ICD-10-CM

## 2017-12-16 DIAGNOSIS — I2510 Atherosclerotic heart disease of native coronary artery without angina pectoris: Secondary | ICD-10-CM

## 2017-12-16 DIAGNOSIS — Z87891 Personal history of nicotine dependence: Secondary | ICD-10-CM

## 2017-12-16 DIAGNOSIS — Z7952 Long term (current) use of systemic steroids: Secondary | ICD-10-CM

## 2017-12-16 DIAGNOSIS — C61 Malignant neoplasm of prostate: Secondary | ICD-10-CM

## 2017-12-16 DIAGNOSIS — R5383 Other fatigue: Secondary | ICD-10-CM

## 2017-12-16 DIAGNOSIS — Z79899 Other long term (current) drug therapy: Secondary | ICD-10-CM

## 2017-12-16 DIAGNOSIS — Z7982 Long term (current) use of aspirin: Secondary | ICD-10-CM

## 2017-12-16 LAB — COMPREHENSIVE METABOLIC PANEL
ALBUMIN: 3.9 g/dL (ref 3.5–5.0)
ALK PHOS: 50 U/L (ref 38–126)
ALT: 14 U/L (ref 0–44)
ANION GAP: 8 (ref 5–15)
AST: 25 U/L (ref 15–41)
BUN: 10 mg/dL (ref 8–23)
CALCIUM: 9.5 mg/dL (ref 8.9–10.3)
CO2: 22 mmol/L (ref 22–32)
Chloride: 110 mmol/L (ref 98–111)
Creatinine, Ser: 1 mg/dL (ref 0.61–1.24)
GFR calc Af Amer: >60 mL/min (ref >60)
GFR calc non Af Amer: >60 mL/min (ref >60)
GLUCOSE: 101 mg/dL — AB (ref 70–99)
Potassium: 4 mmol/L (ref 3.5–5.1)
SODIUM: 140 mmol/L (ref 135–145)
Total Bilirubin: 1.1 mg/dL (ref 0.3–1.2)
Total Protein: 7.3 g/dL (ref 6.5–8.1)

## 2017-12-16 LAB — CBC WITH DIFFERENTIAL/PLATELET
BASOS ABS: 0 10*3/uL (ref 0–0.1)
BASOS PCT: 0 %
Eosinophils Absolute: 0.1 10*3/uL (ref 0–0.7)
Eosinophils Relative: 1 %
HEMATOCRIT: 38 % — AB (ref 40.0–52.0)
HEMOGLOBIN: 13 g/dL (ref 13.0–18.0)
Lymphocytes Relative: 21 %
Lymphs Abs: 1.3 10*3/uL (ref 1.0–3.6)
MCH: 28.8 pg (ref 26.0–34.0)
MCHC: 34.2 g/dL (ref 32.0–36.0)
MCV: 84.4 fL (ref 80.0–100.0)
MONOS PCT: 11 %
Monocytes Absolute: 0.7 10*3/uL (ref 0.2–1.0)
NEUTROS ABS: 4.3 10*3/uL (ref 1.4–6.5)
NEUTROS PCT: 67 %
Platelets: 169 10*3/uL (ref 150–440)
RBC: 4.5 MIL/uL (ref 4.40–5.90)
RDW: 14.6 % — ABNORMAL HIGH (ref 11.5–14.5)
WBC: 6.4 10*3/uL (ref 3.8–10.6)

## 2017-12-16 LAB — PSA: Prostatic Specific Antigen: 39.48 ng/mL — ABNORMAL HIGH (ref 0.00–4.00)

## 2017-12-16 MED ORDER — APALUTAMIDE 60 MG PO TABS: 240.0000 mg | ORAL_TABLET | Freq: Every day | ORAL | 2 refills | Status: DC

## 2017-12-16 NOTE — Progress Notes (Signed)
Patient here for follow up. No concerns voiced.  °

## 2017-12-16 NOTE — Telephone Encounter (Signed)
Oral Oncology Patient Advocate Encounter  Received notification from [employer] Part D that prior authorization for [place] is required.  PA submitted on CoverMyMeds Key [id number] Status is pending  Oral Oncology Clinic will continue to follow.  [place] Patient [place] Phone [phone] Fax [phone] 12/16/2017 3:30 PM

## 2017-12-16 NOTE — Progress Notes (Signed)
Hematology/Oncology  Follow up note [facility] Telephone:([phone] [phone]  Patient Care Team: [place] Pink, MD as PCP - General (Family Medicine)  REASON FOR VISIT Follow up for antineoplasm treatment of Prostate Cancer  HISTORY OF PRESENTING ILLNESS:  Matthew Hunter 84 y.o.  male with PMH listed as below who was referred by [facility] Urology Provider [name] to me for evaluation of clinical prostate cancer and management.  He has a history of elevated PSA and BPH. PSA trends as follows: 11/14/2014 4.3, 11/07/2015 7.1, 05/11/2016 10.4, 06/11/2016 10.6, 07/07/2016 7.8, 01/06/2017 86.8.  He was seen by urology and was diagnosed with clinical prostate cancer given the extreme elevation of PSA and prostate nodule. Biopsy was not pursued. He was treated with [name] on 01/29/2017,  # prostate biopsy on 03/10/2017. 5 out of 6 cores positive for prostate cancer, with highest gleason score group 9 (4+5), + perineural invasion.  Stage IV prostate cancer, multiple osseus metastatic disease. No visceral involvement.   Current treatment  Patient got first Firmagon on 01/29/2018, switched to leupron on 04/13/2015,  Q6 months.  .Testosterone level at 14, castration level.  Upfront Gillermina Phy was added on December 2019 to assist fast disease control (phase II trial Tombal B, Lancet Oncol. 2014) S/p palliative RT to lumber spine and SI joints. Xtandi discontinued on due to CNS toxicity in April 2019.  12/09/17 Abiaterone discontinued.   INTERVAL HISTORY Patient presents to follow up on antineoplasm management of prostate cancer.  PSA 86.8 -->64.78 -->131--> 242 added Xtanti --> 17.74-->1.82 Xtandi stopped--> 1.79- (added Abiaterone)-> 11.06-->24.53--> 39.48. PSA continues to rise. Bone scan was done showed new bone lesion distal left femoral diaphysis, L2, right iliac bone and right ischium. Patient currently not symptomatic.   ROS:  Review of Systems    Constitutional: Negative for appetite change, chills, diaphoresis, fatigue, fever and unexpected weight change.  HENT:   Negative for hearing loss, lump/mass, mouth sores, nosebleeds and sore throat.   Eyes: Negative for eye problems and icterus.  Respiratory: Negative for chest tightness, cough, hemoptysis, shortness of breath and wheezing.   Cardiovascular: Negative for chest pain and leg swelling.  Gastrointestinal: Negative for abdominal distention, abdominal pain, blood in stool, diarrhea, nausea and rectal pain.  Endocrine: Negative for hot flashes.  Genitourinary: Positive for nocturia. Negative for bladder incontinence, difficulty urinating, dysuria, frequency and hematuria.   Musculoskeletal: Negative for back pain, flank pain, gait problem and myalgias.  Skin: Negative for itching and rash.  Neurological: Negative for dizziness, gait problem, headaches, numbness and seizures.  Hematological: Negative for adenopathy. Does not bruise/bleed easily.  Psychiatric/Behavioral: Negative for confusion, decreased concentration and depression. The patient is not nervous/anxious.     MEDICAL HISTORY:  Past Medical History:  Diagnosis Date  . Arthritis   . Benign enlargement of prostate   . CAD (coronary artery disease)   . Cancer ([place])   . Constipation   . Elevated PSA   . GERD (gastroesophageal reflux disease)   . Hypertension   . Incomplete bladder emptying   . Nocturia   . Prostate cancer ([place]) 05/03/2017  . Urgency of micturation   . Urinary frequency     SURGICAL HISTORY: Past Surgical History:  Procedure Laterality Date  . GALLBLADDER SURGERY    . PROSTATE SURGERY    . STOMACH SURGERY      SOCIAL HISTORY: Social History   Socioeconomic History  . Marital status: Married    Spouse name: Not on file  . Number  of children: Not on file  . Years of education: Not on file  . Highest education level: Not on file  Occupational History  . Not on file  Social Needs  .  Financial resource strain: Not on file  . Food insecurity:    Worry: Not on file    Inability: Not on file  . Transportation needs:    Medical: Not on file    Non-medical: Not on file  Tobacco Use  . Smoking status: Former Smoker    Last attempt to quit: 06/01/1998    Years since quitting: 19.5  . Smokeless tobacco: Never Used  Substance and Sexual Activity  . Alcohol use: No    Alcohol/week: 0.0 oz  . Drug use: Yes    Frequency: 2.0 times per week    Comment: marijuana two time monthly gives him an appetite  . Sexual activity: Not on file  Lifestyle  . Physical activity:    Days per week: Not on file    Minutes per session: Not on file  . Stress: Not on file  Relationships  . Social connections:    Talks on phone: Not on file    Gets together: Not on file    Attends religious service: Not on file    Active member of club or organization: Not on file    Attends meetings of clubs or organizations: Not on file    Relationship status: Not on file  . Intimate partner violence:    Fear of current or ex partner: Not on file    Emotionally abused: Not on file    Physically abused: Not on file    Forced sexual activity: Not on file  Other Topics Concern  . Not on file  Social History Narrative  . Not on file    FAMILY HISTORY: Family History  Problem Relation Age of Onset  . Hypertension Mother   . Stroke Father   . Kidney disease Neg Hx   . Prostate cancer Neg Hx   . Kidney cancer Neg Hx   . Bladder Cancer Neg Hx     ALLERGIES:  has No Known Allergies.  MEDICATIONS:  Current Outpatient Medications  Medication Sig Dispense Refill  . aspirin EC 81 MG tablet Take 81 mg by mouth daily.     Marland Kitchen atenolol (TENORMIN) 100 MG tablet Take 100 mg by mouth daily.     Marland Kitchen CALCIUM-VITAMIN D PO Take 1 tablet by mouth 2 (two) times daily.     Marland Kitchen dutasteride (AVODART) 0.5 MG capsule Take 1 capsule (0.5 mg total) by mouth daily. 30 capsule 0  . finasteride (PROSCAR) 5 MG tablet Take 1  tablet (5 mg total) by mouth daily. 90 tablet 3  . hydrocortisone 2.5 % cream APP EXT AA BID  0  . mirabegron ER (MYRBETRIQ) 25 MG TB24 tablet Take 1 tablet (25 mg total) by mouth daily. 90 tablet 3  . nortriptyline (PAMELOR) 25 MG capsule Take 25 mg by mouth at bedtime. Reported on 05/16/2015  1  . omeprazole (PRILOSEC) 20 MG capsule Take 20 mg by mouth daily.    Marland Kitchen pyridOXINE (VITAMIN B-6) 50 MG tablet Take 50 mg by mouth daily.     . tamsulosin (FLOMAX) 0.4 MG CAPS capsule Take 1 capsule (0.4 mg total) by mouth daily. 90 capsule 3  . Telmisartan-Amlodipine 80-5 MG TABS Take 1 tablet by mouth daily.     Marland Kitchen abiraterone acetate (ZYTIGA) 250 MG tablet Take 4 tablets (1,000 mg  total) by mouth daily. Take on an empty stomach 1 hour before or 2 hours after a meal (Patient not taking: Reported on 12/16/2017) 120 tablet 0  . nystatin (MYCOSTATIN) 100000 UNIT/ML suspension SWISH WITH 1 TEASPOONFUL BY MOUTH AND THEN SWALLOW. USE 4 TIMES A...  (REFER TO PRESCRIPTION NOTES).  0  . predniSONE (DELTASONE) 5 MG tablet TAKE 1 TABLET(5 MG) BY MOUTH DAILY WITH BREAKFAST (Patient not taking: Reported on 12/16/2017) 30 tablet 0   No current facility-administered medications for this visit.       Marland Kitchen  PHYSICAL EXAMINATION: ECOG PERFORMANCE STATUS: 1 - Symptomatic but completely ambulatory Vitals:   12/16/17 1349  BP: 134/78  Pulse: (!) 59  Resp: 18  Temp: (!) 96 F (35.6 C)   Filed Weights   12/16/17 1349  Weight: 190 lb 11.2 oz (86.5 kg)   Physical Exam  Constitutional: He is oriented to person, place, and time. He appears well-developed and well-nourished. No distress.  HENT:  Head: Normocephalic and atraumatic.  Right Ear: External ear normal.  Left Ear: External ear normal.  Mouth/Throat: Oropharynx is clear and moist. No oropharyngeal exudate.  Eyes: Pupils are equal, round, and reactive to light. Conjunctivae and EOM are normal. No scleral icterus.  Neck: Normal range of motion. Neck supple. No  JVD present. No thyromegaly present.  Cardiovascular: Normal rate, regular rhythm and normal heart sounds.  No murmur heard. Pulmonary/Chest: Effort normal and breath sounds normal. No stridor. No respiratory distress. He has no wheezes. He has no rales. He exhibits no tenderness.  Abdominal: Soft. Bowel sounds are normal. He exhibits no distension and no mass. There is no tenderness. There is no guarding.  Musculoskeletal: Normal range of motion. He exhibits no edema or deformity.  Lymphadenopathy:    He has no cervical adenopathy.  Neurological: He is alert and oriented to person, place, and time. He displays normal reflexes. No cranial nerve deficit. Coordination normal.  Skin: Skin is warm and dry. No rash noted. He is not diaphoretic. No erythema.  Psychiatric: He has a normal mood and affect. His behavior is normal. Thought content normal.   LABORATORY DATA:  I have reviewed the data as listed Lab Results  Component Value Date   WBC 6.4 12/16/2017   HGB 13.0 12/16/2017   HCT 38.0 (L) 12/16/2017   MCV 84.4 12/16/2017   PLT 169 12/16/2017   Recent Labs    10/05/17 0929 11/02/17 0935 11/30/17 1034  NA 139 141 141  K 3.9 3.6 3.7  CL 107 109 111  CO2 24 24 24   GLUCOSE 98 94 100*  BUN 12 11 12   CREATININE 1.08 1.06 1.05  CALCIUM 9.4 9.5 9.7  GFRNONAA >60 >60 >60  GFRAA >60 >60 >60  PROT 7.3 7.4 7.4  ALBUMIN 3.8 3.9 4.0  AST 25 27 23   ALT 13* 14* 14  ALKPHOS 57 61 57  BILITOT 0.9 1.0 1.4*     ASSESSMENT & PLAN: 84 yo male follows up for management of metastatic prostate cancer .  1. Prostate cancer ([place])   2. Prostate cancer metastatic to bone [facility])    # Prostate cancer, progression, now castration resistance.  Discussed with patient that prostate cancer progressed. He has tried [place], however did not tolerate. Progressed on Abiaterone.  Likely not Docetaxel candidate.  Recommend off label use of Apalutamide. Patient understands his disease is not curable.  Treatment is with palliative intent. Rationale and Side effects discussed with patient and he agrees with the  plan.  Bone lesion: start Xgeva monthly. .   All questions were answered. The patient knows to call the clinic with any problems questions or concerns. Total face to face encounter time for this patient visit was 61min. >50% of the time was  spent in counseling and coordination of care.   Return of visit: 2 weeks.    Earlie Server, MD, PhD Hematology Oncology [facility] at [facility] Pager- [phone] 12/16/2017

## 2017-12-16 NOTE — Telephone Encounter (Signed)
Oral Oncology Pharmacist Encounter  Received new prescription for Erleada (apalutamide) for the treatment of metastatic castrate resistance prostate cancer, planned duration until disease progression or unacceptable drug toxicity. Patient has not tolerated Xtandi due to CNS side effects and Zytiga was discontinued due to worsening PSA.   BP at today's visit was well controlled. Prescription dose and frequency assessed.   Current medication list in Epic reviewed, a few DDIs with Erleada identified: - Erleada may decrease the concentrations of omeprazole, tamsulosin, and amlodipine. Mr. Gonyea should be monitored for decreased effectiveness of the above listed medications. No dose alterations are needed at this time.   Prescription has been e-scribed to the [facility] for benefits analysis and approval.  Oral Oncology Clinic will continue to follow for insurance authorization, copayment issues, and start date.   Patient Education I spoke with patient and his wife for overview of new oral chemotherapy medication: Erleada   Counseled patient on administration, dosing, side effects, monitoring, drug-food interactions, safe handling, storage, and disposal. Patient will take 4 tablets (240 mg total) by mouth daily. May be taken with or without food.  Patient reminded to stop his Zytiga and prednisone.  Side effects include but not limited to: fatigue, decrease wbc/hgb, hypertension.    Reviewed with patient importance of keeping a medication schedule and plan for any missed doses.  Mr. Hayter voiced understanding and appreciation. All questions answered. Medication handout provided and consent obtained.  Provided patient with [facility] phone number. Patient knows to call the office with questions or concerns. Oral Chemotherapy Navigation Clinic will continue to follow.  Darl Pikes, PharmD, BCPS, [facility] Hematology/Oncology Clinical  Pharmacist [facility] [phone]  12/16/2017 2:50 PM

## 2017-12-16 NOTE — Telephone Encounter (Signed)
Oral Chemotherapy Pharmacist Encounter  Dispensed samples to patient:  Medication: Erleada Instructions: Take 4 tablets (240 mg total) by mouth daily. May be taken with or without food. Quantity dispensed: 120 Days supply: 30 Manufacturer: Manson Passey: [id number] Exp: 04/2018  Darl Pikes, PharmD, BCPS, BCOP Hematology/Oncology Clinical Pharmacist [facility] [phone]  12/16/2017 3:24 PM

## 2017-12-17 DIAGNOSIS — Z7189 Other specified counseling: Secondary | ICD-10-CM | POA: Insufficient documentation

## 2017-12-17 NOTE — Telephone Encounter (Signed)
Oral Oncology Patient Advocate Encounter  Prior Authorization for [facility] has been approved.    PA# [id number] Effective dates: 12/17/17 through 05/31/18  Oral Oncology Clinic will continue to follow.   Matthew Hunter Phone [phone] Fax [phone] 12/17/2017 11:30 AM

## 2017-12-20 MED FILL — ERLEADA 60 MG TABS: 60 | 30 days supply | Qty: 120 | Fill #0

## 2017-12-29 ENCOUNTER — Other Ambulatory Visit: Payer: Self-pay | Admitting: Oncology

## 2017-12-29 DIAGNOSIS — C61 Malignant neoplasm of prostate: Secondary | ICD-10-CM

## 2017-12-30 ENCOUNTER — Inpatient Hospital Stay (HOSPITAL_BASED_OUTPATIENT_CLINIC_OR_DEPARTMENT_OTHER): Payer: Medicare Other | Admitting: Oncology

## 2017-12-30 ENCOUNTER — Inpatient Hospital Stay: Payer: Medicare Other

## 2017-12-30 ENCOUNTER — Other Ambulatory Visit: Payer: Self-pay

## 2017-12-30 ENCOUNTER — Inpatient Hospital Stay: Payer: Medicare Other | Attending: Oncology

## 2017-12-30 ENCOUNTER — Encounter: Payer: Self-pay | Admitting: Oncology

## 2017-12-30 VITALS — BP 145/82 | HR 82 | Temp 96.9°F | Resp 18 | Wt 187.8 lb

## 2017-12-30 DIAGNOSIS — R42 Dizziness and giddiness: Secondary | ICD-10-CM | POA: Diagnosis not present

## 2017-12-30 DIAGNOSIS — R5383 Other fatigue: Secondary | ICD-10-CM | POA: Diagnosis not present

## 2017-12-30 DIAGNOSIS — R9721 Rising PSA following treatment for malignant neoplasm of prostate: Secondary | ICD-10-CM | POA: Diagnosis not present

## 2017-12-30 DIAGNOSIS — C61 Malignant neoplasm of prostate: Secondary | ICD-10-CM

## 2017-12-30 DIAGNOSIS — R5381 Other malaise: Secondary | ICD-10-CM | POA: Insufficient documentation

## 2017-12-30 DIAGNOSIS — N179 Acute kidney failure, unspecified: Secondary | ICD-10-CM

## 2017-12-30 DIAGNOSIS — R972 Elevated prostate specific antigen [PSA]: Secondary | ICD-10-CM

## 2017-12-30 DIAGNOSIS — M899 Disorder of bone, unspecified: Secondary | ICD-10-CM | POA: Diagnosis not present

## 2017-12-30 DIAGNOSIS — G893 Neoplasm related pain (acute) (chronic): Secondary | ICD-10-CM | POA: Diagnosis not present

## 2017-12-30 DIAGNOSIS — C7951 Secondary malignant neoplasm of bone: Secondary | ICD-10-CM | POA: Insufficient documentation

## 2017-12-30 DIAGNOSIS — A0472 Enterocolitis due to Clostridium difficile, not specified as recurrent: Secondary | ICD-10-CM | POA: Diagnosis not present

## 2017-12-30 DIAGNOSIS — F5089 Other specified eating disorder: Secondary | ICD-10-CM | POA: Insufficient documentation

## 2017-12-30 LAB — COMPREHENSIVE METABOLIC PANEL
ALBUMIN: 4.1 g/dL (ref 3.5–5.0)
ALK PHOS: 51 U/L (ref 38–126)
ALT: 16 U/L (ref 0–44)
AST: 33 U/L (ref 15–41)
Anion gap: 11 (ref 5–15)
BUN: 13 mg/dL (ref 8–23)
CO2: 21 mmol/L — AB (ref 22–32)
CREATININE: 1.38 mg/dL — AB (ref 0.61–1.24)
Calcium: 9.8 mg/dL (ref 8.9–10.3)
Chloride: 110 mmol/L (ref 98–111)
GFR calc non Af Amer: 45 mL/min — ABNORMAL LOW (ref >60)
GFR, EST AFRICAN AMERICAN: 52 mL/min — AB (ref >60)
GLUCOSE: 145 mg/dL — AB (ref 70–99)
Potassium: 3.9 mmol/L (ref 3.5–5.1)
SODIUM: 142 mmol/L (ref 135–145)
Total Bilirubin: 1.1 mg/dL (ref 0.3–1.2)
Total Protein: 8 g/dL (ref 6.5–8.1)

## 2017-12-30 LAB — CBC WITH DIFFERENTIAL/PLATELET
Basophils Absolute: 0 10*3/uL (ref 0–0.1)
Basophils Relative: 0 %
Eosinophils Absolute: 0.1 10*3/uL (ref 0–0.7)
Eosinophils Relative: 1 %
HEMATOCRIT: 39.6 % — AB (ref 40.0–52.0)
HEMOGLOBIN: 13.4 g/dL (ref 13.0–18.0)
Lymphocytes Relative: 22 %
Lymphs Abs: 1.2 10*3/uL (ref 1.0–3.6)
MCH: 28.6 pg (ref 26.0–34.0)
MCHC: 33.9 g/dL (ref 32.0–36.0)
MCV: 84.5 fL (ref 80.0–100.0)
MONO ABS: 0.5 10*3/uL (ref 0.2–1.0)
MONOS PCT: 9 %
NEUTROS PCT: 68 %
Neutro Abs: 3.8 10*3/uL (ref 1.4–6.5)
Platelets: 189 10*3/uL (ref 150–440)
RBC: 4.69 MIL/uL (ref 4.40–5.90)
RDW: 15 % — ABNORMAL HIGH (ref 11.5–14.5)
WBC: 5.7 10*3/uL (ref 3.8–10.6)

## 2017-12-30 LAB — PSA: Prostatic Specific Antigen: 65.71 ng/mL — ABNORMAL HIGH (ref 0.00–4.00)

## 2017-12-30 MED ORDER — CALCIUM-VITAMIN D 500-200 MG-UNIT PO TABS: 1.0000 | ORAL_TABLET | Freq: Every day | ORAL | 0 refills | Status: AC

## 2017-12-30 MED ORDER — DENOSUMAB 120 MG/1.7ML ~~LOC~~ SOLN
120.0000 mg | Freq: Once | SUBCUTANEOUS | Status: AC
  Administered 2017-12-30: 120 mg via SUBCUTANEOUS
  Filled 2017-12-30: qty 1.7

## 2017-12-30 MED ORDER — SODIUM CHLORIDE 0.9 % IV SOLN
Freq: Once | INTRAVENOUS | Status: AC
  Administered 2017-12-30: 14:00:00 via INTRAVENOUS
  Filled 2017-12-30: qty 1000

## 2017-12-30 NOTE — Progress Notes (Signed)
Hematology/Oncology  Follow up note [facility] Telephone:(336) [phone] Fax:(336) [phone]  Patient Care Team: [place] Pink, MD as PCP - General (Family Medicine)  REASON FOR VISIT Follow up for antineoplasm treatment of Prostate Cancer  HISTORY OF PRESENTING ILLNESS:  Matthew Hunter 84 y.o.  male with PMH listed as below who was referred by [facility] Urology Provider [name] to me for evaluation of clinical prostate cancer and management.  He has a history of elevated PSA and BPH. PSA trends as follows: 11/14/2014 4.3, 11/07/2015 7.1, 05/11/2016 10.4, 06/11/2016 10.6, 07/07/2016 7.8, 01/06/2017 86.8.  He was seen by urology and was diagnosed with clinical prostate cancer given the extreme elevation of PSA and prostate nodule. Biopsy was not pursued. He was treated with [name] on 01/29/2017,  # prostate biopsy on 03/10/2017. 5 out of 6 cores positive for prostate cancer, with highest gleason score group 9 (4+5), + perineural invasion.  Stage IV prostate cancer, multiple osseus metastatic disease. No visceral involvement.   Current treatment  Patient got first Firmagon on 01/29/2018, switched to leupron on 04/13/2015,  Q6 months.  .Testosterone level at 14, castration level.  Upfront Gillermina Phy was added on December 2019 to assist fast disease control (phase II trial Tombal B, Lancet Oncol. 2014) S/p palliative RT to lumber spine and SI joints. Xtandi discontinued on due to CNS toxicity in April 2019.  12/09/17 Abiaterone discontinued.  7/ 18/2019 Start Apalutimide 240mg  daily.   INTERVAL HISTORY Patient presents to follow up on antineoplasm management of prostate cancer.  PSA 86.8 -->64.78 -->131--> 242 added Xtanti --> 17.74-->1.82 Xtandi stopped--> 1.79- (added Abiaterone)-> 11.06-->24.53--> 39.48 [Abiaterone stopped, Added Apalutimid] Reports having mild forgetfulness, however, at a much milder level comparing to [employer].  He has intermittent loose stool  episodes. Yesterday he had an episode. Today has not had any loose stool.   ROS:  Review of Systems  Constitutional: Negative for appetite change, chills, diaphoresis, fatigue, fever and unexpected weight change.  HENT:   Negative for hearing loss, lump/mass, mouth sores, nosebleeds and sore throat.   Eyes: Negative for eye problems and icterus.  Respiratory: Negative for chest tightness, cough, hemoptysis, shortness of breath and wheezing.   Cardiovascular: Negative for chest pain and leg swelling.  Gastrointestinal: Negative for abdominal distention, abdominal pain, blood in stool, diarrhea, nausea and rectal pain.  Endocrine: Negative for hot flashes.  Genitourinary: Positive for nocturia. Negative for bladder incontinence, difficulty urinating, dysuria, frequency and hematuria.   Musculoskeletal: Negative for back pain, flank pain, gait problem and myalgias.  Skin: Negative for itching and rash.  Neurological: Negative for dizziness, gait problem, headaches, numbness and seizures.  Hematological: Negative for adenopathy. Does not bruise/bleed easily.  Psychiatric/Behavioral: Negative for confusion, decreased concentration and depression. The patient is not nervous/anxious.     MEDICAL HISTORY:  Past Medical History:  Diagnosis Date  . Arthritis   . Benign enlargement of prostate   . CAD (coronary artery disease)   . Cancer ([place])   . Constipation   . Elevated PSA   . GERD (gastroesophageal reflux disease)   . Hypertension   . Incomplete bladder emptying   . Nocturia   . Prostate cancer ([place]) 05/03/2017  . Urgency of micturation   . Urinary frequency     SURGICAL HISTORY: Past Surgical History:  Procedure Laterality Date  . GALLBLADDER SURGERY    . PROSTATE SURGERY    . STOMACH SURGERY      SOCIAL HISTORY: Social History   Socioeconomic History  .  Marital status: Married    Spouse name: Not on file  . Number of children: Not on file  . Years of education: Not on  file  . Highest education level: Not on file  Occupational History  . Not on file  Social Needs  . Financial resource strain: Not on file  . Food insecurity:    Worry: Not on file    Inability: Not on file  . Transportation needs:    Medical: Not on file    Non-medical: Not on file  Tobacco Use  . Smoking status: Former Smoker    Last attempt to quit: 06/01/1998    Years since quitting: 19.5  . Smokeless tobacco: Never Used  Substance and Sexual Activity  . Alcohol use: No    Alcohol/week: 0.0 oz  . Drug use: Yes    Frequency: 2.0 times per week    Comment: marijuana two time monthly gives him an appetite  . Sexual activity: Not on file  Lifestyle  . Physical activity:    Days per week: Not on file    Minutes per session: Not on file  . Stress: Not on file  Relationships  . Social connections:    Talks on phone: Not on file    Gets together: Not on file    Attends religious service: Not on file    Active member of club or organization: Not on file    Attends meetings of clubs or organizations: Not on file    Relationship status: Not on file  . Intimate partner violence:    Fear of current or ex partner: Not on file    Emotionally abused: Not on file    Physically abused: Not on file    Forced sexual activity: Not on file  Other Topics Concern  . Not on file  Social History Narrative  . Not on file    FAMILY HISTORY: Family History  Problem Relation Age of Onset  . Hypertension Mother   . Stroke Father   . Kidney disease Neg Hx   . Prostate cancer Neg Hx   . Kidney cancer Neg Hx   . Bladder Cancer Neg Hx     ALLERGIES:  has No Known Allergies.  MEDICATIONS:  Current Outpatient Medications  Medication Sig Dispense Refill  . apalutamide (ERLEADA) 60 MG tablet Take 4 tablets (240 mg total) by mouth daily. May be taken with or without food. 120 tablet 2  . aspirin EC 81 MG tablet Take 81 mg by mouth daily.     Marland Kitchen atenolol (TENORMIN) 100 MG tablet Take 100 mg  by mouth daily.     Marland Kitchen dutasteride (AVODART) 0.5 MG capsule Take 1 capsule (0.5 mg total) by mouth daily. 30 capsule 0  . finasteride (PROSCAR) 5 MG tablet Take 1 tablet (5 mg total) by mouth daily. 90 tablet 3  . hydrocortisone 2.5 % cream APP EXT AA BID  0  . mirabegron ER (MYRBETRIQ) 25 MG TB24 tablet Take 1 tablet (25 mg total) by mouth daily. 90 tablet 3  . nortriptyline (PAMELOR) 25 MG capsule Take 25 mg by mouth at bedtime. Reported on 05/16/2015  1  . nystatin (MYCOSTATIN) 100000 UNIT/ML suspension SWISH WITH 1 TEASPOONFUL BY MOUTH AND THEN SWALLOW. USE 4 TIMES A...  (REFER TO PRESCRIPTION NOTES).  0  . omeprazole (PRILOSEC) 20 MG capsule Take 20 mg by mouth daily.    Marland Kitchen pyridOXINE (VITAMIN B-6) 50 MG tablet Take 50 mg by mouth daily.     Marland Kitchen  tamsulosin (FLOMAX) 0.4 MG CAPS capsule Take 1 capsule (0.4 mg total) by mouth daily. 90 capsule 3  . Telmisartan-Amlodipine 80-5 MG TABS Take 1 tablet by mouth daily.     . Calcium Carb-Cholecalciferol (CALCIUM-VITAMIN D) 500-200 MG-UNIT tablet Take 1 tablet by mouth daily. 90 tablet 0   No current facility-administered medications for this visit.       Marland Kitchen  PHYSICAL EXAMINATION: ECOG PERFORMANCE STATUS: 1 - Symptomatic but completely ambulatory Vitals:   12/30/17 1331  BP: (!) 145/82  Pulse: 82  Resp: 18  Temp: (!) 96.9 F (36.1 C)   Filed Weights   12/30/17 1331  Weight: 187 lb 12.8 oz (85.2 kg)   Physical Exam  Constitutional: He is oriented to person, place, and time. He appears well-developed and well-nourished. No distress.  HENT:  Head: Normocephalic and atraumatic.  Right Ear: External ear normal.  Left Ear: External ear normal.  Mouth/Throat: Oropharynx is clear and moist. No oropharyngeal exudate.  Eyes: Pupils are equal, round, and reactive to light. Conjunctivae and EOM are normal. No scleral icterus.  Neck: Normal range of motion. Neck supple. No JVD present. No thyromegaly present.  Cardiovascular: Normal rate, regular  rhythm and normal heart sounds.  No murmur heard. Pulmonary/Chest: Effort normal and breath sounds normal. No stridor. No respiratory distress. He has no wheezes. He has no rales. He exhibits no tenderness.  Abdominal: Soft. Bowel sounds are normal. He exhibits no distension and no mass. There is no tenderness. There is no guarding.  Musculoskeletal: Normal range of motion. He exhibits no edema or deformity.  Lymphadenopathy:    He has no cervical adenopathy.  Neurological: He is alert and oriented to person, place, and time. He displays normal reflexes. No cranial nerve deficit. Coordination normal.  Skin: Skin is warm and dry. No rash noted. He is not diaphoretic. No erythema.  Psychiatric: He has a normal mood and affect. His behavior is normal. Thought content normal.   LABORATORY DATA:  I have reviewed the data as listed Lab Results  Component Value Date   WBC 5.7 12/30/2017   HGB 13.4 12/30/2017   HCT 39.6 (L) 12/30/2017   MCV 84.5 12/30/2017   PLT 189 12/30/2017   Recent Labs    11/30/17 1034 12/16/17 1334 12/30/17 1131  NA 141 140 142  K 3.7 4.0 3.9  CL 111 110 110  CO2 24 22 21*  GLUCOSE 100* 101* 145*  BUN 12 10 13   CREATININE 1.05 1.00 1.38*  CALCIUM 9.7 9.5 9.8  GFRNONAA >60 >60 45*  GFRAA >60 >60 52*  PROT 7.4 7.3 8.0  ALBUMIN 4.0 3.9 4.1  AST 23 25 33  ALT 14 14 16   ALKPHOS 57 50 51  BILITOT 1.4* 1.1 1.1     ASSESSMENT & PLAN: 84 yo male follows up for management of metastatic prostate cancer .  1. Prostate cancer ([place])   2. AKI (acute kidney injury) ([place])   3. Prostate cancer metastatic to bone [facility])    # Prostate cancer, progression, now castration resistance.  Tolerate Apalutimide well. Continue. PSA pending.  # Bone metastasis: Xgeva x 1 today.  Advise patient to take Calcium and Vitamin D. Rx sent to pharmacy.   # AKI, creatinine 1.38, increased from his baseline. ? Due to diarrhea. Will give IVF 548ml x 1.  Check US kidney.  Repeat bmp  tomorrow and may need IVF again.  Orders Placed This Encounter  Procedures  . US RENAL  Standing Status:   Future    Standing Expiration Date:   03/02/2019    Order Specific Question:   Reason for Exam (SYMPTOM  OR DIAGNOSIS REQUIRED)    Answer:   acute kidney injury    Order Specific Question:   Preferred imaging location?    Answer:   [facility]  . Basic metabolic panel    Standing Status:   Future    Standing Expiration Date:   12/30/2018  . CBC with Differential/Platelet    Standing Status:   Future    Standing Expiration Date:   12/30/2018  . Comprehensive metabolic panel    Standing Status:   Future    Standing Expiration Date:   12/30/2018   All questions were answered. The patient knows to call the clinic with any problems questions or concerns. T Return of visit: 2 weeks.    Earlie Server, MD, PhD Hematology Oncology [facility] at [facility] Pager- [phone] 12/30/2017

## 2017-12-30 NOTE — Progress Notes (Signed)
Patient here for follow up. Pt complains of itching and burning to legs at night.

## 2017-12-30 NOTE — Addendum Note (Signed)
Addended by: Earlie Server on: 12/30/2017 07:10 PM   Modules accepted: Orders

## 2017-12-31 ENCOUNTER — Inpatient Hospital Stay: Payer: Medicare Other

## 2017-12-31 ENCOUNTER — Telehealth: Payer: Self-pay | Admitting: Oncology

## 2017-12-31 DIAGNOSIS — C61 Malignant neoplasm of prostate: Secondary | ICD-10-CM | POA: Diagnosis not present

## 2017-12-31 LAB — BASIC METABOLIC PANEL
Anion gap: 8 (ref 5–15)
BUN: 11 mg/dL (ref 8–23)
CHLORIDE: 107 mmol/L (ref 98–111)
CO2: 23 mmol/L (ref 22–32)
Calcium: 9.7 mg/dL (ref 8.9–10.3)
Creatinine, Ser: 1.19 mg/dL (ref 0.61–1.24)
GFR calc Af Amer: >60 mL/min (ref >60)
GFR calc non Af Amer: 54 mL/min — ABNORMAL LOW (ref >60)
Glucose, Bld: 105 mg/dL — ABNORMAL HIGH (ref 70–99)
POTASSIUM: 3.9 mmol/L (ref 3.5–5.1)
SODIUM: 138 mmol/L (ref 135–145)

## 2017-12-31 NOTE — Progress Notes (Signed)
Per Dr. Tasia Catchings no IV fluids needed today since kidney function is back to normal.

## 2017-12-31 NOTE — Telephone Encounter (Signed)
Called patient and spoke to wife Stanton Kidney about calc-vit D sent to pharmacy. She acknowledged understanding and said they would pick up so he could start taking.

## 2018-01-04 ENCOUNTER — Ambulatory Visit
Admission: RE | Admit: 2018-01-04 | Discharge: 2018-01-04 | Disposition: A | Discharge status: Home / Self Care | Payer: Medicare Other | Source: Ambulatory Visit | Attending: Oncology | Admitting: Oncology

## 2018-01-04 DIAGNOSIS — N281 Cyst of kidney, acquired: Secondary | ICD-10-CM | POA: Insufficient documentation

## 2018-01-04 DIAGNOSIS — C61 Malignant neoplasm of prostate: Secondary | ICD-10-CM | POA: Diagnosis present

## 2018-01-04 DIAGNOSIS — N200 Calculus of kidney: Secondary | ICD-10-CM | POA: Diagnosis not present

## 2018-01-10 ENCOUNTER — Telehealth: Payer: Self-pay | Admitting: *Deleted

## 2018-01-10 ENCOUNTER — Encounter: Payer: Self-pay | Admitting: Nurse Practitioner

## 2018-01-10 ENCOUNTER — Inpatient Hospital Stay (HOSPITAL_BASED_OUTPATIENT_CLINIC_OR_DEPARTMENT_OTHER): Payer: Medicare Other | Admitting: Nurse Practitioner

## 2018-01-10 ENCOUNTER — Inpatient Hospital Stay: Payer: Medicare Other

## 2018-01-10 VITALS — BP 165/84 | HR 80 | Temp 97.2°F | Resp 16 | Wt 186.0 lb

## 2018-01-10 DIAGNOSIS — R5383 Other fatigue: Secondary | ICD-10-CM

## 2018-01-10 DIAGNOSIS — F5089 Other specified eating disorder: Secondary | ICD-10-CM

## 2018-01-10 DIAGNOSIS — R197 Diarrhea, unspecified: Secondary | ICD-10-CM

## 2018-01-10 DIAGNOSIS — A0472 Enterocolitis due to Clostridium difficile, not specified as recurrent: Secondary | ICD-10-CM

## 2018-01-10 DIAGNOSIS — R5381 Other malaise: Secondary | ICD-10-CM

## 2018-01-10 DIAGNOSIS — R42 Dizziness and giddiness: Secondary | ICD-10-CM

## 2018-01-10 DIAGNOSIS — C61 Malignant neoplasm of prostate: Secondary | ICD-10-CM | POA: Diagnosis not present

## 2018-01-10 DIAGNOSIS — C7951 Secondary malignant neoplasm of bone: Secondary | ICD-10-CM

## 2018-01-10 DIAGNOSIS — R630 Anorexia: Secondary | ICD-10-CM

## 2018-01-10 DIAGNOSIS — Z221 Carrier of other intestinal infectious diseases: Secondary | ICD-10-CM

## 2018-01-10 LAB — BASIC METABOLIC PANEL
Anion gap: 10 (ref 5–15)
BUN: 12 mg/dL (ref 8–23)
CO2: 22 mmol/L (ref 22–32)
CREATININE: 1.09 mg/dL (ref 0.61–1.24)
Calcium: 9 mg/dL (ref 8.9–10.3)
Chloride: 109 mmol/L (ref 98–111)
GFR calc Af Amer: >60 mL/min (ref >60)
GFR calc non Af Amer: 60 mL/min — ABNORMAL LOW (ref >60)
GLUCOSE: 183 mg/dL — AB (ref 70–99)
Potassium: 4.4 mmol/L (ref 3.5–5.1)
Sodium: 141 mmol/L (ref 135–145)

## 2018-01-10 LAB — TSH: TSH: 1.742 u[IU]/mL (ref 0.350–4.500)

## 2018-01-10 NOTE — Progress Notes (Signed)
[facility] [place]  Telephone:([phone] [phone]  Patient Care Team: [place] Pink, MD as PCP - General (Family Medicine) Earlie Server, MD as Medical Oncologist (Medical Oncology)   Name of the patient: Matthew Hunter  [id number]  December 07, 1929    Date of visit: 01/10/18  Diagnosis- Prostate Cancer  Chief complaint/ Reason for visit- Dizziness, Diarrhea  Heme/Onc history:  Patient was initially referred to Dr. Tasia Catchings by [facility] urology provider, [name], PA, for evaluation of clinical prostate cancer and management.   He had a history of elevated PSA and BPH and was diagnosed with clinical prostate cancer given extreme elevation of PSA and prostate nodule.  Biopsy was not pursued at that time and he was treated with [name].   On 03/10/2017 prostate biopsy was performed.  5 out of 6 cores positive for prostate cancer with highest Gleason score proved 9 (4+5), positive perineural invasion.  Stage IV prostate cancer with multiple osseous mets.  No visceral involvement.  Interval history- Matthew Hunter, is 84 year old male with prostate cancer, who [facility] complaining of dizziness. He noticed symptoms 2 days ago and described feeling dizzy and feeling as though he 'was going to black out' when he was laying down for bed. He states this has happened in the past but he hasn't felt symptoms this severe 'in a long time'. 'Blacking out' feeling has not occurred again but he continues to report poor appetite, fatigue/tired, diarrhea x 2 episodes, itching of the lower extremities which is worse at night. He says that he's 'just feeling bad', has no energy which has gradually worsened over past several months. Currently on Apalutimide 240 mg daily. Previously Abiaterone was discontinued and prior to that, Xtandi d/t CNS toxicity in April 2019. He continues to have impaired memory associated with Xtandi. Due to  impaired memory, wife contributes to history and ROS.    ECOG FS:1 - Symptomatic but completely ambulatory  Review of systems- Review of Systems  Constitutional: Positive for malaise/fatigue and weight loss (Poor appetite). Negative for chills and fever.  HENT: Negative for congestion, ear discharge, ear pain, sinus pain, sore throat and tinnitus.   Eyes: Negative.   Respiratory: Negative.  Negative for cough, sputum production and shortness of breath.   Cardiovascular: Negative for chest pain, palpitations, orthopnea, claudication and leg swelling.  Gastrointestinal: Negative for abdominal pain, blood in stool, constipation, diarrhea, heartburn, nausea and vomiting.  Genitourinary: Negative.   Musculoskeletal: Positive for myalgias.  Skin: Negative.   Neurological: Positive for dizziness and weakness. Negative for tingling, loss of consciousness (Near syncope x1) and headaches.  Endo/Heme/Allergies: Negative.   Psychiatric/Behavioral: Positive for memory loss (Memory impairment-chronic, no worse).     No Known Allergies  Past Medical History:  Diagnosis Date  . Arthritis   . Benign enlargement of prostate   . CAD (coronary artery disease)   . Cancer ([place])   . Constipation   . Elevated PSA   . GERD (gastroesophageal reflux disease)   . Hypertension   . Incomplete bladder emptying   . Nocturia   . Prostate cancer ([place]) 05/03/2017  . Urgency of micturation   . Urinary frequency     Past Surgical History:  Procedure Laterality Date  . GALLBLADDER SURGERY    . PROSTATE SURGERY    . STOMACH SURGERY      Social History   Socioeconomic History  . Marital status: Married    Spouse name: Not on file  .  Number of children: Not on file  . Years of education: Not on file  . Highest education level: Not on file  Occupational History  . Not on file  Social Needs  . Financial resource strain: Not on file  . Food insecurity:    Worry: Not on file    Inability: Not on file    . Transportation needs:    Medical: Not on file    Non-medical: Not on file  Tobacco Use  . Smoking status: Former Smoker    Last attempt to quit: 06/01/1998    Years since quitting: 19.6  . Smokeless tobacco: Never Used  Substance and Sexual Activity  . Alcohol use: No    Alcohol/week: 0.0 standard drinks  . Drug use: Yes    Frequency: 2.0 times per week    Comment: marijuana two time monthly gives him an appetite  . Sexual activity: Not on file  Lifestyle  . Physical activity:    Days per week: Not on file    Minutes per session: Not on file  . Stress: Not on file  Relationships  . Social connections:    Talks on phone: Not on file    Gets together: Not on file    Attends religious service: Not on file    Active member of club or organization: Not on file    Attends meetings of clubs or organizations: Not on file    Relationship status: Not on file  . Intimate partner violence:    Fear of current or ex partner: Not on file    Emotionally abused: Not on file    Physically abused: Not on file    Forced sexual activity: Not on file  Other Topics Concern  . Not on file  Social History Narrative  . Not on file    Family History  Problem Relation Age of Onset  . Hypertension Mother   . Stroke Father   . Kidney disease Neg Hx   . Prostate cancer Neg Hx   . Kidney cancer Neg Hx   . Bladder Cancer Neg Hx      Current Outpatient Medications:  .  apalutamide (ERLEADA) 60 MG tablet, Take 4 tablets (240 mg total) by mouth daily. May be taken with or without food., Disp: 120 tablet, Rfl: 2 .  atenolol (TENORMIN) 100 MG tablet, Take 100 mg by mouth daily. , Disp: , Rfl:  .  Calcium Carb-Cholecalciferol (CALCIUM-VITAMIN D) 500-200 MG-UNIT tablet, Take 1 tablet by mouth daily., Disp: 90 tablet, Rfl: 0 .  dutasteride (AVODART) 0.5 MG capsule, Take 1 capsule (0.5 mg total) by mouth daily., Disp: 30 capsule, Rfl: 0 .  finasteride (PROSCAR) 5 MG tablet, Take 1 tablet (5 mg total)  by mouth daily., Disp: 90 tablet, Rfl: 3 .  hydrocortisone 2.5 % cream, APP EXT AA BID, Disp: , Rfl: 0 .  mirabegron ER (MYRBETRIQ) 25 MG TB24 tablet, Take 1 tablet (25 mg total) by mouth daily., Disp: 90 tablet, Rfl: 3 .  nortriptyline (PAMELOR) 25 MG capsule, Take 25 mg by mouth at bedtime. Reported on 05/16/2015, Disp: , Rfl: 1 .  nystatin (MYCOSTATIN) 100000 UNIT/ML suspension, SWISH WITH 1 TEASPOONFUL BY MOUTH AND THEN SWALLOW. USE 4 TIMES A...  (REFER TO PRESCRIPTION NOTES)., Disp: , Rfl: 0 .  omeprazole (PRILOSEC) 20 MG capsule, Take 20 mg by mouth daily., Disp: , Rfl:  .  pyridOXINE (VITAMIN B-6) 50 MG tablet, Take 50 mg by mouth daily. , Disp: , Rfl:  .  tamsulosin (FLOMAX) 0.4 MG CAPS capsule, Take 1 capsule (0.4 mg total) by mouth daily., Disp: 90 capsule, Rfl: 3 .  Telmisartan-Amlodipine 80-5 MG TABS, Take 1 tablet by mouth daily. , Disp: , Rfl:   Physical exam:  Vitals:   01/10/18 1429  BP: (!) 165/84  Pulse: 80  Resp: 16  Temp: (!) 97.2 F (36.2 C)  TempSrc: Tympanic  Weight: 186 lb (84.4 kg)   GENERAL: Elderly gentleman, thin built alert, no distress and comfortable.  Accompanied EYES: no pallor or icterus OROPHARYNX: no thrush or ulceration NECK: supple; no lymph nodes felt LUNGS: Decreased breath sounds auscultation bilaterally. No wheeze or crackles HEART/CVS: regular rate & rhythm and no murmurs; No lower extremity edema ABDOMEN: abdomen soft, non-tender and normal bowel sounds.  Musculoskeletal: no cyanosis of digits and no clubbing.  Ambulates without aids PSYCH: alert & oriented x 3 with fluent speech.   NEURO: no focal motor/sensory deficits. Impaired memory, remote and recent SKIN: no rashes or significant lesions   CMP Latest Ref Rng & Units 01/10/2018  Glucose 70 - 99 mg/dL 183(H)  BUN 8 - 23 mg/dL 12  Creatinine 0.61 - 1.24 mg/dL 1.09  Sodium 135 - 145 mmol/L 141  Potassium 3.5 - 5.1 mmol/L 4.4  Chloride 98 - 111 mmol/L 109  CO2 22 - 32 mmol/L 22    Calcium 8.9 - 10.3 mg/dL 9.0  Total Protein 6.5 - 8.1 g/dL -  Total Bilirubin 0.3 - 1.2 mg/dL -  Alkaline Phos 38 - 126 U/L -  AST 15 - 41 U/L -  ALT 0 - 44 U/L -   CBC Latest Ref Rng & Units 12/30/2017  WBC 3.8 - 10.6 K/uL 5.7  Hemoglobin 13.0 - 18.0 g/dL 13.4  Hematocrit 40.0 - 52.0 % 39.6(L)  Platelets 150 - 440 K/uL 189    No images are attached to the encounter.  Nm Bone Scan Whole Body  Result Date: 12/15/2017 CLINICAL DATA:  Prostate cancer metastatic to bone, encounter for antineoplastic chemotherapy, no recent injury EXAM: NUCLEAR MEDICINE WHOLE BODY BONE SCAN TECHNIQUE: Whole body anterior and posterior images were obtained approximately 3 hours after intravenous injection of radiopharmaceutical. RADIOPHARMACEUTICALS:  23.09 mCi Technetium-28m MDP IV COMPARISON:  02/03/2017 Radiographic correlation: None recent FINDINGS: Multiple sites of abnormal increased osseous tracer accumulation are identified consistent with widespread osseous metastatic disease. These include calvarium, BILATERAL anterior and posterior ribs, sternum, cervical, thoracic, and lumbar spine, pelvis, RIGHT scapula, proximal LEFT humerus, LEFT femoral diaphysis, probably proximal LEFT femur. When compared to the previous exam, new sites of osseous tracer accumulation are identified especially 2 sites at the distal LEFT femoral diaphysis, L2 vertebral body, RIGHT iliac bone, and RIGHT ischium. Expected urinary tract and soft tissue distribution of tracer. IMPRESSION: Widespread osseous metastatic disease progressive since previous exam. Electronically Signed   By: Lavonia Dana M.D.   On: 12/15/2017 17:39   US Renal  Result Date: 01/04/2018 CLINICAL DATA:  Acute renal insufficiency.  Prostate carcinoma EXAM: RENAL / URINARY TRACT ULTRASOUND COMPLETE COMPARISON:  CT abdomen and pelvis February 03, 2017 FINDINGS: Right Kidney: Length: 10.2 cm. Echogenicity and renal cortical thickness are within normal limits. No  perinephric fluid, mass, or hydronephrosis visualized. No sonographically demonstrable calculus or ureterectasis. Left Kidney: Length: 11.1 cm. Echogenicity and renal cortical thickness are within normal limits. No perinephric fluid or hydronephrosis visualized. There is a cyst in the mid left kidney measuring 2.0 x 2.0 x 1.9 cm. There is a calculus, nonobstructing, in the  lower pole left kidney measuring 7 mm. No ureterectasis. Bladder: Appears normal for degree of bladder distention. Flow from each distal ureter noted within the bladder by color Doppler examination. IMPRESSION: Cyst mid left kidney measuring 2.0 x 2.0 x 1.9 cm. 7 mm nonobstructing calculus lower pole left kidney. Study otherwise unremarkable. Electronically Signed   By: Lowella Grip III M.D.   On: 01/04/2018 14:06    Assessment and plan- Patient is a 84 y.o. male with prostate cancer who presents to symptom management for dizziness, malaise, weakness, and poor appetite.  1. Prostate Cancer- Castrate resistant. Was unable to tolerate Xtandi. PSA rose on Abiateron. Currently on apalutamide.  PSA recently rising.  Will check PSA with next visit and reimage.  We will have patient follow-up with Dr. Tasia Catchings for discussion of results and management.   2. Dizziness/Malaise- etiology unclear but questioning if r/t Erleada vs disease progression vs others. TSH today is normal. B12 increased. Not orthostatic. Other labs today reviewed and unremarkable. Multiple vague complaints and rising PSA concerning for progression.   3. C diff carrier- 2-3 episodes of diarrhea over weekend, now resolved. C diff antigen positive, negative for toxin. Reflexed to toxigenic c.diff by pcr which was positive with little to no toxin production. Discussed that he is capable of transmitting infection to others but given that he is not currently symptomatic there is no recommendation to treat.  However, if he become symptomatic, would recommend antibiotics.  4. Poor  Appetite- Weight has decreased recently but overall essentially stable. Question if appetite/poor nutrition contributing to malaise. Could consider appetite stimulant but in view of upcoming imaging will defer to Dr. Tasia Catchings.   Discussed findings with Dr. Tasia Catchings who recommends CT chest/abdomen/pelvis for further evaluation.  She would like to see patient in clinic on 01/13/18 to discuss further and check PSA.   Visit Diagnosis 1. Prostate cancer ([place])   2. Malaise and fatigue   3. Dizziness   4. 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 Appetite impaired     Patient expressed understanding and was in agreement with this plan. He also understands that He can call clinic at any time with any questions, concerns, or complaints.   Thank you for allowing me to participate in the care of this very pleasant patient.   Beckey Rutter, DNP, AGNP-C [place] at [facility] [phone] (work cell) [phone] (office)

## 2018-01-10 NOTE — Telephone Encounter (Signed)
Patient came into office reporting that he is having watery diarrhea and dizziness. Labs ordered after discussing with NP in [facility] and patient will return at 230 to be seen

## 2018-01-11 ENCOUNTER — Other Ambulatory Visit: Payer: Self-pay

## 2018-01-11 DIAGNOSIS — C61 Malignant neoplasm of prostate: Secondary | ICD-10-CM | POA: Diagnosis not present

## 2018-01-11 DIAGNOSIS — R197 Diarrhea, unspecified: Secondary | ICD-10-CM

## 2018-01-11 DIAGNOSIS — R42 Dizziness and giddiness: Secondary | ICD-10-CM

## 2018-01-11 LAB — C DIFFICILE QUICK SCREEN W PCR REFLEX
C DIFFICILE (CDIFF) TOXIN: NEGATIVE
C Diff antigen: POSITIVE — AB

## 2018-01-11 LAB — GASTROINTESTINAL PANEL BY PCR, STOOL (REPLACES STOOL CULTURE)
Adenovirus F40/41: NOT DETECTED
Astrovirus: NOT DETECTED
CAMPYLOBACTER SPECIES: NOT DETECTED
CRYPTOSPORIDIUM: NOT DETECTED
CYCLOSPORA CAYETANENSIS: NOT DETECTED
ENTEROTOXIGENIC E COLI (ETEC): NOT DETECTED
Entamoeba histolytica: NOT DETECTED
Enteroaggregative E coli (EAEC): NOT DETECTED
Enteropathogenic E coli (EPEC): NOT DETECTED
Giardia lamblia: NOT DETECTED
Norovirus GI/GII: NOT DETECTED
PLESIMONAS SHIGELLOIDES: NOT DETECTED
ROTAVIRUS A: NOT DETECTED
SAPOVIRUS (I, II, IV, AND V): NOT DETECTED
SHIGA LIKE TOXIN PRODUCING E COLI (STEC): NOT DETECTED
Salmonella species: NOT DETECTED
Shigella/Enteroinvasive E coli (EIEC): NOT DETECTED
VIBRIO SPECIES: NOT DETECTED
Vibrio cholerae: NOT DETECTED
YERSINIA ENTEROCOLITICA: NOT DETECTED

## 2018-01-11 LAB — CLOSTRIDIUM DIFFICILE BY PCR, REFLEXED: CDIFFPCR: POSITIVE — AB

## 2018-01-13 ENCOUNTER — Other Ambulatory Visit: Payer: Self-pay

## 2018-01-13 ENCOUNTER — Encounter: Payer: Self-pay | Admitting: Oncology

## 2018-01-13 ENCOUNTER — Inpatient Hospital Stay (HOSPITAL_BASED_OUTPATIENT_CLINIC_OR_DEPARTMENT_OTHER): Payer: Medicare Other | Admitting: Oncology

## 2018-01-13 ENCOUNTER — Inpatient Hospital Stay: Payer: Medicare Other

## 2018-01-13 VITALS — BP 124/74 | HR 78 | Resp 16 | Wt 186.0 lb

## 2018-01-13 DIAGNOSIS — R42 Dizziness and giddiness: Secondary | ICD-10-CM | POA: Diagnosis not present

## 2018-01-13 DIAGNOSIS — M899 Disorder of bone, unspecified: Secondary | ICD-10-CM

## 2018-01-13 DIAGNOSIS — C61 Malignant neoplasm of prostate: Secondary | ICD-10-CM

## 2018-01-13 DIAGNOSIS — C7951 Secondary malignant neoplasm of bone: Secondary | ICD-10-CM

## 2018-01-13 DIAGNOSIS — F5089 Other specified eating disorder: Secondary | ICD-10-CM

## 2018-01-13 DIAGNOSIS — R9721 Rising PSA following treatment for malignant neoplasm of prostate: Secondary | ICD-10-CM

## 2018-01-13 DIAGNOSIS — K921 Melena: Secondary | ICD-10-CM

## 2018-01-13 DIAGNOSIS — R972 Elevated prostate specific antigen [PSA]: Secondary | ICD-10-CM

## 2018-01-13 DIAGNOSIS — G893 Neoplasm related pain (acute) (chronic): Secondary | ICD-10-CM

## 2018-01-13 LAB — COMPREHENSIVE METABOLIC PANEL WITH GFR
ALT: 16 U/L (ref 0–44)
AST: 29 U/L (ref 15–41)
Albumin: 4.4 g/dL (ref 3.5–5.0)
Alkaline Phosphatase: 52 U/L (ref 38–126)
Anion gap: 9 (ref 5–15)
BUN: 12 mg/dL (ref 8–23)
CO2: 25 mmol/L (ref 22–32)
Calcium: 10.1 mg/dL (ref 8.9–10.3)
Chloride: 106 mmol/L (ref 98–111)
Creatinine, Ser: 1.21 mg/dL (ref 0.61–1.24)
GFR calc Af Amer: >60 mL/min
GFR calc non Af Amer: 53 mL/min — ABNORMAL LOW
Glucose, Bld: 119 mg/dL — ABNORMAL HIGH (ref 70–99)
Potassium: 4.1 mmol/L (ref 3.5–5.1)
Sodium: 140 mmol/L (ref 135–145)
Total Bilirubin: 0.9 mg/dL (ref 0.3–1.2)
Total Protein: 8.1 g/dL (ref 6.5–8.1)

## 2018-01-13 LAB — CBC WITH DIFFERENTIAL/PLATELET
BASOS ABS: 0 10*3/uL (ref 0–0.1)
Basophils Relative: 0 %
EOS PCT: 1 %
Eosinophils Absolute: 0.1 10*3/uL (ref 0–0.7)
HCT: 39.9 % — ABNORMAL LOW (ref 40.0–52.0)
Hemoglobin: 13.6 g/dL (ref 13.0–18.0)
LYMPHS ABS: 1.4 10*3/uL (ref 1.0–3.6)
LYMPHS PCT: 21 %
MCH: 28.7 pg (ref 26.0–34.0)
MCHC: 34.1 g/dL (ref 32.0–36.0)
MCV: 84.3 fL (ref 80.0–100.0)
MONO ABS: 0.7 10*3/uL (ref 0.2–1.0)
Monocytes Relative: 10 %
Neutro Abs: 4.6 10*3/uL (ref 1.4–6.5)
Neutrophils Relative %: 68 %
PLATELETS: 203 10*3/uL (ref 150–440)
RBC: 4.73 MIL/uL (ref 4.40–5.90)
RDW: 15.2 % — AB (ref 11.5–14.5)
WBC: 6.8 10*3/uL (ref 3.8–10.6)

## 2018-01-13 LAB — PSA: Prostatic Specific Antigen: 88.63 ng/mL — ABNORMAL HIGH (ref 0.00–4.00)

## 2018-01-13 LAB — VITAMIN B12: Vitamin B-12: 1190 pg/mL — ABNORMAL HIGH (ref 180–914)

## 2018-01-13 MED ORDER — MEGESTROL ACETATE 40 MG PO TABS: 40.0000 mg | ORAL_TABLET | Freq: Every day | ORAL | 0 refills | Status: DC

## 2018-01-13 MED ORDER — OXYCODONE HCL 5 MG PO TABS: 5.0000 mg | ORAL_TABLET | Freq: Four times a day (QID) | ORAL | 0 refills | Status: DC | PRN

## 2018-01-13 NOTE — Progress Notes (Signed)
Patient here today for follow up.  Patient states no new concerns today  

## 2018-01-13 NOTE — Progress Notes (Signed)
Hematology/Oncology  Follow up note [facility] Telephone:([phone] Fax:([phone]) [phone]  Patient Care Team: Maryland Pink, MD as PCP - General (Family Medicine) Earlie Server, MD as Medical Oncologist (Medical Oncology)  REASON FOR VISIT Follow up for antineoplasm treatment of Prostate Cancer  HISTORY OF PRESENTING ILLNESS:  Matthew Hunter 84 y.o.  male with PMH listed as below who was referred by [facility] Provider [name] to me for evaluation of clinical prostate cancer and management.  He has a history of elevated PSA and BPH. PSA trends as follows: 11/14/2014 4.3, 11/07/2015 7.1, 05/11/2016 10.4, 06/11/2016 10.6, 07/07/2016 7.8, 01/06/2017 86.8.  He was seen by urology and was diagnosed with clinical prostate cancer given the extreme elevation of PSA and prostate nodule. Biopsy was not pursued. He was treated with [name] on 01/29/2017,  # prostate biopsy on 03/10/2017. 5 out of 6 cores positive for prostate cancer, with highest gleason score group 9 (4+5), + perineural invasion.  Stage IV prostate cancer, multiple osseus metastatic disease. No visceral involvement.   Current treatment  Patient got first Firmagon on 01/29/2018, switched to leupron on 04/13/2015,  Q6 months.  .Testosterone level at 14, castration level.  Upfront Gillermina Phy was added on December 2019 to assist fast disease control (phase II trial [place] B, [facility] Oncol. 2014) S/p palliative RT to lumber spine and SI joints. Xtandi discontinued on due to CNS toxicity in April 2019.  12/09/17 Abiaterone discontinued.  7/ 18/2019 Start Apalutimide 240mg  daily.   INTERVAL HISTORY Patient presents to follow up for management of metastatic prostate cancer.  PSA 86.8 -->64.78 -->131--> 242 added Xtanti --> 17.74-->1.82 Xtandi stopped--> 1.79- (added Abiaterone)-> 11.06-->24.53--> 39.48 [Abiaterone stopped, Added Apalutimid]--> 65.71  #During interval, patient was seen by nurse practitioner for  symptom management due to dizziness, feeling that he was going to blackout.  Lack of appetite, fatigue.  He also has had loose stool episodes, averaging 2-3 times a day. He was recently started on apalutamide on 12/16/2017.  Initially he appears tolerating the treatment well.  #Today he also reports worsening of his lower back pain.  He asked if he can get some pain medication to relieve his pain.  This is a relatively new problem for him.  He has not had back pain after initial radiation to patient's lower back. Rates the pain as 6-7 out of 10, sharp, not associated with lower extremity focal weakness, saddle numbness, incontinence.  No aggravating or alleviating factors.  #Fatigue, has got worse.  Lack of appetite. #Reports black stool.  No bright red blood in stool.  Denies any abdominal pain, cramps.  ROS:  Review of Systems  Constitutional: Positive for fatigue. Negative for appetite change, chills, diaphoresis, fever and unexpected weight change.  HENT:   Negative for hearing loss, lump/mass, mouth sores, nosebleeds and sore throat.   Eyes: Negative for eye problems and icterus.  Respiratory: Negative for chest tightness, cough, hemoptysis and shortness of breath.   Cardiovascular: Negative for chest pain and leg swelling.  Gastrointestinal: Negative for abdominal distention, abdominal pain, blood in stool, diarrhea, nausea and rectal pain.  Endocrine: Negative for hot flashes.  Genitourinary: Positive for nocturia. Negative for bladder incontinence, difficulty urinating, dysuria, frequency and hematuria.   Musculoskeletal: Positive for back pain. Negative for flank pain, gait problem and myalgias.  Skin: Negative for itching and rash.  Neurological: Negative for dizziness, gait problem, headaches, numbness and seizures.  Hematological: Negative for adenopathy. Does not bruise/bleed easily.  Psychiatric/Behavioral: Negative for decreased concentration  and depression.    MEDICAL  HISTORY:  Past Medical History:  Diagnosis Date  . Arthritis   . Benign enlargement of prostate   . CAD (coronary artery disease)   . Cancer ([place])   . Constipation   . Elevated PSA   . GERD (gastroesophageal reflux disease)   . Hypertension   . Incomplete bladder emptying   . Nocturia   . Prostate cancer ([place]) 05/03/2017  . Urgency of micturation   . Urinary frequency     SURGICAL HISTORY: Past Surgical History:  Procedure Laterality Date  . GALLBLADDER SURGERY    . PROSTATE SURGERY    . STOMACH SURGERY      SOCIAL HISTORY: Social History   Socioeconomic History  . Marital status: Married    Spouse name: Not on file  . Number of children: Not on file  . Years of education: Not on file  . Highest education level: Not on file  Occupational History  . Not on file  Social Needs  . Financial resource strain: Not on file  . Food insecurity:    Worry: Not on file    Inability: Not on file  . Transportation needs:    Medical: Not on file    Non-medical: Not on file  Tobacco Use  . Smoking status: Former Smoker    Last attempt to quit: 06/01/1998    Years since quitting: 19.6  . Smokeless tobacco: Never Used  Substance and Sexual Activity  . Alcohol use: No    Alcohol/week: 0.0 standard drinks  . Drug use: Yes    Frequency: 2.0 times per week    Comment: marijuana two time monthly gives him an appetite  . Sexual activity: Not on file  Lifestyle  . Physical activity:    Days per week: Not on file    Minutes per session: Not on file  . Stress: Not on file  Relationships  . Social connections:    Talks on phone: Not on file    Gets together: Not on file    Attends religious service: Not on file    Active member of club or organization: Not on file    Attends meetings of clubs or organizations: Not on file    Relationship status: Not on file  . Intimate partner violence:    Fear of current or ex partner: Not on file    Emotionally abused: Not on file     Physically abused: Not on file    Forced sexual activity: Not on file  Other Topics Concern  . Not on file  Social History Narrative  . Not on file    FAMILY HISTORY: Family History  Problem Relation Age of Onset  . Hypertension Mother   . Stroke Father   . Kidney disease Neg Hx   . Prostate cancer Neg Hx   . Kidney cancer Neg Hx   . Bladder Cancer Neg Hx     ALLERGIES:  has No Known Allergies.  MEDICATIONS:  Current Outpatient Medications  Medication Sig Dispense Refill  . apalutamide (ERLEADA) 60 MG tablet Take 4 tablets (240 mg total) by mouth daily. May be taken with or without food. 120 tablet 2  . atenolol (TENORMIN) 100 MG tablet Take 100 mg by mouth daily.     . Calcium Carb-Cholecalciferol (CALCIUM-VITAMIN D) 500-200 MG-UNIT tablet Take 1 tablet by mouth daily. 90 tablet 0  . dutasteride (AVODART) 0.5 MG capsule Take 1 capsule (0.5 mg total) by mouth daily. Sunol  capsule 0  . finasteride (PROSCAR) 5 MG tablet Take 1 tablet (5 mg total) by mouth daily. 90 tablet 3  . hydrocortisone 2.5 % cream APP EXT AA BID  0  . mirabegron ER (MYRBETRIQ) 25 MG TB24 tablet Take 1 tablet (25 mg total) by mouth daily. 90 tablet 3  . nortriptyline (PAMELOR) 25 MG capsule Take 25 mg by mouth at bedtime. Reported on 05/16/2015  1  . nystatin (MYCOSTATIN) 100000 UNIT/ML suspension SWISH WITH 1 TEASPOONFUL BY MOUTH AND THEN SWALLOW. USE 4 TIMES A...  (REFER TO PRESCRIPTION NOTES).  0  . omeprazole (PRILOSEC) 20 MG capsule Take 20 mg by mouth daily.    Marland Kitchen pyridOXINE (VITAMIN B-6) 50 MG tablet Take 50 mg by mouth daily.     . tamsulosin (FLOMAX) 0.4 MG CAPS capsule Take 1 capsule (0.4 mg total) by mouth daily. 90 capsule 3  . Telmisartan-Amlodipine 80-5 MG TABS Take 1 tablet by mouth daily.     . megestrol (MEGACE) 40 MG tablet Take 1 tablet (40 mg total) by mouth daily. 30 tablet 0  . oxyCODONE (ROXICODONE) 5 MG immediate release tablet Take 1 tablet (5 mg total) by mouth every 6 (six) hours as  needed for severe pain. 20 tablet 0   No current facility-administered medications for this visit.       Marland Kitchen  PHYSICAL EXAMINATION: ECOG PERFORMANCE STATUS: 1 - Symptomatic but completely ambulatory Vitals:   01/13/18 1412  BP: 124/74  Pulse: 78  Resp: 16   Filed Weights   01/13/18 1412  Weight: 186 lb (84.4 kg)   Physical Exam  Constitutional: He is oriented to person, place, and time. He appears well-developed and well-nourished. No distress.  HENT:  Head: Normocephalic and atraumatic.  Right Ear: External ear normal.  Left Ear: External ear normal.  Mouth/Throat: Oropharynx is clear and moist. No oropharyngeal exudate.  Eyes: Pupils are equal, round, and reactive to light. Conjunctivae and EOM are normal. No scleral icterus.  Neck: Normal range of motion. Neck supple. No JVD present. No thyromegaly present.  Cardiovascular: Normal rate, regular rhythm and normal heart sounds.  No murmur heard. Pulmonary/Chest: Effort normal and breath sounds normal. No stridor. No respiratory distress. He has no wheezes. He has no rales. He exhibits no tenderness.  Abdominal: Soft. Bowel sounds are normal. He exhibits no distension and no mass. There is no tenderness. There is no guarding.  Musculoskeletal: Normal range of motion. He exhibits no edema or deformity.  Lymphadenopathy:    He has no cervical adenopathy.  Neurological: He is alert and oriented to person, place, and time. He displays normal reflexes. No cranial nerve deficit. Coordination normal.  Skin: Skin is warm and dry. No rash noted. He is not diaphoretic. No erythema.  Psychiatric: He has a normal mood and affect. His behavior is normal. Thought content normal.   LABORATORY DATA:  I have reviewed the data as listed Lab Results  Component Value Date   WBC 6.8 01/13/2018   HGB 13.6 01/13/2018   HCT 39.9 (L) 01/13/2018   MCV 84.3 01/13/2018   PLT 203 01/13/2018   Recent Labs    12/16/17 1334 12/30/17 1131  12/31/17 1326 01/10/18 1200 01/13/18 1315  NA 140 142 138 141 140  K 4.0 3.9 3.9 4.4 4.1  CL 110 110 107 109 106  CO2 22 21* 23 22 25   GLUCOSE 101* 145* 105* 183* 119*  BUN 10 13 11 12 12   CREATININE 1.00 1.38* 1.19 1.09  1.21  CALCIUM 9.5 9.8 9.7 9.0 10.1  GFRNONAA >60 45* 54* 60* 53*  GFRAA >60 52* >60 >60 >60  PROT 7.3 8.0  --   --  8.1  ALBUMIN 3.9 4.1  --   --  4.4  AST 25 33  --   --  29  ALT 14 16  --   --  16  ALKPHOS 50 51  --   --  52  BILITOT 1.1 1.1  --   --  0.9   RADIOGRAPHIC STUDIES: I have personally reviewed the radiological images as listed and agreed with the findings in the report. 12/15/2017 Bone scan whole body showed new sites of osseous tracer accumulation are identified especially to size at the distal left femoral diaphysis, L2 vertebral body, right iliac bone, right ischium.  ASSESSMENT & PLAN: 84 yo male follows up for management of metastatic prostate cancer .  1. Dizziness and giddiness   2. Black stool   3. Prostate cancer ([place])   4. Elevated PSA   5. 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 Patient previously cannot tolerate Xtandi.  PSA continued to rise on Abiateron and apalutamide.  Plan obtaining CT scanning.  If none visceral metastasis, will refer to radiation oncology for [facility] evaluation.  #Black stool, hemoglobin has been stable.  Check stool occult blood x 3.  Not interested in colonoscopy. #Poor appetite, start trial of Megace. #Worsening back pain, secondary to neoplasm.  Will give trial of oxycodone 5 mg every 6 hours as needed severe pain.  Discussed the potential side effects and treatment/ prevention  of constipation,  respiratory depression,  and mental status changes. #Fatigue and dizziness, questionable secondary to cancer progression versus side effects from apalutamide.  Since apalutamide has not been effective controlling his prostate cancer, I advised  patient to hold apalutamide for a few days to see if there is any difference.  He voices understanding.   Orders Placed This Encounter  Procedures  . Occult blood card to lab, stool  . Occult blood card to lab, stool    Standing Status:   Future    Standing Expiration Date:   01/14/2019  . Occult blood card to lab, stool    Standing Status:   Future    Standing Expiration Date:   01/14/2019  . Occult blood card to lab, stool    Standing Status:   Future    Standing Expiration Date:   01/14/2019   All questions were answered. The patient knows to call the clinic with any problems questions or concerns.  Return of visit: Follow-up to be determined depending on CT scan next week.   Earlie Server, MD, PhD Hematology Oncology [facility] Pager- [phone] 01/13/2018

## 2018-01-18 ENCOUNTER — Ambulatory Visit
Admission: RE | Admit: 2018-01-18 | Discharge: 2018-01-18 | Disposition: A | Discharge status: Home / Self Care | Payer: Medicare Other | Source: Ambulatory Visit | Attending: Nurse Practitioner | Admitting: Nurse Practitioner

## 2018-01-18 DIAGNOSIS — C61 Malignant neoplasm of prostate: Secondary | ICD-10-CM | POA: Insufficient documentation

## 2018-01-18 DIAGNOSIS — C7951 Secondary malignant neoplasm of bone: Secondary | ICD-10-CM | POA: Insufficient documentation

## 2018-01-18 MED ORDER — IOPAMIDOL (ISOVUE-300) INJECTION 61%
100.0000 mL | Freq: Once | INTRAVENOUS | Status: AC | PRN
  Administered 2018-01-18: 100 mL via INTRAVENOUS

## 2018-01-20 DIAGNOSIS — Z221 Carrier of other intestinal infectious diseases: Secondary | ICD-10-CM | POA: Insufficient documentation

## 2018-01-23 DIAGNOSIS — C61 Malignant neoplasm of prostate: Secondary | ICD-10-CM | POA: Diagnosis not present

## 2018-01-24 DIAGNOSIS — C61 Malignant neoplasm of prostate: Secondary | ICD-10-CM | POA: Diagnosis not present

## 2018-01-25 ENCOUNTER — Other Ambulatory Visit: Payer: Self-pay

## 2018-01-25 DIAGNOSIS — C61 Malignant neoplasm of prostate: Secondary | ICD-10-CM | POA: Diagnosis not present

## 2018-01-25 DIAGNOSIS — K921 Melena: Secondary | ICD-10-CM

## 2018-01-25 LAB — OCCULT BLOOD X 1 CARD TO LAB, STOOL
Fecal Occult Bld: NEGATIVE
Fecal Occult Bld: NEGATIVE
Fecal Occult Bld: NEGATIVE

## 2018-01-26 ENCOUNTER — Ambulatory Visit
Admission: RE | Admit: 2018-01-26 | Discharge: 2018-01-26 | Disposition: A | Discharge status: Home / Self Care | Payer: Medicare Other | Source: Ambulatory Visit | Attending: Radiation Oncology | Admitting: Radiation Oncology

## 2018-01-26 ENCOUNTER — Encounter: Payer: Self-pay | Admitting: Radiation Oncology

## 2018-01-26 ENCOUNTER — Other Ambulatory Visit: Payer: Self-pay

## 2018-01-26 VITALS — BP 126/77 | HR 76 | Temp 96.7°F | Resp 16 | Wt 185.2 lb

## 2018-01-26 DIAGNOSIS — Z192 Hormone resistant malignancy status: Secondary | ICD-10-CM | POA: Insufficient documentation

## 2018-01-26 DIAGNOSIS — Z923 Personal history of irradiation: Secondary | ICD-10-CM | POA: Diagnosis not present

## 2018-01-26 DIAGNOSIS — C61 Malignant neoplasm of prostate: Secondary | ICD-10-CM | POA: Insufficient documentation

## 2018-01-26 DIAGNOSIS — C7951 Secondary malignant neoplasm of bone: Secondary | ICD-10-CM | POA: Diagnosis present

## 2018-01-26 NOTE — Progress Notes (Signed)
Radiation Oncology Follow up Note Old patient new area progressive bone metastases  Name: Matthew Hunter   Date:   01/26/2018 MRN:  [id number] DOB: 10-01-1929    This 84 y.o. male presents to the clinic today for progressive bone metastases patient with castrate resistant prostate cancer stage IV.  REFERRING PROVIDER: Maryland Pink, MD  HPI: patient is a 84 year old male well known to our department previously treated 7 months prior with palliative radiation therapy to his lumbar spine for metastatic involvement of stage IV prostate cancer. His initial tumor was a Gleason 9 (4+5) adenocarcinoma. Patient has received treatment with [name] as well as Gillermina Phy which was discontinued secondary to CNS toxicity in April 2019.most recently has been treated withApalutimide 240mg  daily.he now has progressive back pain and recent bone scan shows widespread progression of osseous metastasis.CT scan shows no evidence of organ involvement but again confirms widespread osseous metastasis. He is seen today for consideration of Xofigo.Blood counts are stablehe's having no problems with ambulation no focal neurologic deficits.  COMPLICATIONS OF TREATMENT: none  FOLLOW UP COMPLIANCE: keeps appointments   PHYSICAL EXAM:  BP 126/77 (BP Location: Left Arm, Patient Position: Sitting)   Pulse 76   Temp (!) 96.7 F (35.9 C) (Tympanic)   Resp 16   Wt 185 lb 3 oz (84 kg)   BMI 27.35 kg/m  Range of motion of his lower extremities does not elicit pain motor sensory and DTR levels are equal and symmetric in the upper lower extremities. Deep palpation of the spine does not elicit pain.Well-developed well-nourished patient in NAD. HEENT reveals PERLA, EOMI, discs not visualized.  Oral cavity is clear. No oral mucosal lesions are identified. Neck is clear without evidence of cervical or supraclavicular adenopathy. Lungs are clear to A&P. Cardiac examination is essentially unremarkable with regular rate and rhythm without  murmur rub or thrill. Abdomen is benign with no organomegaly or masses noted. Motor sensory and DTR levels are equal and symmetric in the upper and lower extremities. Cranial nerves II through XII are grossly intact. Proprioception is intact. No peripheral adenopathy or edema is identified. No motor or sensory levels are noted. Crude visual fields are within normal range.  RADIOLOGY RESULTS: bone scan and CT scan reviewed and compatible above-stated findings  PLAN: at this time would like to start Xofigowith 6 infusions over 6 months.Risks and benefits of treatment including possible marrow suppression allergic reaction nausea vomiting diarrhea all were described in detail to the patient and his wife. I will arrange with nuclear medicine to start his treatments. Patient wife both seem to compress and her treatment plan well.  I would like to take this opportunity to thank you for allowing me to participate in the care of your patient.Noreene Filbert, MD

## 2018-02-08 ENCOUNTER — Ambulatory Visit: Payer: Medicare Other | Admitting: Oncology

## 2018-02-08 ENCOUNTER — Other Ambulatory Visit: Payer: Medicare Other

## 2018-02-10 ENCOUNTER — Other Ambulatory Visit: Payer: Self-pay | Admitting: *Deleted

## 2018-02-10 ENCOUNTER — Other Ambulatory Visit: Payer: Self-pay | Admitting: Radiation Oncology

## 2018-02-10 DIAGNOSIS — C61 Malignant neoplasm of prostate: Secondary | ICD-10-CM

## 2018-02-10 DIAGNOSIS — C7951 Secondary malignant neoplasm of bone: Principal | ICD-10-CM

## 2018-02-14 ENCOUNTER — Inpatient Hospital Stay: Payer: Medicare Other

## 2018-02-14 ENCOUNTER — Other Ambulatory Visit: Payer: Self-pay

## 2018-02-14 ENCOUNTER — Inpatient Hospital Stay: Payer: Medicare Other | Attending: Oncology | Admitting: Oncology

## 2018-02-14 ENCOUNTER — Encounter: Payer: Self-pay | Admitting: Oncology

## 2018-02-14 VITALS — BP 123/73 | HR 66 | Temp 96.0°F | Resp 18 | Wt 184.1 lb

## 2018-02-14 DIAGNOSIS — Z7982 Long term (current) use of aspirin: Secondary | ICD-10-CM | POA: Insufficient documentation

## 2018-02-14 DIAGNOSIS — C7951 Secondary malignant neoplasm of bone: Secondary | ICD-10-CM

## 2018-02-14 DIAGNOSIS — I2510 Atherosclerotic heart disease of native coronary artery without angina pectoris: Secondary | ICD-10-CM | POA: Diagnosis not present

## 2018-02-14 DIAGNOSIS — L299 Pruritus, unspecified: Secondary | ICD-10-CM | POA: Insufficient documentation

## 2018-02-14 DIAGNOSIS — Z79899 Other long term (current) drug therapy: Secondary | ICD-10-CM | POA: Insufficient documentation

## 2018-02-14 DIAGNOSIS — R42 Dizziness and giddiness: Secondary | ICD-10-CM | POA: Insufficient documentation

## 2018-02-14 DIAGNOSIS — R5383 Other fatigue: Secondary | ICD-10-CM

## 2018-02-14 DIAGNOSIS — Z87891 Personal history of nicotine dependence: Secondary | ICD-10-CM | POA: Insufficient documentation

## 2018-02-14 DIAGNOSIS — R630 Anorexia: Secondary | ICD-10-CM | POA: Insufficient documentation

## 2018-02-14 DIAGNOSIS — G893 Neoplasm related pain (acute) (chronic): Secondary | ICD-10-CM | POA: Insufficient documentation

## 2018-02-14 DIAGNOSIS — I10 Essential (primary) hypertension: Secondary | ICD-10-CM | POA: Insufficient documentation

## 2018-02-14 DIAGNOSIS — K219 Gastro-esophageal reflux disease without esophagitis: Secondary | ICD-10-CM | POA: Diagnosis not present

## 2018-02-14 DIAGNOSIS — R634 Abnormal weight loss: Secondary | ICD-10-CM | POA: Insufficient documentation

## 2018-02-14 DIAGNOSIS — K921 Melena: Secondary | ICD-10-CM | POA: Insufficient documentation

## 2018-02-14 DIAGNOSIS — C61 Malignant neoplasm of prostate: Secondary | ICD-10-CM | POA: Diagnosis present

## 2018-02-14 DIAGNOSIS — M1990 Unspecified osteoarthritis, unspecified site: Secondary | ICD-10-CM | POA: Insufficient documentation

## 2018-02-14 LAB — CBC WITH DIFFERENTIAL/PLATELET
BASOS ABS: 0 10*3/uL (ref 0–0.1)
Basophils Relative: 0 %
Eosinophils Absolute: 0.1 10*3/uL (ref 0–0.7)
Eosinophils Relative: 2 %
HEMATOCRIT: 35.5 % — AB (ref 40.0–52.0)
HEMOGLOBIN: 11.9 g/dL — AB (ref 13.0–18.0)
LYMPHS PCT: 21 %
Lymphs Abs: 1.2 10*3/uL (ref 1.0–3.6)
MCH: 28.7 pg (ref 26.0–34.0)
MCHC: 33.7 g/dL (ref 32.0–36.0)
MCV: 85.3 fL (ref 80.0–100.0)
Monocytes Absolute: 0.5 10*3/uL (ref 0.2–1.0)
Monocytes Relative: 10 %
NEUTROS ABS: 3.7 10*3/uL (ref 1.4–6.5)
Neutrophils Relative %: 67 %
Platelets: 179 10*3/uL (ref 150–440)
RBC: 4.16 MIL/uL — AB (ref 4.40–5.90)
RDW: 14.5 % (ref 11.5–14.5)
WBC: 5.6 10*3/uL (ref 3.8–10.6)

## 2018-02-14 MED ORDER — ENZALUTAMIDE 40 MG PO CAPS: 120.0000 mg | ORAL_CAPSULE | Freq: Every day | ORAL | 0 refills | Status: DC

## 2018-02-14 MED ORDER — OXYCODONE HCL 5 MG PO TABS: 5.0000 mg | ORAL_TABLET | Freq: Four times a day (QID) | ORAL | 0 refills | Status: DC | PRN

## 2018-02-14 MED ORDER — OXYCODONE HCL ER 10 MG PO T12A: 10.0000 mg | EXTENDED_RELEASE_TABLET | Freq: Two times a day (BID) | ORAL | 0 refills | Status: DC

## 2018-02-14 MED ORDER — DRONABINOL 5 MG PO CAPS: 5.0000 mg | ORAL_CAPSULE | Freq: Two times a day (BID) | ORAL | 0 refills | Status: DC

## 2018-02-14 NOTE — Progress Notes (Signed)
Hematology/Oncology  Follow up note [facility] [phone] [phone]  Patient Care Team: [place] Pink, MD as PCP - General (Family Medicine) Earlie Server, MD as Medical Oncologist (Medical Oncology)  REASON FOR VISIT Follow up for antineoplasm treatment of Prostate Cancer  HISTORY OF PRESENTING ILLNESS:  Matthew Hunter 84 y.o.  male with PMH listed as below who was referred by [facility] Urology Provider [name] to me for evaluation of clinical prostate cancer and management.  He has a history of elevated PSA and BPH. PSA trends as follows: 11/14/2014 4.3, 11/07/2015 7.1, 05/11/2016 10.4, 06/11/2016 10.6, 07/07/2016 7.8, 01/06/2017 86.8.  He was seen by urology and was diagnosed with clinical prostate cancer given the extreme elevation of PSA and prostate nodule. Biopsy was not pursued. He was treated with [name] on 01/29/2017,  # prostate biopsy on 03/10/2017. 5 out of 6 cores positive for prostate cancer, with highest gleason score group 9 (4+5), + perineural invasion.  Stage IV prostate cancer, multiple osseus metastatic disease. No visceral involvement.   Current treatment  Patient got first Firmagon on 01/29/2018, switched to leupron on 04/13/2015,  Q6 months.  .Testosterone level at 14, castration level.  Upfront Gillermina Phy was added on December 2019 to assist fast disease control (phase II trial [place] B, [facility] Oncol. 2014) S/p palliative RT to lumber spine and SI joints. Xtandi discontinued on due to CNS toxicity in April 2019.  12/09/17 Abiaterone discontinued.  7/ 18/2019 Start Apalutimide 240mg  daily.   INTERVAL HISTORY Patient presents to follow up for management of metastatic prostate cancer.  PSA 86.8 -->64.78 -->131--> 242 added Xtanti --> 17.74-->1.82 Xtandi stopped--> 1.79- (added Abiaterone)-> 11.06-->24.53--> 39.48 [Abiaterone stopped, Added Apalutimid]--> 65.71-->  #He was started on apalutamide on 12/16/2017 however came back  reporting feeling dizziness, going to blackout.  Also lost appetite and feeling very fatigued. Apalutamide was held.  #Today patient also report worsening of his lower back pain, more on the left side.  He reports oxycodone 5 mg is not controlling his pain well.  Reports that he has not had a good night sleep for couple of days due to the back pain.  Denies any lower extremity focal weakness, saddle numbness, incontinence.  No aggravating or alleviating factors. Pain is 6 out of 10  sharp, not associated with lower extremity focal weakness, saddle numbness, incontinence.  No aggravating or alleviating factors.  #Fatigue: reports worsening fatigue. Chronic onset, perisistent, no aggravating or improving factors, no associated symptoms.  #Weight loss he has lost 2 pounds for the past 4 weeks.  Reports not eating very well.  Lack of appetite.  ROS:  Review of Systems  Constitutional: Positive for fatigue and unexpected weight change. Negative for appetite change, chills, diaphoresis and fever.  HENT:   Negative for hearing loss, lump/mass, mouth sores, nosebleeds and sore throat.   Eyes: Negative for eye problems and icterus.  Respiratory: Negative for chest tightness, cough, hemoptysis, shortness of breath and wheezing.   Cardiovascular: Negative for chest pain and leg swelling.  Gastrointestinal: Negative for abdominal distention, abdominal pain, blood in stool, diarrhea, nausea and rectal pain.  Endocrine: Negative for hot flashes.  Genitourinary: Positive for nocturia. Negative for bladder incontinence, difficulty urinating, dysuria, frequency and hematuria.   Musculoskeletal: Positive for back pain. Negative for arthralgias, flank pain, gait problem and myalgias.  Skin: Negative for itching and rash.  Neurological: Negative for dizziness, gait problem, headaches, light-headedness, numbness and seizures.  Hematological: Negative for adenopathy. Does not bruise/bleed easily.  Psychiatric/Behavioral: Negative for confusion, decreased concentration, depression and sleep disturbance. The patient is not nervous/anxious.     MEDICAL HISTORY:  Past Medical History:  Diagnosis Date  . Arthritis   . Benign enlargement of prostate   . CAD (coronary artery disease)   . Constipation   . Elevated PSA   . GERD (gastroesophageal reflux disease)   . Hypertension   . Incomplete bladder emptying   . Nocturia   . Prostate cancer ([place]) 05/03/2017   Rad tx's and Lupron shots.   . Urgency of micturation   . Urinary frequency     SURGICAL HISTORY: Past Surgical History:  Procedure Laterality Date  . GALLBLADDER SURGERY    . PROSTATE SURGERY    . STOMACH SURGERY      SOCIAL HISTORY: Social History   Socioeconomic History  . Marital status: Married    Spouse name: Not on file  . Number of children: Not on file  . Years of education: Not on file  . Highest education level: Not on file  Occupational History  . Not on file  Social Needs  . Financial resource strain: Not on file  . Food insecurity:    Worry: Not on file    Inability: Not on file  . Transportation needs:    Medical: Not on file    Non-medical: Not on file  Tobacco Use  . Smoking status: Former Smoker    Last attempt to quit: 06/01/1998    Years since quitting: 19.7  . Smokeless tobacco: Never Used  Substance and Sexual Activity  . Alcohol use: No    Alcohol/week: 0.0 standard drinks  . Drug use: Yes    Frequency: 2.0 times per week    Comment: marijuana two time monthly gives him an appetite  . Sexual activity: Not on file  Lifestyle  . Physical activity:    Days per week: Not on file    Minutes per session: Not on file  . Stress: Not on file  Relationships  . Social connections:    Talks on phone: Not on file    Gets together: Not on file    Attends religious service: Not on file    Active member of club or organization: Not on file    Attends meetings of clubs or organizations:  Not on file    Relationship status: Not on file  . Intimate partner violence:    Fear of current or ex partner: Not on file    Emotionally abused: Not on file    Physically abused: Not on file    Forced sexual activity: Not on file  Other Topics Concern  . Not on file  Social History Narrative  . Not on file    FAMILY HISTORY: Family History  Problem Relation Age of Onset  . Hypertension Mother   . Stroke Father   . Kidney disease Neg Hx   . Prostate cancer Neg Hx   . Kidney cancer Neg Hx   . Bladder Cancer Neg Hx     ALLERGIES:  has No Known Allergies.  MEDICATIONS:  Current Outpatient Medications  Medication Sig Dispense Refill  . apalutamide (ERLEADA) 60 MG tablet Take 4 tablets (240 mg total) by mouth daily. May be taken with or without food. 120 tablet 2  . atenolol (TENORMIN) 100 MG tablet Take 100 mg by mouth daily.     . Calcium Carb-Cholecalciferol (CALCIUM-VITAMIN D) 500-200 MG-UNIT tablet Take 1 tablet by mouth daily. 90 tablet 0  .  dutasteride (AVODART) 0.5 MG capsule Take 1 capsule (0.5 mg total) by mouth daily. 30 capsule 0  . finasteride (PROSCAR) 5 MG tablet Take 1 tablet (5 mg total) by mouth daily. 90 tablet 3  . hydrocortisone 2.5 % cream APP EXT AA BID  0  . mirabegron ER (MYRBETRIQ) 25 MG TB24 tablet Take 1 tablet (25 mg total) by mouth daily. 90 tablet 3  . nortriptyline (PAMELOR) 25 MG capsule Take 25 mg by mouth at bedtime. Reported on 05/16/2015  1  . nystatin (MYCOSTATIN) 100000 UNIT/ML suspension SWISH WITH 1 TEASPOONFUL BY MOUTH AND THEN SWALLOW. USE 4 TIMES A...  (REFER TO PRESCRIPTION NOTES).  0  . omeprazole (PRILOSEC) 20 MG capsule Take 20 mg by mouth daily.    Marland Kitchen pyridOXINE (VITAMIN B-6) 50 MG tablet Take 50 mg by mouth daily.     . tamsulosin (FLOMAX) 0.4 MG CAPS capsule Take 1 capsule (0.4 mg total) by mouth daily. 90 capsule 3  . Telmisartan-Amlodipine 80-5 MG TABS Take 1 tablet by mouth daily.     . megestrol (MEGACE) 40 MG tablet Take 1  tablet (40 mg total) by mouth daily. (Patient not taking: Reported on 02/14/2018) 30 tablet 0  . oxyCODONE (ROXICODONE) 5 MG immediate release tablet Take 1 tablet (5 mg total) by mouth every 6 (six) hours as needed for severe pain. (Patient not taking: Reported on 02/14/2018) 20 tablet 0   No current facility-administered medications for this visit.       Marland Kitchen  PHYSICAL EXAMINATION: ECOG PERFORMANCE STATUS: 1 - Symptomatic but completely ambulatory Vitals:   02/14/18 1256  BP: 123/73  Pulse: 66  Resp: 18  Temp: (!) 96 F (35.6 C)   Filed Weights   02/14/18 1256  Weight: 184 lb 1.6 oz (83.5 kg)   Physical Exam  Constitutional: He is oriented to person, place, and time. No distress.  HENT:  Head: Normocephalic and atraumatic.  Right Ear: External ear normal.  Left Ear: External ear normal.  Mouth/Throat: Oropharynx is clear and moist. No oropharyngeal exudate.  Eyes: Pupils are equal, round, and reactive to light. Conjunctivae and EOM are normal. No scleral icterus.  Neck: Normal range of motion. Neck supple. No JVD present. No thyromegaly present.  Cardiovascular: Normal rate, regular rhythm and normal heart sounds.  No murmur heard. Pulmonary/Chest: Effort normal and breath sounds normal. No stridor. No respiratory distress. He has no wheezes. He has no rales. He exhibits no tenderness.  Abdominal: Soft. Bowel sounds are normal. He exhibits no distension and no mass. There is no tenderness. There is no guarding.  Musculoskeletal: Normal range of motion. He exhibits no edema or deformity.  Lymphadenopathy:    He has no cervical adenopathy.  Neurological: He is alert and oriented to person, place, and time. He displays normal reflexes. No cranial nerve deficit. Coordination normal.  Skin: Skin is warm and dry. No rash noted. He is not diaphoretic. No erythema.  Psychiatric: He has a normal mood and affect. His behavior is normal. Thought content normal.   LABORATORY DATA:  I  have reviewed the data as listed Lab Results  Component Value Date   WBC 5.6 02/14/2018   HGB 11.9 (L) 02/14/2018   HCT 35.5 (L) 02/14/2018   MCV 85.3 02/14/2018   PLT 179 02/14/2018   Recent Labs    12/16/17 1334 12/30/17 1131 12/31/17 1326 01/10/18 1200 01/13/18 1315  NA 140 142 138 141 140  K 4.0 3.9 3.9 4.4 4.1  CL  110 110 107 109 106  CO2 22 21* 23 22 25   GLUCOSE 101* 145* 105* 183* 119*  BUN 10 13 11 12 12   CREATININE 1.00 1.38* 1.19 1.09 1.21  CALCIUM 9.5 9.8 9.7 9.0 10.1  GFRNONAA >60 45* 54* 60* 53*  GFRAA >60 52* >60 >60 >60  PROT 7.3 8.0  --   --  8.1  ALBUMIN 3.9 4.1  --   --  4.4  AST 25 33  --   --  29  ALT 14 16  --   --  16  ALKPHOS 50 51  --   --  52  BILITOT 1.1 1.1  --   --  0.9   RADIOGRAPHIC STUDIES: I have personally reviewed the radiological images as listed and agreed with the findings in the report. 12/15/2017 Bone scan whole body showed new sites of osseous tracer accumulation are identified especially to size at the distal left femoral diaphysis, L2 vertebral body, right iliac bone, right ischium. 01/18/2018 chest abdomen pelvis with contrast Extensive metastasis disease to bone, with all bone lesions new from the abdomen and pelvis CT dated 9/5 2018 and a chest CT dated 06/19/2013.  Involvement is similar to the recent bone scan.  No other evidence of metastatic disease.  No acute abnormalities in the chest abdomen and pelvis.  ASSESSMENT & PLAN: 84 yo male follows up for management of metastatic prostate cancer .  1. Prostate cancer ([place])   2. Bone metastasis ([place])   3. Neoplasm related pain   4. Black stools   5. Weight loss    #Castration resistant prostate cancer, PSA continued to rise despite starting apalutamide. CT scan image was independently reviewed by me and discussed with patient.  Patient has progression of bone metastatic disease only. Proceed with Xofigo treatment.  His first treatment will be 9/25 2019 Patient previously felt  cannot tolerate Xtandi as it is causing mental fogginess.  PSA continued to rise despite being on Abiateron or apalutamide. Discussed with patient and I recommend patient to take a reduced dose of Xtandi 120 mg daily.  He reports he is to have some Xtandi supply from previous treatment.   #Black stool, hemoglobin has been stable.   stool occult blood negative x 3.  Not interested in colonoscopy. #Weight loss /poor appetite, low-dose mag is not helpful.  Will switch to Marinol 5 mg twice daily. #Worsening back pain, secondary to neoplasm.  Prescribe pain OxyContin 10 mg twice daily.  Refilled oxycodone 5 mg every 6 hours as needed.  Instructions were discussed in details with patient and his wife.    Orders Placed This Encounter  Procedures  . CBC with Differential/Platelet    Standing Status:   Future    Standing Expiration Date:   02/15/2019  . Comprehensive metabolic panel    Standing Status:   Future    Standing Expiration Date:   02/15/2019  . PSA    Standing Status:   Future    Standing Expiration Date:   02/15/2019  We spent sufficient time to discuss many aspect of care, questions were answered to patient's satisfaction. The patient knows to call the clinic with any problems questions or concerns.  Return of visit: 2 weeks for reassessment of his symptoms.  Earlie Server, MD, PhD Hematology Oncology [facility] Pager- [phone] 02/14/2018

## 2018-02-14 NOTE — Progress Notes (Signed)
Patient here for follow up. C/o pain 8/10 to back. He has been using a back brace even when sleeping. Pain medication not helping.

## 2018-02-16 ENCOUNTER — Telehealth: Payer: Self-pay | Admitting: *Deleted

## 2018-02-16 NOTE — Telephone Encounter (Signed)
Received prior authorization request for oxyCODONE (OXYCONTIN) 10 mg 12 hr tablet  &  dronabinol (MARINOL) 5 MG capsule.   Called to complete PA.    oxyCODONE (OXYCONTIN) 10 mg 12 hr tablet under review.  dronabinol (MARINOL) 5 MG capsule approved.

## 2018-02-17 ENCOUNTER — Telehealth: Payer: Self-pay | Admitting: *Deleted

## 2018-02-17 ENCOUNTER — Encounter: Payer: Self-pay | Admitting: *Deleted

## 2018-02-17 ENCOUNTER — Inpatient Hospital Stay: Payer: Medicare Other

## 2018-02-17 ENCOUNTER — Other Ambulatory Visit: Payer: Self-pay | Admitting: Oncology

## 2018-02-17 DIAGNOSIS — C7951 Secondary malignant neoplasm of bone: Secondary | ICD-10-CM

## 2018-02-17 DIAGNOSIS — C61 Malignant neoplasm of prostate: Secondary | ICD-10-CM | POA: Diagnosis not present

## 2018-02-17 LAB — CBC WITH DIFFERENTIAL/PLATELET
Basophils Absolute: 0 10*3/uL (ref 0–0.1)
Basophils Relative: 1 %
EOS PCT: 2 %
Eosinophils Absolute: 0.1 10*3/uL (ref 0–0.7)
HEMATOCRIT: 35.7 % — AB (ref 40.0–52.0)
Hemoglobin: 12.1 g/dL — ABNORMAL LOW (ref 13.0–18.0)
LYMPHS ABS: 1.1 10*3/uL (ref 1.0–3.6)
LYMPHS PCT: 15 %
MCH: 28.9 pg (ref 26.0–34.0)
MCHC: 34 g/dL (ref 32.0–36.0)
MCV: 85.1 fL (ref 80.0–100.0)
MONO ABS: 0.6 10*3/uL (ref 0.2–1.0)
Monocytes Relative: 8 %
NEUTROS ABS: 5.4 10*3/uL (ref 1.4–6.5)
Neutrophils Relative %: 74 %
PLATELETS: 178 10*3/uL (ref 150–440)
RBC: 4.19 MIL/uL — ABNORMAL LOW (ref 4.40–5.90)
RDW: 14.1 % (ref 11.5–14.5)
WBC: 7.2 10*3/uL (ref 3.8–10.6)

## 2018-02-17 MED ORDER — FENTANYL 25 MCG/HR TD PT72: 25.0000 ug | MEDICATED_PATCH | TRANSDERMAL | 0 refills | Status: DC

## 2018-02-17 NOTE — Telephone Encounter (Signed)
PA request for Oxycodone has been denied.    Dr Tasia Catchings notified and will be sending new rx for Fentanyl patches.    Notified pt's wife this information.

## 2018-02-17 NOTE — Telephone Encounter (Signed)
I completed a Prior Authorization for this yesterday, waiting for insurance to review and notify us of their decision.

## 2018-02-17 NOTE — Telephone Encounter (Signed)
Patient called to report that his pharmacy would not fill his prescription for Oxycontin.

## 2018-02-18 NOTE — Telephone Encounter (Signed)
Called patient's wife to correct information on prior authorization per Geraldine Solar CMA. Medication was denied. Wife verbalized understanding.

## 2018-02-18 NOTE — Telephone Encounter (Signed)
Called patient to inform him of prior authorization being completed. Spoke with wife. She verbalized understanding of process.

## 2018-02-23 ENCOUNTER — Ambulatory Visit: Payer: Medicare Other | Admitting: Radiation Oncology

## 2018-02-23 ENCOUNTER — Ambulatory Visit
Admission: RE | Admit: 2018-02-23 | Discharge: 2018-02-23 | Disposition: A | Discharge status: Home / Self Care | Payer: Medicare Other | Source: Ambulatory Visit | Attending: Radiation Oncology | Admitting: Radiation Oncology

## 2018-02-23 DIAGNOSIS — Z510 Encounter for antineoplastic radiation therapy: Secondary | ICD-10-CM | POA: Diagnosis not present

## 2018-02-23 DIAGNOSIS — C61 Malignant neoplasm of prostate: Secondary | ICD-10-CM | POA: Diagnosis present

## 2018-02-23 DIAGNOSIS — C7951 Secondary malignant neoplasm of bone: Secondary | ICD-10-CM | POA: Diagnosis not present

## 2018-02-23 MED ORDER — RADIUM RA 223 DICHLORIDE 30 MCCI/ML IV SOLN
124.0000 | Freq: Once | INTRAVENOUS | Status: AC
  Administered 2018-02-23: 124.932 via INTRAVENOUS

## 2018-02-23 NOTE — Progress Notes (Signed)
Radiation Oncology Follow up Note  Name: Matthew Hunter   Date:   02/23/2018 MRN:  [id number] DOB: Dec 22, 1929    This 84 y.o. male presents to the uclear medicine for first Xofigoinfusion  REFERRING PROVIDER: Noreene Filbert, MD  HPI: patient is an 84 year old male with hormone refractory stage IV prostate cancer.He is seen in  Today for firstXofigo infusion he is doing well although still complains of marked back pain..  COMPLICATIONS OF TREATMENT: none  FOLLOW UP COMPLIANCE: keeps appointments   PHYSICAL EXAM:  There were no vitals taken for this visit. Well-developed well-nourished patient in NAD. HEENT reveals PERLA, EOMI, discs not visualized.  Oral cavity is clear. No oral mucosal lesions are identified. Neck is clear without evidence of cervical or supraclavicular adenopathy. Lungs are clear to A&P. Cardiac examination is essentially unremarkable with regular rate and rhythm without murmur rub or thrill. Abdomen is benign with no organomegaly or masses noted. Motor sensory and DTR levels are equal and symmetric in the upper and lower extremities. Cranial nerves II through XII are grossly intact. Proprioception is intact. No peripheral adenopathy or edema is identified. No motor or sensory levels are noted. Crude visual fields are within normal range.  RADIOLOGY RESULTS: no current films for review  PLAN: Peripheral line was started on the patient and 20 cc of normal saline were passed to make sure there was patency of the lines. Trudi Ida was administered over a 5 minute infusion push by nuclear medicine technologist supervised by radiation oncologist. After completion of IV push of Xofigo 30 cc an additional saline were passed through the peripheral line. Oral lines syringes drapes and original container of Xofigo were then taken to nuclear medicine for storage. Patient tolerated the procedure well without side effect or complaint. Patient has anti-emetic medication. Have scheduled the  patient for a three-week followup to check on his counts. Patient is to call sooner with any side effects or complaints.    Noreene Filbert, MD

## 2018-02-26 ENCOUNTER — Emergency Department: Payer: Medicare Other

## 2018-02-26 ENCOUNTER — Other Ambulatory Visit: Payer: Self-pay

## 2018-02-26 ENCOUNTER — Emergency Department
Admission: EM | Admit: 2018-02-26 | Discharge: 2018-02-26 | Disposition: A | Discharge status: Home / Self Care | Payer: Medicare Other | Attending: Student in an Organized Health Care Education/Training Program | Admitting: Student in an Organized Health Care Education/Training Program

## 2018-02-26 ENCOUNTER — Encounter: Payer: Self-pay | Admitting: Emergency Medicine

## 2018-02-26 DIAGNOSIS — Z87891 Personal history of nicotine dependence: Secondary | ICD-10-CM | POA: Insufficient documentation

## 2018-02-26 DIAGNOSIS — R0789 Other chest pain: Secondary | ICD-10-CM | POA: Diagnosis not present

## 2018-02-26 DIAGNOSIS — Z79899 Other long term (current) drug therapy: Secondary | ICD-10-CM | POA: Insufficient documentation

## 2018-02-26 DIAGNOSIS — I10 Essential (primary) hypertension: Secondary | ICD-10-CM | POA: Diagnosis not present

## 2018-02-26 DIAGNOSIS — Z8546 Personal history of malignant neoplasm of prostate: Secondary | ICD-10-CM | POA: Diagnosis not present

## 2018-02-26 DIAGNOSIS — I2510 Atherosclerotic heart disease of native coronary artery without angina pectoris: Secondary | ICD-10-CM | POA: Insufficient documentation

## 2018-02-26 DIAGNOSIS — R079 Chest pain, unspecified: Secondary | ICD-10-CM | POA: Diagnosis present

## 2018-02-26 LAB — BASIC METABOLIC PANEL
Anion gap: 8 (ref 5–15)
BUN: 14 mg/dL (ref 8–23)
CHLORIDE: 106 mmol/L (ref 98–111)
CO2: 23 mmol/L (ref 22–32)
Calcium: 9.3 mg/dL (ref 8.9–10.3)
Creatinine, Ser: 1.1 mg/dL (ref 0.61–1.24)
GFR calc Af Amer: >60 mL/min (ref >60)
GFR calc non Af Amer: 59 mL/min — ABNORMAL LOW (ref >60)
GLUCOSE: 100 mg/dL — AB (ref 70–99)
POTASSIUM: 4.3 mmol/L (ref 3.5–5.1)
Sodium: 137 mmol/L (ref 135–145)

## 2018-02-26 LAB — CBC
HCT: 33.8 % — ABNORMAL LOW (ref 40.0–52.0)
HEMOGLOBIN: 12.2 g/dL — AB (ref 13.0–18.0)
MCH: 30.6 pg (ref 26.0–34.0)
MCHC: 36.1 g/dL — ABNORMAL HIGH (ref 32.0–36.0)
MCV: 84.7 fL (ref 80.0–100.0)
Platelets: 207 10*3/uL (ref 150–440)
RBC: 3.98 MIL/uL — ABNORMAL LOW (ref 4.40–5.90)
RDW: 13.6 % (ref 11.5–14.5)
WBC: 7.9 10*3/uL (ref 3.8–10.6)

## 2018-02-26 LAB — TROPONIN I
Troponin I: <0.03 ng/mL (ref <0.03)
Troponin I: <0.03 ng/mL (ref <0.03)

## 2018-02-26 MED ORDER — HYDROCODONE-ACETAMINOPHEN 5-325 MG PO TABS
1.0000 | ORAL_TABLET | Freq: Once | ORAL | Status: AC
  Administered 2018-02-26: 1 via ORAL
  Filled 2018-02-26: qty 1

## 2018-02-26 MED ORDER — IOHEXOL 350 MG/ML SOLN
75.0000 mL | Freq: Once | INTRAVENOUS | Status: AC | PRN
  Administered 2018-02-26: 75 mL via INTRAVENOUS

## 2018-02-26 MED ORDER — SODIUM CHLORIDE 0.9 % IV BOLUS
500.0000 mL | Freq: Once | INTRAVENOUS | Status: AC
  Administered 2018-02-26: 500 mL via INTRAVENOUS

## 2018-02-26 MED ORDER — POLYETHYLENE GLYCOL 3350 17 G PO PACK: 17.0000 g | PACK | Freq: Every day | ORAL | 0 refills | Status: DC

## 2018-02-26 MED ORDER — HYDROCODONE-ACETAMINOPHEN 5-325 MG PO TABS: 1.0000 | ORAL_TABLET | ORAL | 0 refills | Status: DC | PRN

## 2018-02-26 NOTE — Discharge Instructions (Addendum)
IMPRESSION: 1. No pulmonary embolism. 2. New subsolid 1.3 cm right lower lobe pulmonary nodule, favor inflammatory given the relatively rapid appearance, although metastasis cannot be excluded. Recommend attention on follow-up chest CT in 3 months. 3. No appreciable change in widespread sclerotic osseous metastases throughout the thoracic skeleton. 4. One vessel coronary atherosclerosis.

## 2018-02-26 NOTE — ED Provider Notes (Signed)
[facility] Emergency Department Provider Note    First MD Initiated Contact with Patient 02/26/18 1650     (approximate)  I have reviewed the triage vital signs and the nursing notes.   HISTORY  Chief Complaint Chest Pain    HPI Matthew Hunter is a 84 y.o. male with past medical history as listed below presents the ER for intermittent chest pain for the past 1-1/37months.  States that today he was having worsening left-sided chest pain as well as dizziness and lightheadedness particular when standing or walking.  Has had decreased appetite but feels that he is been drinking plenty of fluids.  Denies any nausea or vomiting.  States he has some achiness in his left arm.  Denies any heavy lifting.  No numbness or tingling.  Is never had pain like this before.  Is not on any blood thinners.  No previous history of DVT or PE.    Past Medical History:  Diagnosis Date  . Arthritis   . Benign enlargement of prostate   . CAD (coronary artery disease)   . Constipation   . Elevated PSA   . GERD (gastroesophageal reflux disease)   . Hypertension   . Incomplete bladder emptying   . Nocturia   . Prostate cancer ([place]) 05/03/2017   Rad tx's and Lupron shots.   . Urgency of micturation   . Urinary frequency    Family History  Problem Relation Age of Onset  . Hypertension Mother   . Stroke Father   . Kidney disease Neg Hx   . Prostate cancer Neg Hx   . Kidney cancer Neg Hx   . Bladder Cancer Neg Hx    Past Surgical History:  Procedure Laterality Date  . GALLBLADDER SURGERY    . PROSTATE SURGERY    . STOMACH SURGERY     Patient Active Problem List   Diagnosis Date Noted  . 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 Goals of care, counseling/discussion 12/17/2017  . Prostate cancer ([place]) 05/03/2017  . Arthritis 06/11/2016  . History of alcohol abuse 06/11/2016  . Hx of colonic polyp 06/11/2016  . Hypertension 06/11/2016  . Internal hemorrhoids  06/11/2016  . Elevated PSA 05/16/2015  . BPH (benign prostatic hyperplasia) 11/22/2014  . Incomplete bladder emptying 11/22/2014  . GERD (gastroesophageal reflux disease) 05/09/2008      Prior to Admission medications   Medication Sig Start Date End Date Taking? Authorizing Provider  atenolol (TENORMIN) 100 MG tablet Take 100 mg by mouth daily.     [provider]  Calcium Carb-Cholecalciferol (CALCIUM-VITAMIN D) 500-200 MG-UNIT tablet Take 1 tablet by mouth daily. 12/30/17   Earlie Server, MD  dronabinol (MARINOL) 5 MG capsule Take 1 capsule (5 mg total) by mouth 2 (two) times daily before a meal. 02/14/18   Earlie Server, MD  dutasteride (AVODART) 0.5 MG capsule Take 1 capsule (0.5 mg total) by mouth daily. 06/25/16   McGowan, Hunt Oris, PA-C  enzalutamide (XTANDI) 40 MG capsule Take 3 capsules (120 mg total) by mouth daily. 02/14/18   Earlie Server, MD  fentaNYL (DURAGESIC - DOSED MCG/HR) 25 MCG/HR patch Place 1 patch (25 mcg total) onto the skin every 3 (three) days. 02/17/18   Earlie Server, MD  finasteride (PROSCAR) 5 MG tablet Take 1 tablet (5 mg total) by mouth daily. 06/11/16   McGowan, Hunt Oris, PA-C  HYDROcodone-acetaminophen (NORCO) 5-325 MG tablet Take 1 tablet by mouth every 4 (four) hours as needed for moderate pain. 02/26/18  Merlyn Lot, MD  hydrocortisone 2.5 % cream APP EXT AA BID 09/02/17   [provider]  mirabegron ER (MYRBETRIQ) 25 MG TB24 tablet Take 1 tablet (25 mg total) by mouth daily. 05/04/17   [name] A, PA-C  nortriptyline (PAMELOR) 25 MG capsule Take 25 mg by mouth at bedtime. Reported on 05/16/2015 10/30/14   [provider]  nystatin (MYCOSTATIN) 100000 UNIT/ML suspension SWISH WITH 1 TEASPOONFUL BY MOUTH AND THEN SWALLOW. USE 4 TIMES A...  (REFER TO PRESCRIPTION NOTES). 05/05/17   [provider]  omeprazole (PRILOSEC) 20 MG capsule Take 20 mg by mouth daily.    [provider]  oxyCODONE (ROXICODONE) 5 MG immediate release  tablet Take 1 tablet (5 mg total) by mouth every 6 (six) hours as needed for severe pain. 02/14/18   Earlie Server, MD  polyethylene glycol [facility] / Floria Raveling) packet Take 17 g by mouth daily. Mix one tablespoon with 8oz of your favorite juice or water every day until you are having soft formed stools. Then start taking once daily if you didn't have a stool the day before. 02/26/18   Merlyn Lot, MD  pyridOXINE (VITAMIN B-6) 50 MG tablet Take 50 mg by mouth daily.     [provider]  tamsulosin (FLOMAX) 0.4 MG CAPS capsule Take 1 capsule (0.4 mg total) by mouth daily. 07/19/17   McGowan, Larene Beach A, PA-C  Telmisartan-Amlodipine 80-5 MG TABS Take 1 tablet by mouth daily.     [provider]    Allergies Patient has no known allergies.    Social History Social History   Tobacco Use  . Smoking status: Former Smoker    Last attempt to quit: 06/01/1998    Years since quitting: 19.7  . Smokeless tobacco: Never Used  Substance Use Topics  . Alcohol use: No    Alcohol/week: 0.0 standard drinks  . Drug use: Not Currently    Frequency: 2.0 times per week    Types: Marijuana    Comment: marijuana two time monthly gives him an appetite    Review of Systems Patient denies headaches, rhinorrhea, blurry vision, numbness, shortness of breath, chest pain, edema, cough, abdominal pain, nausea, vomiting, diarrhea, dysuria, fevers, rashes or hallucinations unless otherwise stated above in HPI. ____________________________________________   PHYSICAL EXAM:  VITAL SIGNS: Vitals:   02/26/18 1729 02/26/18 1834  BP: 139/73 128/74  Pulse: 71 71  Resp: 18 18  Temp:    SpO2: 99% 100%    Constitutional: Alert and oriented.  Eyes: Conjunctivae are normal.  Head: Atraumatic. Nose: No congestion/rhinnorhea. Mouth/Throat: Mucous membranes are moist.   Neck: No stridor. Painless ROM.  Cardiovascular: Normal rate, regular rhythm. Grossly normal heart sounds.  Good peripheral  circulation. Respiratory: Normal respiratory effort.  No retractions. Lungs CTAB. Gastrointestinal: Soft and nontender. No distention. No abdominal bruits. No CVA tenderness. Genitourinary:  Musculoskeletal: No lower extremity tenderness nor edema.  No joint effusions. Neurologic:  Normal speech and language. No gross focal neurologic deficits are appreciated. No facial droop Skin:  Skin is warm, dry and intact. No rash noted. Psychiatric: Mood and affect are normal. Speech and behavior are normal.  ____________________________________________   LABS (all labs ordered are listed, but only abnormal results are displayed)  Results for orders placed or performed during the hospital encounter of 02/26/18 (from the past 24 hour(s))  Basic metabolic panel     Status: Abnormal   Collection Time: 02/26/18  1:09 PM  Result Value Ref Range   Sodium  137 135 - 145 mmol/L   Potassium 4.3 3.5 - 5.1 mmol/L   Chloride 106 98 - 111 mmol/L   CO2 23 22 - 32 mmol/L   Glucose, Bld 100 (H) 70 - 99 mg/dL   BUN 14 8 - 23 mg/dL   Creatinine, Ser 1.10 0.61 - 1.24 mg/dL   Calcium 9.3 8.9 - 10.3 mg/dL   GFR calc non Af Amer 59 (L) >60 mL/min   GFR calc Af Amer >60 >60 mL/min   Anion gap 8 5 - 15  CBC     Status: Abnormal   Collection Time: 02/26/18  1:09 PM  Result Value Ref Range   WBC 7.9 3.8 - 10.6 K/uL   RBC 3.98 (L) 4.40 - 5.90 MIL/uL   Hemoglobin 12.2 (L) 13.0 - 18.0 g/dL   HCT 33.8 (L) 40.0 - 52.0 %   MCV 84.7 80.0 - 100.0 fL   MCH 30.6 26.0 - 34.0 pg   MCHC 36.1 (H) 32.0 - 36.0 g/dL   RDW 13.6 11.5 - 14.5 %   Platelets 207 150 - 440 K/uL  Troponin I     Status: None   Collection Time: 02/26/18  1:09 PM  Result Value Ref Range   Troponin I <0.03 <0.03 ng/mL  Troponin I     Status: None   Collection Time: 02/26/18  6:38 PM  Result Value Ref Range   Troponin I <0.03 <0.03 ng/mL   ____________________________________________  EKG My review and personal interpretation at Time: 12:58     Indication: chestpain  Rate: 70  Rhythm: sinus Axis: normal Other: normal intervals, no stemi ____________________________________________  RADIOLOGY  I personally reviewed all radiographic images ordered to evaluate for the above acute complaints and reviewed radiology reports and findings.  These findings were personally discussed with the patient.  Please see medical record for radiology report.  ____________________________________________   PROCEDURES  Procedure(s) performed:  Procedures    Critical Care performed: no ____________________________________________   INITIAL IMPRESSION / ASSESSMENT AND PLAN / ED COURSE  Pertinent labs & imaging results that were available during my care of the patient were reviewed by me and considered in my medical decision making (see chart for details).   DDX: ACS, pericarditis, esophagitis, boerhaaves, pe, dissection, pna, bronchitis, costochondritis    Andrue Dini is a 84 y.o. who presents to the ED with symptoms as described above.  Patient nontoxic-appearing is afebrile Heema dynamically stable.  As he does have a history of active cancer will order CT angiogram to evaluate for PE but also evaluate for metastatic disease.  EKG shows no evidence of acute ischemia and initial troponin is negative.  Based on his age will order repeat troponin to further risk stratify however seems clinically unlikely to be ACS.  Will provide pain medication as well as IV fluids.  Clinical Course as of Feb 27 1927  Sat Feb 26, 2018  0017 CT without evidence of PE.  There is new pulmonary nodule which will need close outpatient follow-up and this is discussed with patient.  Will repeat troponin.   [PR]  1928 Repeat troponin negative.  Patient stable and appropriate for outpatient follow-up.  Is currently pain-free.   [PR]    Clinical Course User Index [PR] Merlyn Lot, MD     As part of my medical decision making, I reviewed the following  data within the [place] notes reviewed and incorporated, Labs reviewed, notes from prior ED visits.   ____________________________________________  FINAL CLINICAL IMPRESSION(S) / ED DIAGNOSES  Final diagnoses:  Atypical chest pain      NEW MEDICATIONS STARTED DURING THIS VISIT:  New Prescriptions   HYDROCODONE-ACETAMINOPHEN (NORCO) 5-325 MG TABLET    Take 1 tablet by mouth every 4 (four) hours as needed for moderate pain.   POLYETHYLENE GLYCOL (MIRALAX / GLYCOLAX) PACKET    Take 17 g by mouth daily. Mix one tablespoon with 8oz of your favorite juice or water every day until you are having soft formed stools. Then start taking once daily if you didn't have a stool the day before.     Note:  This document was prepared using Dragon voice recognition software and may include unintentional dictation errors.    Merlyn Lot, MD 02/26/18 Kathyrn Drown

## 2018-02-26 NOTE — ED Notes (Signed)
Urine collected and sent.

## 2018-02-26 NOTE — ED Triage Notes (Signed)
Pt presents from [facility] with cp x 1.5 months, dizziness that began this morning when standing or walking. Pt reports loss of appetite, has been drinking fluids. Pt alert & oriented with NAD noted.

## 2018-02-28 ENCOUNTER — Inpatient Hospital Stay: Payer: Medicare Other

## 2018-02-28 ENCOUNTER — Inpatient Hospital Stay (HOSPITAL_BASED_OUTPATIENT_CLINIC_OR_DEPARTMENT_OTHER): Payer: Medicare Other | Admitting: Oncology

## 2018-02-28 ENCOUNTER — Encounter: Payer: Self-pay | Admitting: Oncology

## 2018-02-28 ENCOUNTER — Other Ambulatory Visit: Payer: Self-pay

## 2018-02-28 VITALS — BP 127/75 | HR 67 | Temp 97.5°F | Resp 18 | Wt 179.4 lb

## 2018-02-28 DIAGNOSIS — C7951 Secondary malignant neoplasm of bone: Secondary | ICD-10-CM

## 2018-02-28 DIAGNOSIS — R634 Abnormal weight loss: Secondary | ICD-10-CM

## 2018-02-28 DIAGNOSIS — I2510 Atherosclerotic heart disease of native coronary artery without angina pectoris: Secondary | ICD-10-CM

## 2018-02-28 DIAGNOSIS — M1990 Unspecified osteoarthritis, unspecified site: Secondary | ICD-10-CM

## 2018-02-28 DIAGNOSIS — I10 Essential (primary) hypertension: Secondary | ICD-10-CM

## 2018-02-28 DIAGNOSIS — L299 Pruritus, unspecified: Secondary | ICD-10-CM

## 2018-02-28 DIAGNOSIS — G893 Neoplasm related pain (acute) (chronic): Secondary | ICD-10-CM

## 2018-02-28 DIAGNOSIS — C61 Malignant neoplasm of prostate: Secondary | ICD-10-CM | POA: Diagnosis not present

## 2018-02-28 DIAGNOSIS — Z79899 Other long term (current) drug therapy: Secondary | ICD-10-CM

## 2018-02-28 DIAGNOSIS — R630 Anorexia: Secondary | ICD-10-CM

## 2018-02-28 DIAGNOSIS — R42 Dizziness and giddiness: Secondary | ICD-10-CM

## 2018-02-28 DIAGNOSIS — R5383 Other fatigue: Secondary | ICD-10-CM

## 2018-02-28 DIAGNOSIS — K219 Gastro-esophageal reflux disease without esophagitis: Secondary | ICD-10-CM

## 2018-02-28 DIAGNOSIS — Z7982 Long term (current) use of aspirin: Secondary | ICD-10-CM

## 2018-02-28 DIAGNOSIS — Z87891 Personal history of nicotine dependence: Secondary | ICD-10-CM

## 2018-02-28 LAB — CBC WITH DIFFERENTIAL/PLATELET
BASOS ABS: 0 10*3/uL (ref 0–0.1)
Basophils Relative: 0 %
EOS PCT: 1 %
Eosinophils Absolute: 0.1 10*3/uL (ref 0–0.7)
HCT: 34.5 % — ABNORMAL LOW (ref 40.0–52.0)
Hemoglobin: 11.6 g/dL — ABNORMAL LOW (ref 13.0–18.0)
LYMPHS PCT: 14 %
Lymphs Abs: 0.8 10*3/uL — ABNORMAL LOW (ref 1.0–3.6)
MCH: 28.7 pg (ref 26.0–34.0)
MCHC: 33.6 g/dL (ref 32.0–36.0)
MCV: 85.4 fL (ref 80.0–100.0)
Monocytes Absolute: 0.5 10*3/uL (ref 0.2–1.0)
Monocytes Relative: 8 %
NEUTROS PCT: 77 %
Neutro Abs: 4.4 10*3/uL (ref 1.4–6.5)
PLATELETS: 222 10*3/uL (ref 150–440)
RBC: 4.04 MIL/uL — AB (ref 4.40–5.90)
RDW: 14 % (ref 11.5–14.5)
WBC: 5.7 10*3/uL (ref 3.8–10.6)

## 2018-02-28 LAB — COMPREHENSIVE METABOLIC PANEL
ALT: 16 U/L (ref 0–44)
AST: 27 U/L (ref 15–41)
Albumin: 3.9 g/dL (ref 3.5–5.0)
Alkaline Phosphatase: 40 U/L (ref 38–126)
Anion gap: 7 (ref 5–15)
BUN: 14 mg/dL (ref 8–23)
CHLORIDE: 106 mmol/L (ref 98–111)
CO2: 22 mmol/L (ref 22–32)
CREATININE: 1.16 mg/dL (ref 0.61–1.24)
Calcium: 9.4 mg/dL (ref 8.9–10.3)
GFR calc Af Amer: >60 mL/min (ref >60)
GFR calc non Af Amer: 55 mL/min — ABNORMAL LOW (ref >60)
Glucose, Bld: 109 mg/dL — ABNORMAL HIGH (ref 70–99)
Potassium: 3.9 mmol/L (ref 3.5–5.1)
Sodium: 135 mmol/L (ref 135–145)
Total Bilirubin: 1.5 mg/dL — ABNORMAL HIGH (ref 0.3–1.2)
Total Protein: 7.9 g/dL (ref 6.5–8.1)

## 2018-02-28 MED ORDER — DENOSUMAB 120 MG/1.7ML ~~LOC~~ SOLN
120.0000 mg | Freq: Once | SUBCUTANEOUS | Status: AC
  Administered 2018-02-28: 120 mg via SUBCUTANEOUS
  Filled 2018-02-28: qty 1.7

## 2018-02-28 MED ORDER — HYDROXYZINE HCL 10 MG PO TABS: 10.0000 mg | ORAL_TABLET | Freq: Three times a day (TID) | ORAL | 0 refills | Status: DC | PRN

## 2018-02-28 NOTE — Progress Notes (Signed)
Patient here for follow up. States he is ont taking xtandi because it makes him itch.

## 2018-02-28 NOTE — Progress Notes (Signed)
Hematology/Oncology  Follow up note [facility] Telephone:([phone]) [phone] [phone]  Patient Care Team: [place] Pink, MD as PCP - General (Family Medicine) Earlie Server, MD as Medical Oncologist (Medical Oncology)  REASON FOR VISIT Follow up for antineoplasm treatment of Prostate Cancer  HISTORY OF PRESENTING ILLNESS:  Matthew Hunter 84 y.o.  male with PMH listed as below who was referred by [facility] Urology Provider [name] to me for evaluation of clinical prostate cancer and management.  He has a history of elevated PSA and BPH. PSA trends as follows: 11/14/2014 4.3, 11/07/2015 7.1, 05/11/2016 10.4, 06/11/2016 10.6, 07/07/2016 7.8, 01/06/2017 86.8.  He was seen by urology and was diagnosed with clinical prostate cancer given the extreme elevation of PSA and prostate nodule. Biopsy was not pursued. He was treated with [name] on 01/29/2017,  # prostate biopsy on 03/10/2017. 5 out of 6 cores positive for prostate cancer, with highest gleason score group 9 (4+5), + perineural invasion.  Stage IV prostate cancer, multiple osseus metastatic disease. No visceral involvement.   01/18/2018  CT scan image was independently reviewed by me and discussed with patient.  Patient has progression of bone metastatic disease only  Current treatment  Patient got first Sinclair on 01/29/2018, switched to leupron on 04/13/2015,  Q6 months.  .Testosterone level at 14, castration level.  Upfront Gillermina Phy was added on December 2019 to assist fast disease control (phase II trial [place] B, [facility] Oncol. 2014) S/p palliative RT to lumber spine and SI joints. Xtandi discontinued on due to CNS toxicity in April 2019.  12/09/17 Abiaterone discontinued.  7/ 18/2019 Start Apalutimide 240mg  daily.   INTERVAL HISTORY Patient presents to follow up for management of metastatic prostate cancer.  PSA 86.8 -->64.78 -->131--> 242 added Xtanti --> 17.74-->1.82 Xtandi stopped--> 1.79- (added  Abiaterone)-> 11.06-->24.53--> 39.48 [Abiaterone stopped, Added Apalutimid]--> 65.71--> Xtandi discontinued on due to CNS toxicity in April 2019.  12/09/17 Abiaterone discontinued due to rising PSA and disease progression. 12/16/2017 Start Apalutimide 240mg  daily. Held on 01/13/2018 due to rising PSA, also causing patient to feel dizziness/black out.   INTERVAL HISTORY Matthew Hunter is a 84 y.o. male who has above history reviewed by me today presents for follow up visit for management of  Problems and complaints are listed below:  #Today patient was accompanied by wife and daughter.  Continues to feel tired and fatigued. Ongoing weight loss with poor appetite despite taking Marinol 5 mg twice daily Status post Xofigo treatment on 02/23/2018. During the interval patient had a ER visit 3 days after Xofigo infusion due to intermittent left chest pain radiated to left arm. He had a negative cardiology work-up troponin was negative.  CT angiogram of the chest was done to rule out PE.  There was no PE but a new pulmonary nodule 1.3 cm warrants close follow-up. Patient was advised to go back on Xtandi reduced dose 120mg  daily. He reports that he tried 2 doses and found to have severe body pruritus after taking Xtandi.  So he stopped taking the medication. Not associated with any skin rash.    ROS:  Review of Systems  Constitutional: Positive for fatigue and unexpected weight change. Negative for appetite change, chills, diaphoresis and fever.  HENT:   Negative for hearing loss, lump/mass, mouth sores, nosebleeds and sore throat.   Eyes: Negative for eye problems and icterus.  Respiratory: Negative for chest tightness, cough, hemoptysis, shortness of breath and wheezing.   Cardiovascular: Negative for chest pain and leg swelling.  Gastrointestinal: Negative for abdominal distention, abdominal pain, blood in stool, diarrhea, nausea and rectal pain.  Endocrine: Negative for hot flashes.    Genitourinary: Positive for nocturia. Negative for bladder incontinence, difficulty urinating, dysuria, frequency and hematuria.   Musculoskeletal: Positive for back pain. Negative for arthralgias, flank pain, gait problem and myalgias.  Skin: Negative for itching and rash.  Neurological: Negative for dizziness, gait problem, headaches, light-headedness, numbness and seizures.  Hematological: Negative for adenopathy. Does not bruise/bleed easily.  Psychiatric/Behavioral: Negative for confusion, decreased concentration, depression and sleep disturbance. The patient is not nervous/anxious.     MEDICAL HISTORY:  Past Medical History:  Diagnosis Date  . Arthritis   . Benign enlargement of prostate   . CAD (coronary artery disease)   . Constipation   . Elevated PSA   . GERD (gastroesophageal reflux disease)   . Hypertension   . Incomplete bladder emptying   . Nocturia   . Prostate cancer ([place]) 05/03/2017   Rad tx's and Lupron shots.   . Urgency of micturation   . Urinary frequency     SURGICAL HISTORY: Past Surgical History:  Procedure Laterality Date  . GALLBLADDER SURGERY    . PROSTATE SURGERY    . STOMACH SURGERY      SOCIAL HISTORY: Social History   Socioeconomic History  . Marital status: Married    Spouse name: Not on file  . Number of children: Not on file  . Years of education: Not on file  . Highest education level: Not on file  Occupational History  . Not on file  Social Needs  . Financial resource strain: Not on file  . Food insecurity:    Worry: Not on file    Inability: Not on file  . Transportation needs:    Medical: Not on file    Non-medical: Not on file  Tobacco Use  . Smoking status: Former Smoker    Last attempt to quit: 06/01/1998    Years since quitting: 19.7  . Smokeless tobacco: Never Used  Substance and Sexual Activity  . Alcohol use: No    Alcohol/week: 0.0 standard drinks  . Drug use: Not Currently    Frequency: 2.0 times per week     Types: Marijuana    Comment: marijuana two time monthly gives him an appetite  . Sexual activity: Not on file  Lifestyle  . Physical activity:    Days per week: Not on file    Minutes per session: Not on file  . Stress: Not on file  Relationships  . Social connections:    Talks on phone: Not on file    Gets together: Not on file    Attends religious service: Not on file    Active member of club or organization: Not on file    Attends meetings of clubs or organizations: Not on file    Relationship status: Not on file  . Intimate partner violence:    Fear of current or ex partner: Not on file    Emotionally abused: Not on file    Physically abused: Not on file    Forced sexual activity: Not on file  Other Topics Concern  . Not on file  Social History Narrative  . Not on file    FAMILY HISTORY: Family History  Problem Relation Age of Onset  . Hypertension Mother   . Stroke Father   . Kidney disease Neg Hx   . Prostate cancer Neg Hx   . Kidney cancer Neg Hx   .  Bladder Cancer Neg Hx     ALLERGIES:  has No Known Allergies.  MEDICATIONS:  Current Outpatient Medications  Medication Sig Dispense Refill  . aspirin EC 81 MG tablet Take by mouth.    Marland Kitchen atenolol (TENORMIN) 100 MG tablet Take 100 mg by mouth daily.     . Calcium Carb-Cholecalciferol (CALCIUM-VITAMIN D) 500-200 MG-UNIT tablet Take 1 tablet by mouth daily. 90 tablet 0  . dronabinol (MARINOL) 5 MG capsule Take 1 capsule (5 mg total) by mouth 2 (two) times daily before a meal. 60 capsule 0  . dutasteride (AVODART) 0.5 MG capsule Take 1 capsule (0.5 mg total) by mouth daily. 30 capsule 0  . fentaNYL (DURAGESIC - DOSED MCG/HR) 25 MCG/HR patch Place 1 patch (25 mcg total) onto the skin every 3 (three) days. 5 patch 0  . finasteride (PROSCAR) 5 MG tablet Take 1 tablet (5 mg total) by mouth daily. 90 tablet 3  . HYDROcodone-acetaminophen (NORCO) 5-325 MG tablet Take 1 tablet by mouth every 4 (four) hours as needed for  moderate pain. 6 tablet 0  . hydrocortisone 2.5 % cream APP EXT AA BID  0  . mirabegron ER (MYRBETRIQ) 25 MG TB24 tablet Take 1 tablet (25 mg total) by mouth daily. 90 tablet 3  . nortriptyline (PAMELOR) 25 MG capsule Take 25 mg by mouth at bedtime. Reported on 05/16/2015  1  . nystatin (MYCOSTATIN) 100000 UNIT/ML suspension SWISH WITH 1 TEASPOONFUL BY MOUTH AND THEN SWALLOW. USE 4 TIMES A...  (REFER TO PRESCRIPTION NOTES).  0  . omeprazole (PRILOSEC) 20 MG capsule Take 20 mg by mouth daily.    Marland Kitchen oxyCODONE (ROXICODONE) 5 MG immediate release tablet Take 1 tablet (5 mg total) by mouth every 6 (six) hours as needed for severe pain. 30 tablet 0  . polyethylene glycol (MIRALAX / GLYCOLAX) packet Take 17 g by mouth daily. Mix one tablespoon with 8oz of your favorite juice or water every day until you are having soft formed stools. Then start taking once daily if you didn't have a stool the day before. 30 each 0  . pyridOXINE (VITAMIN B-6) 50 MG tablet Take 50 mg by mouth daily.     . tamsulosin (FLOMAX) 0.4 MG CAPS capsule Take 1 capsule (0.4 mg total) by mouth daily. 90 capsule 3  . Telmisartan-Amlodipine 80-5 MG TABS Take 1 tablet by mouth daily.     . enzalutamide (XTANDI) 40 MG capsule Take 3 capsules (120 mg total) by mouth daily. (Patient not taking: Reported on 02/28/2018) 42 capsule 0  . hydrOXYzine (ATARAX/VISTARIL) 10 MG tablet Take 1 tablet (10 mg total) by mouth 3 (three) times daily as needed for itching. 30 tablet 0   No current facility-administered medications for this visit.       Marland Kitchen  PHYSICAL EXAMINATION: ECOG PERFORMANCE STATUS: 1 - Symptomatic but completely ambulatory Vitals:   02/28/18 1406  BP: 127/75  Pulse: 67  Resp: 18  Temp: (!) 97.5 F (36.4 C)   Filed Weights   02/28/18 1406  Weight: 179 lb 6.4 oz (81.4 kg)   Physical Exam  Constitutional: He is oriented to person, place, and time. No distress.  HENT:  Head: Normocephalic and atraumatic.  Mouth/Throat:  Oropharynx is clear and moist. No oropharyngeal exudate.  Eyes: Pupils are equal, round, and reactive to light. Conjunctivae and EOM are normal. No scleral icterus.  Neck: Normal range of motion. Neck supple. No JVD present. No thyromegaly present.  Cardiovascular: Normal rate, regular rhythm and  normal heart sounds.  No murmur heard. Pulmonary/Chest: Effort normal. No stridor. No respiratory distress. He has no wheezes. He has no rales. He exhibits no tenderness.  Abdominal: Soft. Bowel sounds are normal. He exhibits no distension and no mass. There is no tenderness. There is no guarding.  Musculoskeletal: Normal range of motion. He exhibits no edema or deformity.  Lymphadenopathy:    He has no cervical adenopathy.  Neurological: He is alert and oriented to person, place, and time. He displays normal reflexes. No cranial nerve deficit. Coordination normal.  Skin: Skin is warm and dry. No rash noted. He is not diaphoretic. No erythema.  Psychiatric: He has a normal mood and affect. His behavior is normal. Thought content normal.   LABORATORY DATA:  I have reviewed the data as listed Lab Results  Component Value Date   WBC 5.7 02/28/2018   HGB 11.6 (L) 02/28/2018   HCT 34.5 (L) 02/28/2018   MCV 85.4 02/28/2018   PLT 222 02/28/2018   Recent Labs    12/30/17 1131  01/13/18 1315 02/26/18 1309 02/28/18 1342  NA 142   < > 140 137 135  K 3.9   < > 4.1 4.3 3.9  CL 110   < > 106 106 106  CO2 21*   < > 25 23 22   GLUCOSE 145*   < > 119* 100* 109*  BUN 13   < > 12 14 14   CREATININE 1.38*   < > 1.21 1.10 1.16  CALCIUM 9.8   < > 10.1 9.3 9.4  GFRNONAA 45*   < > 53* 59* 55*  GFRAA 52*   < > >60 >60 >60  PROT 8.0  --  8.1  --  7.9  ALBUMIN 4.1  --  4.4  --  3.9  AST 33  --  29  --  27  ALT 16  --  16  --  16  ALKPHOS 51  --  52  --  40  BILITOT 1.1  --  0.9  --  1.5*   < > = values in this interval not displayed.   RADIOGRAPHIC STUDIES: I have personally reviewed the radiological  images as listed and agreed with the findings in the report. 12/15/2017 Bone scan whole body showed new sites of osseous tracer accumulation are identified especially to size at the distal left femoral diaphysis, L2 vertebral body, right iliac bone, right ischium. 01/18/2018 chest abdomen pelvis with contrast Extensive metastasis disease to bone, with all bone lesions new from the abdomen and pelvis CT dated 9/5 2018 and a chest CT dated 06/19/2013.  Involvement is similar to the recent bone scan.  No other evidence of metastatic disease.  No acute abnormalities in the chest abdomen and pelvis.  ASSESSMENT & PLAN: 84 yo male follows up for management of metastatic prostate cancer .  1. Prostate cancer ([place])   2. Weight loss   3. Itching   4. Bone metastasis ([place])   5. 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  He has been started on Xofigo treatment.  He has tried [place] however developed Itchiness afterwards.  discussed with patient to check his home Xtandi supply, if expired, will arrange new shipment.  The Etiology of itchiness is unknown. Trial of Atarax PRN if he experiences more pruritis .I ask patient also to call me if he still cannot tolerate reduced dose of Xtandi with Atarax.   Proceed with Xgeva today.  #  Weight loss /poor appetite,continue Marinol 5mg  BID. Refer to Dietitian.  #Black stool, hemoglobin has been stable.   stool occult blood negative x 3.  Not interested in colonoscopy.   Orders Placed This Encounter  Procedures  . Amb Referral to Nutrition and Diabetic E    Referral Priority:   Routine    Referral Type:   Consultation    Referral Reason:   Specialty Services Required    Number of Visits Requested:   1  We spent sufficient time to discuss many aspect of care, questions were answered to patient's satisfaction. The patient knows to call the clinic with any problems questions or concerns.  Return of visit: 4 weeks.    Earlie Server, MD, PhD Hematology Oncology [facility] at [facility] Pager- [phone] 02/28/2018

## 2018-03-01 LAB — PSA: Prostatic Specific Antigen: 215 ng/mL — ABNORMAL HIGH (ref 0.00–4.00)

## 2018-03-03 ENCOUNTER — Telehealth: Payer: Self-pay | Admitting: Pharmacy Technician

## 2018-03-03 ENCOUNTER — Other Ambulatory Visit: Payer: Self-pay | Admitting: *Deleted

## 2018-03-03 ENCOUNTER — Other Ambulatory Visit: Payer: Self-pay | Admitting: Oncology

## 2018-03-03 DIAGNOSIS — C61 Malignant neoplasm of prostate: Secondary | ICD-10-CM

## 2018-03-03 MED ORDER — ENZALUTAMIDE 40 MG PO CAPS: 120.0000 mg | ORAL_CAPSULE | Freq: Every day | ORAL | 0 refills | Status: DC

## 2018-03-03 NOTE — Telephone Encounter (Signed)
Oral Oncology Patient Advocate Encounter  Received notification from [employer] that prior authorization for Gillermina Phy is required.  PA submitted on CoverMyMeds Key [id number] Status is pending  Oral Oncology Clinic will continue to follow.  [place] Patient [place] Phone [phone] Fax [phone] 03/03/2018 3:35 PM

## 2018-03-04 MED ORDER — ENZALUTAMIDE 40 MG PO CAPS: 120.0000 mg | ORAL_CAPSULE | Freq: Every day | ORAL | 0 refills | Status: DC

## 2018-03-04 NOTE — Telephone Encounter (Signed)
Oral Oncology Patient Advocate Encounter   Prior authorization was cancelled because the patient was still enrolled in the patient assistance from earlier this year.  New prescription was sent to [place].  I called and verified they received it.  I called Matthew Hunter and told him to call the pharmacy to set up shipment for his Xtandi.  He agreed and wrote down the number to the pharmacy.    I will continue to follow to make sure patient receives his medication.  [place] Patient [place] Phone [phone] Fax [phone] 03/04/2018 3:38 PM

## 2018-03-04 NOTE — Telephone Encounter (Signed)
Refill was sent to pharmacy on 02/28/18, Spoke with pharmacy, Patient picked up on 02/28/18

## 2018-03-04 NOTE — Addendum Note (Signed)
Addended by: Darl Pikes on: 03/04/2018 09:56 AM   Modules accepted: Orders

## 2018-03-10 NOTE — Telephone Encounter (Signed)
Oral Oncology Patient Advocate Encounter  Received confirmation from [place] that the patient received his shipment of Xtandi on 03/08/18.  [place] Patient [place] Phone [phone] Fax [phone] 03/10/2018 9:58 AM

## 2018-03-14 ENCOUNTER — Other Ambulatory Visit: Payer: Self-pay

## 2018-03-14 ENCOUNTER — Telehealth: Payer: Self-pay | Admitting: *Deleted

## 2018-03-14 ENCOUNTER — Inpatient Hospital Stay: Payer: Medicare Other | Attending: Oncology

## 2018-03-14 ENCOUNTER — Inpatient Hospital Stay (HOSPITAL_BASED_OUTPATIENT_CLINIC_OR_DEPARTMENT_OTHER): Payer: Medicare Other | Admitting: Oncology

## 2018-03-14 ENCOUNTER — Ambulatory Visit
Admission: RE | Admit: 2018-03-14 | Discharge: 2018-03-14 | Disposition: A | Discharge status: Home / Self Care | Payer: Medicare Other | Source: Ambulatory Visit | Attending: Oncology | Admitting: Oncology

## 2018-03-14 ENCOUNTER — Encounter: Payer: Self-pay | Admitting: Oncology

## 2018-03-14 ENCOUNTER — Inpatient Hospital Stay: Payer: Medicare Other

## 2018-03-14 VITALS — BP 149/82 | HR 95 | Temp 98.2°F | Resp 18

## 2018-03-14 DIAGNOSIS — K219 Gastro-esophageal reflux disease without esophagitis: Secondary | ICD-10-CM | POA: Insufficient documentation

## 2018-03-14 DIAGNOSIS — G893 Neoplasm related pain (acute) (chronic): Secondary | ICD-10-CM | POA: Insufficient documentation

## 2018-03-14 DIAGNOSIS — C7951 Secondary malignant neoplasm of bone: Secondary | ICD-10-CM | POA: Diagnosis not present

## 2018-03-14 DIAGNOSIS — R079 Chest pain, unspecified: Secondary | ICD-10-CM

## 2018-03-14 DIAGNOSIS — I700 Atherosclerosis of aorta: Secondary | ICD-10-CM | POA: Insufficient documentation

## 2018-03-14 DIAGNOSIS — C61 Malignant neoplasm of prostate: Secondary | ICD-10-CM | POA: Insufficient documentation

## 2018-03-14 DIAGNOSIS — Z79818 Long term (current) use of other agents affecting estrogen receptors and estrogen levels: Secondary | ICD-10-CM

## 2018-03-14 DIAGNOSIS — Z7982 Long term (current) use of aspirin: Secondary | ICD-10-CM | POA: Insufficient documentation

## 2018-03-14 DIAGNOSIS — M549 Dorsalgia, unspecified: Secondary | ICD-10-CM | POA: Diagnosis not present

## 2018-03-14 DIAGNOSIS — Z923 Personal history of irradiation: Secondary | ICD-10-CM | POA: Diagnosis not present

## 2018-03-14 DIAGNOSIS — M1990 Unspecified osteoarthritis, unspecified site: Secondary | ICD-10-CM | POA: Diagnosis not present

## 2018-03-14 DIAGNOSIS — R091 Pleurisy: Secondary | ICD-10-CM | POA: Diagnosis not present

## 2018-03-14 DIAGNOSIS — Z79899 Other long term (current) drug therapy: Secondary | ICD-10-CM | POA: Insufficient documentation

## 2018-03-14 DIAGNOSIS — D649 Anemia, unspecified: Secondary | ICD-10-CM | POA: Diagnosis not present

## 2018-03-14 DIAGNOSIS — R5381 Other malaise: Secondary | ICD-10-CM | POA: Insufficient documentation

## 2018-03-14 DIAGNOSIS — I2510 Atherosclerotic heart disease of native coronary artery without angina pectoris: Secondary | ICD-10-CM | POA: Insufficient documentation

## 2018-03-14 DIAGNOSIS — R634 Abnormal weight loss: Secondary | ICD-10-CM

## 2018-03-14 DIAGNOSIS — R530 Neoplastic (malignant) related fatigue: Secondary | ICD-10-CM | POA: Diagnosis not present

## 2018-03-14 DIAGNOSIS — Z87891 Personal history of nicotine dependence: Secondary | ICD-10-CM | POA: Diagnosis not present

## 2018-03-14 DIAGNOSIS — R0789 Other chest pain: Secondary | ICD-10-CM | POA: Insufficient documentation

## 2018-03-14 DIAGNOSIS — R351 Nocturia: Secondary | ICD-10-CM | POA: Insufficient documentation

## 2018-03-14 DIAGNOSIS — G4700 Insomnia, unspecified: Secondary | ICD-10-CM

## 2018-03-14 DIAGNOSIS — M79602 Pain in left arm: Secondary | ICD-10-CM | POA: Diagnosis not present

## 2018-03-14 DIAGNOSIS — K921 Melena: Secondary | ICD-10-CM | POA: Insufficient documentation

## 2018-03-14 DIAGNOSIS — I10 Essential (primary) hypertension: Secondary | ICD-10-CM | POA: Diagnosis not present

## 2018-03-14 LAB — TROPONIN I: Troponin I: <0.03 ng/mL (ref <0.03)

## 2018-03-14 LAB — COMPREHENSIVE METABOLIC PANEL
ALT: 15 U/L (ref 0–44)
AST: 27 U/L (ref 15–41)
Albumin: 4.2 g/dL (ref 3.5–5.0)
Alkaline Phosphatase: 44 U/L (ref 38–126)
Anion gap: 8 (ref 5–15)
BUN: 11 mg/dL (ref 8–23)
CHLORIDE: 106 mmol/L (ref 98–111)
CO2: 22 mmol/L (ref 22–32)
CREATININE: 1.11 mg/dL (ref 0.61–1.24)
Calcium: 9.9 mg/dL (ref 8.9–10.3)
GFR calc non Af Amer: 58 mL/min — ABNORMAL LOW (ref >60)
Glucose, Bld: 171 mg/dL — ABNORMAL HIGH (ref 70–99)
Potassium: 4.2 mmol/L (ref 3.5–5.1)
SODIUM: 136 mmol/L (ref 135–145)
Total Bilirubin: 0.8 mg/dL (ref 0.3–1.2)
Total Protein: 8.2 g/dL — ABNORMAL HIGH (ref 6.5–8.1)

## 2018-03-14 LAB — CBC WITH DIFFERENTIAL/PLATELET
ABS IMMATURE GRANULOCYTES: 0.02 10*3/uL (ref 0.00–0.07)
BASOS ABS: 0 10*3/uL (ref 0.0–0.1)
BASOS PCT: 0 %
Eosinophils Absolute: 0.1 10*3/uL (ref 0.0–0.5)
Eosinophils Relative: 1 %
HCT: 35.6 % — ABNORMAL LOW (ref 39.0–52.0)
Hemoglobin: 12 g/dL — ABNORMAL LOW (ref 13.0–17.0)
IMMATURE GRANULOCYTES: 0 %
Lymphocytes Relative: 17 %
Lymphs Abs: 1.1 10*3/uL (ref 0.7–4.0)
MCH: 28.1 pg (ref 26.0–34.0)
MCHC: 33.7 g/dL (ref 30.0–36.0)
MCV: 83.4 fL (ref 80.0–100.0)
Monocytes Absolute: 0.5 10*3/uL (ref 0.1–1.0)
Monocytes Relative: 8 %
NEUTROS ABS: 4.5 10*3/uL (ref 1.7–7.7)
NRBC: 0 % (ref 0.0–0.2)
Neutrophils Relative %: 74 %
PLATELETS: 186 10*3/uL (ref 150–400)
RBC: 4.27 MIL/uL (ref 4.22–5.81)
RDW: 13.2 % (ref 11.5–15.5)
WBC: 6.1 10*3/uL (ref 4.0–10.5)

## 2018-03-14 MED ORDER — IOHEXOL 350 MG/ML SOLN
75.0000 mL | Freq: Once | INTRAVENOUS | Status: AC | PRN
  Administered 2018-03-14: 75 mL via INTRAVENOUS

## 2018-03-14 MED ORDER — OXYCODONE HCL 5 MG PO TABS: 5.0000 mg | ORAL_TABLET | Freq: Four times a day (QID) | ORAL | 0 refills | Status: DC | PRN

## 2018-03-14 NOTE — Telephone Encounter (Signed)
Patient came into office and reports that he is having left arm pain and chest pain. He states he has not slept well all weekend.  Patient added to be seen per Dr Tasia Catchings in [facility]

## 2018-03-14 NOTE — Progress Notes (Signed)
Nutrition  RD was not able to see patient today for scheduled nutrition appointment at 1:45pm.  Patient came into [facility] this am for chest pain and RD was not able to see patient.  Called and left message for patient that RD could see patient on Thursday, October 17th at [place]. Zenia Resides, [place], [place] Registered Dietitian [phone] (pager)

## 2018-03-14 NOTE — Progress Notes (Signed)
Symptom Management Consult note [facility]  [phone] [phone]  Patient Care Team: [place] Pink, MD as PCP - General (Family Medicine) Earlie Server, MD as Medical Oncologist (Medical Oncology)   Name of the patient: Matthew Hunter  [id number]  07-09-1929   Date of visit: 03/14/2018  Diagnosis: Prostate Cancer   Chief Complaint: Chest pain  Current Treatment: Lupron q 6 months. S/p palliative radiation to lumbar spine and SI joints. Xofigo tx 02/23/18 d/c d/t chest pain. Started back on Xtandi dose reduced daily on 02/14/18. SE itching. Started on Atarax. Xgeva for bone.   Oncology History: Patient last seen by oncologist Dr. Tasia Catchings on 02/28/2018.  At that visit he continued to feel tired and fatigued, complained of weight loss and appetite despite appetite stimulants.  Also complained of dark tarry stools which was previously worked-up and negative (hemacult X 3 negative). He was started back on Xtandi at a reduced dose of 120 mg daily.  Developed body pruritus after taking Xtandi so he stopped the medication. Started on Atarax PRN and encouraged to continue Xtandi.  Received Xgeva injection.  Started on Megace for weight loss.  They discussed his recent evaluation  in the emergency room (02/26/18) approximately 3 days after his Xofigo infusion due to intermittent left chest pain radiating to left arm.  Found to have a negative cardiac work-up with negative troponins.  CTA negative for pulmonary embolism.  CT scan did show new pulmonary nodule 1.3 cm.    Bone scan from July 2019 revealed widely progressive metastatic bony disease.  Unable to tolerate Xtandi due to CNS side effects and progressed on Abiaterone.  Not a Docetaxel candidate.  Started on Apalutamide.  Started on Xgeva monthly for bony lesions.  Developed AKI on 12/30/2017.  Had ultrasound of kidney which was unremarkable.  Seen in [facility] for dizziness and diarrhea on 01/10/2018.  He was negative  for C. difficile.  Was recommended to have CT scans and trend PSA.  Apalutamide was stopped given worsening fatigue and dizziness and rising PSA.   CT scans from August 2019 to rule out visceral metastasis and potentially begin Xofigo injections.  First Xofigo injection 02/23/18 with Dr. Donella Stade.  This is scheduled monthly.  Oncology History   Matthew Hunter 84 y.o.  male with PMH listed as below who was referred by [facility] Urology Provider [name] to me for evaluation of clinical prostate cancer and management.  He has a history of elevated PSA and BPH. PSA trends as follows: 11/14/2014 4.3, 11/07/2015 7.1, 05/11/2016 10.4, 06/11/2016 10.6, 07/07/2016 7.8, 01/06/2017 86.8.  He was seen by urology and was diagnosed with clinical prostate cancer given the extreme elevation of PSA and prostate nodule. Biopsy was not pursued. He was treated with [name] on 01/29/2017,  # prostate biopsy on 03/10/2017. 5 out of 6 cores positive for prostate cancer, with highest gleason score group 9 (4+5), + perineural invasion.  Stage IV prostate cancer, multiple osseus metastatic disease. No visceral involvement.   01/18/2018  CT scan image was independently reviewed by me and discussed with patient.  Patient has progression of bone metastatic disease only  Current treatment  Patient got first [place] on 01/29/2018, switched to leupron on 04/13/2015,  Q6 months.  .Testosterone level at 14, castration level.  Upfront Gillermina Phy was added on December 2019 to assist fast disease control (phase II trial [place] B, [facility] Oncol. 2014) S/p palliative RT to lumber spine and SI joints. Xtandi discontinued on due to CNS  toxicity in April 2019.  12/09/17 Abiaterone discontinued.  7/ 18/2019 Start Apalutimide 240mg  daily.   INTERVAL HISTORY Patient presents to follow up for management of metastatic prostate cancer.  PSA 86.8 -->64.78 -->131--> 242 added Xtanti --> 17.74-->1.82 Xtandi stopped--> 1.79- (added  Abiaterone)-> 11.06-->24.53--> 39.48 [Abiaterone stopped, Added Apalutimid]--> 65.71--> Xtandi discontinued on due to CNS toxicity in April 2019.  12/09/17 Abiaterone discontinued due to rising PSA and disease progression. 12/16/2017 Start Apalutimide 240mg  daily. Held on 01/13/2018 due to rising PSA, also causing patient to feel dizziness/black out.      Prostate cancer ([place])   05/03/2017 Initial Diagnosis    Prostate cancer ([place])    Subjective Data:  Subjective:    Matthew Hunter is a 84 y.o. male who presents for evaluation of chest pain. Onset was 1 month ago. Symptoms have worsened since that time. The patient describes the pain as pressure, sharp and radiates to the left arm. Patient rates pain as a 8/10 in intensity. Associated symptoms are: chest pain, chest pressure/discomfort, dyspnea, fatigue and palpitations. Aggravating factors are: deep inspiration and walking. Alleviating factors are: none. Patient's cardiac risk factors are: advanced age (older than 24 for men, 47 for women), diabetes mellitus, dyslipidemia and hypertension. Patient's risk factors for DVT/PE: history of malignancy and on Megace. Previous cardiac testing: chest x-ray and electrocardiogram (ECG).  The following portions of the patient's history were reviewed and updated as appropriate: allergies, current medications, past family history, past medical history, past social history, past surgical history and problem list.  Review of Systems A comprehensive review of systems was negative except for: Constitutional: positive for fatigue Respiratory: positive for pleurisy/chest pain Cardiovascular: positive for chest pain, dyspnea, exertional chest pressure/discomfort, fatigue and irregular heart beat Musculoskeletal: positive for muscle weakness    Objective:    BP (!) 149/82 (BP Location: Right Arm, Patient Position: Sitting)   Pulse 95   Temp 98.2 F (36.8 C) (Oral)   Resp 18   SpO2 98%  General appearance:  alert and fatigued Lungs: clear to auscultation bilaterally Chest wall: no tenderness, left sided costochondral tenderness Heart: regular rate and rhythm, S1, S2 normal, no murmur, click, rub or gallop Abdomen: soft, non-tender; bowel sounds normal; no masses,  no organomegaly  Cardiographics ECG: normal sinus rhythm, no blocks or conduction defects, no ischemic changes  Imaging Chest x-ray: completed recently was normal    Assessment:    Chest pain, suspected etiology: acute coronary syndrome, chest wall pain, GERD and pulmonary embolism    Could consider impingement of nerve along spine given many bony metastasis.  Most recent imaging of bone scan from July 2019 revealed widespread osseous metastatic disease.  Involved areas included calvarium, ribs, sternum, cervical, thoracic and lumbar spine, pelvis, right scapula, proximal left humerus, left femoral diaphysis and proximal left femur.   Plan:    Worsening signs and symptoms discussed and patient verbalized understanding.    Metastatic prostate cancer: Previously treated with [name] and switch to Lupron.  Xtandi December 2019. S/p palliative radiation to lumbar spine and SI joints.  Xtandi discontinued due to CNS toxicities (Confusion) in April 2019.  Started apalutamide July 2019.  Currently status post 1 cycle Xofigo on 02/23/2018.  Restarted Xtandi in September 2019 at reduced dose and thought to be tolerating well.  Chest pain: Seen recent for similar complaints in the emergency room.  Cardiac work-up negative.  Negative troponins, CT angios negative for PE.  Unclear etiology.  Given pain today is worsening we will complete  thorough cardiac work-up including:  Stat EKG. no acute changes. Stat troponins.  Negative. Stat CTA.  Negative.  He was also recently started on Megace for appetite stimulant which increasing his risk for developing blood clots. Not on blood thinners. Stat labs (CBC, CMET). Electrolytes stable.  Unclear  etiology.  Discussed case with Dr. Tasia Catchings oncologist and she recommends MRI of thoracic and cervical spine given history of metastatic disease.  Orders placed.  Patient updated.  Scheduled for MRI of spine on 04/01/2018.   In interim, patient instructed to use pain medication as prescribed and heat to affected area.  Patient to call with worsening symptoms including shortness of breath or pain that is radiating.  At this time, he is to continue his Xtandi at dose reduction as this is likely unrelated given symptoms were present prior to reinitiation of Xtandi.   Reviewed in detail his medication list.  Patient unfortunately a poor historian with current medications.  Instructed him to take his oxycodone 5 mg by mouth every 6 hours for pain.  He was scheduled to return to clinic on 03/17/2018 with weight and lab check with Thomasene Mohair injection with Dr. Donella Stade on 03/23/2018 and assessment, lab, Xgeva on 03/28/2018.  Greater than 50% was spent in counseling and coordination of care with this patient including but not limited to discussion of the relevant topics above (See A&P) including, but not limited to diagnosis and management of acute and chronic medical conditions.   Faythe Casa, NP 03/15/2018 2:49 PM

## 2018-03-15 ENCOUNTER — Other Ambulatory Visit: Payer: Self-pay | Admitting: Oncology

## 2018-03-15 DIAGNOSIS — C61 Malignant neoplasm of prostate: Secondary | ICD-10-CM

## 2018-03-15 DIAGNOSIS — M79603 Pain in arm, unspecified: Secondary | ICD-10-CM

## 2018-03-16 ENCOUNTER — Other Ambulatory Visit: Payer: Self-pay

## 2018-03-16 ENCOUNTER — Encounter: Payer: Self-pay | Admitting: Oncology

## 2018-03-16 ENCOUNTER — Inpatient Hospital Stay (HOSPITAL_BASED_OUTPATIENT_CLINIC_OR_DEPARTMENT_OTHER): Payer: Medicare Other | Admitting: Oncology

## 2018-03-16 VITALS — BP 100/63 | HR 75 | Temp 96.7°F | Resp 18 | Wt 178.6 lb

## 2018-03-16 DIAGNOSIS — C61 Malignant neoplasm of prostate: Secondary | ICD-10-CM | POA: Diagnosis not present

## 2018-03-16 DIAGNOSIS — K219 Gastro-esophageal reflux disease without esophagitis: Secondary | ICD-10-CM

## 2018-03-16 DIAGNOSIS — G893 Neoplasm related pain (acute) (chronic): Secondary | ICD-10-CM

## 2018-03-16 DIAGNOSIS — R091 Pleurisy: Secondary | ICD-10-CM

## 2018-03-16 DIAGNOSIS — Z79818 Long term (current) use of other agents affecting estrogen receptors and estrogen levels: Secondary | ICD-10-CM | POA: Diagnosis not present

## 2018-03-16 DIAGNOSIS — R5383 Other fatigue: Secondary | ICD-10-CM

## 2018-03-16 DIAGNOSIS — R5381 Other malaise: Secondary | ICD-10-CM

## 2018-03-16 DIAGNOSIS — Z923 Personal history of irradiation: Secondary | ICD-10-CM

## 2018-03-16 DIAGNOSIS — Z7982 Long term (current) use of aspirin: Secondary | ICD-10-CM

## 2018-03-16 DIAGNOSIS — R530 Neoplastic (malignant) related fatigue: Secondary | ICD-10-CM

## 2018-03-16 DIAGNOSIS — M79603 Pain in arm, unspecified: Secondary | ICD-10-CM

## 2018-03-16 DIAGNOSIS — R634 Abnormal weight loss: Secondary | ICD-10-CM

## 2018-03-16 DIAGNOSIS — Z79899 Other long term (current) drug therapy: Secondary | ICD-10-CM

## 2018-03-16 DIAGNOSIS — C7951 Secondary malignant neoplasm of bone: Secondary | ICD-10-CM | POA: Diagnosis not present

## 2018-03-16 DIAGNOSIS — I2510 Atherosclerotic heart disease of native coronary artery without angina pectoris: Secondary | ICD-10-CM

## 2018-03-16 DIAGNOSIS — Z87891 Personal history of nicotine dependence: Secondary | ICD-10-CM

## 2018-03-16 DIAGNOSIS — R351 Nocturia: Secondary | ICD-10-CM

## 2018-03-16 DIAGNOSIS — I10 Essential (primary) hypertension: Secondary | ICD-10-CM

## 2018-03-16 DIAGNOSIS — K921 Melena: Secondary | ICD-10-CM

## 2018-03-16 DIAGNOSIS — D649 Anemia, unspecified: Secondary | ICD-10-CM

## 2018-03-16 DIAGNOSIS — M1990 Unspecified osteoarthritis, unspecified site: Secondary | ICD-10-CM

## 2018-03-16 DIAGNOSIS — M549 Dorsalgia, unspecified: Secondary | ICD-10-CM

## 2018-03-16 MED ORDER — FENTANYL 25 MCG/HR TD PT72: 25.0000 ug | MEDICATED_PATCH | TRANSDERMAL | 0 refills | Status: DC

## 2018-03-16 MED ORDER — MEGESTROL ACETATE 40 MG PO TABS: 80.0000 mg | ORAL_TABLET | Freq: Every day | ORAL | 0 refills | Status: DC

## 2018-03-16 NOTE — Progress Notes (Signed)
Patient here for follow up. He states he has no appetite. He continue to have pain to left arm and left flank. He gets tired easily and stays tired a lot.

## 2018-03-17 ENCOUNTER — Telehealth: Payer: Self-pay | Admitting: *Deleted

## 2018-03-17 ENCOUNTER — Inpatient Hospital Stay: Payer: Medicare Other

## 2018-03-17 DIAGNOSIS — C61 Malignant neoplasm of prostate: Secondary | ICD-10-CM | POA: Diagnosis not present

## 2018-03-17 DIAGNOSIS — C7951 Secondary malignant neoplasm of bone: Secondary | ICD-10-CM

## 2018-03-17 LAB — CBC WITH DIFFERENTIAL/PLATELET
Abs Immature Granulocytes: 0.02 10*3/uL (ref 0.00–0.07)
Basophils Absolute: 0 10*3/uL (ref 0.0–0.1)
Basophils Relative: 0 %
EOS ABS: 0.1 10*3/uL (ref 0.0–0.5)
EOS PCT: 1 %
HEMATOCRIT: 34 % — AB (ref 39.0–52.0)
Hemoglobin: 11.5 g/dL — ABNORMAL LOW (ref 13.0–17.0)
Immature Granulocytes: 0 %
LYMPHS ABS: 1.2 10*3/uL (ref 0.7–4.0)
Lymphocytes Relative: 18 %
MCH: 28 pg (ref 26.0–34.0)
MCHC: 33.8 g/dL (ref 30.0–36.0)
MCV: 82.9 fL (ref 80.0–100.0)
MONOS PCT: 13 %
Monocytes Absolute: 0.9 10*3/uL (ref 0.1–1.0)
NRBC: 0 % (ref 0.0–0.2)
Neutro Abs: 4.5 10*3/uL (ref 1.7–7.7)
Neutrophils Relative %: 68 %
Platelets: 192 10*3/uL (ref 150–400)
RBC: 4.1 MIL/uL — ABNORMAL LOW (ref 4.22–5.81)
RDW: 13 % (ref 11.5–15.5)
WBC: 6.6 10*3/uL (ref 4.0–10.5)

## 2018-03-17 NOTE — Progress Notes (Signed)
Nutrition Assessment:  Referral for weight loss  84 year old male with stage IV prostate cancer with bone mets. Past medical history of CAD, constipation, HTN GERD. Patient describes what sounds like nissen fundaplication done about 5 years ago.  Patient in [facility] clinic on Monday for chest pain.    Met with patient and wife prior to lab work today.   Patient reports no appetite but also feeling as if food (solid) are getting stuck in mid esophagus (points to middle of chest area.  This effects his ability to keep eating.  Describes eating few bites of chicken breast last night and felt like getting stuck was able to go on down.  No choking episodes.  Reports some issues with constipation mostly some diarrhea.  Drinks lactaid milk.  Reports drinks ensure about 2 or more per day.  Eats cereal for breakfast or oatmeal or tomato sandwich.  Sometimes skips lunch or has pack nabs, soup.  Supper is usually more soup, last night few bites chicken breast and potato salad.  Likes to snack on canned fruit.      Medications: prilosec  Labs: reviewed  Anthropometrics:   Height: 69 inches Weight: 178 lb 1 oz taken today in clinic UBW: about 15lb heavier per patient.  Noted 190 lb on 12/16/17 BMI: 26  6% weight loss in the last 3 months, not significant   Estimated Energy Needs  Kcals: 2025-2430 kcals Protein: 101-121 g/d Fluid: 2.4 L/d  NUTRITION DIAGNOSIS: Unintentional weight loss related to poor appetite and feeling of food getting stuck in mid esophagus area as evidenced by 6% weight loss in 3 months and decreased intake   MALNUTRITION DIAGNOSIS: continue to monitor   INTERVENTION:  Reviewed strategies to increase calories and protein. Fact sheet given Discussed adding moisture to foods to help easy with food getting stuck.  Will send message to Dr. Tasia Catchings as well regarding patient symptoms. Noted recent work up for chest pain Discussed oral nutrition supplement and samples given today with  coupons     MONITORING, EVALUATION, GOAL: weight trends, intake   NEXT VISIT: Monday, October 28   Shawntavia Saunders B. Zenia Resides, [place], [place] Registered Dietitian [phone] (pager)

## 2018-03-17 NOTE — Telephone Encounter (Signed)
Called patient and made him aware of the appt time changes on 03/22/18 He his aware of the lab addon @ 8:45 and MD visit @ 9:15

## 2018-03-17 NOTE — Progress Notes (Signed)
Hematology/Oncology  Follow up note [facility] [phone] [phone]  Patient Care Team: [place] Pink, MD as PCP - General (Family Medicine) Earlie Server, MD as Medical Oncologist (Medical Oncology)  REASON FOR VISIT Follow up for antineoplasm treatment of Prostate Cancer  HISTORY OF PRESENTING ILLNESS:  Matthew Hunter 84 y.o.  male with PMH listed as below who was referred by [facility] Urology Provider [name] to me for evaluation of clinical prostate cancer and management.  He has a history of elevated PSA and BPH. PSA trends as follows: 11/14/2014 4.3, 11/07/2015 7.1, 05/11/2016 10.4, 06/11/2016 10.6, 07/07/2016 7.8, 01/06/2017 86.8.  He was seen by urology and was diagnosed with clinical prostate cancer given the extreme elevation of PSA and prostate nodule. Biopsy was not pursued. He was treated with [name] on 01/29/2017,  # prostate biopsy on 03/10/2017. 5 out of 6 cores positive for prostate cancer, with highest gleason score group 9 (4+5), + perineural invasion.  Stage IV prostate cancer, multiple osseus metastatic disease. No visceral involvement.   01/18/2018  CT scan image was independently reviewed by me and discussed with patient.  Patient has progression of bone metastatic disease only  Current treatment  Patient got first Shannon on 01/29/2018, switched to leupron on 04/13/2015,  Q6 months.  .Testosterone level at 14, castration level.  Upfront Gillermina Phy was added on December 2019 to assist fast disease control (phase II trial [place] B, [facility] Oncol. 2014) S/p palliative RT to lumber spine and SI joints. Xtandi discontinued on due to CNS toxicity in April 2019.  12/09/17 Abiaterone discontinued.  7/ 18/2019 Start Apalutimide 240mg  daily.   INTERVAL HISTORY Patient presents to follow up for management of metastatic prostate cancer.  PSA 86.8 -->64.78 -->131--> 242 added Xtanti --> 17.74-->1.82 Xtandi stopped--> 1.79- (added  Abiaterone)-> 11.06-->24.53--> 39.48 [Abiaterone stopped, Added Apalutimid]--> 65.71--> Xtandi discontinued on due to CNS toxicity in April 2019.  12/09/17 Abiaterone discontinued due to rising PSA and disease progression. 12/16/2017 Start Apalutimide 240mg  daily. Held on 01/13/2018 due to rising PSA, also causing patient to feel dizziness/black out.  Started Xofigo treatment on 02/23/2018. 02/28/2018, back on Xtandi reduced dose 120mg .   INTERVAL HISTORY Matthew Hunter is a 84 y.o. male who has above history reviewed by me today presents for follow up visit for management of metastatic prostate cancer. Problems and complaints are listed below: Earlier this week, patient presented to cancer center and complained weight loss, fatigue, intermittent left chest pain radiated to left arm. Patient was evaluated by nurse practitioner. He also had emergency room visit on 02/26/2018 due to the same complaints and had a negative cardiac work-up including negative troponins, no remarkable EKG changes, CT chest angiogram negative for PE, new pulmonary nodule 1.3 cm. Patient had further work-up at cancer center on 03/14/2018 evaluated by nurse practitioner.  Had again negative cardiology work-up.  Another chest CT angiogram PE protocol was obtained negative for PE.  Patient reports that he continues to have intermittent left chest pain radiating to left arm, as well as back pain. He is currently on Xofigo treatment, started in September 2019. He takes Norco every 4 hours scheduled.  He used to use fentanyl patch and he ran out of that prescription never called requesting a refill.  He thought since his pain was gone at that time he no longer need the fentanyl patch.  Continue to feel tired and fatigued.  Ongoing weight loss with very poor appetite despite taking Megace.  Previously tried Marinol 5 mg  twice daily not helpful to. Currently he has not been on Megace for a while. Patient takes Xtandi 120 mg daily and  reports tolerating well.  No additional itchy episodes.  Never need to take Atarax. #Also feeling that food is sticking in his food pipe.Marland Kitchen  History of GERD taking omeprazole.   ROS:  Review of Systems  Constitutional: Positive for fatigue and unexpected weight change. Negative for appetite change, chills, diaphoresis and fever.  HENT:   Negative for hearing loss, lump/mass, mouth sores, nosebleeds and sore throat.   Eyes: Negative for eye problems and icterus.  Respiratory: Negative for chest tightness, cough, hemoptysis, shortness of breath and wheezing.   Cardiovascular: Negative for chest pain and leg swelling.  Gastrointestinal: Negative for abdominal distention, abdominal pain, blood in stool, diarrhea, nausea and rectal pain.  Endocrine: Negative for hot flashes.  Genitourinary: Positive for nocturia. Negative for bladder incontinence, difficulty urinating, dysuria, frequency and hematuria.   Musculoskeletal: Positive for back pain. Negative for arthralgias, flank pain, gait problem and myalgias.  Skin: Negative for itching and rash.  Neurological: Negative for dizziness, gait problem, headaches, light-headedness, numbness and seizures.  Hematological: Negative for adenopathy. Does not bruise/bleed easily.  Psychiatric/Behavioral: Negative for confusion, decreased concentration, depression and sleep disturbance. The patient is not nervous/anxious.     MEDICAL HISTORY:  Past Medical History:  Diagnosis Date  . Arthritis   . Benign enlargement of prostate   . CAD (coronary artery disease)   . Constipation   . Elevated PSA   . GERD (gastroesophageal reflux disease)   . Hypertension   . Incomplete bladder emptying   . Nocturia   . Prostate cancer ([place]) 05/03/2017   Rad tx's and Lupron shots.   . Urgency of micturation   . Urinary frequency     SURGICAL HISTORY: Past Surgical History:  Procedure Laterality Date  . GALLBLADDER SURGERY    . PROSTATE SURGERY    . STOMACH  SURGERY      SOCIAL HISTORY: Social History   Socioeconomic History  . Marital status: Married    Spouse name: Not on file  . Number of children: Not on file  . Years of education: Not on file  . Highest education level: Not on file  Occupational History  . Not on file  Social Needs  . Financial resource strain: Not on file  . Food insecurity:    Worry: Not on file    Inability: Not on file  . Transportation needs:    Medical: Not on file    Non-medical: Not on file  Tobacco Use  . Smoking status: Former Smoker    Last attempt to quit: 06/01/1998    Years since quitting: 19.8  . Smokeless tobacco: Never Used  Substance and Sexual Activity  . Alcohol use: No    Alcohol/week: 0.0 standard drinks  . Drug use: Not Currently    Frequency: 2.0 times per week    Types: Marijuana    Comment: marijuana two time monthly gives him an appetite  . Sexual activity: Not on file  Lifestyle  . Physical activity:    Days per week: Not on file    Minutes per session: Not on file  . Stress: Not on file  Relationships  . Social connections:    Talks on phone: Not on file    Gets together: Not on file    Attends religious service: Not on file    Active member of club or organization: Not on file  Attends meetings of clubs or organizations: Not on file    Relationship status: Not on file  . Intimate partner violence:    Fear of current or ex partner: Not on file    Emotionally abused: Not on file    Physically abused: Not on file    Forced sexual activity: Not on file  Other Topics Concern  . Not on file  Social History Narrative  . Not on file    FAMILY HISTORY: Family History  Problem Relation Age of Onset  . Hypertension Mother   . Stroke Father   . Kidney disease Neg Hx   . Prostate cancer Neg Hx   . Kidney cancer Neg Hx   . Bladder Cancer Neg Hx     ALLERGIES:  has No Known Allergies.  MEDICATIONS:  Current Outpatient Medications  Medication Sig Dispense Refill   . aspirin EC 81 MG tablet Take by mouth.    Marland Kitchen atenolol (TENORMIN) 100 MG tablet Take 100 mg by mouth daily.     . Calcium Carb-Cholecalciferol (CALCIUM-VITAMIN D) 500-200 MG-UNIT tablet Take 1 tablet by mouth daily. 90 tablet 0  . dutasteride (AVODART) 0.5 MG capsule Take 1 capsule (0.5 mg total) by mouth daily. 30 capsule 0  . enzalutamide (XTANDI) 40 MG capsule Take 3 capsules (120 mg total) by mouth daily. 90 capsule 0  . fentaNYL (DURAGESIC - DOSED MCG/HR) 25 MCG/HR patch Place 1 patch (25 mcg total) onto the skin every 3 (three) days. 10 patch 0  . finasteride (PROSCAR) 5 MG tablet Take 1 tablet (5 mg total) by mouth daily. 90 tablet 3  . HYDROcodone-acetaminophen (NORCO) 5-325 MG tablet Take 1 tablet by mouth every 4 (four) hours as needed for moderate pain. 6 tablet 0  . hydrocortisone 2.5 % cream APP EXT AA BID  0  . mirabegron ER (MYRBETRIQ) 25 MG TB24 tablet Take 1 tablet (25 mg total) by mouth daily. 90 tablet 3  . nortriptyline (PAMELOR) 25 MG capsule Take 25 mg by mouth at bedtime. Reported on 05/16/2015  1  . omeprazole (PRILOSEC) 20 MG capsule Take 20 mg by mouth daily.    Marland Kitchen oxyCODONE (ROXICODONE) 5 MG immediate release tablet Take 1 tablet (5 mg total) by mouth every 6 (six) hours as needed for severe pain. 30 tablet 0  . polyethylene glycol (MIRALAX / GLYCOLAX) packet Take 17 g by mouth daily. Mix one tablespoon with 8oz of your favorite juice or water every day until you are having soft formed stools. Then start taking once daily if you didn't have a stool the day before. 30 each 0  . pyridOXINE (VITAMIN B-6) 50 MG tablet Take 50 mg by mouth daily.     . tamsulosin (FLOMAX) 0.4 MG CAPS capsule Take 1 capsule (0.4 mg total) by mouth daily. 90 capsule 3  . Telmisartan-Amlodipine 80-5 MG TABS Take 1 tablet by mouth daily.     . megestrol (MEGACE) 40 MG tablet Take 2 tablets (80 mg total) by mouth daily. 30 tablet 0   No current facility-administered medications for this visit.        Marland Kitchen  PHYSICAL EXAMINATION: ECOG PERFORMANCE STATUS: 1 - Symptomatic but completely ambulatory Vitals:   03/16/18 1338  BP: 100/63  Pulse: 75  Resp: 18  Temp: (!) 96.7 F (35.9 C)   Filed Weights   03/16/18 1338  Weight: 178 lb 9.6 oz (81 kg)   Physical Exam  Constitutional: He is oriented to person, place, and time.  No distress.  HENT:  Head: Normocephalic and atraumatic.  Mouth/Throat: Oropharynx is clear and moist. No oropharyngeal exudate.  Eyes: Pupils are equal, round, and reactive to light. Conjunctivae and EOM are normal. No scleral icterus.  Neck: Normal range of motion. Neck supple. No JVD present. No thyromegaly present.  Cardiovascular: Normal rate, regular rhythm and normal heart sounds.  No murmur heard. Pulmonary/Chest: Effort normal. No stridor. No respiratory distress. He has no wheezes. He has no rales. He exhibits no tenderness.  Abdominal: Soft. Bowel sounds are normal. He exhibits no distension and no mass. There is no tenderness. There is no guarding.  Musculoskeletal: Normal range of motion. He exhibits no edema or deformity.  Lymphadenopathy:    He has no cervical adenopathy.  Neurological: He is alert and oriented to person, place, and time. He displays normal reflexes. No cranial nerve deficit. Coordination normal.  Skin: Skin is warm and dry. No rash noted. He is not diaphoretic. No erythema.  Psychiatric: He has a normal mood and affect. His behavior is normal. Thought content normal.   LABORATORY DATA:  I have reviewed the data as listed Lab Results  Component Value Date   WBC 6.1 03/14/2018   HGB 12.0 (L) 03/14/2018   HCT 35.6 (L) 03/14/2018   MCV 83.4 03/14/2018   PLT 186 03/14/2018   Recent Labs    01/13/18 1315 02/26/18 1309 02/28/18 1342 03/14/18 1017  NA 140 137 135 136  K 4.1 4.3 3.9 4.2  CL 106 106 106 106  CO2 25 23 22 22   GLUCOSE 119* 100* 109* 171*  BUN 12 14 14 11   CREATININE 1.21 1.10 1.16 1.11  CALCIUM 10.1 9.3  9.4 9.9  GFRNONAA 53* 59* 55* 58*  GFRAA >60 >60 >60 >60  PROT 8.1  --  7.9 8.2*  ALBUMIN 4.4  --  3.9 4.2  AST 29  --  27 27  ALT 16  --  16 15  ALKPHOS 52  --  40 44  BILITOT 0.9  --  1.5* 0.8   RADIOGRAPHIC STUDIES: I have personally reviewed the radiological images as listed and agreed with the findings in the report. 12/15/2017 Bone scan whole body showed new sites of osseous tracer accumulation are identified especially to size at the distal left femoral diaphysis, L2 vertebral body, right iliac bone, right ischium. 01/18/2018 chest abdomen pelvis with contrast Extensive metastasis disease to bone, with all bone lesions new from the abdomen and pelvis CT dated 9/5 2018 and a chest CT dated 06/19/2013.  Involvement is similar to the recent bone scan.  No other evidence of metastatic disease.  No acute abnormalities in the chest abdomen and pelvis.  ASSESSMENT & PLAN: 84 yo male follows up for management of metastatic prostate cancer .  1. Prostate cancer ([place])   2. Pain of upper extremity, unspecified laterality   3. Weight loss   4. Bone metastasis ([place])   5. Neoplasm related pain   6. Black stools   7. Malaise and fatigue   8. Neoplastic malignant related fatigue   9. Anemia, unspecified type    #Castration resistant prostate cancer, with rising PSA Patient is currently on Xofigo treatment.  He was also restarted on reduced dose of Xtandi a few weeks ago.  Tolerates well.  For now I will keep him on Xtandi 120mg  daily for now Patient previously not interested in chemotherapy.  Today we discussed again if Gillermina Phy is not controlling his disease, he is open to  chemotherapy options. We will also send NGS profile for his prostate cancer. He is due for Testosterone injection Q24 weeks. Due 03/27/2018   #Intermittent chest pain radiating to left arm, etiology unclear.  He has had negative EKG, troponin.  Questionable radiculopathy versus neoplastic pain with suboptimal pain regimen.   He has not been using fentanyl patch since he ran out of the prescription. Bone scan was independently reviewed by me and he has wide metastatic bone involvement,including bilateral anterior and posterior ribs and proximal left humerus,  which maybe why he has pain.  I sent him another prescription of fentanyl patch and advised patient to apply.  We will reevaluate him next week to further titrate pain control.  #Weight loss/poor appetite/fatigue, I resent a prescription of Megace 80 mg daily.  Can further increase dose. TSH was checked in August, normal. Check cortisol level  # Anemia, check iron panel.  #Black stool, hemoglobin has been stable..  Previously stool occult blood negative x 3.  He is not interested in colonoscopy. Continue monitor.  Orders Placed This Encounter  Procedures  . Iron and TIBC    Standing Status:   Future    Standing Expiration Date:   03/18/2019  . Ferritin    Standing Status:   Future    Standing Expiration Date:   03/18/2019  . CBC with Differential/Platelet    Standing Status:   Future    Standing Expiration Date:   03/18/2019  . Cortisol    Standing Status:   Future    Standing Expiration Date:   03/18/2019  . Testosterone    Standing Status:   Future    Standing Expiration Date:   03/18/2019  We spent sufficient time to discuss many aspect of care, questions were answered to patient's satisfaction. The patient knows to call the clinic with any problems questions or concerns.  Return of visit: 1 week.   Earlie Server, MD, PhD Hematology Oncology [facility] Pager- [phone] 03/17/2018

## 2018-03-22 ENCOUNTER — Ambulatory Visit: Payer: Medicare Other | Admitting: Oncology

## 2018-03-22 ENCOUNTER — Other Ambulatory Visit: Payer: Self-pay

## 2018-03-22 ENCOUNTER — Encounter: Payer: Self-pay | Admitting: Oncology

## 2018-03-22 ENCOUNTER — Inpatient Hospital Stay: Payer: Medicare Other

## 2018-03-22 ENCOUNTER — Inpatient Hospital Stay (HOSPITAL_BASED_OUTPATIENT_CLINIC_OR_DEPARTMENT_OTHER): Payer: Medicare Other | Admitting: Oncology

## 2018-03-22 VITALS — BP 121/70 | HR 96 | Temp 96.0°F | Resp 18 | Wt 177.3 lb

## 2018-03-22 DIAGNOSIS — C7951 Secondary malignant neoplasm of bone: Secondary | ICD-10-CM

## 2018-03-22 DIAGNOSIS — Z7982 Long term (current) use of aspirin: Secondary | ICD-10-CM

## 2018-03-22 DIAGNOSIS — R634 Abnormal weight loss: Secondary | ICD-10-CM

## 2018-03-22 DIAGNOSIS — I2510 Atherosclerotic heart disease of native coronary artery without angina pectoris: Secondary | ICD-10-CM

## 2018-03-22 DIAGNOSIS — C61 Malignant neoplasm of prostate: Secondary | ICD-10-CM

## 2018-03-22 DIAGNOSIS — M79603 Pain in arm, unspecified: Secondary | ICD-10-CM

## 2018-03-22 DIAGNOSIS — R530 Neoplastic (malignant) related fatigue: Secondary | ICD-10-CM

## 2018-03-22 DIAGNOSIS — Z923 Personal history of irradiation: Secondary | ICD-10-CM | POA: Diagnosis not present

## 2018-03-22 DIAGNOSIS — R0789 Other chest pain: Secondary | ICD-10-CM

## 2018-03-22 DIAGNOSIS — Z79818 Long term (current) use of other agents affecting estrogen receptors and estrogen levels: Secondary | ICD-10-CM | POA: Diagnosis not present

## 2018-03-22 DIAGNOSIS — R5381 Other malaise: Secondary | ICD-10-CM

## 2018-03-22 DIAGNOSIS — G893 Neoplasm related pain (acute) (chronic): Secondary | ICD-10-CM

## 2018-03-22 DIAGNOSIS — M79602 Pain in left arm: Secondary | ICD-10-CM

## 2018-03-22 DIAGNOSIS — I10 Essential (primary) hypertension: Secondary | ICD-10-CM

## 2018-03-22 DIAGNOSIS — Z79899 Other long term (current) drug therapy: Secondary | ICD-10-CM

## 2018-03-22 DIAGNOSIS — R5383 Other fatigue: Secondary | ICD-10-CM

## 2018-03-22 DIAGNOSIS — D649 Anemia, unspecified: Secondary | ICD-10-CM

## 2018-03-22 DIAGNOSIS — K219 Gastro-esophageal reflux disease without esophagitis: Secondary | ICD-10-CM

## 2018-03-22 DIAGNOSIS — M1990 Unspecified osteoarthritis, unspecified site: Secondary | ICD-10-CM

## 2018-03-22 DIAGNOSIS — Z87891 Personal history of nicotine dependence: Secondary | ICD-10-CM

## 2018-03-22 DIAGNOSIS — K921 Melena: Secondary | ICD-10-CM

## 2018-03-22 LAB — CORTISOL: CORTISOL PLASMA: 16.5 ug/dL

## 2018-03-22 MED ORDER — FENTANYL 12 MCG/HR TD PT72: 12.5000 ug | MEDICATED_PATCH | TRANSDERMAL | 0 refills | Status: DC

## 2018-03-22 MED ORDER — DOCUSATE SODIUM 100 MG PO CAPS: 100.0000 mg | ORAL_CAPSULE | Freq: Every day | ORAL | 1 refills | Status: DC

## 2018-03-22 MED ORDER — MEGESTROL ACETATE 40 MG PO TABS: 80.0000 mg | ORAL_TABLET | Freq: Two times a day (BID) | ORAL | 0 refills | Status: DC

## 2018-03-22 NOTE — Progress Notes (Signed)
Hematology/Oncology  Follow up note [facility] Telephone:([phone]) [phone] Fax:(336) [phone]  Patient Care Team: [place] Pink, MD as PCP - General (Family Medicine) Earlie Server, MD as Medical Oncologist (Medical Oncology)  REASON FOR VISIT Follow up for antineoplasm treatment of Prostate Cancer  HISTORY OF PRESENTING ILLNESS:  Matthew Hunter 84 y.o.  male with PMH listed as below who was referred by [facility] Urology Provider [name] to me for evaluation of clinical prostate cancer and management.  He has a history of elevated PSA and BPH. PSA trends as follows: 11/14/2014 4.3, 11/07/2015 7.1, 05/11/2016 10.4, 06/11/2016 10.6, 07/07/2016 7.8, 01/06/2017 86.8.  He was seen by urology and was diagnosed with clinical prostate cancer given the extreme elevation of PSA and prostate nodule. Biopsy was not pursued. He was treated with [name] on 01/29/2017,  # prostate biopsy on 03/10/2017. 5 out of 6 cores positive for prostate cancer, with highest gleason score group 9 (4+5), + perineural invasion.  Stage IV prostate cancer, multiple osseus metastatic disease. No visceral involvement.   01/18/2018  CT scan image was independently reviewed by me and discussed with patient.  Patient has progression of bone metastatic disease only  Current treatment  Patient got first Calzada on 01/29/2018, switched to leupron on 04/13/2015,  Q6 months.  .Testosterone level at 14, castration level.  Upfront Gillermina Phy was added on December 2019 to assist fast disease control (phase II trial [place] B, [facility] Oncol. 2014) S/p palliative RT to lumber spine and SI joints. Xtandi discontinued on due to CNS toxicity in April 2019.  12/09/17 Abiaterone discontinued.  7/ 18/2019 Start Apalutimide 240mg  daily.     INTERVAL HISTORY Patient presents to follow up for management of metastatic prostate cancer and multiple neoplasm related symptoms.    PSA 86.8 -->64.78 -->131--> 242 added Xtanti -->  17.74-->1.82 Xtandi stopped due to CNS toxicity in April 2019. --> 1.79- (added Abiaterone)-> 11.06-->24.53--> 39.48 [Abiaterone stopped, Added Apalutimid]--> 65.71-->88.63--> Xofigo treatment--> 215--> Back on Xtandi reduced dose.   12/09/17 Abiaterone discontinued due to rising PSA and disease progression. 12/16/2017 Start Apalutimide 240mg  daily. Held on 01/13/2018 due to rising PSA, also causing patient to feel dizziness/black out.  Started Xofigo treatment on 02/23/2018. 02/28/2018, back on Xtandi reduced dose 120mg .   INTERVAL HISTORY Matthew Hunter is a 84 y.o. male who has above history reviewed by me today presents for follow up visit for mangement of metastatic prostate cancer.  Problems and complaints are listed below:  # Left side anterior chest wall pain and left arm pain: started on fentanyle 62mcg Q72 hours patch, and also decreased the frequency of oxycodone 5mg  Q4 hours to Q6 hours.   # Weight loss, poor appetite. He is taking Megace 80mg  daily. Reports not helping increasing appetite.  # Continues to feel tired and fatigue.  # Takes Xtandi 120mg  daily without experiencing any significant side effects.    ROS:  Review of Systems  Constitutional: Positive for fatigue and unexpected weight change. Negative for appetite change, chills, diaphoresis and fever.  HENT:   Negative for hearing loss, lump/mass, mouth sores, nosebleeds and sore throat.   Eyes: Negative for eye problems and icterus.  Respiratory: Negative for chest tightness, cough, hemoptysis, shortness of breath and wheezing.   Cardiovascular: Negative for chest pain and leg swelling.       Chest wall pain, and left arm pain.  Gastrointestinal: Negative for abdominal distention, abdominal pain, blood in stool, diarrhea, nausea and rectal pain.  Endocrine: Negative for hot flashes.  Genitourinary: Positive for nocturia. Negative for bladder incontinence, difficulty urinating, dysuria, frequency and hematuria.     Musculoskeletal: Negative for arthralgias, back pain, flank pain, gait problem and myalgias.  Skin: Negative for itching and rash.  Neurological: Negative for dizziness, gait problem, headaches, light-headedness, numbness and seizures.  Hematological: Negative for adenopathy. Does not bruise/bleed easily.  Psychiatric/Behavioral: Negative for confusion, decreased concentration, depression and sleep disturbance. The patient is not nervous/anxious.     MEDICAL HISTORY:  Past Medical History:  Diagnosis Date  . Arthritis   . Benign enlargement of prostate   . CAD (coronary artery disease)   . Constipation   . Elevated PSA   . GERD (gastroesophageal reflux disease)   . Hypertension   . Incomplete bladder emptying   . Nocturia   . Prostate cancer ([place]) 05/03/2017   Rad tx's and Lupron shots.   . Urgency of micturation   . Urinary frequency     SURGICAL HISTORY: Past Surgical History:  Procedure Laterality Date  . GALLBLADDER SURGERY    . PROSTATE SURGERY    . STOMACH SURGERY      SOCIAL HISTORY: Social History   Socioeconomic History  . Marital status: Married    Spouse name: Not on file  . Number of children: Not on file  . Years of education: Not on file  . Highest education level: Not on file  Occupational History  . Not on file  Social Needs  . Financial resource strain: Not on file  . Food insecurity:    Worry: Not on file    Inability: Not on file  . Transportation needs:    Medical: Not on file    Non-medical: Not on file  Tobacco Use  . Smoking status: Former Smoker    Last attempt to quit: 06/01/1998    Years since quitting: 19.8  . Smokeless tobacco: Never Used  Substance and Sexual Activity  . Alcohol use: No    Alcohol/week: 0.0 standard drinks  . Drug use: Not Currently    Frequency: 2.0 times per week    Types: Marijuana    Comment: marijuana two time monthly gives him an appetite  . Sexual activity: Not on file  Lifestyle  . Physical  activity:    Days per week: Not on file    Minutes per session: Not on file  . Stress: Not on file  Relationships  . Social connections:    Talks on phone: Not on file    Gets together: Not on file    Attends religious service: Not on file    Active member of club or organization: Not on file    Attends meetings of clubs or organizations: Not on file    Relationship status: Not on file  . Intimate partner violence:    Fear of current or ex partner: Not on file    Emotionally abused: Not on file    Physically abused: Not on file    Forced sexual activity: Not on file  Other Topics Concern  . Not on file  Social History Narrative  . Not on file    FAMILY HISTORY: Family History  Problem Relation Age of Onset  . Hypertension Mother   . Stroke Father   . Kidney disease Neg Hx   . Prostate cancer Neg Hx   . Kidney cancer Neg Hx   . Bladder Cancer Neg Hx     ALLERGIES:  has No Known Allergies.  MEDICATIONS:  Current Outpatient Medications  Medication Sig Dispense Refill  . aspirin EC 81 MG tablet Take by mouth.    Marland Kitchen atenolol (TENORMIN) 100 MG tablet Take 100 mg by mouth daily.     . Calcium Carb-Cholecalciferol (CALCIUM-VITAMIN D) 500-200 MG-UNIT tablet Take 1 tablet by mouth daily. 90 tablet 0  . dutasteride (AVODART) 0.5 MG capsule Take 1 capsule (0.5 mg total) by mouth daily. 30 capsule 0  . enzalutamide (XTANDI) 40 MG capsule Take 3 capsules (120 mg total) by mouth daily. 90 capsule 0  . fentaNYL (DURAGESIC - DOSED MCG/HR) 25 MCG/HR patch Place 1 patch (25 mcg total) onto the skin every 3 (three) days. 10 patch 0  . finasteride (PROSCAR) 5 MG tablet Take 1 tablet (5 mg total) by mouth daily. 90 tablet 3  . HYDROcodone-acetaminophen (NORCO) 5-325 MG tablet Take 1 tablet by mouth every 4 (four) hours as needed for moderate pain. 6 tablet 0  . hydrocortisone 2.5 % cream APP EXT AA BID  0  . megestrol (MEGACE) 40 MG tablet Take 2 tablets (80 mg total) by mouth 2 (two) times  daily. 120 tablet 0  . mirabegron ER (MYRBETRIQ) 25 MG TB24 tablet Take 1 tablet (25 mg total) by mouth daily. 90 tablet 3  . nortriptyline (PAMELOR) 25 MG capsule Take 25 mg by mouth at bedtime. Reported on 05/16/2015  1  . omeprazole (PRILOSEC) 20 MG capsule Take 20 mg by mouth daily.    Marland Kitchen oxyCODONE (ROXICODONE) 5 MG immediate release tablet Take 1 tablet (5 mg total) by mouth every 6 (six) hours as needed for severe pain. 30 tablet 0  . polyethylene glycol (MIRALAX / GLYCOLAX) packet Take 17 g by mouth daily. Mix one tablespoon with 8oz of your favorite juice or water every day until you are having soft formed stools. Then start taking once daily if you didn't have a stool the day before. 30 each 0  . pyridOXINE (VITAMIN B-6) 50 MG tablet Take 50 mg by mouth daily.     . tamsulosin (FLOMAX) 0.4 MG CAPS capsule Take 1 capsule (0.4 mg total) by mouth daily. 90 capsule 3  . Telmisartan-Amlodipine 80-5 MG TABS Take 1 tablet by mouth daily.     Marland Kitchen docusate sodium (COLACE) 100 MG capsule Take 1 capsule (100 mg total) by mouth daily. 30 capsule 1  . fentaNYL (DURAGESIC - DOSED MCG/HR) 12 MCG/HR Place 1 patch (12.5 mcg total) onto the skin every 3 (three) days. 10 patch 0   No current facility-administered medications for this visit.       Marland Kitchen  PHYSICAL EXAMINATION: ECOG PERFORMANCE STATUS: 1 - Symptomatic but completely ambulatory Vitals:   03/22/18 0855  BP: 121/70  Pulse: 96  Resp: 18  Temp: (!) 96 F (35.6 C)   Filed Weights   03/22/18 0855  Weight: 177 lb 5 oz (80.4 kg)   Physical Exam  Constitutional: He is oriented to person, place, and time. No distress.  HENT:  Head: Normocephalic and atraumatic.  Mouth/Throat: Oropharynx is clear and moist. No oropharyngeal exudate.  Eyes: Pupils are equal, round, and reactive to light. Conjunctivae and EOM are normal. No scleral icterus.  Neck: Normal range of motion. Neck supple. No JVD present. No thyromegaly present.  Cardiovascular:  Normal rate, regular rhythm and normal heart sounds.  No murmur heard. Pulmonary/Chest: Effort normal. No stridor. No respiratory distress. He has no wheezes. He has no rales. He exhibits no tenderness.  Abdominal: Soft. Bowel sounds are normal. He exhibits no distension  and no mass. There is no tenderness. There is no guarding.  Musculoskeletal: Normal range of motion. He exhibits no edema or deformity.  Lymphadenopathy:    He has no cervical adenopathy.  Neurological: He is alert and oriented to person, place, and time. He displays normal reflexes. No cranial nerve deficit. Coordination normal.  Skin: Skin is warm and dry. No rash noted. He is not diaphoretic. No erythema.  Psychiatric: He has a normal mood and affect. His behavior is normal. Thought content normal.   LABORATORY DATA:  I have reviewed the data as listed Lab Results  Component Value Date   WBC 6.6 03/17/2018   HGB 11.5 (L) 03/17/2018   HCT 34.0 (L) 03/17/2018   MCV 82.9 03/17/2018   PLT 192 03/17/2018   Recent Labs    01/13/18 1315 02/26/18 1309 02/28/18 1342 03/14/18 1017  NA 140 137 135 136  K 4.1 4.3 3.9 4.2  CL 106 106 106 106  CO2 25 23 22 22   GLUCOSE 119* 100* 109* 171*  BUN 12 14 14 11   CREATININE 1.21 1.10 1.16 1.11  CALCIUM 10.1 9.3 9.4 9.9  GFRNONAA 53* 59* 55* 58*  GFRAA >60 >60 >60 >60  PROT 8.1  --  7.9 8.2*  ALBUMIN 4.4  --  3.9 4.2  AST 29  --  27 27  ALT 16  --  16 15  ALKPHOS 52  --  40 44  BILITOT 0.9  --  1.5* 0.8   RADIOGRAPHIC STUDIES: I have personally reviewed the radiological images as listed and agreed with the findings in the report. 12/15/2017 Bone scan whole body showed new sites of osseous tracer accumulation are identified especially to size at the distal left femoral diaphysis, L2 vertebral body, right iliac bone, right ischium. 01/18/2018 chest abdomen pelvis with contrast Extensive metastasis disease to bone, with all bone lesions new from the abdomen and pelvis CT  dated 9/5 2018 and a chest CT dated 06/19/2013.  Involvement is similar to the recent bone scan.  No other evidence of metastatic disease.  No acute abnormalities in the chest abdomen and pelvis.  ASSESSMENT & PLAN: 84 yo male follows up for management of metastatic prostate cancer and neoplasm related symptoms..  1. Prostate cancer metastatic to bone ([place])   2. Pain of upper extremity, unspecified laterality   3. Bone metastasis ([place])   4. Neoplastic malignant related fatigue   5. Anemia, unspecified type   6. Chest wall pain   7. Weight loss    #Castration resistant prostate cancer, with rising PSA Patient is currently on Xofigo treatment.  Tolerates Xtandi 120mg  daily, continue.  NGS panel sent,result pending.   Proceed with Xgeva and Leupron injection next week.   # chest wall pain / left arm pain/ Neoplasm related pain:  Better controlled after started on Fentanyl patch 69mcg, still using oxycodone every 6 hours. Will increased Fentanyl to 37.100mcg. Will send him another patch of 12.30mcg and instruct patient to apply both 31mcg and 12.6mcg patch.  His pain maybe due to flare after Xofigo treatment or real progression continue to monitor.   #Weight loss/poor appetite/fatigue, increase Megace to 80 mg BID.  TSH was checked in August, normal. Check cortisol level today.   # Anemia, check iron panel.  #Black stool, hemoglobin has been stable..  Previously stool occult blood negative x 3.  He is not interested in colonoscopy. Continue monitor.  Patient has multiple neoplasm related symptoms will see me next week again  for titration of his pain medication/monitoring side effects/Xgeva and leupron treatments.   Orders Placed This Encounter  Procedures  . Iron and TIBC    Standing Status:   Future    Standing Expiration Date:   03/23/2019  . Ferritin    Standing Status:   Future    Standing Expiration Date:   03/23/2019  . Basic metabolic panel    Standing Status:   Future     Standing Expiration Date:   03/22/2019  We spent sufficient time to discuss many aspect of care, questions were answered to patient's satisfaction.  Return of visit: 1 week.   Earlie Server, MD, PhD Hematology Oncology [facility] Pager- [phone] 03/22/2018

## 2018-03-22 NOTE — Progress Notes (Signed)
Patient here today for follow up.   

## 2018-03-23 ENCOUNTER — Ambulatory Visit: Payer: Medicare Other | Admitting: Radiation Oncology

## 2018-03-23 ENCOUNTER — Ambulatory Visit
Admission: RE | Admit: 2018-03-23 | Discharge: 2018-03-23 | Disposition: A | Discharge status: Home / Self Care | Payer: Medicare Other | Source: Ambulatory Visit | Attending: Radiation Oncology | Admitting: Radiation Oncology

## 2018-03-23 DIAGNOSIS — C61 Malignant neoplasm of prostate: Secondary | ICD-10-CM | POA: Diagnosis not present

## 2018-03-23 DIAGNOSIS — C7951 Secondary malignant neoplasm of bone: Secondary | ICD-10-CM | POA: Diagnosis not present

## 2018-03-23 LAB — TESTOSTERONE: Testosterone: <3 ng/dL — ABNORMAL LOW (ref 264–916)

## 2018-03-23 MED ORDER — RADIUM RA 223 DICHLORIDE 30 MCCI/ML IV SOLN
123.9000 | Freq: Once | INTRAVENOUS | Status: AC
  Administered 2018-03-23: 123.9 via INTRAVENOUS

## 2018-03-24 ENCOUNTER — Ambulatory Visit: Payer: Medicare Other | Admitting: Oncology

## 2018-03-25 ENCOUNTER — Encounter: Payer: Self-pay | Admitting: Emergency Medicine

## 2018-03-25 ENCOUNTER — Emergency Department: Payer: Medicare Other

## 2018-03-25 ENCOUNTER — Emergency Department
Admission: EM | Admit: 2018-03-25 | Discharge: 2018-03-25 | Disposition: A | Discharge status: Home / Self Care | Payer: Medicare Other | Attending: Emergency Medicine | Admitting: Emergency Medicine

## 2018-03-25 ENCOUNTER — Other Ambulatory Visit: Payer: Self-pay

## 2018-03-25 DIAGNOSIS — Z87891 Personal history of nicotine dependence: Secondary | ICD-10-CM | POA: Insufficient documentation

## 2018-03-25 DIAGNOSIS — Z8546 Personal history of malignant neoplasm of prostate: Secondary | ICD-10-CM | POA: Diagnosis not present

## 2018-03-25 DIAGNOSIS — I2510 Atherosclerotic heart disease of native coronary artery without angina pectoris: Secondary | ICD-10-CM | POA: Insufficient documentation

## 2018-03-25 DIAGNOSIS — R195 Other fecal abnormalities: Secondary | ICD-10-CM | POA: Diagnosis not present

## 2018-03-25 DIAGNOSIS — I10 Essential (primary) hypertension: Secondary | ICD-10-CM | POA: Diagnosis not present

## 2018-03-25 DIAGNOSIS — R0789 Other chest pain: Secondary | ICD-10-CM | POA: Insufficient documentation

## 2018-03-25 DIAGNOSIS — Z79899 Other long term (current) drug therapy: Secondary | ICD-10-CM | POA: Insufficient documentation

## 2018-03-25 DIAGNOSIS — Z7982 Long term (current) use of aspirin: Secondary | ICD-10-CM | POA: Diagnosis not present

## 2018-03-25 LAB — CBC
HCT: 34.6 % — ABNORMAL LOW (ref 39.0–52.0)
HEMOGLOBIN: 11.6 g/dL — AB (ref 13.0–17.0)
MCH: 28.2 pg (ref 26.0–34.0)
MCHC: 33.5 g/dL (ref 30.0–36.0)
MCV: 84.2 fL (ref 80.0–100.0)
PLATELETS: 206 10*3/uL (ref 150–400)
RBC: 4.11 MIL/uL — ABNORMAL LOW (ref 4.22–5.81)
RDW: 12.9 % (ref 11.5–15.5)
WBC: 7 10*3/uL (ref 4.0–10.5)
nRBC: 0 % (ref 0.0–0.2)

## 2018-03-25 LAB — BASIC METABOLIC PANEL
Anion gap: 10 (ref 5–15)
BUN: 13 mg/dL (ref 8–23)
CO2: 21 mmol/L — ABNORMAL LOW (ref 22–32)
Calcium: 9.3 mg/dL (ref 8.9–10.3)
Chloride: 104 mmol/L (ref 98–111)
Creatinine, Ser: 1.14 mg/dL (ref 0.61–1.24)
GFR calc Af Amer: >60 mL/min (ref >60)
GFR, EST NON AFRICAN AMERICAN: 56 mL/min — AB (ref >60)
Glucose, Bld: 188 mg/dL — ABNORMAL HIGH (ref 70–99)
POTASSIUM: 4.2 mmol/L (ref 3.5–5.1)
Sodium: 135 mmol/L (ref 135–145)

## 2018-03-25 LAB — TROPONIN I

## 2018-03-25 MED ORDER — IOHEXOL 300 MG/ML  SOLN
30.0000 mL | Freq: Once | INTRAMUSCULAR | Status: AC | PRN
  Administered 2018-03-25: 30 mL via ORAL

## 2018-03-25 MED ORDER — IOPAMIDOL (ISOVUE-300) INJECTION 61%
100.0000 mL | Freq: Once | INTRAVENOUS | Status: AC | PRN
  Administered 2018-03-25: 100 mL via INTRAVENOUS

## 2018-03-25 NOTE — ED Provider Notes (Signed)
[facility] Emergency Department Provider Note  ____________________________________________   First MD Initiated Contact with Patient 03/25/18 1326     (approximate)  I have reviewed the triage vital signs and the nursing notes.   HISTORY  Chief Complaint Chest Pain   HPI Matthew Hunter is a 84 y.o. male history of prostate cancer, presenting for ongoing chest pain.  Patient reports he is at roughly 2 months of discomfort in the left side of his chest, takes fentanyl, and oxycodone at home for pain for this.  Additionally, he reports he came today because he noticed that he had a very dark stool today and also had a dark stool about a week and a half ago but in the interim other stools been normal.   Reports the loss of dark stool this morning, but did not notice any blood in it.  He also reports he felt a little constipated so took prune juice as well and not sure why his stools look dark today  Denies history of ulcers.  No known bleeding.  No nausea or vomiting.  Currently not having any pain in his stomach, reports it felt very gassy last night but is better now.  Moved his bowels this morning and still passes gas normally.  Does report chest pain over the left side of his chest but reports that this been present for about 2 months now, he is on pain medication for it he has been seen by the emergency room and also followed by his oncologist for  Denies the pain in the left side of his ribs is new, reports that ongoing.  Occasionally sharp and discomfort.  Past Medical History:  Diagnosis Date  . Arthritis   . Benign enlargement of prostate   . CAD (coronary artery disease)   . Constipation   . Elevated PSA   . GERD (gastroesophageal reflux disease)   . Hypertension   . Incomplete bladder emptying   . Nocturia   . Prostate cancer ([place]) 05/03/2017   Rad tx's and Lupron shots.   . Urgency of micturation   . Urinary frequency     Patient Active  Problem List   Diagnosis Date Noted  . 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 Goals of care, counseling/discussion 12/17/2017  . Prostate cancer ([place]) 05/03/2017  . Arthritis 06/11/2016  . History of alcohol abuse 06/11/2016  . Hx of colonic polyp 06/11/2016  . Hypertension 06/11/2016  . Internal hemorrhoids 06/11/2016  . Elevated PSA 05/16/2015  . BPH (benign prostatic hyperplasia) 11/22/2014  . Incomplete bladder emptying 11/22/2014  . GERD (gastroesophageal reflux disease) 05/09/2008    Past Surgical History:  Procedure Laterality Date  . GALLBLADDER SURGERY    . PROSTATE SURGERY    . STOMACH SURGERY      Prior to Admission medications   Medication Sig Start Date End Date Taking? Authorizing Provider  aspirin EC 81 MG tablet Take by mouth.    [provider]  atenolol (TENORMIN) 100 MG tablet Take 100 mg by mouth daily.     [provider]  Calcium Carb-Cholecalciferol (CALCIUM-VITAMIN D) 500-200 MG-UNIT tablet Take 1 tablet by mouth daily. 12/30/17   Earlie Server, MD  docusate sodium (COLACE) 100 MG capsule Take 1 capsule (100 mg total) by mouth daily. 03/22/18   Earlie Server, MD  dutasteride (AVODART) 0.5 MG capsule Take 1 capsule (0.5 mg total) by mouth daily. 06/25/16   Nori Riis, PA-C  enzalutamide Gillermina Phy) 40 MG capsule Take  3 capsules (120 mg total) by mouth daily. 03/04/18   Earlie Server, MD  fentaNYL (DURAGESIC - DOSED MCG/HR) 12 MCG/HR Place 1 patch (12.5 mcg total) onto the skin every 3 (three) days. 03/22/18   Earlie Server, MD  fentaNYL (DURAGESIC - DOSED MCG/HR) 25 MCG/HR patch Place 1 patch (25 mcg total) onto the skin every 3 (three) days. 03/16/18   Earlie Server, MD  finasteride (PROSCAR) 5 MG tablet Take 1 tablet (5 mg total) by mouth daily. 06/11/16   [name] A, PA-C  hydrocortisone 2.5 % cream APP EXT AA BID 09/02/17   [provider]  megestrol (MEGACE) 40 MG tablet Take 2 tablets (80 mg total) by mouth 2 (two) times daily. 03/22/18    Earlie Server, MD  mirabegron ER (MYRBETRIQ) 25 MG TB24 tablet Take 1 tablet (25 mg total) by mouth daily. 05/04/17   [name] A, PA-C  nortriptyline (PAMELOR) 25 MG capsule Take 25 mg by mouth at bedtime. Reported on 05/16/2015 10/30/14   [provider]  omeprazole (PRILOSEC) 20 MG capsule Take 20 mg by mouth daily.    [provider]  oxyCODONE (ROXICODONE) 5 MG immediate release tablet Take 1 tablet (5 mg total) by mouth every 6 (six) hours as needed for severe pain. 03/14/18   Jacquelin Hawking, NP  polyethylene glycol (MIRALAX / GLYCOLAX) packet Take 17 g by mouth daily. Mix one tablespoon with 8oz of your favorite juice or water every day until you are having soft formed stools. Then start taking once daily if you didn't have a stool the day before. 02/26/18   Merlyn Lot, MD  pyridOXINE (VITAMIN B-6) 50 MG tablet Take 50 mg by mouth daily.     [provider]  tamsulosin (FLOMAX) 0.4 MG CAPS capsule Take 1 capsule (0.4 mg total) by mouth daily. 07/19/17   McGowan, Larene Beach A, PA-C  Telmisartan-Amlodipine 80-5 MG TABS Take 1 tablet by mouth daily.     [provider]    Allergies Patient has no known allergies.  Family History  Problem Relation Age of Onset  . Hypertension Mother   . Stroke Father   . Kidney disease Neg Hx   . Prostate cancer Neg Hx   . Kidney cancer Neg Hx   . Bladder Cancer Neg Hx     Social History Social History   Tobacco Use  . Smoking status: Former Smoker    Last attempt to quit: 06/01/1998    Years since quitting: 19.8  . Smokeless tobacco: Never Used  Substance Use Topics  . Alcohol use: No    Alcohol/week: 0.0 standard drinks  . Drug use: Not Currently    Frequency: 2.0 times per week    Types: Marijuana    Comment: marijuana two time monthly gives him an appetite    Review of Systems Constitutional: No fever/chills Eyes: No visual changes. ENT: No sore throat. Cardiovascular: See HPI Respiratory:  Denies shortness of breath. Gastrointestinal: No abdominal pain presently, but feels very gassy at times relieved by having a bowel movement which today was dark.   Genitourinary: Negative for dysuria. Musculoskeletal: Negative for back pain. Skin: Negative for rash. Neurological: Negative for headaches, areas of focal weakness or numbness.    ____________________________________________   PHYSICAL EXAM:  VITAL SIGNS: ED Triage Vitals  Enc Vitals Group     BP 03/25/18 1150 114/64     Pulse Rate 03/25/18 1150 84     Resp 03/25/18 1150 18  Temp 03/25/18 1150 (!) 97.5 F (36.4 C)     Temp Source 03/25/18 1150 Oral     SpO2 03/25/18 1150 100 %     Weight 03/25/18 1155 177 lb 5 oz (80.4 kg)     Height 03/25/18 1155 5\' 9"  (1.753 m)     Head Circumference --      Peak Flow --      Pain Score 03/25/18 1155 8     Pain Loc --      Pain Edu? --      Excl. in [place]? --     Constitutional: Alert and oriented. Well appearing and in no acute distress.  Patient is wife both very pleasant.  Does not appear in any distress. Eyes: Conjunctivae are normal. Head: Atraumatic. Nose: No congestion/rhinnorhea. Mouth/Throat: Mucous membranes are moist. Neck: No stridor.  Cardiovascular: Normal rate, regular rhythm. Grossly normal heart sounds.  Good peripheral circulation. Respiratory: Normal respiratory effort.  No retractions. Lungs CTAB. Gastrointestinal: Soft and nontender. No distention. Rectal: Formed, brown heme-negative stool.  No evidence of bleeding. Musculoskeletal: No lower extremity tenderness nor edema. Neurologic:  Normal speech and language. No gross focal neurologic deficits are appreciated.  Skin:  Skin is warm, dry and intact. No rash noted. Psychiatric: Mood and affect are normal. Speech and behavior are normal.  ____________________________________________   LABS (all labs ordered are listed, but only abnormal results are displayed)  Labs Reviewed  BASIC METABOLIC  PANEL - Abnormal; Notable for the following components:      Result Value   CO2 21 (*)    Glucose, Bld 188 (*)    GFR calc non Af Amer 56 (*)    All other components within normal limits  CBC - Abnormal; Notable for the following components:   RBC 4.11 (*)    Hemoglobin 11.6 (*)    HCT 34.6 (*)    All other components within normal limits  TROPONIN I  TROPONIN I   ____________________________________________  EKG  I reviewed and interpreted by me at 1152 Heart rate 80 QRS 85 QTc 430 Normal sinus rhythm, no evidence of acute ischemia ____________________________________________  RADIOLOGY  Dg Chest 2 View  Result Date: 03/25/2018 CLINICAL DATA:  Upper anterior chest pain for over LEFT 2 weeks, prostate cancer, coronary artery disease, hypertension, former smoker EXAM: CHEST - 2 VIEW COMPARISON:  02/26/2018 FINDINGS: Normal heart size, mediastinal contours, and pulmonary vascularity. Atherosclerotic calcification and mild tortuosity of thoracic aorta. Lungs clear. No pulmonary infiltrate, pleural effusion or pneumothorax. Old LEFT rib fractures. Sclerotic foci anterior RIGHT sixth rib corresponding to sclerotic osseous metastasis by interval CT. Bowel interposition between liver and diaphragm. IMPRESSION: No acute abnormalities. Old posttraumatic deformities of the LEFT chest wall. Known osseous metastatic disease. Electronically Signed   By: Lavonia Dana M.D.   On: 03/25/2018 12:49   Ct Abdomen Pelvis W Contrast  Result Date: 03/25/2018 CLINICAL DATA:  84 year old with abdominal pain. Shortness of breath. Prostate cancer history. EXAM: CT ABDOMEN AND PELVIS WITH CONTRAST TECHNIQUE: Multidetector CT imaging of the abdomen and pelvis was performed using the standard protocol following bolus administration of intravenous contrast. CONTRAST:  117mL ISOVUE-300 IOPAMIDOL (ISOVUE-300) INJECTION 61% COMPARISON:  CT 01/18/2018 FINDINGS: Lower chest: Lung bases are clear. Hepatobiliary:  Cholecystectomy. Again noted are small hypodensities in liver that probably represent cysts. No suspicious liver lesions and no biliary dilatation. Pancreas: Unremarkable. No pancreatic ductal dilatation or surrounding inflammatory changes. Spleen: Normal in size without focal abnormality. Adrenals/Urinary Tract: Stable nodularity  in the left adrenal gland measuring roughly 1.2 cm. Right adrenal gland is unremarkable. Again noted is a 7 mm stone in the left kidney lower pole without hydronephrosis. Exophytic left renal cyst. Additional small left renal cysts. Normal appearance of the right kidney. Negative for hydronephrosis. Dilatation of the mid right ureter is nonspecific. Urinary bladder is mildly distended and question a large posterior bladder diverticulum. Stomach/Bowel: Stomach is within normal limits. Appendix appears normal. No evidence of bowel wall thickening, distention, or inflammatory changes. Vascular/Lymphatic: Atherosclerotic calcification involving the abdominal aorta without aneurysm. Iliac veins and IVC are patent. There is no significant lymph node enlargement in the abdomen or pelvis. Reproductive: Heterogeneous perfusion in the prostate and findings are nonspecific. Other: No free fluid.  Negative for free air. Musculoskeletal: Scattered sclerotic lesions throughout the skeleton compatible with prostate cancer. Bone disease burden is similar to the prior examination. IMPRESSION: 1. No acute abnormality in the abdomen or pelvis. 2. Extensive metastatic bone disease. Findings are similar to the exam on 01/18/2018. 3. Nonobstructive left kidney stone. 4. Stable left adrenal nodule. Electronically Signed   By: Markus Daft M.D.   On: 03/25/2018 16:57   Dg Abd 2 Views  Result Date: 03/25/2018 CLINICAL DATA:  Patient reports LLQ abdominal pain. Reports he was experiencing some constipation, but he drank prune juice which has improved his constipation. Occasional dark stools per ordering notes.  EXAM: ABDOMEN - 2 VIEW COMPARISON:  CT of the abdomen and pelvis on 01/18/2018 FINDINGS: There is mild dilatation of LEFT central abdominal small bowel loops. Gas is identified throughout nondilated loops of colon. Minimal stool burden. There is no evidence for free intraperitoneal air, organomegaly. Surgical clips are present in the RIGHT UPPER QUADRANT and RIGHT LOWER QUADRANT. Numerous sclerotic lesions throughout the pelvis and proximal femurs and spine are consistent with known osseous metastases. IMPRESSION: 1. Small bowel dilatation consistent with early or partial small bowel obstruction. 2. Osseous metastatic disease. Electronically Signed   By: Nolon Nations M.D.   On: 03/25/2018 15:07    Imaging including chest x-ray abdominal x-ray reviewed by me.  CT scan is notable for no acute abnormalities. ____________________________________________   PROCEDURES  Procedure(s) performed: None  Procedures  Critical Care performed: No  ____________________________________________   INITIAL IMPRESSION / ASSESSMENT AND PLAN / ED COURSE  Pertinent labs & imaging results that were available during my care of the patient were reviewed by me and considered in my medical decision making (see chart for details).   Patient presents for evaluation of dark stools, occasional crampy discomfort.  Currently abdomen soft nontender without any evidence of pain or peritonitis in any quadrant.  Differential diagnosis is broad, but at the present time he reports symptoms are improved he does not have any associated infectious symptoms.  His examination very reassuring, Hemoccult negative.  Differential given being broad, but at the present time asymptomatic and he is concerned about the darkness of his stool which at present time does not show any evidence of bleeding ischemic colitis is also a consideration, but at the present time no evidence of bleeding or pain.  Do not believe CT imaging would be warranted  at this time given asymptomatic status, though he does report ongoing pain in the left side of the chest this is been clearly evaluated in the past and he does not report any change in it.  Currently taking pain medication, has had previous exclusion of pulmonary embolism within the last month, followed with oncology and is  appears to be chronic with today's results insured including EKG chest x-ray and troponin very reassuring  Clinical Course as of Mar 25 1830  Fri Mar 25, 2018  1429 Hemoglobin stable from previous evaluation, Hemoccult negative.   [MQ]    Clinical Course User Index [MQ] Delman Kitten, MD    ----------------------------------------- 6:31 PM on 03/25/2018 -----------------------------------------  CT abdomen pelvis, negative for acute.  Patient resting comfortably.  No further stools, no ongoing nausea vomiting.  Reports some mild ongoing discomfort in the left side of his chest which is chronic  Reassuring lab work and evaluation at this time.  Discussed with patient he will follow-up with oncology and primary care.  Return precautions and treatment recommendations and follow-up discussed with the patient who is agreeable with the plan.  Wife will be bring him home. Patient not driving self. ____________________________________________   FINAL CLINICAL IMPRESSION(S) / ED DIAGNOSES  Final diagnoses:  Chest pain, non-cardiac  Dark stools        Note:  This document was prepared using Dragon voice recognition software and may include unintentional dictation errors       Delman Kitten, MD 03/25/18 1832

## 2018-03-25 NOTE — ED Triage Notes (Addendum)
Pt arrived via POV with reports of chest pain x 2 weeks, pt states sxs worse today, c/o shortness of breath and back pain. Pt also reports decreased appetite.  Pt describes the pain as a jabbing pain. Pt here in the past month for the same pain.  Pt has hx of prostate CA, pt has fentanyl patch on at this time.

## 2018-03-25 NOTE — ED Triage Notes (Signed)
First Nurse Note:  C/O chest pain x 2 weeks.  States pain is across chest.  Patient is AAOx3.  Skin warm and dry. NAD.  No SOB/ DOE.

## 2018-03-28 ENCOUNTER — Inpatient Hospital Stay: Payer: Medicare Other

## 2018-03-28 ENCOUNTER — Other Ambulatory Visit: Payer: Self-pay

## 2018-03-28 ENCOUNTER — Encounter: Payer: Self-pay | Admitting: Oncology

## 2018-03-28 ENCOUNTER — Inpatient Hospital Stay (HOSPITAL_BASED_OUTPATIENT_CLINIC_OR_DEPARTMENT_OTHER): Payer: Medicare Other | Admitting: Oncology

## 2018-03-28 VITALS — BP 99/67 | HR 67 | Temp 96.2°F | Resp 18 | Wt 178.2 lb

## 2018-03-28 DIAGNOSIS — C7951 Secondary malignant neoplasm of bone: Secondary | ICD-10-CM | POA: Diagnosis not present

## 2018-03-28 DIAGNOSIS — Z87891 Personal history of nicotine dependence: Secondary | ICD-10-CM

## 2018-03-28 DIAGNOSIS — Z79899 Other long term (current) drug therapy: Secondary | ICD-10-CM

## 2018-03-28 DIAGNOSIS — Z79818 Long term (current) use of other agents affecting estrogen receptors and estrogen levels: Secondary | ICD-10-CM

## 2018-03-28 DIAGNOSIS — K921 Melena: Secondary | ICD-10-CM

## 2018-03-28 DIAGNOSIS — I2510 Atherosclerotic heart disease of native coronary artery without angina pectoris: Secondary | ICD-10-CM

## 2018-03-28 DIAGNOSIS — Z923 Personal history of irradiation: Secondary | ICD-10-CM | POA: Diagnosis not present

## 2018-03-28 DIAGNOSIS — R530 Neoplastic (malignant) related fatigue: Secondary | ICD-10-CM

## 2018-03-28 DIAGNOSIS — I10 Essential (primary) hypertension: Secondary | ICD-10-CM

## 2018-03-28 DIAGNOSIS — C61 Malignant neoplasm of prostate: Secondary | ICD-10-CM

## 2018-03-28 DIAGNOSIS — Z7982 Long term (current) use of aspirin: Secondary | ICD-10-CM

## 2018-03-28 DIAGNOSIS — R634 Abnormal weight loss: Secondary | ICD-10-CM

## 2018-03-28 DIAGNOSIS — R0789 Other chest pain: Secondary | ICD-10-CM

## 2018-03-28 DIAGNOSIS — R5381 Other malaise: Secondary | ICD-10-CM

## 2018-03-28 DIAGNOSIS — M1990 Unspecified osteoarthritis, unspecified site: Secondary | ICD-10-CM

## 2018-03-28 DIAGNOSIS — D649 Anemia, unspecified: Secondary | ICD-10-CM

## 2018-03-28 DIAGNOSIS — M549 Dorsalgia, unspecified: Secondary | ICD-10-CM

## 2018-03-28 DIAGNOSIS — K219 Gastro-esophageal reflux disease without esophagitis: Secondary | ICD-10-CM

## 2018-03-28 DIAGNOSIS — M79603 Pain in arm, unspecified: Secondary | ICD-10-CM

## 2018-03-28 DIAGNOSIS — R351 Nocturia: Secondary | ICD-10-CM

## 2018-03-28 DIAGNOSIS — M79602 Pain in left arm: Secondary | ICD-10-CM

## 2018-03-28 DIAGNOSIS — G893 Neoplasm related pain (acute) (chronic): Secondary | ICD-10-CM

## 2018-03-28 LAB — IRON AND TIBC
IRON: 72 ug/dL (ref 45–182)
SATURATION RATIOS: 24 % (ref 17.9–39.5)
TIBC: 296 ug/dL (ref 250–450)
UIBC: 224 ug/dL

## 2018-03-28 LAB — BASIC METABOLIC PANEL
ANION GAP: 6 (ref 5–15)
BUN: 19 mg/dL (ref 8–23)
CO2: 20 mmol/L — ABNORMAL LOW (ref 22–32)
Calcium: 9.6 mg/dL (ref 8.9–10.3)
Chloride: 108 mmol/L (ref 98–111)
Creatinine, Ser: 1.16 mg/dL (ref 0.61–1.24)
GFR calc Af Amer: >60 mL/min (ref >60)
GFR, EST NON AFRICAN AMERICAN: 55 mL/min — AB (ref >60)
GLUCOSE: 98 mg/dL (ref 70–99)
Potassium: 4.1 mmol/L (ref 3.5–5.1)
SODIUM: 134 mmol/L — AB (ref 135–145)

## 2018-03-28 LAB — FERRITIN: FERRITIN: 662 ng/mL — AB (ref 24–336)

## 2018-03-28 LAB — PSA: PROSTATIC SPECIFIC ANTIGEN: 341 ng/mL — AB (ref 0.00–4.00)

## 2018-03-28 MED ORDER — LEUPROLIDE ACETATE (6 MONTH) 45 MG IM KIT
45.0000 mg | PACK | Freq: Once | INTRAMUSCULAR | Status: AC
  Administered 2018-03-28: 45 mg via INTRAMUSCULAR
  Filled 2018-03-28: qty 45

## 2018-03-28 MED ORDER — DENOSUMAB 120 MG/1.7ML ~~LOC~~ SOLN
120.0000 mg | Freq: Once | SUBCUTANEOUS | Status: AC
  Administered 2018-03-28: 120 mg via SUBCUTANEOUS
  Filled 2018-03-28: qty 1.7

## 2018-03-28 NOTE — Progress Notes (Signed)
Patient here for follow up. Pt states pain is "a little better." Sill has no appetite. Pt requesting refill for oxycodone.

## 2018-03-28 NOTE — Progress Notes (Signed)
Nutrition Follow-up:  Patient with prostate cancer stage IV with bone mets.  Patient followed by Dr. Tasia Catchings. Noted ED visit on 10/25 for chest pain  Met with patient and wife following MD visit today.  Patient reports appetite maybe slightly better.  Reports food is going down better.  Reports that some foods do not have a taste.  Reports eating shrimp fried rice recently and pizza that tasted good.  Likes canned fruit.  Reports eating pancakes and cereal for breakfast with little bit of bacon.     Medications: reviewed   Labs: Na 134  Anthropometrics:   Weight stable at 178 lb 3.2 oz from last visit on 10/17    NUTRITION DIAGNOSIS: Unintentional weight loss stable   MALNUTRITION DIAGNOSIS: continue to monitor   INTERVENTION:  Reviewed strategies to increase calories and protein Reviewed adding moisture to foods. Encouraged oral supplements 2-3 times per day. 1st case of ensure enlive given today.     MONITORING, EVALUATION, GOAL: weight trends, intake   NEXT VISIT: November 14 after labs   [place]. Zenia Resides, [place], [place] Registered Dietitian [phone] (pager)

## 2018-03-29 NOTE — Progress Notes (Signed)
Hematology/Oncology  Follow up note [facility] [phone] [phone]  Patient Care Team: [place] Pink, MD as PCP - General (Family Medicine) Earlie Server, MD as Medical Oncologist (Medical Oncology)  REASON FOR VISIT Follow up for antineoplasm treatment of Prostate Cancer  HISTORY OF PRESENTING ILLNESS:  Matthew Hunter 84 y.o.  male with PMH listed as below who was referred by [facility] Urology Provider [name] to me for evaluation of clinical prostate cancer and management.  He has a history of elevated PSA and BPH. PSA trends as follows: 11/14/2014 4.3, 11/07/2015 7.1, 05/11/2016 10.4, 06/11/2016 10.6, 07/07/2016 7.8, 01/06/2017 86.8.  He was seen by urology and was diagnosed with clinical prostate cancer given the extreme elevation of PSA and prostate nodule. Biopsy was not pursued. He was treated with [name] on 01/29/2017,  # prostate biopsy on 03/10/2017. 5 out of 6 cores positive for prostate cancer, with highest gleason score group 9 (4+5), + perineural invasion.  Stage IV prostate cancer, multiple osseus metastatic disease. No visceral involvement.   01/18/2018  CT scan image was independently reviewed by me and discussed with patient.  Patient has progression of bone metastatic disease only  Current treatment  Patient got first [place] on 01/29/2018, switched to leupron on 04/13/2015,  Q6 months.  .Testosterone level at 14, castration level.  Upfront Gillermina Phy was added on December 2019 to assist fast disease control (phase II trial [place] B, [facility] Oncol. 2014) S/p palliative RT to lumber spine and SI joints. Xtandi discontinued on due to CNS toxicity in April 2019.  12/09/17 Abiaterone discontinued.  7/ 18/2019 Start Apalutimide 240mg  daily.   Trend of tumor marker  PSA 86.8 -->64.78 -->131--> 242 added Xtanti --> 17.74-->1.82 Xtandi stopped due to CNS toxicity in April 2019. --> 1.79- (added Abiaterone)-> 11.06-->24.53--> 39.48  [Abiaterone stopped, Added Apalutimid]--> 65.71-->88.63--> Xofigo treatment--> 215--> Back on Xtandi reduced dose.   12/09/17 Abiaterone discontinued due to rising PSA and disease progression. 12/16/2017 Start Apalutimide 240mg  daily. Held on 01/13/2018 due to rising PSA, also causing patient to feel dizziness/black out.  Started Xofigo treatment on 02/23/2018. 02/28/2018, back on Xtandi reduced dose 120mg .    INTERVAL HISTORY Patient presents to follow up for management of metastatic prostate cancer and follow-up on his multiple neoplasm related symptoms. #Patient reports tolerating Xtandi reduced dose at 120 mg daily.  Denies any CNS symptoms. #She continues to have left side anterior chest wall pain and left arm pain. His pain medication has been adjusted to fentanyl 37.5 MCG every 72 hours, oxycodone 5 mg every 4-6 hours as needed. Patient had another emergency room visit on 03/25/2018 due to chest pain and noticed very dark stool. Not associated with any nausea or vomiting.  Denies any epigastric pain or any abdominal pain.  Reports feeling gassy. He had troponin tested and EKG which are not indicated for of cardiology etiology.  He had a abdominal chest abdomen and pelvis with contrast done.  Image was independently reviewed by me.  CT abdomen and pelvis showed no acute abnormality in abdomen or pelvis. There is extensive metastatic bone disease.  Findings are similar to the exam on 01/18/2018.  There is a nonobstructive left kidney stone.  Stable left adrenal nodule.  #Recently received Xofigo infusion. #Weight loss and poor appetite: Reports that appetite has not improved much.  I have advised patient to increase his Megace to 80 mg twice daily.  Patient reports that he is only taking 80 mg daily on the days that he  wants to take. Weight has been stable for the past 3 weeks. #Reports that his anterior chest wall pain and left arm pain has slightly improved.  Currently using pain medication  as instructed.  ROS:  Review of Systems  Constitutional: Positive for fatigue and unexpected weight change. Negative for appetite change, chills, diaphoresis and fever.  HENT:   Negative for hearing loss, lump/mass, mouth sores, nosebleeds and sore throat.   Eyes: Negative for eye problems and icterus.  Respiratory: Negative for chest tightness, cough, hemoptysis, shortness of breath and wheezing.   Cardiovascular: Negative for chest pain and leg swelling.       Chest wall pain, and left arm pain.  Gastrointestinal: Negative for abdominal distention, abdominal pain, blood in stool, diarrhea, nausea and rectal pain.  Endocrine: Negative for hot flashes.  Genitourinary: Positive for nocturia. Negative for bladder incontinence, difficulty urinating, dysuria, frequency and hematuria.   Musculoskeletal: Negative for arthralgias, back pain, flank pain, gait problem and myalgias.  Skin: Negative for itching and rash.  Neurological: Negative for dizziness, gait problem, headaches, light-headedness, numbness and seizures.  Hematological: Negative for adenopathy. Does not bruise/bleed easily.  Psychiatric/Behavioral: Negative for confusion, decreased concentration, depression and sleep disturbance. The patient is not nervous/anxious.     MEDICAL HISTORY:  Past Medical History:  Diagnosis Date  . Arthritis   . Benign enlargement of prostate   . CAD (coronary artery disease)   . Constipation   . Elevated PSA   . GERD (gastroesophageal reflux disease)   . Hypertension   . Incomplete bladder emptying   . Nocturia   . Prostate cancer ([place]) 05/03/2017   Rad tx's and Lupron shots.   . Urgency of micturation   . Urinary frequency     SURGICAL HISTORY: Past Surgical History:  Procedure Laterality Date  . GALLBLADDER SURGERY    . PROSTATE SURGERY    . STOMACH SURGERY      SOCIAL HISTORY: Social History   Socioeconomic History  . Marital status: Married    Spouse name: Not on file  .  Number of children: Not on file  . Years of education: Not on file  . Highest education level: Not on file  Occupational History  . Not on file  Social Needs  . Financial resource strain: Not on file  . Food insecurity:    Worry: Not on file    Inability: Not on file  . Transportation needs:    Medical: Not on file    Non-medical: Not on file  Tobacco Use  . Smoking status: Former Smoker    Last attempt to quit: 06/01/1998    Years since quitting: 19.8  . Smokeless tobacco: Never Used  Substance and Sexual Activity  . Alcohol use: No    Alcohol/week: 0.0 standard drinks  . Drug use: Not Currently    Frequency: 2.0 times per week    Types: Marijuana    Comment: marijuana two time monthly gives him an appetite  . Sexual activity: Not on file  Lifestyle  . Physical activity:    Days per week: Not on file    Minutes per session: Not on file  . Stress: Not on file  Relationships  . Social connections:    Talks on phone: Not on file    Gets together: Not on file    Attends religious service: Not on file    Active member of club or organization: Not on file    Attends meetings of clubs or organizations:  Not on file    Relationship status: Not on file  . Intimate partner violence:    Fear of current or ex partner: Not on file    Emotionally abused: Not on file    Physically abused: Not on file    Forced sexual activity: Not on file  Other Topics Concern  . Not on file  Social History Narrative  . Not on file    FAMILY HISTORY: Family History  Problem Relation Age of Onset  . Hypertension Mother   . Stroke Father   . Kidney disease Neg Hx   . Prostate cancer Neg Hx   . Kidney cancer Neg Hx   . Bladder Cancer Neg Hx     ALLERGIES:  has No Known Allergies.  MEDICATIONS:  Current Outpatient Medications  Medication Sig Dispense Refill  . aspirin EC 81 MG tablet Take by mouth.    Marland Kitchen atenolol (TENORMIN) 100 MG tablet Take 100 mg by mouth daily.     . Calcium  Carb-Cholecalciferol (CALCIUM-VITAMIN D) 500-200 MG-UNIT tablet Take 1 tablet by mouth daily. 90 tablet 0  . docusate sodium (COLACE) 100 MG capsule Take 1 capsule (100 mg total) by mouth daily. 30 capsule 1  . dutasteride (AVODART) 0.5 MG capsule Take 1 capsule (0.5 mg total) by mouth daily. 30 capsule 0  . enzalutamide (XTANDI) 40 MG capsule Take 3 capsules (120 mg total) by mouth daily. 90 capsule 0  . fentaNYL (DURAGESIC - DOSED MCG/HR) 12 MCG/HR Place 1 patch (12.5 mcg total) onto the skin every 3 (three) days. 10 patch 0  . fentaNYL (DURAGESIC - DOSED MCG/HR) 25 MCG/HR patch Place 1 patch (25 mcg total) onto the skin every 3 (three) days. 10 patch 0  . finasteride (PROSCAR) 5 MG tablet Take 1 tablet (5 mg total) by mouth daily. 90 tablet 3  . hydrocortisone 2.5 % cream APP EXT AA BID  0  . megestrol (MEGACE) 40 MG tablet Take 2 tablets (80 mg total) by mouth 2 (two) times daily. 120 tablet 0  . mirabegron ER (MYRBETRIQ) 25 MG TB24 tablet Take 1 tablet (25 mg total) by mouth daily. 90 tablet 3  . nortriptyline (PAMELOR) 25 MG capsule Take 25 mg by mouth at bedtime. Reported on 05/16/2015  1  . omeprazole (PRILOSEC) 20 MG capsule Take 20 mg by mouth daily.    Marland Kitchen oxyCODONE (ROXICODONE) 5 MG immediate release tablet Take 1 tablet (5 mg total) by mouth every 6 (six) hours as needed for severe pain. 30 tablet 0  . polyethylene glycol (MIRALAX / GLYCOLAX) packet Take 17 g by mouth daily. Mix one tablespoon with 8oz of your favorite juice or water every day until you are having soft formed stools. Then start taking once daily if you didn't have a stool the day before. 30 each 0  . pyridOXINE (VITAMIN B-6) 50 MG tablet Take 50 mg by mouth daily.     . tamsulosin (FLOMAX) 0.4 MG CAPS capsule Take 1 capsule (0.4 mg total) by mouth daily. 90 capsule 3  . Telmisartan-Amlodipine 80-5 MG TABS Take 1 tablet by mouth daily.      No current facility-administered medications for this visit.        Marland Kitchen  PHYSICAL EXAMINATION: ECOG PERFORMANCE STATUS: 1 - Symptomatic but completely ambulatory Vitals:   03/28/18 1355  BP: 99/67  Pulse: 67  Resp: 18  Temp: (!) 96.2 F (35.7 C)   Filed Weights   03/28/18 1355  Weight: 178 lb  3.2 oz (80.8 kg)   Physical Exam  Constitutional: He is oriented to person, place, and time. No distress.  HENT:  Head: Normocephalic and atraumatic.  Mouth/Throat: Oropharynx is clear and moist. No oropharyngeal exudate.  Eyes: Pupils are equal, round, and reactive to light. Conjunctivae and EOM are normal. No scleral icterus.  Neck: Normal range of motion. Neck supple. No JVD present. No thyromegaly present.  Cardiovascular: Normal rate, regular rhythm and normal heart sounds.  No murmur heard. Pulmonary/Chest: Effort normal. No stridor. No respiratory distress. He has no wheezes. He has no rales. He exhibits no tenderness.  Abdominal: Soft. Bowel sounds are normal. He exhibits no distension and no mass. There is no tenderness. There is no guarding.  Musculoskeletal: Normal range of motion. He exhibits no edema or deformity.  Lymphadenopathy:    He has no cervical adenopathy.  Neurological: He is alert and oriented to person, place, and time. He displays normal reflexes. No cranial nerve deficit. Coordination normal.  Skin: Skin is warm and dry. No rash noted. He is not diaphoretic. No erythema.  Psychiatric: He has a normal mood and affect. His behavior is normal. Thought content normal.   LABORATORY DATA:  I have reviewed the data as listed Lab Results  Component Value Date   WBC 7.0 03/25/2018   HGB 11.6 (L) 03/25/2018   HCT 34.6 (L) 03/25/2018   MCV 84.2 03/25/2018   PLT 206 03/25/2018   Recent Labs    01/13/18 1315  02/28/18 1342 03/14/18 1017 03/25/18 1202 03/28/18 1335  NA 140   < > 135 136 135 134*  K 4.1   < > 3.9 4.2 4.2 4.1  CL 106   < > 106 106 104 108  CO2 25   < > 22 22 21* 20*  GLUCOSE 119*   < > 109* 171* 188* 98  BUN 12    < > 14 11 13 19   CREATININE 1.21   < > 1.16 1.11 1.14 1.16  CALCIUM 10.1   < > 9.4 9.9 9.3 9.6  GFRNONAA 53*   < > 55* 58* 56* 55*  GFRAA >60   < > >60 >60 >60 >60  PROT 8.1  --  7.9 8.2*  --   --   ALBUMIN 4.4  --  3.9 4.2  --   --   AST 29  --  27 27  --   --   ALT 16  --  16 15  --   --   ALKPHOS 52  --  40 44  --   --   BILITOT 0.9  --  1.5* 0.8  --   --    < > = values in this interval not displayed.   RADIOGRAPHIC STUDIES: I have personally reviewed the radiological images as listed and agreed with the findings in the report. 12/15/2017 Bone scan whole body showed new sites of osseous tracer accumulation are identified especially to size at the distal left femoral diaphysis, L2 vertebral body, right iliac bone, right ischium. 01/18/2018 chest abdomen pelvis with contrast Extensive metastasis disease to bone, with all bone lesions new from the abdomen and pelvis CT dated 9/5 2018 and a chest CT dated 06/19/2013.  Involvement is similar to the recent bone scan.  No other evidence of metastatic disease.  No acute abnormalities in the chest abdomen and pelvis.  03/14/2018 CT chest Angio PE protocol 1. No acute cardiopulmonary abnormalities. No evidence for acute pulmonary embolus 2. Coronary artery  atherosclerotic calcifications 3.  Aortic Atherosclerosis (ICD10-I70.0). 4. Sclerotic bone metastasis as noted previously  03/25/2018 CT abdomen pelvis with contrast  1. No acute abnormality in the abdomen or pelvis. 2. Extensive metastatic bone disease. Findings are similar to theexam on 01/18/2018.  3. Nonobstructive left kidney stone. 4. Stable left adrenal nodule.  ASSESSMENT & PLAN: 85 yo male follows up for management of metastatic prostate cancer and neoplasm related symptoms..  1. Pain of upper extremity, unspecified laterality   2. Bone metastasis ([place])   3. Neoplastic malignant related fatigue   4. Prostate cancer metastatic to bone ([place])   5. Anemia, unspecified type   6.  Chest wall pain   7. Weight loss   8. Neoplasm related pain   9. Black stool    #Castration resistant prostate cancer, with rising PSA.  Currently on Xofigo treatment. He tolerates Xtandi 120 mg daily.  NGS panel was sent.  Results pending. #Bone metastasis proceed with Xgeva and Lupron injection today. #Chest wall pain/left arm pain/neoplasm related pain. Reports that pain is slightly better after increase fentanyl patch to 37.5 MCG every 72 hours.  He also takes about 2 to 3 pills of oxycodone on average during the day.  I feel that he is increased pain could be due to acute flare after [facility] treatment.  Rising PSA could also be due to flare.  Recent CT chest PE protocol and CT abdomen pelvis were independently reviewed and discussed with patient.  He does not have signs of visceral metastasis.  He does have extensive bone metastasis and currently is on Xofigo treatment.  #Black stool, we have discussed previously about gastroenterology work-up for black stool.  Patient declines due to his advanced age. He had fecal occult blood tested which were negative x3 backing August 2019.  Continue monitor.   #Weight loss/poor appetite/fatigue, advised patient to start increase Megace to 80 mg BID.  TSH was checked in August, normal.  Cortisol level normal.  # Anemia, iron panel reviewed not consistent with iron deficiency.  This is an anemia of chronic disease. We spent sufficient time to discuss many aspect of care, questions were answered to patient's satisfaction. Orders Placed This Encounter  Procedures  . CBC with Differential/Platelet    Standing Status:   Future    Standing Expiration Date:   03/30/2019   Return of visit: 2 weeks  Earlie Server, MD, PhD Hematology Oncology [facility] Pager- [phone] 03/29/2018

## 2018-04-01 ENCOUNTER — Other Ambulatory Visit: Payer: Self-pay | Admitting: Oncology

## 2018-04-01 ENCOUNTER — Ambulatory Visit: Payer: Medicare Other

## 2018-04-01 MED ORDER — OXYCODONE HCL 5 MG PO TABS: 5.0000 mg | ORAL_TABLET | Freq: Four times a day (QID) | ORAL | 0 refills | Status: DC | PRN

## 2018-04-05 ENCOUNTER — Telehealth: Payer: Self-pay

## 2018-04-05 NOTE — Telephone Encounter (Signed)
Received Vocemail from [employer] at [facility] stating that she needed to verify which specimen needed to be tested. Called [facility] and spoke to [facility] and notified her that per Dr. Tasia Catchings, Part B was to be tested. Specimen ID [id number] collected on 03/10/17.

## 2018-04-07 ENCOUNTER — Telehealth: Payer: Self-pay | Admitting: Pharmacy Technician

## 2018-04-07 NOTE — Telephone Encounter (Signed)
Oral Oncology Patient Advocate Encounter   Was successful in securing patient a $4250 grant from [employer] to provide copayment coverage for Xtandi.  This will keep the out of pocket expense at $0.    The billing information is as follows:  Member ID: [id number]  Group ID: [id number] RxBin: [id number] PCN: [id number] Eligibility Date: 04/07/18-04/08/19   Matthew Hunter [place] Patient [place] Phone [phone] Fax [phone] 04/07/2018 9:05 AM

## 2018-04-12 ENCOUNTER — Inpatient Hospital Stay: Payer: Medicare Other

## 2018-04-12 ENCOUNTER — Encounter: Payer: Self-pay | Admitting: Oncology

## 2018-04-12 ENCOUNTER — Other Ambulatory Visit: Payer: Self-pay

## 2018-04-12 ENCOUNTER — Inpatient Hospital Stay: Payer: Medicare Other | Attending: Oncology | Admitting: Oncology

## 2018-04-12 VITALS — BP 116/72 | HR 77 | Temp 96.3°F | Resp 18 | Wt 175.4 lb

## 2018-04-12 DIAGNOSIS — C7951 Secondary malignant neoplasm of bone: Secondary | ICD-10-CM

## 2018-04-12 DIAGNOSIS — Z87891 Personal history of nicotine dependence: Secondary | ICD-10-CM | POA: Diagnosis not present

## 2018-04-12 DIAGNOSIS — M79603 Pain in arm, unspecified: Secondary | ICD-10-CM

## 2018-04-12 DIAGNOSIS — I10 Essential (primary) hypertension: Secondary | ICD-10-CM

## 2018-04-12 DIAGNOSIS — Z79899 Other long term (current) drug therapy: Secondary | ICD-10-CM | POA: Diagnosis not present

## 2018-04-12 DIAGNOSIS — C61 Malignant neoplasm of prostate: Secondary | ICD-10-CM

## 2018-04-12 DIAGNOSIS — G893 Neoplasm related pain (acute) (chronic): Secondary | ICD-10-CM

## 2018-04-12 DIAGNOSIS — D649 Anemia, unspecified: Secondary | ICD-10-CM

## 2018-04-12 DIAGNOSIS — R634 Abnormal weight loss: Secondary | ICD-10-CM

## 2018-04-12 DIAGNOSIS — R530 Neoplastic (malignant) related fatigue: Secondary | ICD-10-CM

## 2018-04-12 DIAGNOSIS — Z7982 Long term (current) use of aspirin: Secondary | ICD-10-CM | POA: Diagnosis not present

## 2018-04-12 DIAGNOSIS — R0789 Other chest pain: Secondary | ICD-10-CM

## 2018-04-12 DIAGNOSIS — K921 Melena: Secondary | ICD-10-CM

## 2018-04-12 LAB — CBC WITH DIFFERENTIAL/PLATELET
Abs Immature Granulocytes: 0.03 10*3/uL (ref 0.00–0.07)
BASOS ABS: 0 10*3/uL (ref 0.0–0.1)
Basophils Relative: 0 %
Eosinophils Absolute: 0.1 10*3/uL (ref 0.0–0.5)
Eosinophils Relative: 1 %
HEMATOCRIT: 31.1 % — AB (ref 39.0–52.0)
HEMOGLOBIN: 10.4 g/dL — AB (ref 13.0–17.0)
Immature Granulocytes: 1 %
LYMPHS PCT: 14 %
Lymphs Abs: 0.8 10*3/uL (ref 0.7–4.0)
MCH: 27.8 pg (ref 26.0–34.0)
MCHC: 33.4 g/dL (ref 30.0–36.0)
MCV: 83.2 fL (ref 80.0–100.0)
Monocytes Absolute: 0.5 10*3/uL (ref 0.1–1.0)
Monocytes Relative: 10 %
NRBC: 0 % (ref 0.0–0.2)
Neutro Abs: 4.1 10*3/uL (ref 1.7–7.7)
Neutrophils Relative %: 74 %
Platelets: 184 10*3/uL (ref 150–400)
RBC: 3.74 MIL/uL — AB (ref 4.22–5.81)
RDW: 13.1 % (ref 11.5–15.5)
WBC: 5.5 10*3/uL (ref 4.0–10.5)

## 2018-04-12 MED ORDER — FENTANYL 25 MCG/HR TD PT72: 25.0000 ug | MEDICATED_PATCH | TRANSDERMAL | 0 refills | Status: DC

## 2018-04-12 MED ORDER — SENNOSIDES-DOCUSATE SODIUM 8.6-50 MG PO TABS: 2.0000 | ORAL_TABLET | Freq: Every day | ORAL | 3 refills | Status: AC

## 2018-04-12 NOTE — Progress Notes (Signed)
Hematology/Oncology  Follow up note [facility] [phone] [phone]  Patient Care Team: [place] Pink, MD as PCP - General (Family Medicine) Earlie Server, MD as Medical Oncologist (Medical Oncology)  REASON FOR VISIT Follow up for antineoplasm treatment of Prostate Cancer  HISTORY OF PRESENTING ILLNESS:  Matthew Hunter 84 y.o.  male with PMH listed as below who was referred by [facility] Urology Provider [name] to me for evaluation of clinical prostate cancer and management.  He has a history of elevated PSA and BPH. PSA trends as follows: 11/14/2014 4.3, 11/07/2015 7.1, 05/11/2016 10.4, 06/11/2016 10.6, 07/07/2016 7.8, 01/06/2017 86.8.  He was seen by urology and was diagnosed with clinical prostate cancer given the extreme elevation of PSA and prostate nodule. Biopsy was not pursued. He was treated with [name] on 01/29/2017,  # prostate biopsy on 03/10/2017. 5 out of 6 cores positive for prostate cancer, with highest gleason score group 9 (4+5), + perineural invasion.  Stage IV prostate cancer, multiple osseus metastatic disease. No visceral involvement.   01/18/2018  CT scan image was independently reviewed by me and discussed with patient.  Patient has progression of bone metastatic disease only  Current treatment  Patient got first [place] on 01/29/2018, switched to leupron on 04/13/2015,  Q6 months.  .Testosterone level at 14, castration level.  Upfront Gillermina Phy was added on December 2019 to assist fast disease control (phase II trial [place] B, [facility] Oncol. 2014) S/p palliative RT to lumber spine and SI joints. Xtandi discontinued on due to CNS toxicity in April 2019.  12/09/17 Abiaterone discontinued.  7/ 18/2019 Start Apalutimide 240mg  daily.   Trend of tumor marker  PSA 86.8 -->64.78 -->131--> 242 added Xtanti --> 17.74-->1.82 Xtandi stopped due to CNS toxicity in April 2019. --> 1.79- (added Abiaterone)-> 11.06-->24.53--> 39.48  [Abiaterone stopped, Added Apalutimid]--> 65.71-->88.63--> Xofigo treatment--> 215--> Back on Xtandi reduced dose.   12/09/17 Abiaterone discontinued due to rising PSA and disease progression. 12/16/2017 Start Apalutimide 240mg  daily. Held on 01/13/2018 due to rising PSA, also causing patient to feel dizziness/black out.  Started Xofigo treatment on 02/23/2018. 02/28/2018, back on Xtandi reduced dose 120mg .    INTERVAL HISTORY Patient presents to follow up for management of metastatic prostate cancer and follow-up on his multiple prostate cancer related symptoms and management of prostate cancer.   # Currently on Xtandi reduced dose at 120mg  daily. Denies any CNS symptoms.   # He continues to have bone pain. Reports " pain moves around, on his left side chest wall, lower back, arm pain". Reports pain level is around 5-7. He uses fentanyl patch 37.35mcg Q72 hour, also takes oxycodone 5mg  as needed. He is not a good historian. He says "yesterday I used 3 oxycodone tablets". When asking him on average how many oxycodone he uses, he answers 5. Current pain regimen improves with his pain. Requests refill of his fentanyl patches.   # He has got 2 Xofigo infusions so far. Reports feeling weak and tired.  # appetite is poor. Not eating well. # Weight loss, lost another 3 pounds during the past 2 weeks.  I have advised him to take Magace 80mg  BID. Patient appears still taking Megace 80mg  daily.   # constipation. He takes colace daily and also miralax daily. Has BM every other day.    ROS:  Review of Systems  Constitutional: Positive for appetite change, fatigue and unexpected weight change. Negative for chills, diaphoresis and fever.  HENT:   Negative for hearing loss, lump/mass,  mouth sores, nosebleeds and sore throat.   Eyes: Negative for eye problems and icterus.  Respiratory: Negative for chest tightness, cough, hemoptysis, shortness of breath and wheezing.   Cardiovascular: Negative for chest  pain and leg swelling.       Chest wall pain, and left arm pain.  Gastrointestinal: Positive for constipation. Negative for abdominal distention, abdominal pain, blood in stool, diarrhea, nausea and rectal pain.  Endocrine: Negative for hot flashes.  Genitourinary: Positive for nocturia. Negative for bladder incontinence, difficulty urinating, dysuria, frequency and hematuria.   Musculoskeletal: Negative for arthralgias, back pain, flank pain, gait problem and myalgias.  Skin: Negative for itching and rash.  Neurological: Negative for dizziness, gait problem, headaches, light-headedness, numbness and seizures.  Hematological: Negative for adenopathy. Does not bruise/bleed easily.  Psychiatric/Behavioral: Negative for confusion, decreased concentration, depression and sleep disturbance. The patient is not nervous/anxious.     MEDICAL HISTORY:  Past Medical History:  Diagnosis Date  . Arthritis   . Benign enlargement of prostate   . CAD (coronary artery disease)   . Constipation   . Elevated PSA   . GERD (gastroesophageal reflux disease)   . Hypertension   . Incomplete bladder emptying   . Nocturia   . Prostate cancer ([place]) 05/03/2017   Rad tx's and Lupron shots.   . Urgency of micturation   . Urinary frequency     SURGICAL HISTORY: Past Surgical History:  Procedure Laterality Date  . GALLBLADDER SURGERY    . PROSTATE SURGERY    . STOMACH SURGERY      SOCIAL HISTORY: Social History   Socioeconomic History  . Marital status: Married    Spouse name: Not on file  . Number of children: Not on file  . Years of education: Not on file  . Highest education level: Not on file  Occupational History  . Not on file  Social Needs  . Financial resource strain: Not on file  . Food insecurity:    Worry: Not on file    Inability: Not on file  . Transportation needs:    Medical: Not on file    Non-medical: Not on file  Tobacco Use  . Smoking status: Former Smoker    Last attempt  to quit: 06/01/1998    Years since quitting: 19.8  . Smokeless tobacco: Never Used  Substance and Sexual Activity  . Alcohol use: No    Alcohol/week: 0.0 standard drinks  . Drug use: Not Currently    Frequency: 2.0 times per week    Types: Marijuana    Comment: marijuana two time monthly gives him an appetite  . Sexual activity: Not on file  Lifestyle  . Physical activity:    Days per week: Not on file    Minutes per session: Not on file  . Stress: Not on file  Relationships  . Social connections:    Talks on phone: Not on file    Gets together: Not on file    Attends religious service: Not on file    Active member of club or organization: Not on file    Attends meetings of clubs or organizations: Not on file    Relationship status: Not on file  . Intimate partner violence:    Fear of current or ex partner: Not on file    Emotionally abused: Not on file    Physically abused: Not on file    Forced sexual activity: Not on file  Other Topics Concern  . Not on file  Social History Narrative  . Not on file    FAMILY HISTORY: Family History  Problem Relation Age of Onset  . Hypertension Mother   . Stroke Father   . Kidney disease Neg Hx   . Prostate cancer Neg Hx   . Kidney cancer Neg Hx   . Bladder Cancer Neg Hx     ALLERGIES:  has No Known Allergies.  MEDICATIONS:  Current Outpatient Medications  Medication Sig Dispense Refill  . aspirin EC 81 MG tablet Take by mouth.    Marland Kitchen atenolol (TENORMIN) 100 MG tablet Take 100 mg by mouth daily.     . Calcium Carb-Cholecalciferol (CALCIUM-VITAMIN D) 500-200 MG-UNIT tablet Take 1 tablet by mouth daily. 90 tablet 0  . dutasteride (AVODART) 0.5 MG capsule Take 1 capsule (0.5 mg total) by mouth daily. 30 capsule 0  . enzalutamide (XTANDI) 40 MG capsule Take 3 capsules (120 mg total) by mouth daily. 90 capsule 0  . fentaNYL (DURAGESIC - DOSED MCG/HR) 12 MCG/HR Place 1 patch (12.5 mcg total) onto the skin every 3 (three) days. 10  patch 0  . fentaNYL (DURAGESIC - DOSED MCG/HR) 25 MCG/HR patch Place 1 patch (25 mcg total) onto the skin every 3 (three) days. 10 patch 0  . finasteride (PROSCAR) 5 MG tablet Take 1 tablet (5 mg total) by mouth daily. 90 tablet 3  . hydrocortisone 2.5 % cream APP EXT AA BID  0  . megestrol (MEGACE) 40 MG tablet Take 2 tablets (80 mg total) by mouth 2 (two) times daily. 120 tablet 0  . mirabegron ER (MYRBETRIQ) 25 MG TB24 tablet Take 1 tablet (25 mg total) by mouth daily. 90 tablet 3  . mirtazapine (REMERON) 15 MG tablet Take 15 mg by mouth at bedtime.  11  . nortriptyline (PAMELOR) 25 MG capsule Take 25 mg by mouth at bedtime. Reported on 05/16/2015  1  . omeprazole (PRILOSEC) 20 MG capsule Take 20 mg by mouth daily.    Marland Kitchen oxyCODONE (ROXICODONE) 5 MG immediate release tablet Take 1 tablet (5 mg total) by mouth every 6 (six) hours as needed for severe pain. 60 tablet 0  . polyethylene glycol (MIRALAX / GLYCOLAX) packet Take 17 g by mouth daily. Mix one tablespoon with 8oz of your favorite juice or water every day until you are having soft formed stools. Then start taking once daily if you didn't have a stool the day before. 30 each 0  . pyridOXINE (VITAMIN B-6) 50 MG tablet Take 50 mg by mouth daily.     . tamsulosin (FLOMAX) 0.4 MG CAPS capsule Take 1 capsule (0.4 mg total) by mouth daily. 90 capsule 3  . Telmisartan-Amlodipine 80-5 MG TABS Take 1 tablet by mouth daily.     Marland Kitchen senna-docusate (SENOKOT-S) 8.6-50 MG tablet Take 2 tablets by mouth daily. 60 tablet 3   No current facility-administered medications for this visit.       Marland Kitchen  PHYSICAL EXAMINATION: ECOG PERFORMANCE STATUS: 1 - Symptomatic but completely ambulatory Vitals:   04/12/18 0949  BP: 116/72  Pulse: 77  Resp: 18  Temp: (!) 96.3 F (35.7 C)   Filed Weights   04/12/18 0949  Weight: 175 lb 6.4 oz (79.6 kg)   Physical Exam  Constitutional: He is oriented to person, place, and time. No distress.  HENT:  Head:  Normocephalic and atraumatic.  Mouth/Throat: Oropharynx is clear and moist. No oropharyngeal exudate.  Eyes: Pupils are equal, round, and reactive to light. Conjunctivae and EOM are  normal. No scleral icterus.  Neck: Normal range of motion. Neck supple. No thyromegaly present.  Cardiovascular: Normal rate, regular rhythm and normal heart sounds.  No murmur heard. Pulmonary/Chest: Effort normal and breath sounds normal. No respiratory distress.  Abdominal: Soft. Bowel sounds are normal. He exhibits no distension.  Musculoskeletal: Normal range of motion. He exhibits no edema or deformity.  Neurological: He is alert and oriented to person, place, and time. No cranial nerve deficit.  Skin: Skin is warm and dry. He is not diaphoretic. No erythema.  Psychiatric: He has a normal mood and affect.   LABORATORY DATA:  I have reviewed the data as listed Lab Results  Component Value Date   WBC 5.5 04/12/2018   HGB 10.4 (L) 04/12/2018   HCT 31.1 (L) 04/12/2018   MCV 83.2 04/12/2018   PLT 184 04/12/2018   Recent Labs    01/13/18 1315  02/28/18 1342 03/14/18 1017 03/25/18 1202 03/28/18 1335  NA 140   < > 135 136 135 134*  K 4.1   < > 3.9 4.2 4.2 4.1  CL 106   < > 106 106 104 108  CO2 25   < > 22 22 21* 20*  GLUCOSE 119*   < > 109* 171* 188* 98  BUN 12   < > 14 11 13 19   CREATININE 1.21   < > 1.16 1.11 1.14 1.16  CALCIUM 10.1   < > 9.4 9.9 9.3 9.6  GFRNONAA 53*   < > 55* 58* 56* 55*  GFRAA >60   < > >60 >60 >60 >60  PROT 8.1  --  7.9 8.2*  --   --   ALBUMIN 4.4  --  3.9 4.2  --   --   AST 29  --  27 27  --   --   ALT 16  --  16 15  --   --   ALKPHOS 52  --  40 44  --   --   BILITOT 0.9  --  1.5* 0.8  --   --    < > = values in this interval not displayed.   RADIOGRAPHIC STUDIES: I have personally reviewed the radiological images as listed and agreed with the findings in the report. 12/15/2017 Bone scan whole body showed new sites of osseous tracer accumulation are identified  especially to size at the distal left femoral diaphysis, L2 vertebral body, right iliac bone, right ischium. 01/18/2018 chest abdomen pelvis with contrast Extensive metastasis disease to bone, with all bone lesions new from the abdomen and pelvis CT dated 9/5 2018 and a chest CT dated 06/19/2013.  Involvement is similar to the recent bone scan.  No other evidence of metastatic disease.  No acute abnormalities in the chest abdomen and pelvis.  03/14/2018 CT chest Angio PE protocol 1. No acute cardiopulmonary abnormalities. No evidence for acute pulmonary embolus 2. Coronary artery atherosclerotic calcifications 3.  Aortic Atherosclerosis (ICD10-I70.0). 4. Sclerotic bone metastasis as noted previously  03/25/2018 CT abdomen pelvis with contrast  1. No acute abnormality in the abdomen or pelvis. 2. Extensive metastatic bone disease. Findings are similar to theexam on 01/18/2018.  3. Nonobstructive left kidney stone. 4. Stable left adrenal nodule.  ASSESSMENT & PLAN: 84 yo male follows up for management of metastatic prostate cancer and neoplasm related symptoms..  1. Prostate cancer ([place])   2. Bone metastasis ([place])   3. Prostate cancer metastatic to bone ([place])   4. Neoplasm related pain  5. Weight loss   6. Anemia, unspecified type   7. Neoplastic malignant related fatigue    #Castration resistant prostate cancer, PSA rising.  S/p 2 out of 6 planned [facility] treatment.  Tolerates Xtandi 120mg  daily.  NGS is pending.  Rising PSA, discussed with patient that PSA is not a good indicator during Xofigo treatment due to flare phenomenon.  His ALP has been stable, not elevated.  Recent CT chest/Abd/Pelvis was independantly reviewed. Bone scan showed extensive bone metastatic disease, no visceral metastatic disease.   # Goal of care: patient's family asks if Trudi Ida does not work, what will be his other options. discussed with patient and family that chemotherapy will be patient's options.  Patient understands that his disease is not curable. If he progresses on current treatment, and if he is not fit enough for chemotherapy, then hospice should be considered.   # Anemia, hemoglobin decreased to 10.4, likely due to [place].  # Fatigue, multifactorial due to neoplasm and worsening of anemia.  # Weight loss/poor appetite. Advise patient to increase Megace to 80mg  BID.  # Neoplasm related pain, continue Fentanyl 37.57mcg and oxycodone 5mg  Q6h as needed. Will refill Fentanyl 38mcg patch.  # Bone metastatic disease, on Xgeva Q4-6 weeks.  # Narcotic induced constipation, adjust her bowel regimen to Senna 17.6mg  daily with miralax PRN.  Orders Placed This Encounter  Procedures  . CBC with Differential/Platelet    Standing Status:   Future    Standing Expiration Date:   04/13/2019  . Comprehensive metabolic panel    Standing Status:   Future    Standing Expiration Date:   04/13/2019  . PSA    Standing Status:   Future    Standing Expiration Date:   04/13/2019  We spent sufficient time to discuss many aspect of care, questions were answered to patient's satisfaction.  Return of visit: 4 weeks.    Earlie Server, MD, PhD Hematology Oncology [facility] Pager- [phone] 04/12/2018

## 2018-04-12 NOTE — Progress Notes (Signed)
Patient here for follow up. Requesting refill for xtandi.

## 2018-04-14 ENCOUNTER — Other Ambulatory Visit: Payer: Self-pay | Admitting: *Deleted

## 2018-04-14 ENCOUNTER — Inpatient Hospital Stay: Payer: Medicare Other

## 2018-04-14 DIAGNOSIS — C61 Malignant neoplasm of prostate: Secondary | ICD-10-CM | POA: Diagnosis not present

## 2018-04-14 DIAGNOSIS — C7951 Secondary malignant neoplasm of bone: Secondary | ICD-10-CM

## 2018-04-14 LAB — CBC WITH DIFFERENTIAL/PLATELET
Abs Immature Granulocytes: 0.02 10*3/uL (ref 0.00–0.07)
BASOS PCT: 0 %
Basophils Absolute: 0 10*3/uL (ref 0.0–0.1)
EOS PCT: 1 %
Eosinophils Absolute: 0.1 10*3/uL (ref 0.0–0.5)
HEMATOCRIT: 29.8 % — AB (ref 39.0–52.0)
Hemoglobin: 10.1 g/dL — ABNORMAL LOW (ref 13.0–17.0)
Immature Granulocytes: 0 %
Lymphocytes Relative: 15 %
Lymphs Abs: 0.7 10*3/uL (ref 0.7–4.0)
MCH: 28 pg (ref 26.0–34.0)
MCHC: 33.9 g/dL (ref 30.0–36.0)
MCV: 82.5 fL (ref 80.0–100.0)
Monocytes Absolute: 0.5 10*3/uL (ref 0.1–1.0)
Monocytes Relative: 10 %
NEUTROS PCT: 74 %
NRBC: 0 % (ref 0.0–0.2)
Neutro Abs: 3.5 10*3/uL (ref 1.7–7.7)
Platelets: 174 10*3/uL (ref 150–400)
RBC: 3.61 MIL/uL — ABNORMAL LOW (ref 4.22–5.81)
RDW: 13 % (ref 11.5–15.5)
WBC: 4.7 10*3/uL (ref 4.0–10.5)

## 2018-04-14 NOTE — Progress Notes (Signed)
Nutrition Follow-up:  Patient with prostate cancer stage IV with bone mets.  Patient followed by Dr. Tasia Catchings.    Met with patient and wife following labs this am.  Patient reports appetite is about the same.  "I have to force myself to eat."  Reports that food sometimes feels like it gets stuck in mid chest area and then goes on down.  Reports that he is drinking 2-3 ensure enlive per day.  Reports ate pancake with butter and syrup with bacon yesterday am and chicken salad sandwich for supper last night.  Can't remember if ate anything else yesterday.  Ate oatmeal this am for breakfast.   Medications: remeron and megace (increased), pt reports that he is taking medications.    Labs: reviewed  Anthropometrics:   Weight today 175 lb 8 oz in clinic decreased from 178 lb 3.2 oz on 10/28.     NUTRITION DIAGNOSIS: Unintentional weight loss continues   INTERVENTION:  Discussed ways to increase calories and protein when eating foods (ie if oatmeal add butter, mix with whole milk or half and half, add Tbsp of peanut butter or nuts and fruit).   Gave additional case of ensure enlive today.  Patient still complains of food feels like getting stuck at times in mid esophagus area.  Will send message to Dr Tasia Catchings.   MONITORING, EVALUATION, GOAL: weight trends, intake   NEXT VISIT: December 12 after labs  Kaydance Bowie B. Zenia Resides, [place], [place] Registered Dietitian [phone] (pager)

## 2018-04-15 ENCOUNTER — Other Ambulatory Visit: Payer: Self-pay | Admitting: *Deleted

## 2018-04-15 DIAGNOSIS — C61 Malignant neoplasm of prostate: Secondary | ICD-10-CM

## 2018-04-15 MED ORDER — ENZALUTAMIDE 40 MG PO CAPS: 120.0000 mg | ORAL_CAPSULE | Freq: Every day | ORAL | 1 refills | Status: DC

## 2018-04-15 MED ORDER — ENZALUTAMIDE 40 MG PO CAPS: 120.0000 mg | ORAL_CAPSULE | Freq: Every day | ORAL | 0 refills | Status: DC

## 2018-04-15 NOTE — Addendum Note (Signed)
Addended by: Darl Pikes on: 04/15/2018 04:53 PM   Modules accepted: Orders

## 2018-04-15 NOTE — Telephone Encounter (Signed)
Oral Chemotherapy Pharmacist Encounter   Refill sent to [facility] be mistake. Patient fills med at [facility] through his patient assistance, refill redirected there.  Darl Pikes, PharmD, BCPS, [facility] Hematology/Oncology Clinical Pharmacist [facility]/HP/AP Oral [facility] [phone]  04/15/2018 4:52 PM

## 2018-04-18 ENCOUNTER — Encounter: Payer: Self-pay | Admitting: Oncology

## 2018-04-20 ENCOUNTER — Encounter
Admission: RE | Admit: 2018-04-20 | Discharge: 2018-04-20 | Disposition: A | Discharge status: Home / Self Care | Payer: Medicare Other | Source: Ambulatory Visit | Attending: Radiation Oncology | Admitting: Radiation Oncology

## 2018-04-20 ENCOUNTER — Ambulatory Visit: Payer: Medicare Other | Admitting: Radiation Oncology

## 2018-04-20 DIAGNOSIS — C7951 Secondary malignant neoplasm of bone: Secondary | ICD-10-CM | POA: Insufficient documentation

## 2018-04-20 DIAGNOSIS — C61 Malignant neoplasm of prostate: Secondary | ICD-10-CM | POA: Insufficient documentation

## 2018-04-20 MED ORDER — RADIUM RA 223 DICHLORIDE 30 MCCI/ML IV SOLN
122.0000 | Freq: Once | INTRAVENOUS | Status: AC
  Administered 2018-04-20: 122 via INTRAVENOUS

## 2018-04-26 ENCOUNTER — Other Ambulatory Visit: Payer: Self-pay | Admitting: Oncology

## 2018-04-26 MED ORDER — FENTANYL 12 MCG/HR TD PT72: 12.5000 ug | MEDICATED_PATCH | TRANSDERMAL | 0 refills | Status: DC

## 2018-04-26 MED ORDER — OXYCODONE HCL 5 MG PO TABS: 5.0000 mg | ORAL_TABLET | Freq: Four times a day (QID) | ORAL | 0 refills | Status: DC | PRN

## 2018-05-10 ENCOUNTER — Encounter: Payer: Self-pay | Admitting: Oncology

## 2018-05-10 ENCOUNTER — Inpatient Hospital Stay: Payer: Medicare Other | Attending: Oncology

## 2018-05-10 ENCOUNTER — Inpatient Hospital Stay (HOSPITAL_BASED_OUTPATIENT_CLINIC_OR_DEPARTMENT_OTHER): Payer: Medicare Other | Admitting: Oncology

## 2018-05-10 ENCOUNTER — Other Ambulatory Visit: Payer: Self-pay

## 2018-05-10 VITALS — BP 115/68 | HR 72 | Temp 96.3°F | Resp 18 | Wt 174.1 lb

## 2018-05-10 DIAGNOSIS — C7951 Secondary malignant neoplasm of bone: Secondary | ICD-10-CM | POA: Insufficient documentation

## 2018-05-10 DIAGNOSIS — R530 Neoplastic (malignant) related fatigue: Secondary | ICD-10-CM | POA: Insufficient documentation

## 2018-05-10 DIAGNOSIS — R197 Diarrhea, unspecified: Secondary | ICD-10-CM | POA: Diagnosis not present

## 2018-05-10 DIAGNOSIS — R630 Anorexia: Secondary | ICD-10-CM

## 2018-05-10 DIAGNOSIS — C61 Malignant neoplasm of prostate: Secondary | ICD-10-CM

## 2018-05-10 DIAGNOSIS — I2510 Atherosclerotic heart disease of native coronary artery without angina pectoris: Secondary | ICD-10-CM | POA: Diagnosis not present

## 2018-05-10 DIAGNOSIS — G893 Neoplasm related pain (acute) (chronic): Secondary | ICD-10-CM | POA: Diagnosis not present

## 2018-05-10 DIAGNOSIS — R634 Abnormal weight loss: Secondary | ICD-10-CM | POA: Diagnosis not present

## 2018-05-10 DIAGNOSIS — Z87891 Personal history of nicotine dependence: Secondary | ICD-10-CM | POA: Insufficient documentation

## 2018-05-10 DIAGNOSIS — K219 Gastro-esophageal reflux disease without esophagitis: Secondary | ICD-10-CM

## 2018-05-10 DIAGNOSIS — I10 Essential (primary) hypertension: Secondary | ICD-10-CM | POA: Insufficient documentation

## 2018-05-10 DIAGNOSIS — Z79899 Other long term (current) drug therapy: Secondary | ICD-10-CM | POA: Diagnosis not present

## 2018-05-10 DIAGNOSIS — D649 Anemia, unspecified: Secondary | ICD-10-CM | POA: Diagnosis not present

## 2018-05-10 DIAGNOSIS — Z5111 Encounter for antineoplastic chemotherapy: Secondary | ICD-10-CM | POA: Diagnosis not present

## 2018-05-10 DIAGNOSIS — Z7982 Long term (current) use of aspirin: Secondary | ICD-10-CM | POA: Diagnosis not present

## 2018-05-10 DIAGNOSIS — M1990 Unspecified osteoarthritis, unspecified site: Secondary | ICD-10-CM | POA: Diagnosis not present

## 2018-05-10 DIAGNOSIS — R195 Other fecal abnormalities: Secondary | ICD-10-CM

## 2018-05-10 LAB — CBC WITH DIFFERENTIAL/PLATELET
Abs Immature Granulocytes: 0.05 10*3/uL (ref 0.00–0.07)
Basophils Absolute: 0 10*3/uL (ref 0.0–0.1)
Basophils Relative: 0 %
Eosinophils Absolute: 0.1 10*3/uL (ref 0.0–0.5)
Eosinophils Relative: 1 %
HCT: 27.5 % — ABNORMAL LOW (ref 39.0–52.0)
Hemoglobin: 9.3 g/dL — ABNORMAL LOW (ref 13.0–17.0)
Immature Granulocytes: 1 %
Lymphocytes Relative: 9 %
Lymphs Abs: 0.6 10*3/uL — ABNORMAL LOW (ref 0.7–4.0)
MCH: 28.2 pg (ref 26.0–34.0)
MCHC: 33.8 g/dL (ref 30.0–36.0)
MCV: 83.3 fL (ref 80.0–100.0)
MONO ABS: 0.6 10*3/uL (ref 0.1–1.0)
Monocytes Relative: 10 %
Neutro Abs: 5.3 10*3/uL (ref 1.7–7.7)
Neutrophils Relative %: 79 %
Platelets: 148 10*3/uL — ABNORMAL LOW (ref 150–400)
RBC: 3.3 MIL/uL — ABNORMAL LOW (ref 4.22–5.81)
RDW: 14 % (ref 11.5–15.5)
WBC: 6.6 10*3/uL (ref 4.0–10.5)
nRBC: 0 % (ref 0.0–0.2)

## 2018-05-10 LAB — COMPREHENSIVE METABOLIC PANEL
ALT: 17 U/L (ref 0–44)
AST: 34 U/L (ref 15–41)
Albumin: 3.6 g/dL (ref 3.5–5.0)
Alkaline Phosphatase: 38 U/L (ref 38–126)
Anion gap: 7 (ref 5–15)
BUN: 11 mg/dL (ref 8–23)
CO2: 23 mmol/L (ref 22–32)
Calcium: 9.6 mg/dL (ref 8.9–10.3)
Chloride: 105 mmol/L (ref 98–111)
Creatinine, Ser: 0.98 mg/dL (ref 0.61–1.24)
GFR calc non Af Amer: >60 mL/min (ref >60)
Glucose, Bld: 155 mg/dL — ABNORMAL HIGH (ref 70–99)
Potassium: 3.9 mmol/L (ref 3.5–5.1)
Sodium: 135 mmol/L (ref 135–145)
Total Bilirubin: 0.8 mg/dL (ref 0.3–1.2)
Total Protein: 7.2 g/dL (ref 6.5–8.1)

## 2018-05-10 LAB — PSA: PROSTATIC SPECIFIC ANTIGEN: 488 ng/mL — AB (ref 0.00–4.00)

## 2018-05-10 LAB — LACTATE DEHYDROGENASE: LDH: 207 U/L — ABNORMAL HIGH (ref 98–192)

## 2018-05-10 NOTE — Progress Notes (Signed)
Hematology/Oncology  Follow up note [facility] Telephone:([phone]) [phone] [phone]  Patient Care Team: [place] Pink, MD as PCP - General (Family Medicine) Earlie Server, MD as Medical Oncologist (Medical Oncology)  REASON FOR VISIT Follow up for antineoplasm treatment of Prostate Cancer  HISTORY OF PRESENTING ILLNESS:  Matthew Hunter 84 y.o.  male with PMH listed as below who was referred by [facility] Urology Provider [name] to me for evaluation of clinical prostate cancer and management.  He has a history of elevated PSA and BPH. PSA trends as follows: 11/14/2014 4.3, 11/07/2015 7.1, 05/11/2016 10.4, 06/11/2016 10.6, 07/07/2016 7.8, 01/06/2017 86.8.  He was seen by urology and was diagnosed with clinical prostate cancer given the extreme elevation of PSA and prostate nodule. Biopsy was not pursued. He was treated with [name] on 01/29/2017,  # prostate biopsy on 03/10/2017. 5 out of 6 cores positive for prostate cancer, with highest gleason score group 9 (4+5), + perineural invasion.  Stage IV prostate cancer, multiple osseus metastatic disease. No visceral involvement.   01/18/2018  CT scan image was independently reviewed by me and discussed with patient.  Patient has progression of bone metastatic disease only  Current treatment  Patient got first Penton on 01/29/2018, switched to leupron on 04/13/2015,  Q6 months.  .Testosterone level at 14, castration level.  Upfront Gillermina Phy was added on December 2019 to assist fast disease control (phase II trial [place] B, Lancet Oncol. 2014) S/p palliative RT to lumber spine and SI joints. Xtandi discontinued on due to CNS toxicity in April 2019.  12/09/17 Abiaterone discontinued.  7/ 18/2019 Start Apalutimide '240mg'$  daily.   Trend of tumor marker  PSA 86.8 -->64.78 -->131--> 242 added Xtanti --> 17.74-->1.82 Xtandi stopped due to CNS toxicity in April 2019. --> 1.79- (added Abiaterone)-> 11.06-->24.53--> 39.48  [Abiaterone stopped, Added Apalutimid]--> 65.71-->88.63--> Xofigo treatment--> 215--> Back on Xtandi reduced dose.   12/09/17 Abiaterone discontinued due to rising PSA and disease progression. 12/16/2017 Start Apalutimide '240mg'$  daily. Held on 01/13/2018 due to rising PSA, also causing patient to feel dizziness/black out.  Started Xofigo treatment on 02/23/2018. 02/28/2018, back on Xtandi reduced dose '120mg'$ .    INTERVAL HISTORY Patient presents to follow up for management of metastatic prostate cancer and follow-up on his metastatic prostate cancer related symptoms and management of prostate cancer.   # Patient reports not feeling well, he has finished 3 cycles of Xofigo treatments.  Patient continue to have generalized bone pain, including left lower back, left arm. He uses Fentanyl patch 37.35mg Q72 hours and oxycodone '5mg'$  Q6 hours as needed.  He can not specify average oxycodone tablets he uses every day.   # Appetite is poor.not eating well. On Megace '80mg'$  BID. Don't feel it is helping his symptoms.  # Takes Xtandi '120mg'$  daily, denies any CNS symptoms.  # continue have weight loss, lost one pound since last visit 4 weeks ago.   # Loose stool, he reports having loose stool x 3 last night and 2 times this morning.  He takes Senokot daily for constipation.     ROS:  Review of Systems  Constitutional: Positive for appetite change, fatigue and unexpected weight change. Negative for chills and fever.  HENT:   Negative for hearing loss and voice change.   Eyes: Negative for eye problems and icterus.  Respiratory: Negative for chest tightness, cough and shortness of breath.   Cardiovascular: Negative for chest pain and leg swelling.  Gastrointestinal: Negative for abdominal distention and abdominal pain.  Endocrine: Negative for hot flashes.  Genitourinary: Negative for difficulty urinating, dysuria and frequency.   Musculoskeletal: Positive for arthralgias and back pain.  Skin: Negative for  itching and rash.  Neurological: Negative for light-headedness and numbness.  Hematological: Negative for adenopathy. Does not bruise/bleed easily.  Psychiatric/Behavioral: Negative for confusion.    MEDICAL HISTORY:  Past Medical History:  Diagnosis Date  . Arthritis   . Benign enlargement of prostate   . CAD (coronary artery disease)   . Constipation   . Elevated PSA   . GERD (gastroesophageal reflux disease)   . Hypertension   . Incomplete bladder emptying   . Nocturia   . Prostate cancer ([place]) 05/03/2017   Rad tx's and Lupron shots.   . Urgency of micturation   . Urinary frequency     SURGICAL HISTORY: Past Surgical History:  Procedure Laterality Date  . GALLBLADDER SURGERY    . PROSTATE SURGERY    . STOMACH SURGERY      SOCIAL HISTORY: Social History   Socioeconomic History  . Marital status: Married    Spouse name: Not on file  . Number of children: Not on file  . Years of education: Not on file  . Highest education level: Not on file  Occupational History  . Not on file  Social Needs  . Financial resource strain: Not on file  . Food insecurity:    Worry: Not on file    Inability: Not on file  . Transportation needs:    Medical: Not on file    Non-medical: Not on file  Tobacco Use  . Smoking status: Former Smoker    Last attempt to quit: 06/01/1998    Years since quitting: 19.9  . Smokeless tobacco: Never Used  Substance and Sexual Activity  . Alcohol use: No    Alcohol/week: 0.0 standard drinks  . Drug use: Not Currently    Frequency: 2.0 times per week    Types: Marijuana    Comment: marijuana two time monthly gives him an appetite  . Sexual activity: Not on file  Lifestyle  . Physical activity:    Days per week: Not on file    Minutes per session: Not on file  . Stress: Not on file  Relationships  . Social connections:    Talks on phone: Not on file    Gets together: Not on file    Attends religious service: Not on file    Active member  of club or organization: Not on file    Attends meetings of clubs or organizations: Not on file    Relationship status: Not on file  . Intimate partner violence:    Fear of current or ex partner: Not on file    Emotionally abused: Not on file    Physically abused: Not on file    Forced sexual activity: Not on file  Other Topics Concern  . Not on file  Social History Narrative  . Not on file    FAMILY HISTORY: Family History  Problem Relation Age of Onset  . Hypertension Mother   . Stroke Father   . Kidney disease Neg Hx   . Prostate cancer Neg Hx   . Kidney cancer Neg Hx   . Bladder Cancer Neg Hx     ALLERGIES:  has No Known Allergies.  MEDICATIONS:  Current Outpatient Medications  Medication Sig Dispense Refill  . aspirin EC 81 MG tablet Take by mouth.    Marland Kitchen atenolol (TENORMIN) 100 MG tablet  Take 100 mg by mouth daily.     . Calcium Carb-Cholecalciferol (CALCIUM-VITAMIN D) 500-200 MG-UNIT tablet Take 1 tablet by mouth daily. 90 tablet 0  . dutasteride (AVODART) 0.5 MG capsule Take 1 capsule (0.5 mg total) by mouth daily. 30 capsule 0  . enzalutamide (XTANDI) 40 MG capsule Take 3 capsules (120 mg total) by mouth daily. 90 capsule 1  . fentaNYL (DURAGESIC - DOSED MCG/HR) 25 MCG/HR patch Place 1 patch (25 mcg total) onto the skin every 3 (three) days. 10 patch 0  . finasteride (PROSCAR) 5 MG tablet Take 1 tablet (5 mg total) by mouth daily. 90 tablet 3  . megestrol (MEGACE) 40 MG tablet Take 2 tablets (80 mg total) by mouth 2 (two) times daily. 120 tablet 0  . mirabegron ER (MYRBETRIQ) 25 MG TB24 tablet Take 1 tablet (25 mg total) by mouth daily. 90 tablet 3  . mirtazapine (REMERON) 15 MG tablet Take 15 mg by mouth at bedtime.  11  . nortriptyline (PAMELOR) 25 MG capsule Take 25 mg by mouth at bedtime. Reported on 05/16/2015  1  . omeprazole (PRILOSEC) 20 MG capsule Take 20 mg by mouth daily.    Marland Kitchen oxyCODONE (ROXICODONE) 5 MG immediate release tablet Take 1 tablet (5 mg total)  by mouth every 6 (six) hours as needed for severe pain. 60 tablet 0  . polyethylene glycol (MIRALAX / GLYCOLAX) packet Take 17 g by mouth daily. Mix one tablespoon with 8oz of your favorite juice or water every day until you are having soft formed stools. Then start taking once daily if you didn't have a stool the day before. 30 each 0  . pyridOXINE (VITAMIN B-6) 50 MG tablet Take 50 mg by mouth daily.     Marland Kitchen senna-docusate (SENOKOT-S) 8.6-50 MG tablet Take 2 tablets by mouth daily. 60 tablet 3  . tamsulosin (FLOMAX) 0.4 MG CAPS capsule Take 1 capsule (0.4 mg total) by mouth daily. 90 capsule 3  . Telmisartan-Amlodipine 80-5 MG TABS Take 1 tablet by mouth daily.     . hydrocortisone 2.5 % cream APP EXT AA BID  0   No current facility-administered medications for this visit.       Marland Kitchen  PHYSICAL EXAMINATION: ECOG PERFORMANCE STATUS: 1 - Symptomatic but completely ambulatory Vitals:   05/10/18 0953  BP: 115/68  Pulse: 72  Resp: 18  Temp: (!) 96.3 F (35.7 C)   Filed Weights   05/10/18 0953  Weight: 174 lb 1.6 oz (79 kg)   Physical Exam  Constitutional: He is oriented to person, place, and time. No distress.  HENT:  Head: Normocephalic and atraumatic.  Mouth/Throat: Oropharynx is clear and moist. No oropharyngeal exudate.  Eyes: Pupils are equal, round, and reactive to light. Conjunctivae and EOM are normal. No scleral icterus.  Neck: Normal range of motion. Neck supple. No thyromegaly present.  Cardiovascular: Normal rate, regular rhythm and normal heart sounds.  No murmur heard. Pulmonary/Chest: Effort normal and breath sounds normal. No respiratory distress. He has no wheezes.  Abdominal: Soft. Bowel sounds are normal. He exhibits no distension and no mass. There is no tenderness.  Musculoskeletal: Normal range of motion. He exhibits no edema or deformity.  Neurological: He is alert and oriented to person, place, and time. No cranial nerve deficit. Coordination normal.  Skin:  Skin is warm and dry. No rash noted. He is not diaphoretic. No erythema.  Psychiatric: He has a normal mood and affect. His behavior is normal. Thought content normal.  LABORATORY DATA:  I have reviewed the data as listed Lab Results  Component Value Date   WBC 6.6 05/10/2018   HGB 9.3 (L) 05/10/2018   HCT 27.5 (L) 05/10/2018   MCV 83.3 05/10/2018   PLT 148 (L) 05/10/2018   Recent Labs    02/28/18 1342 03/14/18 1017 03/25/18 1202 03/28/18 1335 05/10/18 0934  NA 135 136 135 134* 135  K 3.9 4.2 4.2 4.1 3.9  CL 106 106 104 108 105  CO2 22 22 21* 20* 23  GLUCOSE 109* 171* 188* 98 155*  BUN '14 11 13 19 11  '$ CREATININE 1.16 1.11 1.14 1.16 0.98  CALCIUM 9.4 9.9 9.3 9.6 9.6  GFRNONAA 55* 58* 56* 55* >60  GFRAA >60 >60 >60 >60 >60  PROT 7.9 8.2*  --   --  7.2  ALBUMIN 3.9 4.2  --   --  3.6  AST 27 27  --   --  34  ALT 16 15  --   --  17  ALKPHOS 40 44  --   --  38  BILITOT 1.5* 0.8  --   --  0.8   RADIOGRAPHIC STUDIES: I have personally reviewed the radiological images as listed and agreed with the findings in the report. 12/15/2017 Bone scan whole body showed new sites of osseous tracer accumulation are identified especially to size at the distal left femoral diaphysis, L2 vertebral body, right iliac bone, right ischium. 01/18/2018 chest abdomen pelvis with contrast Extensive metastasis disease to bone, with all bone lesions new from the abdomen and pelvis CT dated 9/5 2018 and a chest CT dated 06/19/2013.  Involvement is similar to the recent bone scan.  No other evidence of metastatic disease.  No acute abnormalities in the chest abdomen and pelvis.  03/14/2018 CT chest Angio PE protocol 1. No acute cardiopulmonary abnormalities. No evidence for acute pulmonary embolus 2. Coronary artery atherosclerotic calcifications 3.  Aortic Atherosclerosis (ICD10-I70.0). 4. Sclerotic bone metastasis as noted previously  03/25/2018 CT abdomen pelvis with contrast  1. No acute  abnormality in the abdomen or pelvis. 2. Extensive metastatic bone disease. Findings are similar to theexam on 01/18/2018.  3. Nonobstructive left kidney stone. 4. Stable left adrenal nodule.  ASSESSMENT & PLAN: 84 yo male follows up for management of metastatic prostate cancer and neoplasm related symptoms..  1. Prostate cancer ([place])   2. Neoplasm related pain   3. Prostate cancer metastatic to bone ([place])   4. Weight loss   5. Anemia, unspecified type   6. Neoplastic malignant related fatigue   7. 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  S/p 3 out of 6 planned [facility] treatment. ALP remains normal. LDH mildly elevated.  NGS came back, patient has somatic mutation of BRCA1, PARP inhibitor can be considered as next line of treatment.  PARP inhibitors have not been well studied to be combined with Xofigo treatments. His PSA has progressively increased.  I will recommend weekly docetaxel.  Discontinue Xtandi. Patient previously was hesitant in starting chemotherapy.  We discussed about chemotherapy option again and he is in agreement. We will arrange patient to go to chemo education.  I explained to the patient the risks and benefits of chemotherapy including all but not limited to hair loss, mouth sore, nausea, vomiting, low blood counts, bleeding, and risk of life threatening infection and even death, neuropathy secondary malignancy etc.   Patient voices understanding and willing  to proceed chemotherapy.   # Chemotherapy education; Hopefully the planned start chemotherapy next week. Antiemetics-Zofran and Compazine; EMLA cream sent to pharmacy. Will try to use his peripheral vein. If no or limited access, will proceed with port placement.    # Discussed about genetic testing. He agrees. Will refer to genetic conseling.   # Bone pain due to metastatic bone disease, advise patient to increase Fentanyl patch to 46mg Q72  hours, use oxycodone '5mg'$  Q6 hours as needed. Discussed with Radonc Dr.Chrystal. Recommend to obtain bone scan and   # Loose stools, advise patient to hold using laxitives/stool softner. He should resume stool softener if bowel movement is formed.  # Weight loss, lost 1 pound since 4 weeks ago. Continue Megace '80mg'$  BID.  # proceed with XDelton See Orders Placed This Encounter  Procedures  . NM Bone Scan Whole Body    Standing Status:   Future    Standing Expiration Date:   05/11/2019    Order Specific Question:   If indicated for the ordered procedure, I authorize the administration of a radiopharmaceutical per Radiology protocol    Answer:   Yes    Order Specific Question:   Preferred imaging location?    Answer:   [facility]    Order Specific Question:   Radiology Contrast Protocol - do NOT remove file path    Answer:   [email or web address]  . Ambulatory referral to Genetics    Referral Priority:   Routine    Referral Type:   Consultation    Referral Reason:   Specialty Services Required    Number of Visits Requested:   1  We spent sufficient time to discuss many aspect of care, questions were answered to patient's satisfaction.  Return of visit: to be determined.   ZEarlie Server MD, PhD Hematology Oncology CMckee [facility] Centerat ASouth Lincoln Medical CenterPager- [phone]/03/2018

## 2018-05-10 NOTE — Progress Notes (Signed)
Patient here for follow up

## 2018-05-12 ENCOUNTER — Inpatient Hospital Stay: Payer: Medicare Other

## 2018-05-12 ENCOUNTER — Other Ambulatory Visit: Payer: Self-pay

## 2018-05-12 DIAGNOSIS — C61 Malignant neoplasm of prostate: Secondary | ICD-10-CM | POA: Diagnosis not present

## 2018-05-12 LAB — CBC WITH DIFFERENTIAL/PLATELET
Abs Immature Granulocytes: 0.03 10*3/uL (ref 0.00–0.07)
Basophils Absolute: 0 10*3/uL (ref 0.0–0.1)
Basophils Relative: 0 %
Eosinophils Absolute: 0 10*3/uL (ref 0.0–0.5)
Eosinophils Relative: 1 %
HEMATOCRIT: 29.1 % — AB (ref 39.0–52.0)
Hemoglobin: 9.8 g/dL — ABNORMAL LOW (ref 13.0–17.0)
Immature Granulocytes: 1 %
Lymphocytes Relative: 13 %
Lymphs Abs: 0.8 10*3/uL (ref 0.7–4.0)
MCH: 27.8 pg (ref 26.0–34.0)
MCHC: 33.7 g/dL (ref 30.0–36.0)
MCV: 82.7 fL (ref 80.0–100.0)
Monocytes Absolute: 0.8 10*3/uL (ref 0.1–1.0)
Monocytes Relative: 12 %
NEUTROS ABS: 4.7 10*3/uL (ref 1.7–7.7)
Neutrophils Relative %: 73 %
Platelets: 172 10*3/uL (ref 150–400)
RBC: 3.52 MIL/uL — ABNORMAL LOW (ref 4.22–5.81)
RDW: 14.1 % (ref 11.5–15.5)
WBC: 6.3 10*3/uL (ref 4.0–10.5)
nRBC: 0 % (ref 0.0–0.2)

## 2018-05-12 LAB — COMPREHENSIVE METABOLIC PANEL
ALT: 19 U/L (ref 0–44)
AST: 31 U/L (ref 15–41)
Albumin: 3.8 g/dL (ref 3.5–5.0)
Alkaline Phosphatase: 39 U/L (ref 38–126)
Anion gap: 7 (ref 5–15)
BUN: 13 mg/dL (ref 8–23)
CO2: 23 mmol/L (ref 22–32)
Calcium: 9.7 mg/dL (ref 8.9–10.3)
Chloride: 104 mmol/L (ref 98–111)
Creatinine, Ser: 0.99 mg/dL (ref 0.61–1.24)
GFR calc Af Amer: >60 mL/min (ref >60)
GFR calc non Af Amer: >60 mL/min (ref >60)
Glucose, Bld: 119 mg/dL — ABNORMAL HIGH (ref 70–99)
Potassium: 4.2 mmol/L (ref 3.5–5.1)
SODIUM: 134 mmol/L — AB (ref 135–145)
TOTAL PROTEIN: 7.5 g/dL (ref 6.5–8.1)
Total Bilirubin: 0.7 mg/dL (ref 0.3–1.2)

## 2018-05-12 MED ORDER — PROCHLORPERAZINE MALEATE 10 MG PO TABS: 10.0000 mg | ORAL_TABLET | Freq: Four times a day (QID) | ORAL | 1 refills | Status: AC | PRN

## 2018-05-12 MED ORDER — ONDANSETRON HCL 8 MG PO TABS: 8.0000 mg | ORAL_TABLET | Freq: Two times a day (BID) | ORAL | 1 refills | Status: AC | PRN

## 2018-05-12 NOTE — Progress Notes (Signed)
Nutrition Follow-up:  Patient with prostate cancer stage IV with bone mets.  Patient followed by Dr. Tasia Catchings.    Met with patient and wife prior to labs today.  Patient reports still no appetite. Confirms that he has been taking medication.  Reports that yesterday he ate oatmeal because it goes down better.  Continues to report that foods get stuck in mid chest area.  Foods that most often go down well are applesauce, pears, oatmeal, vegetable soup, beanie weanies chopped ham and bologna, sometimes can eat a sandwich.  Can't remember if he ate lunch or not yesterday.  Supper was hamburger with chili.  Tries to drink 2 ensure enlive per day.    Reports had diarrhea for about 2 weeks but that has improved per patient.    Medications: reviewed megace and remeron increased,   Labs: reviewed  Anthropometrics:   Weight today measured in clinic 174 lb 2 oz decreased by 1 pound from weight on 11/14.     NUTRITION DIAGNOSIS: Unintentional weight loss   INTERVENTION:  Reviewed with patient again regarding adding liquids, moisture to foods (ie butters, sauces, gravies, dressings) to ease foods going down and provide additional calories to prevent weight from going down.   Provided written education materials to patient and wife on ways to increase calories and protein.  Also provided high calorie, high protein recipes to patient with many foods being smooth and creamy (smoothies, puddings, custard, etc).   Recommend adding daily MVI due to poor intake and weight loss.  Offered more ensure and declined at this time.  Coupons provided Encouraged patient to continue to discuss issue of foods getting stuck with PCP and Dr. Tasia Catchings.  (noted nissen fundaplication in the past) Encouraged small frequent snacks/meals during the day.   Have met with patient multiple times regarding strategies to increase calories and protein with education materials provided. No further recommendations at this time.  Encouraged patient  to reach back out to RD with questions or concerns.     MONITORING, EVALUATION, GOAL: Patient will consume adequate calories and protein to maintain weight.    NEXT VISIT: as needed, patient to contact RD for future appointment as needed.   Jakobie Henslee B. Zenia Resides, [place], [place] Registered Dietitian [phone] (pager)

## 2018-05-12 NOTE — Progress Notes (Signed)
ON PATHWAY REGIMEN - Prostate  No Change  Continue With Treatment as Ordered.   Leuprolide 22.5 mg q3 months:   A cycle is every 3 months:     Leuprolide acetate depot   **Always confirm dose/schedule in your pharmacy ordering system**  Abiraterone 1000 mg Daily + Prednisone 5 mg Daily:   Daily:     Abiraterone acetate      Prednisone   **Always confirm dose/schedule in your pharmacy ordering system**  Patient Characteristics: Adenocarcinoma, Metastatic, Hormone Naive, High Volume Disease* Current radiographic evidence of distant metastasis<= Yes Histology: Adenocarcinoma AJCC T Category: cTX Gleason Primary: 4 AJCC N Category: NX Gleason Secondary: 5 AJCC M Category: M1b Gleason Score: 9 AJCC 8 Stage Grouping: IVB PSA Values (ng/mL): ? 20  Intent of Therapy: Non-Curative / Palliative Intent, Discussed with Patient

## 2018-05-12 NOTE — Progress Notes (Signed)
DISCONTINUE ON PATHWAY REGIMEN - Prostate   Leuprolide 22.5 mg q3 months:   A cycle is every 3 months:     Leuprolide acetate depot   **Always confirm dose/schedule in your pharmacy ordering system**  Abiraterone 1000 mg Daily + Prednisone 5 mg Daily:   Daily:     Abiraterone acetate      Prednisone   **Always confirm dose/schedule in your pharmacy ordering system**  REASON: Disease Progression PRIOR TREATMENT: POS86: Abiraterone 1,000 mg Daily + Prednisone 5 mg Daily + LHRH Agonist TREATMENT RESPONSE: Progressive Disease (PD)  START ON PATHWAY REGIMEN - Prostate     A cycle is every 21 days:     Docetaxel      Prednisone   **Always confirm dose/schedule in your pharmacy ordering system**  Patient Characteristics: Adenocarcinoma, Distant Metastases, Castration Resistant, Symptomatic, Docetaxel Eligible Histology: Adenocarcinoma Therapeutic Status: Distant Metastases  Intent of Therapy: Non-Curative / Palliative Intent, Discussed with Patient

## 2018-05-16 ENCOUNTER — Inpatient Hospital Stay: Payer: Medicare Other

## 2018-05-16 NOTE — Patient Instructions (Signed)
Docetaxel injection What is this medicine? DOCETAXEL (doe se TAX el) is a chemotherapy drug. It targets fast dividing cells, like cancer cells, and causes these cells to die. This medicine is used to treat many types of cancers like breast cancer, certain stomach cancers, head and neck cancer, lung cancer, and prostate cancer. This medicine may be used for other purposes; ask your health care provider or pharmacist if you have questions. COMMON BRAND NAME(S): Docefrez, Taxotere What should I tell my health care provider before I take this medicine? They need to know if you have any of these conditions: -infection (especially a virus infection such as chickenpox, cold sores, or herpes) -liver disease -low blood counts, like low white cell, platelet, or red cell counts -an unusual or allergic reaction to docetaxel, polysorbate 80, other chemotherapy agents, other medicines, foods, dyes, or preservatives -pregnant or trying to get pregnant -breast-feeding How should I use this medicine? This drug is given as an infusion into a vein. It is administered in a hospital or clinic by a specially trained health care professional. Talk to your pediatrician regarding the use of this medicine in children. Special care may be needed. Overdosage: If you think you have taken too much of this medicine contact a poison control center or emergency room at once. NOTE: This medicine is only for you. Do not share this medicine with others. What if I miss a dose? It is important not to miss your dose. Call your doctor or health care professional if you are unable to keep an appointment. What may interact with this medicine? -cyclosporine -erythromycin -ketoconazole -medicines to increase blood counts like filgrastim, pegfilgrastim, sargramostim -vaccines Talk to your doctor or health care professional before taking any of these medicines: -acetaminophen -aspirin -ibuprofen -ketoprofen -naproxen This list  may not describe all possible interactions. Give your health care provider a list of all the medicines, herbs, non-prescription drugs, or dietary supplements you use. Also tell them if you smoke, drink alcohol, or use illegal drugs. Some items may interact with your medicine. What should I watch for while using this medicine? Your condition will be monitored carefully while you are receiving this medicine. You will need important blood work done while you are taking this medicine. This drug may make you feel generally unwell. This is not uncommon, as chemotherapy can affect healthy cells as well as cancer cells. Report any side effects. Continue your course of treatment even though you feel ill unless your doctor tells you to stop. In some cases, you may be given additional medicines to help with side effects. Follow all directions for their use. Call your doctor or health care professional for advice if you get a fever, chills or sore throat, or other symptoms of a cold or flu. Do not treat yourself. This drug decreases your body's ability to fight infections. Try to avoid being around people who are sick. This medicine may increase your risk to bruise or bleed. Call your doctor or health care professional if you notice any unusual bleeding. This medicine may contain alcohol in the product. You may get drowsy or dizzy. Do not drive, use machinery, or do anything that needs mental alertness until you know how this medicine affects you. Do not stand or sit up quickly, especially if you are an older patient. This reduces the risk of dizzy or fainting spells. Avoid alcoholic drinks. Do not become pregnant while taking this medicine. Women should inform their doctor if they wish to become pregnant or   think they might be pregnant. There is a potential for serious side effects to an unborn child. Talk to your health care professional or pharmacist for more information. Do not breast-feed an infant while taking  this medicine. What side effects may I notice from receiving this medicine? Side effects that you should report to your doctor or health care professional as soon as possible: -allergic reactions like skin rash, itching or hives, swelling of the face, lips, or tongue -low blood counts - This drug may decrease the number of white blood cells, red blood cells and platelets. You may be at increased risk for infections and bleeding. -signs of infection - fever or chills, cough, sore throat, pain or difficulty passing urine -signs of decreased platelets or bleeding - bruising, pinpoint red spots on the skin, black, tarry stools, nosebleeds -signs of decreased red blood cells - unusually weak or tired, fainting spells, lightheadedness -breathing problems -fast or irregular heartbeat -low blood pressure -mouth sores -nausea and vomiting -pain, swelling, redness or irritation at the injection site -pain, tingling, numbness in the hands or feet -swelling of the ankle, feet, hands -weight gain Side effects that usually do not require medical attention (report to your doctor or health care professional if they continue or are bothersome): -bone pain -complete hair loss including hair on your head, underarms, pubic hair, eyebrows, and eyelashes -diarrhea -excessive tearing -changes in the color of fingernails -loosening of the fingernails -nausea -muscle pain -red flush to skin -sweating -weak or tired This list may not describe all possible side effects. Call your doctor for medical advice about side effects. You may report side effects to FDA at 1-800-FDA-1088. Where should I keep my medicine? This drug is given in a hospital or clinic and will not be stored at home. NOTE: This sheet is a summary. It may not cover all possible information. If you have questions about this medicine, talk to your doctor, pharmacist, or health care provider.  2018 Elsevier/Gold Standard (2015-06-20 12:32:56)  

## 2018-05-18 ENCOUNTER — Encounter: Payer: Self-pay | Admitting: Urology

## 2018-05-18 ENCOUNTER — Ambulatory Visit: Admission: RE | Admit: 2018-05-18 | Payer: Medicare Other | Source: Ambulatory Visit | Admitting: Radiation Oncology

## 2018-05-18 ENCOUNTER — Encounter
Admission: RE | Admit: 2018-05-18 | Discharge: 2018-05-18 | Disposition: A | Discharge status: Home / Self Care | Payer: Medicare Other | Source: Ambulatory Visit | Attending: Radiation Oncology | Admitting: Radiation Oncology

## 2018-05-18 ENCOUNTER — Ambulatory Visit (INDEPENDENT_AMBULATORY_CARE_PROVIDER_SITE_OTHER): Payer: Medicare Other | Admitting: Urology

## 2018-05-18 VITALS — BP 106/66 | HR 78 | Ht 69.0 in | Wt 174.3 lb

## 2018-05-18 DIAGNOSIS — Z192 Hormone resistant malignancy status: Secondary | ICD-10-CM | POA: Diagnosis not present

## 2018-05-18 DIAGNOSIS — C7951 Secondary malignant neoplasm of bone: Secondary | ICD-10-CM | POA: Insufficient documentation

## 2018-05-18 DIAGNOSIS — R399 Unspecified symptoms and signs involving the genitourinary system: Secondary | ICD-10-CM

## 2018-05-18 DIAGNOSIS — C61 Malignant neoplasm of prostate: Secondary | ICD-10-CM

## 2018-05-18 MED ORDER — RADIUM RA 223 DICHLORIDE 30 MCCI/ML IV SOLN
123.1270 | Freq: Once | INTRAVENOUS | Status: AC
  Administered 2018-05-18: 123.127 via INTRAVENOUS

## 2018-05-18 NOTE — Progress Notes (Signed)
12:21 PM   Matthew Hunter 09/08/1929 [id number]  Referring provider: Maryland Pink, MD [address] [facility] [place], [place] [address]  Chief Complaint  Patient presents with  . Follow-up  . hormone resistant prostate cancer    HPI: Patient is an 84 year old African-American male with metastatic prostate cancer who presents today for a follow up. He reports of feeling well overall. He presented today for a Lupron injection but due to having one in 03/28/2018, the injection has been rescheduled.   Castrate resistant prostate cancer Patient's PSA is currently 86.8 ng/mL on 01/06/2017.    Contrast CT in 01/2017 noted no evidence of metastatic disease.  Left adrenal nodule is indeterminate by relative washout characteristics but appears grossly stable in size from 02/28/2007, favoring a benign adenoma.  Left renal stone.  Aortic atherosclerosis.  Bone scan noted multiple abnormal foci of with of increased osseous tracer accumulation compatible with widespread osseous metastatic disease.  sy for pathological confirmation for prostate cancer.  He underwent a biopsy on 03/10/2017 with Dr. Junious Silk.  Pathology was positive for six-core Biopsy 03/10/2017 revealed a 35 gram prostate with Gleason 4+4=8, 2 cores, left base, left apex and Gleason 4+5=9, two cores, left mid and right mid. 4/6 cores positive.   Bone scan in 11/2017 revealed widespread osseous metastatic disease progressive since previous exam.  Chest, abdomen and pelvis CT in 12/2017 revealed extensive metastatic disease to bone, with all bone lesions new from the abdomen and pelvis CT dated 02/03/2017 and the chest CT dated 06/19/2013. Involvement is similar to the recent bone scan.  No other evidence of metastatic disease.  No acute abnormalities in the chest, abdomen or pelvis.  CT scan in 03/2018 revealed no acute abnormality in the abdomen or pelvis.  Extensive metastatic bone disease. Findings are similar to the exam on  01/18/2018.  Nonobstructive left kidney stone.  Stable left adrenal nodule.  His most recent PSA was 488.00 ng/mL in 05/10/2018.  Currently on Xofigo, Zytiga and Lupron injections.  Will be receiving chemotherapy soon.  He is taking his calcium and Vitamin D.    BPH WITH LUTS  (prostate and/or bladder) On 11/10/2017, IPSS score was 15/1, and the previous score was 8/2. Previous PVR was 15 mL His major complaints were nocturia and intermittency x many years. He denied any dysuria, hematuria, or suprapubic pain.   Today he did not report of any problematic urinary symtpoms. He is currently taking tamsulosin 0.4 mg daily and Myrbetriq 25 mg daily. He will discontinue the use of finasteride and dutasteride.   Denies any recent fevers, chills, nausea or vomiting.      PMH: Past Medical History:  Diagnosis Date  . Arthritis   . Benign enlargement of prostate   . CAD (coronary artery disease)   . Constipation   . Elevated PSA   . GERD (gastroesophageal reflux disease)   . Hypertension   . Incomplete bladder emptying   . Nocturia   . Prostate cancer ([place]) 05/03/2017   Rad tx's and Lupron shots.   . Urgency of micturation   . Urinary frequency     Surgical History: Past Surgical History:  Procedure Laterality Date  . GALLBLADDER SURGERY    . PROSTATE SURGERY    . STOMACH SURGERY      Home Medications:  Allergies as of 05/18/2018   No Known Allergies     Medication List       Accurate as of May 18, 2018 12:21 PM.  Always use your most recent med list.        aspirin EC 81 MG tablet Take by mouth.   atenolol 100 MG tablet Commonly known as:  TENORMIN Take 100 mg by mouth daily.   calcium-vitamin D 500-200 MG-UNIT tablet Take 1 tablet by mouth daily.   dutasteride 0.5 MG capsule Commonly known as:  AVODART Take 1 capsule (0.5 mg total) by mouth daily.   enzalutamide 40 MG capsule Commonly known as:  XTANDI Take 3 capsules (120 mg total) by mouth daily.     fentaNYL 25 MCG/HR patch Commonly known as:  DURAGESIC - dosed mcg/hr Place 1 patch (25 mcg total) onto the skin every 3 (three) days.   finasteride 5 MG tablet Commonly known as:  PROSCAR Take 1 tablet (5 mg total) by mouth daily.   hydrocortisone 2.5 % cream APP EXT AA BID   megestrol 40 MG tablet Commonly known as:  MEGACE Take 2 tablets (80 mg total) by mouth 2 (two) times daily.   mirabegron ER 25 MG Tb24 tablet Commonly known as:  MYRBETRIQ Take 1 tablet (25 mg total) by mouth daily.   mirtazapine 15 MG tablet Commonly known as:  REMERON Take 15 mg by mouth at bedtime.   nortriptyline 25 MG capsule Commonly known as:  PAMELOR Take 25 mg by mouth at bedtime. Reported on 05/16/2015   omeprazole 20 MG capsule Commonly known as:  PRILOSEC Take 20 mg by mouth daily.   ondansetron 8 MG tablet Commonly known as:  ZOFRAN Take 1 tablet (8 mg total) by mouth 2 (two) times daily as needed for refractory nausea / vomiting.   oxyCODONE 5 MG immediate release tablet Commonly known as:  ROXICODONE Take 1 tablet (5 mg total) by mouth every 6 (six) hours as needed for severe pain.   polyethylene glycol packet Commonly known as:  MIRALAX / GLYCOLAX Take 17 g by mouth daily. Mix one tablespoon with 8oz of your favorite juice or water every day until you are having soft formed stools. Then start taking once daily if you didn't have a stool the day before.   prochlorperazine 10 MG tablet Commonly known as:  COMPAZINE Take 1 tablet (10 mg total) by mouth every 6 (six) hours as needed (Nausea or vomiting).   pyridOXINE 50 MG tablet Commonly known as:  VITAMIN B-6 Take 50 mg by mouth daily.   senna-docusate 8.6-50 MG tablet Commonly known as:  Senokot-S Take 2 tablets by mouth daily.   tamsulosin 0.4 MG Caps capsule Commonly known as:  FLOMAX Take 1 capsule (0.4 mg total) by mouth daily.   Telmisartan-amLODIPine 80-5 MG Tabs Take 1 tablet by mouth daily.        Allergies: No Known Allergies  Family History: Family History  Problem Relation Age of Onset  . Hypertension Mother   . Stroke Father   . Kidney disease Neg Hx   . Prostate cancer Neg Hx   . Kidney cancer Neg Hx   . Bladder Cancer Neg Hx     Social History:  reports that he quit smoking about 19 years ago. He has never used smokeless tobacco. He reports previous drug use. Frequency: 2.00 times per week. Drug: Marijuana. He reports that he does not drink alcohol.  ROS: UROLOGY Frequent Urination?: No Hard to postpone urination?: No Burning/pain with urination?: No Get up at night to urinate?: No Leakage of urine?: No Urine stream starts and stops?: No Trouble starting stream?: No Do you have  to strain to urinate?: No Blood in urine?: No Urinary tract infection?: No Sexually transmitted disease?: No Injury to kidneys or bladder?: No Painful intercourse?: No Weak stream?: No Erection problems?: No Penile pain?: No Gastrointestinal Nausea?: No Vomiting?: No Indigestion/heartburn?: No Diarrhea?: No Constipation?: No Constitutional Fever: No Night sweats?: No Weight loss?: Yes Fatigue?: No Skin Skin rash/lesions?: No Itching?: No Eyes Blurred vision?: No Double vision?: No Ears/Nose/Throat Sore throat?: No Sinus problems?: No Hematologic/Lymphatic Swollen glands?: No Easy bruising?: No Cardiovascular Leg swelling?: No Chest pain?: No Respiratory Cough?: No Shortness of breath?: Yes Endocrine Excessive thirst?: No Musculoskeletal Back pain?: No Joint pain?: Yes Neurological Headaches?: No Dizziness?: No Psychologic Depression?: No Anxiety?: No   Physical Exam: BP 106/66 (BP Location: Left Arm, Patient Position: Sitting, Cuff Size: Normal)   Pulse 78   Ht 5\' 9"  (1.753 m)   Wt 174 lb 4.8 oz (79.1 kg)   BMI 25.74 kg/m   Constitutional: Well nourished. Alert and oriented, No acute distress. HEENT: [place] AT, moist mucus membranes. Trachea  midline, no masses. Cardiovascular: No clubbing, cyanosis, or edema. Respiratory: Normal respiratory effort, no increased work of breathing. Skin: No rashes, bruises or suspicious lesions. Neurologic: Grossly intact, no focal deficits, moving all 4 extremities. Psychiatric: Normal mood and affect.  Laboratory Data: PSA History:     2.66 ng/mL on 11/17/2011     4.54 ng/mL on 12/07/2013     3.3 ng/mL on 04/09/2014     4.3 ng/mL on 10/2014     6.3 ng/mL on 04/2015    7.1 ng/mL on 11/07/2015  10.4 ng/mL on 05/11/2016  7.8 ng/mL on 07/07/2016  86.8 ng/mL on 01/06/2017  64.78 ng/mL on 02/24/2017   24.53 ng/mL on 11/30/17  39.48 ng/mL on 12/16/17  65.71 ng/mL on 12/30/17  88.63 ng/mL on 01/13/18  215 ng/mL on 02/28/18  341 ng/mL on 03/28/18  488 ng/mL on 05/10/18   Results for orders placed or performed in visit on 05/12/18  Comprehensive metabolic panel  Result Value Ref Range   Sodium 134 (L) 135 - 145 mmol/L   Potassium 4.2 3.5 - 5.1 mmol/L   Chloride 104 98 - 111 mmol/L   CO2 23 22 - 32 mmol/L   Glucose, Bld 119 (H) 70 - 99 mg/dL   BUN 13 8 - 23 mg/dL   Creatinine, Ser 0.99 0.61 - 1.24 mg/dL   Calcium 9.7 8.9 - 10.3 mg/dL   Total Protein 7.5 6.5 - 8.1 g/dL   Albumin 3.8 3.5 - 5.0 g/dL   AST 31 15 - 41 U/L   ALT 19 0 - 44 U/L   Alkaline Phosphatase 39 38 - 126 U/L   Total Bilirubin 0.7 0.3 - 1.2 mg/dL   GFR calc non Af Amer >60 >60 mL/min   GFR calc Af Amer >60 >60 mL/min   Anion gap 7 5 - 15  CBC with Differential  Result Value Ref Range   WBC 6.3 4.0 - 10.5 K/uL   RBC 3.52 (L) 4.22 - 5.81 MIL/uL   Hemoglobin 9.8 (L) 13.0 - 17.0 g/dL   HCT 29.1 (L) 39.0 - 52.0 %   MCV 82.7 80.0 - 100.0 fL   MCH 27.8 26.0 - 34.0 pg   MCHC 33.7 30.0 - 36.0 g/dL   RDW 14.1 11.5 - 15.5 %   Platelets 172 150 - 400 K/uL   nRBC 0.0 0.0 - 0.2 %   Neutrophils Relative % 73 %   Neutro Abs 4.7 1.7 -  7.7 K/uL   Lymphocytes Relative 13 %   Lymphs Abs 0.8 0.7 - 4.0 K/uL   Monocytes Relative 12  %   Monocytes Absolute 0.8 0.1 - 1.0 K/uL   Eosinophils Relative 1 %   Eosinophils Absolute 0.0 0.0 - 0.5 K/uL   Basophils Relative 0 %   Basophils Absolute 0.0 0.0 - 0.1 K/uL   Immature Granulocytes 1 %   Abs Immature Granulocytes 0.03 0.00 - 0.07 K/uL   Lab Results  Component Value Date   WBC 6.3 05/12/2018   HGB 9.8 (L) 05/12/2018   HCT 29.1 (L) 05/12/2018   MCV 82.7 05/12/2018   PLT 172 05/12/2018    Lab Results  Component Value Date   CREATININE 0.99 05/12/2018   I have reviewed the labs  Pertinent imaging  Assessment & Plan:   1. Metastatic castrate resistant prostate cancer  Patient's PSA is 488 on 05/10/2018  Biopsy positive for Gleason's 9 - follow by cancer center currently on Zytiga Lupron given on 03/28/2018 at the cancer center Discontinue use of finasteride and dutasteride  Too early for Lupron injection; return for Lupron injection on the week of 09/27/17   2. LUTS Continue tamsulosin 0.4 mg daily RTC around 09/27/17 for Lupron injection   [name], PA-C  [facility] [place], [place] [address] [phone]  I, Lucas Mallow, am acting as a Education administrator for [employer],  I have reviewed the above documentation for accuracy and completeness, and I agree with the above.    [name], PA-C

## 2018-05-19 ENCOUNTER — Telehealth: Payer: Self-pay | Admitting: Genetics

## 2018-05-19 ENCOUNTER — Encounter: Payer: Self-pay | Admitting: Genetics

## 2018-05-19 NOTE — Telephone Encounter (Signed)
A genetic counseling appt has been scheduled for the pt to see Ferol Luz on 06/16/18 at 2pm. Letter mailed to the pt.

## 2018-05-20 ENCOUNTER — Inpatient Hospital Stay: Payer: Medicare Other

## 2018-05-20 ENCOUNTER — Inpatient Hospital Stay (HOSPITAL_BASED_OUTPATIENT_CLINIC_OR_DEPARTMENT_OTHER): Payer: Medicare Other | Admitting: Oncology

## 2018-05-20 ENCOUNTER — Encounter: Payer: Self-pay | Admitting: Oncology

## 2018-05-20 ENCOUNTER — Other Ambulatory Visit: Payer: Self-pay

## 2018-05-20 VITALS — BP 122/72 | HR 88 | Resp 16

## 2018-05-20 VITALS — BP 125/68 | HR 92 | Temp 95.1°F | Ht 69.0 in | Wt 172.6 lb

## 2018-05-20 DIAGNOSIS — I10 Essential (primary) hypertension: Secondary | ICD-10-CM

## 2018-05-20 DIAGNOSIS — I2510 Atherosclerotic heart disease of native coronary artery without angina pectoris: Secondary | ICD-10-CM

## 2018-05-20 DIAGNOSIS — G893 Neoplasm related pain (acute) (chronic): Secondary | ICD-10-CM

## 2018-05-20 DIAGNOSIS — R197 Diarrhea, unspecified: Secondary | ICD-10-CM

## 2018-05-20 DIAGNOSIS — R630 Anorexia: Secondary | ICD-10-CM

## 2018-05-20 DIAGNOSIS — D649 Anemia, unspecified: Secondary | ICD-10-CM | POA: Diagnosis not present

## 2018-05-20 DIAGNOSIS — K219 Gastro-esophageal reflux disease without esophagitis: Secondary | ICD-10-CM

## 2018-05-20 DIAGNOSIS — C61 Malignant neoplasm of prostate: Secondary | ICD-10-CM | POA: Diagnosis not present

## 2018-05-20 DIAGNOSIS — R634 Abnormal weight loss: Secondary | ICD-10-CM | POA: Diagnosis not present

## 2018-05-20 DIAGNOSIS — Z87891 Personal history of nicotine dependence: Secondary | ICD-10-CM

## 2018-05-20 DIAGNOSIS — C7951 Secondary malignant neoplasm of bone: Secondary | ICD-10-CM

## 2018-05-20 DIAGNOSIS — Z7982 Long term (current) use of aspirin: Secondary | ICD-10-CM

## 2018-05-20 DIAGNOSIS — R530 Neoplastic (malignant) related fatigue: Secondary | ICD-10-CM

## 2018-05-20 DIAGNOSIS — Z5111 Encounter for antineoplastic chemotherapy: Secondary | ICD-10-CM

## 2018-05-20 DIAGNOSIS — M1990 Unspecified osteoarthritis, unspecified site: Secondary | ICD-10-CM

## 2018-05-20 DIAGNOSIS — Z79899 Other long term (current) drug therapy: Secondary | ICD-10-CM

## 2018-05-20 LAB — CBC WITH DIFFERENTIAL/PLATELET
Abs Immature Granulocytes: 0.04 10*3/uL (ref 0.00–0.07)
Basophils Absolute: 0 10*3/uL (ref 0.0–0.1)
Basophils Relative: 0 %
Eosinophils Absolute: 0.1 10*3/uL (ref 0.0–0.5)
Eosinophils Relative: 1 %
HCT: 28.6 % — ABNORMAL LOW (ref 39.0–52.0)
Hemoglobin: 9.5 g/dL — ABNORMAL LOW (ref 13.0–17.0)
Immature Granulocytes: 1 %
Lymphocytes Relative: 15 %
Lymphs Abs: 0.8 10*3/uL (ref 0.7–4.0)
MCH: 27.6 pg (ref 26.0–34.0)
MCHC: 33.2 g/dL (ref 30.0–36.0)
MCV: 83.1 fL (ref 80.0–100.0)
Monocytes Absolute: 0.4 10*3/uL (ref 0.1–1.0)
Monocytes Relative: 9 %
NRBC: 0 % (ref 0.0–0.2)
Neutro Abs: 3.8 10*3/uL (ref 1.7–7.7)
Neutrophils Relative %: 74 %
Platelets: 185 10*3/uL (ref 150–400)
RBC: 3.44 MIL/uL — ABNORMAL LOW (ref 4.22–5.81)
RDW: 14.7 % (ref 11.5–15.5)
WBC: 5.1 10*3/uL (ref 4.0–10.5)

## 2018-05-20 LAB — COMPREHENSIVE METABOLIC PANEL
ALT: 19 U/L (ref 0–44)
AST: 33 U/L (ref 15–41)
Albumin: 3.8 g/dL (ref 3.5–5.0)
Alkaline Phosphatase: 38 U/L (ref 38–126)
Anion gap: 9 (ref 5–15)
BUN: 11 mg/dL (ref 8–23)
CO2: 22 mmol/L (ref 22–32)
Calcium: 9.5 mg/dL (ref 8.9–10.3)
Chloride: 109 mmol/L (ref 98–111)
Creatinine, Ser: 0.97 mg/dL (ref 0.61–1.24)
Glucose, Bld: 168 mg/dL — ABNORMAL HIGH (ref 70–99)
Potassium: 3.7 mmol/L (ref 3.5–5.1)
Sodium: 140 mmol/L (ref 135–145)
TOTAL PROTEIN: 7.5 g/dL (ref 6.5–8.1)
Total Bilirubin: 0.8 mg/dL (ref 0.3–1.2)

## 2018-05-20 MED ORDER — FENTANYL 50 MCG/HR TD PT72: 50.0000 ug | MEDICATED_PATCH | TRANSDERMAL | 0 refills | Status: DC

## 2018-05-20 MED ORDER — DEXAMETHASONE SODIUM PHOSPHATE 10 MG/ML IJ SOLN
10.0000 mg | Freq: Once | INTRAMUSCULAR | Status: AC
  Administered 2018-05-20: 10 mg via INTRAVENOUS
  Filled 2018-05-20: qty 1

## 2018-05-20 MED ORDER — SODIUM CHLORIDE 0.9 % IV SOLN
36.0000 mg/m2 | Freq: Once | INTRAVENOUS | Status: AC
  Administered 2018-05-20: 70 mg via INTRAVENOUS
  Filled 2018-05-20: qty 7

## 2018-05-20 MED ORDER — SODIUM CHLORIDE 0.9 % IV SOLN: 10.0000 mg | Freq: Once | INTRAVENOUS | Status: DC

## 2018-05-20 MED ORDER — OXYCODONE HCL 10 MG PO TABS: 10.0000 mg | ORAL_TABLET | Freq: Four times a day (QID) | ORAL | 0 refills | Status: DC | PRN

## 2018-05-20 MED ORDER — SODIUM CHLORIDE 0.9 % IV SOLN
Freq: Once | INTRAVENOUS | Status: AC
  Administered 2018-05-20: 11:00:00 via INTRAVENOUS
  Filled 2018-05-20: qty 250

## 2018-05-20 NOTE — Progress Notes (Signed)
Hematology/Oncology  Follow up note [facility] [phone] [phone]  Patient Care Team: [place] Pink, MD as PCP - General (Family Medicine) Earlie Server, MD as Medical Oncologist (Medical Oncology)  REASON FOR VISIT Follow up for antineoplasm treatment of Prostate Cancer  HISTORY OF PRESENTING ILLNESS:  Matthew Hunter 84 y.o.  male with PMH listed as below who was referred by [facility] Urology Provider [name] to me for evaluation of clinical prostate cancer and management.  He has a history of elevated PSA and BPH. PSA trends as follows: 11/14/2014 4.3, 11/07/2015 7.1, 05/11/2016 10.4, 06/11/2016 10.6, 07/07/2016 7.8, 01/06/2017 86.8.  He was seen by urology and was diagnosed with clinical prostate cancer given the extreme elevation of PSA and prostate nodule. Biopsy was not pursued. He was treated with [name] on 01/29/2017,  # prostate biopsy on 03/10/2017. 5 out of 6 cores positive for prostate cancer, with highest gleason score group 9 (4+5), + perineural invasion.  Stage IV prostate cancer, multiple osseus metastatic disease. No visceral involvement.   01/18/2018  CT scan image was independently reviewed by me and discussed with patient.  Patient has progression of bone metastatic disease only  Current treatment  Patient got first [place] on 01/29/2018, switched to leupron on 04/13/2015,  Q6 months.  .Testosterone level at 14, castration level.  Upfront Gillermina Phy was added on December 2019 to assist fast disease control (phase II trial [place] B, [facility] Oncol. 2014) S/p palliative RT to lumber spine and SI joints. Xtandi discontinued on due to CNS toxicity in April 2019.  12/09/17 Abiaterone discontinued.  7/ 18/2019 Start Apalutimide '240mg'$  daily.   Trend of tumor marker  PSA 86.8 -->64.78 -->131--> 242 added Xtanti --> 17.74-->1.82 Xtandi stopped due to CNS toxicity in April 2019. --> 1.79- (added Abiaterone)-> 11.06-->24.53--> 39.48  [Abiaterone stopped, Added Apalutimid]--> 65.71-->88.63--> Xofigo treatment--> 215--> Back on Xtandi reduced dose.   12/09/17 Abiaterone discontinued due to rising PSA and disease progression. 12/16/2017 Start Apalutimide '240mg'$  daily. Held on 01/13/2018 due to rising PSA, also causing patient to feel dizziness/black out.  Started Xofigo treatment on 02/23/2018. 02/28/2018, back on Xtandi reduced dose '120mg'$ .    INTERVAL HISTORY Patient presents to follow up for management of metastatic prostate cancer and follow-up on his metastatic prostate cancer related symptoms and assessment prior to chemotherapy treatment for prostate cancer.Marland Kitchen   #Has received 4 treatments of Xofigo. #Continue to have generalized bone pain, fentanyl patch has been increased to 50 MCG every 72 hours and oxycodone 5 mg every 6 hours as needed.  Reports pain is not well controlled.  Today pain is 8 out of 10.  #Appetite is poor, not eating very well.  Taking Megace 80 mg twice daily. #Xtandi has been stopped due to lack of efficacy. Patient has bone scan scheduled as well as radiation oncology appointments for evaluation of palliative radiation. Patient has attended chemotherapy education class.    ROS:  Review of Systems  Constitutional: Positive for appetite change, fatigue and unexpected weight change. Negative for chills and fever.  HENT:   Negative for hearing loss and voice change.   Eyes: Negative for eye problems and icterus.  Respiratory: Negative for chest tightness, cough and shortness of breath.   Cardiovascular: Negative for chest pain and leg swelling.  Gastrointestinal: Negative for abdominal distention and abdominal pain.  Endocrine: Negative for hot flashes.  Genitourinary: Negative for difficulty urinating, dysuria and frequency.   Musculoskeletal: Positive for arthralgias and back pain.  Skin: Negative for itching  and rash.  Neurological: Negative for light-headedness and numbness.  Hematological:  Negative for adenopathy. Does not bruise/bleed easily.  Psychiatric/Behavioral: Negative for confusion.    MEDICAL HISTORY:  Past Medical History:  Diagnosis Date  . Arthritis   . Benign enlargement of prostate   . CAD (coronary artery disease)   . Constipation   . Elevated PSA   . GERD (gastroesophageal reflux disease)   . Hypertension   . Incomplete bladder emptying   . Nocturia   . Prostate cancer ([place]) 05/03/2017   Rad tx's and Lupron shots.   . Urgency of micturation   . Urinary frequency     SURGICAL HISTORY: Past Surgical History:  Procedure Laterality Date  . GALLBLADDER SURGERY    . PROSTATE SURGERY    . STOMACH SURGERY      SOCIAL HISTORY: Social History   Socioeconomic History  . Marital status: Married    Spouse name: Not on file  . Number of children: Not on file  . Years of education: Not on file  . Highest education level: Not on file  Occupational History  . Not on file  Social Needs  . Financial resource strain: Not on file  . Food insecurity:    Worry: Not on file    Inability: Not on file  . Transportation needs:    Medical: Not on file    Non-medical: Not on file  Tobacco Use  . Smoking status: Former Smoker    Last attempt to quit: 06/01/1998    Years since quitting: 19.9  . Smokeless tobacco: Never Used  Substance and Sexual Activity  . Alcohol use: No    Alcohol/week: 0.0 standard drinks  . Drug use: Not Currently    Frequency: 2.0 times per week    Types: Marijuana    Comment: marijuana two time monthly gives him an appetite  . Sexual activity: Not on file  Lifestyle  . Physical activity:    Days per week: Not on file    Minutes per session: Not on file  . Stress: Not on file  Relationships  . Social connections:    Talks on phone: Not on file    Gets together: Not on file    Attends religious service: Not on file    Active member of club or organization: Not on file    Attends meetings of clubs or organizations: Not on  file    Relationship status: Not on file  . Intimate partner violence:    Fear of current or ex partner: Not on file    Emotionally abused: Not on file    Physically abused: Not on file    Forced sexual activity: Not on file  Other Topics Concern  . Not on file  Social History Narrative  . Not on file    FAMILY HISTORY: Family History  Problem Relation Age of Onset  . Hypertension Mother   . Stroke Father   . Kidney disease Neg Hx   . Prostate cancer Neg Hx   . Kidney cancer Neg Hx   . Bladder Cancer Neg Hx     ALLERGIES:  has No Known Allergies.  MEDICATIONS:  Current Outpatient Medications  Medication Sig Dispense Refill  . aspirin EC 81 MG tablet Take by mouth.    Marland Kitchen atenolol (TENORMIN) 100 MG tablet Take 100 mg by mouth daily.     . Calcium Carb-Cholecalciferol (CALCIUM-VITAMIN D) 500-200 MG-UNIT tablet Take 1 tablet by mouth daily. 90 tablet 0  .  finasteride (PROSCAR) 5 MG tablet Take 1 tablet (5 mg total) by mouth daily. 90 tablet 3  . hydrocortisone 2.5 % cream APP EXT AA BID  0  . megestrol (MEGACE) 40 MG tablet Take 2 tablets (80 mg total) by mouth 2 (two) times daily. 120 tablet 0  . mirabegron ER (MYRBETRIQ) 25 MG TB24 tablet Take 1 tablet (25 mg total) by mouth daily. 90 tablet 3  . mirtazapine (REMERON) 15 MG tablet Take 15 mg by mouth at bedtime.  11  . nortriptyline (PAMELOR) 25 MG capsule Take 25 mg by mouth at bedtime. Reported on 05/16/2015  1  . omeprazole (PRILOSEC) 20 MG capsule Take 20 mg by mouth daily.    . ondansetron (ZOFRAN) 8 MG tablet Take 1 tablet (8 mg total) by mouth 2 (two) times daily as needed for refractory nausea / vomiting. 30 tablet 1  . polyethylene glycol (MIRALAX / GLYCOLAX) packet Take 17 g by mouth daily. Mix one tablespoon with 8oz of your favorite juice or water every day until you are having soft formed stools. Then start taking once daily if you didn't have a stool the day before. 30 each 0  . prochlorperazine (COMPAZINE) 10 MG  tablet Take 1 tablet (10 mg total) by mouth every 6 (six) hours as needed (Nausea or vomiting). 30 tablet 1  . pyridOXINE (VITAMIN B-6) 50 MG tablet Take 50 mg by mouth daily.     Marland Kitchen senna-docusate (SENOKOT-S) 8.6-50 MG tablet Take 2 tablets by mouth daily. 60 tablet 3  . tamsulosin (FLOMAX) 0.4 MG CAPS capsule Take 1 capsule (0.4 mg total) by mouth daily. 90 capsule 3  . Telmisartan-Amlodipine 80-5 MG TABS Take 1 tablet by mouth daily.     . fentaNYL (DURAGESIC - DOSED MCG/HR) 50 MCG/HR Place 1 patch (50 mcg total) onto the skin every 3 (three) days. 10 patch 0  . Oxycodone HCl 10 MG TABS Take 1 tablet (10 mg total) by mouth every 6 (six) hours as needed. 60 tablet 0   No current facility-administered medications for this visit.       Marland Kitchen  PHYSICAL EXAMINATION: ECOG PERFORMANCE STATUS: 1 - Symptomatic but completely ambulatory Vitals:   05/20/18 0946  BP: 125/68  Pulse: 92  Temp: (!) 95.1 F (35.1 C)   Filed Weights   05/20/18 0946  Weight: 172 lb 9 oz (78.3 kg)   Physical Exam Constitutional:      General: He is not in acute distress.    Appearance: He is not diaphoretic.  HENT:     Head: Normocephalic and atraumatic.     Mouth/Throat:     Pharynx: No oropharyngeal exudate.  Eyes:     General: No scleral icterus.    Conjunctiva/sclera: Conjunctivae normal.     Pupils: Pupils are equal, round, and reactive to light.  Neck:     Musculoskeletal: Normal range of motion and neck supple.     Thyroid: No thyromegaly.  Cardiovascular:     Rate and Rhythm: Normal rate and regular rhythm.     Heart sounds: Normal heart sounds. No murmur.  Pulmonary:     Effort: Pulmonary effort is normal. No respiratory distress.     Breath sounds: Normal breath sounds. No wheezing.  Abdominal:     General: Bowel sounds are normal. There is no distension.     Palpations: Abdomen is soft. There is no mass.     Tenderness: There is no abdominal tenderness.  Musculoskeletal: Normal range of  motion.        General: No deformity.  Skin:    General: Skin is warm and dry.     Findings: No erythema or rash.  Neurological:     Mental Status: He is alert and oriented to person, place, and time.     Cranial Nerves: No cranial nerve deficit.     Coordination: Coordination normal.  Psychiatric:        Behavior: Behavior normal.        Thought Content: Thought content normal.    LABORATORY DATA:  I have reviewed the data as listed Lab Results  Component Value Date   WBC 5.1 05/20/2018   HGB 9.5 (L) 05/20/2018   HCT 28.6 (L) 05/20/2018   MCV 83.1 05/20/2018   PLT 185 05/20/2018   Recent Labs    05/10/18 0934 05/12/18 1107 05/20/18 0854  NA 135 134* 140  K 3.9 4.2 3.7  CL 105 104 109  CO2 '23 23 22  '$ GLUCOSE 155* 119* 168*  BUN '11 13 11  '$ CREATININE 0.98 0.99 0.97  CALCIUM 9.6 9.7 9.5  GFRNONAA >60 >60 >60  GFRAA >60 >60 >60  PROT 7.2 7.5 7.5  ALBUMIN 3.6 3.8 3.8  AST 34 31 33  ALT '17 19 19  '$ ALKPHOS 38 39 38  BILITOT 0.8 0.7 0.8   RADIOGRAPHIC STUDIES: I have personally reviewed the radiological images as listed and agreed with the findings in the report. 12/15/2017 Bone scan whole body showed new sites of osseous tracer accumulation are identified especially to size at the distal left femoral diaphysis, L2 vertebral body, right iliac bone, right ischium. 01/18/2018 chest abdomen pelvis with contrast Extensive metastasis disease to bone, with all bone lesions new from the abdomen and pelvis CT dated 9/5 2018 and a chest CT dated 06/19/2013.  Involvement is similar to the recent bone scan.  No other evidence of metastatic disease.  No acute abnormalities in the chest abdomen and pelvis.  03/14/2018 CT chest Angio PE protocol 1. No acute cardiopulmonary abnormalities. No evidence for acute pulmonary embolus 2. Coronary artery atherosclerotic calcifications 3.  Aortic Atherosclerosis (ICD10-I70.0). 4. Sclerotic bone metastasis as noted previously  03/25/2018 CT  abdomen pelvis with contrast  1. No acute abnormality in the abdomen or pelvis. 2. Extensive metastatic bone disease. Findings are similar to theexam on 01/18/2018.  3. Nonobstructive left kidney stone. 4. Stable left adrenal nodule.  ASSESSMENT & PLAN: 84 yo male follows up for management of metastatic prostate cancer and neoplasm related symptoms..  1. Prostate cancer metastatic to bone ([place])   2. Neoplasm related pain   3. Weight loss   4. Neoplastic malignant related fatigue   5. Encounter for antineoplastic chemotherapy    #Castration resistant prostate cancer, PSA continues to rise to 488 despite being on Xtandi.  Gillermina Phy has been discontinued. Status post 4 out of 6 planned [facility] treatment.  Alkaline phosphatase remains normal.  LDH mildly elevated. PSA continue to rise. I have discussed with the patient about rationale of using low-dose weekly docetaxel in conjunction with Xofigo treatments.  I explained to the patient the risks and benefits of chemotherapy including all but not limited to hair loss, mouth sore, nausea, vomiting, low blood counts, bleeding, neuropathy, infusion reaction and risk of life threatening infection and even death, secondary malignancy etc.  Patient voices understanding and willing to proceed chemotherapy.  Also went over antiemetics instructions with patient.  #Somatic BRCA1 mutation positive.  PARP inhibitors  can be considered  as the next line of treatment.  Combination of PARP inhibitors and Trudi Ida has not been well studied. He has also been referred to genetic counseling.  #Bone metastasis, will proceed Xgeva at next visit.  # Bone pain due to metastatic bone disease, advise patient to continue fentanyl patch to 47mg Q72 hours, increase oxycodone to 10 mg every 6 hours as needed.  Refills sent to pharmacy.  He has bone scan scheduled as well as appointment with Dr. CBaruch Gouty We spent sufficient time to discuss many aspect of care, questions were  answered to patient's satisfaction. Return of visit: 1 week for evaluation prior to Docetaxel treatment   ZEarlie Server MD, PhD Hematology Oncology [facility] ARes[facility] UclaPager- [phone]/20/2019

## 2018-05-26 ENCOUNTER — Institutional Professional Consult (permissible substitution): Payer: Medicare Other | Admitting: Radiation Oncology

## 2018-05-27 ENCOUNTER — Encounter: Payer: Self-pay | Admitting: Oncology

## 2018-05-27 ENCOUNTER — Other Ambulatory Visit: Payer: Self-pay

## 2018-05-27 ENCOUNTER — Inpatient Hospital Stay: Payer: Medicare Other

## 2018-05-27 ENCOUNTER — Inpatient Hospital Stay (HOSPITAL_BASED_OUTPATIENT_CLINIC_OR_DEPARTMENT_OTHER): Payer: Medicare Other | Admitting: Oncology

## 2018-05-27 VITALS — BP 118/62 | HR 79 | Temp 95.6°F | Resp 16 | Ht 69.0 in | Wt 172.4 lb

## 2018-05-27 DIAGNOSIS — M1990 Unspecified osteoarthritis, unspecified site: Secondary | ICD-10-CM

## 2018-05-27 DIAGNOSIS — Z79899 Other long term (current) drug therapy: Secondary | ICD-10-CM

## 2018-05-27 DIAGNOSIS — R634 Abnormal weight loss: Secondary | ICD-10-CM

## 2018-05-27 DIAGNOSIS — I10 Essential (primary) hypertension: Secondary | ICD-10-CM

## 2018-05-27 DIAGNOSIS — D649 Anemia, unspecified: Secondary | ICD-10-CM | POA: Diagnosis not present

## 2018-05-27 DIAGNOSIS — R197 Diarrhea, unspecified: Secondary | ICD-10-CM

## 2018-05-27 DIAGNOSIS — G893 Neoplasm related pain (acute) (chronic): Secondary | ICD-10-CM

## 2018-05-27 DIAGNOSIS — C61 Malignant neoplasm of prostate: Secondary | ICD-10-CM

## 2018-05-27 DIAGNOSIS — C7951 Secondary malignant neoplasm of bone: Secondary | ICD-10-CM

## 2018-05-27 DIAGNOSIS — K219 Gastro-esophageal reflux disease without esophagitis: Secondary | ICD-10-CM

## 2018-05-27 DIAGNOSIS — I2510 Atherosclerotic heart disease of native coronary artery without angina pectoris: Secondary | ICD-10-CM

## 2018-05-27 DIAGNOSIS — R530 Neoplastic (malignant) related fatigue: Secondary | ICD-10-CM

## 2018-05-27 DIAGNOSIS — Z7982 Long term (current) use of aspirin: Secondary | ICD-10-CM

## 2018-05-27 DIAGNOSIS — R630 Anorexia: Secondary | ICD-10-CM

## 2018-05-27 DIAGNOSIS — Z87891 Personal history of nicotine dependence: Secondary | ICD-10-CM

## 2018-05-27 DIAGNOSIS — Z5111 Encounter for antineoplastic chemotherapy: Secondary | ICD-10-CM

## 2018-05-27 LAB — CBC WITH DIFFERENTIAL/PLATELET
Abs Immature Granulocytes: 0.04 10*3/uL (ref 0.00–0.07)
Basophils Absolute: 0 10*3/uL (ref 0.0–0.1)
Basophils Relative: 1 %
Eosinophils Absolute: 0 10*3/uL (ref 0.0–0.5)
Eosinophils Relative: 0 %
HCT: 24.2 % — ABNORMAL LOW (ref 39.0–52.0)
Hemoglobin: 8.1 g/dL — ABNORMAL LOW (ref 13.0–17.0)
Immature Granulocytes: 1 %
Lymphocytes Relative: 17 %
Lymphs Abs: 0.6 10*3/uL — ABNORMAL LOW (ref 0.7–4.0)
MCH: 28.1 pg (ref 26.0–34.0)
MCHC: 33.5 g/dL (ref 30.0–36.0)
MCV: 84 fL (ref 80.0–100.0)
MONO ABS: 0.3 10*3/uL (ref 0.1–1.0)
Monocytes Relative: 8 %
Neutro Abs: 2.5 10*3/uL (ref 1.7–7.7)
Neutrophils Relative %: 73 %
Platelets: 170 10*3/uL (ref 150–400)
RBC: 2.88 MIL/uL — ABNORMAL LOW (ref 4.22–5.81)
RDW: 14.5 % (ref 11.5–15.5)
WBC: 3.4 10*3/uL — AB (ref 4.0–10.5)
nRBC: 0 % (ref 0.0–0.2)

## 2018-05-27 LAB — COMPREHENSIVE METABOLIC PANEL
ALT: 17 U/L (ref 0–44)
AST: 33 U/L (ref 15–41)
Albumin: 3.6 g/dL (ref 3.5–5.0)
Alkaline Phosphatase: 34 U/L — ABNORMAL LOW (ref 38–126)
Anion gap: 9 (ref 5–15)
BUN: 25 mg/dL — AB (ref 8–23)
CO2: 22 mmol/L (ref 22–32)
Calcium: 9.5 mg/dL (ref 8.9–10.3)
Chloride: 105 mmol/L (ref 98–111)
Creatinine, Ser: 1.06 mg/dL (ref 0.61–1.24)
GFR calc Af Amer: >60 mL/min (ref >60)
Glucose, Bld: 127 mg/dL — ABNORMAL HIGH (ref 70–99)
Potassium: 4.5 mmol/L (ref 3.5–5.1)
Sodium: 136 mmol/L (ref 135–145)
Total Bilirubin: 0.5 mg/dL (ref 0.3–1.2)
Total Protein: 7.4 g/dL (ref 6.5–8.1)

## 2018-05-27 MED ORDER — SODIUM CHLORIDE 0.9 % IV SOLN
36.0000 mg/m2 | Freq: Once | INTRAVENOUS | Status: AC
  Administered 2018-05-27: 70 mg via INTRAVENOUS
  Filled 2018-05-27: qty 7

## 2018-05-27 MED ORDER — DEXAMETHASONE SODIUM PHOSPHATE 10 MG/ML IJ SOLN
10.0000 mg | Freq: Once | INTRAMUSCULAR | Status: AC
  Administered 2018-05-27: 10 mg via INTRAVENOUS
  Filled 2018-05-27: qty 1

## 2018-05-27 MED ORDER — SODIUM CHLORIDE 0.9 % IV SOLN
Freq: Once | INTRAVENOUS | Status: AC
  Administered 2018-05-27: 14:00:00 via INTRAVENOUS
  Filled 2018-05-27: qty 250

## 2018-05-27 NOTE — Progress Notes (Signed)
Hematology/Oncology  Follow up note [facility] Telephone:([phone]) [phone] Fax:([phone]) [phone]  Patient Care Team: [place] Pink, MD as PCP - General (Family Medicine) Earlie Server, MD as Medical Oncologist (Medical Oncology)  REASON FOR VISIT Follow up for antineoplasm treatment of Prostate Cancer  HISTORY OF PRESENTING ILLNESS:  Matthew Hunter 84 y.o.  male with PMH listed as below who was referred by [facility] Provider [name] to me for evaluation of clinical prostate cancer and management.  He has a history of elevated PSA and BPH. PSA trends as follows: 11/14/2014 4.3, 11/07/2015 7.1, 05/11/2016 10.4, 06/11/2016 10.6, 07/07/2016 7.8, 01/06/2017 86.8.  He was seen by urology and was diagnosed with clinical prostate cancer given the extreme elevation of PSA and prostate nodule. Biopsy was not pursued. He was treated with [name] on 01/29/2017,  # prostate biopsy on 03/10/2017. 5 out of 6 cores positive for prostate cancer, with highest gleason score group 9 (4+5), + perineural invasion.  Stage IV prostate cancer, multiple osseus metastatic disease. No visceral involvement.   01/18/2018  CT scan image was independently reviewed by me and discussed with patient.  Patient has progression of bone metastatic disease only  Current treatment  Patient got first [place] on 01/29/2018, switched to leupron on 04/13/2015,  Q6 months.  .Testosterone level at 14, castration level.  Upfront Gillermina Phy was added on December 2019 to assist fast disease control (phase II trial [place] B, [facility] Oncol. 2014) S/p palliative RT to lumber spine and SI joints. Xtandi discontinued on due to CNS toxicity in April 2019.  12/09/17 Abiaterone discontinued.  7/ 18/2019 Start Apalutimide 225m daily.   Trend of tumor marker  PSA 86.8 -->64.78 -->131--> 242 added Xtanti --> 17.74-->1.82 Xtandi stopped due to CNS toxicity in April 2019. --> 1.79- (added Abiaterone)-> 11.06-->24.53--> 39.48  [Abiaterone stopped, Added Apalutimid]--> 65.71-->88.63--> Xofigo treatment--> 215--> Back on Xtandi reduced dose.   12/09/17 Abiaterone discontinued due to rising PSA and disease progression. 12/16/2017 Start Apalutimide 2491mdaily. Held on 01/13/2018 due to rising PSA, also causing patient to feel dizziness/black out.  Started Xofigo treatment on 02/23/2018. 02/28/2018, back on Xtandi reduced dose 12025m   INTERVAL HISTORY Patient presents to follow up for management of metastatic prostate cancer and follow-up on his metastatic prostate cancer related symptoms and assessment prior to chemotherapy treatment for prostate cancer..  Marland Kitchen#Has received 4 treatments of Xofigo. S/p one dose of Docetaxol on 05/20/2018.reports tolerates well. Denies any nausea or vomiting.  Chronic pain is better after using Fentanyl patch 65m45m72 hours and Oxycodone 5mg 43m as needed.   #Appetite is poor, not eating very well.  Taking Megace 80 mg twice daily. Weight is stable since last visit.  Patient has bone scan scheduled as well as radiation oncology appointments for evaluation of palliative radiation. # chronic fatigue at baseline.     ROS:  Review of Systems  Constitutional: Positive for appetite change and fatigue. Negative for chills and fever.  HENT:   Negative for hearing loss and voice change.   Eyes: Negative for eye problems and icterus.  Respiratory: Negative for chest tightness, cough and shortness of breath.   Cardiovascular: Negative for chest pain and leg swelling.  Gastrointestinal: Negative for abdominal distention and abdominal pain.  Endocrine: Negative for hot flashes.  Genitourinary: Negative for difficulty urinating, dysuria and frequency.   Musculoskeletal: Positive for arthralgias and back pain.  Skin: Negative for itching and rash.  Neurological: Negative for light-headedness and numbness.  Hematological: Negative for  adenopathy. Does not bruise/bleed easily.    Psychiatric/Behavioral: Negative for confusion.    MEDICAL HISTORY:  Past Medical History:  Diagnosis Date  . Arthritis   . Benign enlargement of prostate   . CAD (coronary artery disease)   . Constipation   . Elevated PSA   . GERD (gastroesophageal reflux disease)   . Hypertension   . Incomplete bladder emptying   . Nocturia   . Prostate cancer ([place]) 05/03/2017   Rad tx's and Lupron shots.   . Urgency of micturation   . Urinary frequency     SURGICAL HISTORY: Past Surgical History:  Procedure Laterality Date  . GALLBLADDER SURGERY    . PROSTATE SURGERY    . STOMACH SURGERY      SOCIAL HISTORY: Social History   Socioeconomic History  . Marital status: Married    Spouse name: Not on file  . Number of children: Not on file  . Years of education: Not on file  . Highest education level: Not on file  Occupational History  . Not on file  Social Needs  . Financial resource strain: Not on file  . Food insecurity:    Worry: Not on file    Inability: Not on file  . Transportation needs:    Medical: Not on file    Non-medical: Not on file  Tobacco Use  . Smoking status: Former Smoker    Last attempt to quit: 06/01/1998    Years since quitting: 20.0  . Smokeless tobacco: Never Used  Substance and Sexual Activity  . Alcohol use: No    Alcohol/week: 0.0 standard drinks  . Drug use: Not Currently    Frequency: 2.0 times per week    Types: Marijuana    Comment: marijuana two time monthly gives him an appetite  . Sexual activity: Not on file  Lifestyle  . Physical activity:    Days per week: Not on file    Minutes per session: Not on file  . Stress: Not on file  Relationships  . Social connections:    Talks on phone: Not on file    Gets together: Not on file    Attends religious service: Not on file    Active member of club or organization: Not on file    Attends meetings of clubs or organizations: Not on file    Relationship status: Not on file  . Intimate  partner violence:    Fear of current or ex partner: Not on file    Emotionally abused: Not on file    Physically abused: Not on file    Forced sexual activity: Not on file  Other Topics Concern  . Not on file  Social History Narrative  . Not on file    FAMILY HISTORY: Family History  Problem Relation Age of Onset  . Hypertension Mother   . Stroke Father   . Kidney disease Neg Hx   . Prostate cancer Neg Hx   . Kidney cancer Neg Hx   . Bladder Cancer Neg Hx     ALLERGIES:  has No Known Allergies.  MEDICATIONS:  Current Outpatient Medications  Medication Sig Dispense Refill  . aspirin EC 81 MG tablet Take by mouth.    Marland Kitchen atenolol (TENORMIN) 100 MG tablet Take 100 mg by mouth daily.     . Calcium Carb-Cholecalciferol (CALCIUM-VITAMIN D) 500-200 MG-UNIT tablet Take 1 tablet by mouth daily. 90 tablet 0  . fentaNYL (DURAGESIC - DOSED MCG/HR) 50 MCG/HR Place 1 patch (50  mcg total) onto the skin every 3 (three) days. 10 patch 0  . finasteride (PROSCAR) 5 MG tablet Take 1 tablet (5 mg total) by mouth daily. 90 tablet 3  . hydrocortisone 2.5 % cream APP EXT AA BID  0  . megestrol (MEGACE) 40 MG tablet Take 2 tablets (80 mg total) by mouth 2 (two) times daily. 120 tablet 0  . mirabegron ER (MYRBETRIQ) 25 MG TB24 tablet Take 1 tablet (25 mg total) by mouth daily. 90 tablet 3  . mirtazapine (REMERON) 15 MG tablet Take 15 mg by mouth at bedtime.  11  . nortriptyline (PAMELOR) 25 MG capsule Take 25 mg by mouth at bedtime. Reported on 05/16/2015  1  . omeprazole (PRILOSEC) 20 MG capsule Take 20 mg by mouth daily.    . ondansetron (ZOFRAN) 8 MG tablet Take 1 tablet (8 mg total) by mouth 2 (two) times daily as needed for refractory nausea / vomiting. 30 tablet 1  . Oxycodone HCl 10 MG TABS Take 1 tablet (10 mg total) by mouth every 6 (six) hours as needed. 60 tablet 0  . polyethylene glycol (MIRALAX / GLYCOLAX) packet Take 17 g by mouth daily. Mix one tablespoon with 8oz of your favorite juice or  water every day until you are having soft formed stools. Then start taking once daily if you didn't have a stool the day before. 30 each 0  . prochlorperazine (COMPAZINE) 10 MG tablet Take 1 tablet (10 mg total) by mouth every 6 (six) hours as needed (Nausea or vomiting). 30 tablet 1  . pyridOXINE (VITAMIN B-6) 50 MG tablet Take 50 mg by mouth daily.     Marland Kitchen senna-docusate (SENOKOT-S) 8.6-50 MG tablet Take 2 tablets by mouth daily. 60 tablet 3  . tamsulosin (FLOMAX) 0.4 MG CAPS capsule Take 1 capsule (0.4 mg total) by mouth daily. 90 capsule 3  . Telmisartan-Amlodipine 80-5 MG TABS Take 1 tablet by mouth daily.      No current facility-administered medications for this visit.       Marland Kitchen  PHYSICAL EXAMINATION: ECOG PERFORMANCE STATUS: 1 - Symptomatic but completely ambulatory Vitals:   05/27/18 1317  BP: 118/62  Pulse: 79  Resp: 16  Temp: (!) 95.6 F (35.3 C)   Filed Weights   05/27/18 1317  Weight: 172 lb 7 oz (78.2 kg)   Physical Exam Constitutional:      General: He is not in acute distress.    Appearance: He is not diaphoretic.  HENT:     Head: Normocephalic and atraumatic.     Mouth/Throat:     Pharynx: No oropharyngeal exudate.  Eyes:     General: No scleral icterus.    Conjunctiva/sclera: Conjunctivae normal.     Pupils: Pupils are equal, round, and reactive to light.  Neck:     Musculoskeletal: Normal range of motion and neck supple.     Thyroid: No thyromegaly.  Cardiovascular:     Rate and Rhythm: Normal rate and regular rhythm.     Heart sounds: Normal heart sounds. No murmur.  Pulmonary:     Effort: Pulmonary effort is normal. No respiratory distress.     Breath sounds: Normal breath sounds. No wheezing.  Abdominal:     General: Bowel sounds are normal. There is no distension.     Palpations: Abdomen is soft. There is no mass.     Tenderness: There is no abdominal tenderness.  Musculoskeletal: Normal range of motion.        General:  No deformity.  Skin:     General: Skin is warm and dry.     Findings: No erythema or rash.  Neurological:     Mental Status: He is alert and oriented to person, place, and time.     Cranial Nerves: No cranial nerve deficit.     Coordination: Coordination normal.  Psychiatric:        Behavior: Behavior normal.        Thought Content: Thought content normal.    LABORATORY DATA:  I have reviewed the data as listed Lab Results  Component Value Date   WBC 3.4 (L) 05/27/2018   HGB 8.1 (L) 05/27/2018   HCT 24.2 (L) 05/27/2018   MCV 84.0 05/27/2018   PLT 170 05/27/2018   Recent Labs    05/12/18 1107 05/20/18 0854 05/27/18 1235  NA 134* 140 136  K 4.2 3.7 4.5  CL 104 109 105  CO2 _0 GLUCOSE 119* 168* 127*  BUN 13 11 25*  CREATININE 0.99 0.97 1.06  CALCIUM 9.7 9.5 9.5  GFRNONAA >60 >60 >60  GFRAA >60 >60 >60  PROT 7.5 7.5 7.4  ALBUMIN 3.8 3.8 3.6  AST 31 33 33  ALT _1 ALKPHOS 39 38 34*  BILITOT 0.7 0.8 0.5   RADIOGRAPHIC STUDIES: I have personally reviewed the radiological images as listed and agreed with the findings in the report. 12/15/2017 Bone scan whole body showed new sites of osseous tracer accumulation are identified especially to size at the distal left femoral diaphysis, L2 vertebral body, right iliac bone, right ischium. 01/18/2018 chest abdomen pelvis with contrast Extensive metastasis disease to bone, with all bone lesions new from the abdomen and pelvis CT dated 9/5 2018 and a chest CT dated 06/19/2013.  Involvement is similar to the recent bone scan.  No other evidence of metastatic disease.  No acute abnormalities in the chest abdomen and pelvis.  03/14/2018 CT chest Angio PE protocol 1. No acute cardiopulmonary abnormalities. No evidence for acute pulmonary embolus 2. Coronary artery atherosclerotic calcifications 3.  Aortic Atherosclerosis (ICD10-I70.0). 4. Sclerotic bone metastasis as noted previously  03/25/2018 CT abdomen pelvis with contrast  1. No acute  abnormality in the abdomen or pelvis. 2. Extensive metastatic bone disease. Findings are similar to theexam on 01/18/2018.  3. Nonobstructive left kidney stone. 4. Stable left adrenal nodule.  ASSESSMENT & PLAN: 84 yo male follows up for management of metastatic prostate cancer and neoplasm related symptoms..  1. Prostate cancer metastatic to bone ([place])   2. Encounter for antineoplastic chemotherapy   3. Neoplasm related pain   4. Anemia, unspecified type   5. Bone metastasis ([place])    #Castration resistant prostate cancer, PSA continues to rise to 488 despite being on Xtandi.  Gillermina Phy has been discontinued. Status post 4 out of 6 planned [facility] treatment.  Alkaline phosphatase remains normal.  LDH mildly elevated. PSA continue to rise. Tolerated low dose Docetaxol x 1 last week.  Labs are reviewed and discussed with patient. Proceed with cycle 2 low dose Docetaxol.  Discussed with patient about medi port placement. He agrees. Will refer to vascular surgery.   # Somatic BRCA1 mutation positive. Has been referred to genetic counseling. PARP inhibitors  can be considered as the next line of treatment. # Bone metastatic disease, proceed with Xgeva treatment today. Continue Xofigo treatments.  # Neoplasm related pain, continue current regimen.    I have discussed with the patient about rationale of using low-dose  weekly docetaxel in conjunction with Xofigo treatments.  I explained to the patient the risks and benefits of chemotherapy including all but not limited to hair loss, mouth sore, nausea, vomiting, low blood counts, bleeding, neuropathy, infusion reaction and risk of life threatening infection and even death, secondary malignancy etc.  Patient voices understanding and willing to proceed chemotherapy.  Also went over antiemetics instructions with patient.  #Somatic BRCA1 mutation positive.    Combination of PARP inhibitors and Trudi Ida has not been well studied. He has also been  referred to genetic counseling.  #Bone metastasis, will proceed Xgeva at next visit. We spent sufficient time to discuss many aspect of care, questions were answered to patient's satisfaction.  Return of visit: 1 week for evaluation prior to Docetaxel treatment Total face to face encounter time for this patient visit was 7mn. >50% of the time was  spent in counseling and coordination of care.   ZEarlie Server MD, PhD Hematology Oncology CIntegris Health Edmondat AMadera Ambulatory Endoscopy CenterPager- [phone]/27/2019

## 2018-05-30 ENCOUNTER — Encounter
Admission: RE | Admit: 2018-05-30 | Discharge: 2018-05-30 | Disposition: A | Discharge status: Home / Self Care | Payer: Medicare Other | Source: Ambulatory Visit | Attending: Oncology | Admitting: Oncology

## 2018-05-30 ENCOUNTER — Telehealth (INDEPENDENT_AMBULATORY_CARE_PROVIDER_SITE_OTHER): Payer: Self-pay

## 2018-05-30 ENCOUNTER — Encounter (INDEPENDENT_AMBULATORY_CARE_PROVIDER_SITE_OTHER): Payer: Self-pay

## 2018-05-30 DIAGNOSIS — G893 Neoplasm related pain (acute) (chronic): Secondary | ICD-10-CM | POA: Insufficient documentation

## 2018-05-30 DIAGNOSIS — C61 Malignant neoplasm of prostate: Secondary | ICD-10-CM | POA: Diagnosis present

## 2018-05-30 MED ORDER — TECHNETIUM TC 99M MEDRONATE IV KIT
20.0000 | PACK | Freq: Once | INTRAVENOUS | Status: AC | PRN
  Administered 2018-05-30: 23.58 via INTRAVENOUS

## 2018-05-30 NOTE — Telephone Encounter (Signed)
Patient's wife returned the call and the patient was scheduled for 06/07/18 with Dr. Delana Meyer . Patient's arrival time is 1:00 pm and all of this was given to the patient as well as being mailed out to the patient.

## 2018-05-30 NOTE — Telephone Encounter (Signed)
A message was left for a return call to schedule the patient for a port insertion.

## 2018-06-03 ENCOUNTER — Encounter: Payer: Self-pay | Admitting: Oncology

## 2018-06-03 ENCOUNTER — Inpatient Hospital Stay: Payer: Medicare Other

## 2018-06-03 ENCOUNTER — Other Ambulatory Visit: Payer: Self-pay

## 2018-06-03 ENCOUNTER — Inpatient Hospital Stay: Payer: Medicare Other | Attending: Oncology

## 2018-06-03 ENCOUNTER — Inpatient Hospital Stay (HOSPITAL_BASED_OUTPATIENT_CLINIC_OR_DEPARTMENT_OTHER): Payer: Medicare Other | Admitting: Oncology

## 2018-06-03 ENCOUNTER — Other Ambulatory Visit: Payer: Self-pay | Admitting: Oncology

## 2018-06-03 VITALS — BP 102/58 | HR 81 | Temp 96.2°F | Resp 20 | Wt 172.3 lb

## 2018-06-03 DIAGNOSIS — R197 Diarrhea, unspecified: Secondary | ICD-10-CM

## 2018-06-03 DIAGNOSIS — R630 Anorexia: Secondary | ICD-10-CM

## 2018-06-03 DIAGNOSIS — Z1501 Genetic susceptibility to malignant neoplasm of breast: Secondary | ICD-10-CM | POA: Diagnosis not present

## 2018-06-03 DIAGNOSIS — R634 Abnormal weight loss: Secondary | ICD-10-CM | POA: Diagnosis not present

## 2018-06-03 DIAGNOSIS — K5903 Drug induced constipation: Secondary | ICD-10-CM | POA: Diagnosis not present

## 2018-06-03 DIAGNOSIS — R530 Neoplastic (malignant) related fatigue: Secondary | ICD-10-CM | POA: Insufficient documentation

## 2018-06-03 DIAGNOSIS — C61 Malignant neoplasm of prostate: Secondary | ICD-10-CM | POA: Diagnosis not present

## 2018-06-03 DIAGNOSIS — I2510 Atherosclerotic heart disease of native coronary artery without angina pectoris: Secondary | ICD-10-CM | POA: Diagnosis not present

## 2018-06-03 DIAGNOSIS — R5383 Other fatigue: Secondary | ICD-10-CM | POA: Insufficient documentation

## 2018-06-03 DIAGNOSIS — C7951 Secondary malignant neoplasm of bone: Secondary | ICD-10-CM | POA: Insufficient documentation

## 2018-06-03 DIAGNOSIS — M1990 Unspecified osteoarthritis, unspecified site: Secondary | ICD-10-CM

## 2018-06-03 DIAGNOSIS — I10 Essential (primary) hypertension: Secondary | ICD-10-CM | POA: Diagnosis not present

## 2018-06-03 DIAGNOSIS — G893 Neoplasm related pain (acute) (chronic): Secondary | ICD-10-CM | POA: Diagnosis not present

## 2018-06-03 DIAGNOSIS — Z5111 Encounter for antineoplastic chemotherapy: Secondary | ICD-10-CM | POA: Diagnosis not present

## 2018-06-03 DIAGNOSIS — M549 Dorsalgia, unspecified: Secondary | ICD-10-CM | POA: Diagnosis not present

## 2018-06-03 DIAGNOSIS — K219 Gastro-esophageal reflux disease without esophagitis: Secondary | ICD-10-CM | POA: Insufficient documentation

## 2018-06-03 DIAGNOSIS — E860 Dehydration: Secondary | ICD-10-CM

## 2018-06-03 DIAGNOSIS — Z7982 Long term (current) use of aspirin: Secondary | ICD-10-CM

## 2018-06-03 DIAGNOSIS — T402X5S Adverse effect of other opioids, sequela: Secondary | ICD-10-CM | POA: Diagnosis not present

## 2018-06-03 DIAGNOSIS — Z87891 Personal history of nicotine dependence: Secondary | ICD-10-CM

## 2018-06-03 DIAGNOSIS — Z79899 Other long term (current) drug therapy: Secondary | ICD-10-CM | POA: Diagnosis not present

## 2018-06-03 DIAGNOSIS — N401 Enlarged prostate with lower urinary tract symptoms: Secondary | ICD-10-CM | POA: Diagnosis not present

## 2018-06-03 DIAGNOSIS — Z803 Family history of malignant neoplasm of breast: Secondary | ICD-10-CM | POA: Insufficient documentation

## 2018-06-03 DIAGNOSIS — R338 Other retention of urine: Secondary | ICD-10-CM | POA: Diagnosis not present

## 2018-06-03 DIAGNOSIS — R7989 Other specified abnormal findings of blood chemistry: Secondary | ICD-10-CM | POA: Insufficient documentation

## 2018-06-03 LAB — COMPREHENSIVE METABOLIC PANEL
ALBUMIN: 3.7 g/dL (ref 3.5–5.0)
ALT: 17 U/L (ref 0–44)
AST: 44 U/L — ABNORMAL HIGH (ref 15–41)
Alkaline Phosphatase: 35 U/L — ABNORMAL LOW (ref 38–126)
Anion gap: 9 (ref 5–15)
BUN: 17 mg/dL (ref 8–23)
CO2: 21 mmol/L — ABNORMAL LOW (ref 22–32)
Calcium: 9.2 mg/dL (ref 8.9–10.3)
Chloride: 105 mmol/L (ref 98–111)
Creatinine, Ser: 1.15 mg/dL (ref 0.61–1.24)
GFR calc Af Amer: >60 mL/min (ref >60)
GFR calc non Af Amer: 57 mL/min — ABNORMAL LOW (ref >60)
GLUCOSE: 132 mg/dL — AB (ref 70–99)
Potassium: 4 mmol/L (ref 3.5–5.1)
SODIUM: 135 mmol/L (ref 135–145)
Total Bilirubin: 0.8 mg/dL (ref 0.3–1.2)
Total Protein: 7.4 g/dL (ref 6.5–8.1)

## 2018-06-03 LAB — CBC WITH DIFFERENTIAL/PLATELET
Abs Immature Granulocytes: 0.02 10*3/uL (ref 0.00–0.07)
Basophils Absolute: 0 10*3/uL (ref 0.0–0.1)
Basophils Relative: 0 %
Eosinophils Absolute: 0 10*3/uL (ref 0.0–0.5)
Eosinophils Relative: 0 %
HCT: 24.5 % — ABNORMAL LOW (ref 39.0–52.0)
Hemoglobin: 8.3 g/dL — ABNORMAL LOW (ref 13.0–17.0)
Immature Granulocytes: 1 %
Lymphocytes Relative: 23 %
Lymphs Abs: 0.6 10*3/uL — ABNORMAL LOW (ref 0.7–4.0)
MCH: 28.1 pg (ref 26.0–34.0)
MCHC: 33.9 g/dL (ref 30.0–36.0)
MCV: 83.1 fL (ref 80.0–100.0)
Monocytes Absolute: 0.3 10*3/uL (ref 0.1–1.0)
Monocytes Relative: 12 %
Neutro Abs: 1.7 10*3/uL (ref 1.7–7.7)
Neutrophils Relative %: 64 %
Platelets: 188 10*3/uL (ref 150–400)
RBC: 2.95 MIL/uL — AB (ref 4.22–5.81)
RDW: 14.6 % (ref 11.5–15.5)
WBC: 2.7 10*3/uL — ABNORMAL LOW (ref 4.0–10.5)
nRBC: 0 % (ref 0.0–0.2)

## 2018-06-03 LAB — SAMPLE TO BLOOD BANK

## 2018-06-03 MED ORDER — SODIUM CHLORIDE 0.9 % IV SOLN
Freq: Once | INTRAVENOUS | Status: AC
  Administered 2018-06-03: 09:00:00 via INTRAVENOUS
  Filled 2018-06-03: qty 250

## 2018-06-03 MED ORDER — ONDANSETRON HCL 4 MG/2ML IJ SOLN
8.0000 mg | Freq: Once | INTRAMUSCULAR | Status: AC
  Administered 2018-06-03: 8 mg via INTRAVENOUS
  Filled 2018-06-03: qty 4

## 2018-06-03 MED ORDER — HYDROMORPHONE HCL 1 MG/ML IJ SOLN
0.5000 mg | Freq: Once | INTRAMUSCULAR | Status: AC
  Administered 2018-06-03: 0.5 mg via INTRAVENOUS
  Filled 2018-06-03: qty 0.5

## 2018-06-03 MED ORDER — OMEPRAZOLE 20 MG PO CPDR: 20.0000 mg | DELAYED_RELEASE_CAPSULE | Freq: Every day | ORAL | 0 refills | Status: DC

## 2018-06-03 MED ORDER — DEXAMETHASONE 4 MG PO TABS: 4.0000 mg | ORAL_TABLET | Freq: Every day | ORAL | 0 refills | Status: DC

## 2018-06-03 MED ORDER — FENTANYL 25 MCG/HR TD PT72: 25.0000 ug | MEDICATED_PATCH | TRANSDERMAL | 0 refills | Status: DC

## 2018-06-03 MED ORDER — SODIUM CHLORIDE 0.9 % IV SOLN: 8.0000 mg | Freq: Once | INTRAVENOUS | Status: DC

## 2018-06-03 NOTE — Progress Notes (Signed)
Patient here for follow up. Pt complains of increased shortness of breath that has gotten worse.

## 2018-06-03 NOTE — Progress Notes (Signed)
Hematology/Oncology  Follow up note [facility] [phone] [phone]  Patient Care Team: Maryland Pink, MD as PCP - General (Family Medicine) Earlie Server, MD as Medical Oncologist (Medical Oncology)  REASON FOR VISIT Follow up for antineoplasm treatment of Prostate Cancer  HISTORY OF PRESENTING ILLNESS:  Matthew Hunter 85 y.o.  male with PMH listed as below who was referred by [facility] Provider [name] to me for evaluation of clinical prostate cancer and management.  He has a history of elevated PSA and BPH. PSA trends as follows: 11/14/2014 4.3, 11/07/2015 7.1, 05/11/2016 10.4, 06/11/2016 10.6, 07/07/2016 7.8, 01/06/2017 86.8.  He was seen by urology and was diagnosed with clinical prostate cancer given the extreme elevation of PSA and prostate nodule. Biopsy was not pursued. He was treated with [name] on 01/29/2017,  # prostate biopsy on 03/10/2017. 5 out of 6 cores positive for prostate cancer, with highest gleason score group 9 (4+5), + perineural invasion.  Stage IV prostate cancer, multiple osseus metastatic disease. No visceral involvement.   01/18/2018  CT scan image was independently reviewed by me and discussed with patient.  Patient has progression of bone metastatic disease only  Current treatment  Patient got first Pierce on 01/29/2018, switched to leupron on 04/13/2015,  Q6 months.  .Testosterone level at 14, castration level.  Upfront Gillermina Phy was added on December 2019 to assist fast disease control (phase II trial [place] B, Lancet Oncol. 2014) S/p palliative RT to lumber spine and SI joints. Xtandi discontinued on due to CNS toxicity in April 2019.  12/09/17 Abiaterone discontinued.  7/ 18/2019 Start Apalutimide 223m daily.   Trend of tumor marker  PSA 86.8 -->64.78 -->131--> 242 added Xtanti --> 17.74-->1.82 Xtandi stopped due to CNS toxicity in April 2019. --> 1.79- (added Abiaterone)-> 11.06-->24.53--> 39.48  [Abiaterone stopped, Added Apalutimid]--> 65.71-->88.63--> Xofigo treatment--> 215--> Back on Xtandi reduced dose.   12/09/17 Abiaterone discontinued due to rising PSA and disease progression. 12/16/2017 Start Apalutimide 2450mdaily. Held on 01/13/2018 due to rising PSA, also causing patient to feel dizziness/black out.  Started Xofigo treatment on 02/23/2018. 02/28/2018, back on Xtandi reduced dose 12048mXtandi stopped Dec.2020 due to   05/20/2018 Weekly Docetaxol 2 weeks on one week off.   INTERVAL HISTORY Patient presents to follow up for management of metastatic prostate cancer and follow-up on his metastatic prostate cancer related symptoms and assessment prior to chemotherapy treatment for prostate cancer..  Marland Kitchen#Has received 4 treatments of Xofigo. S/p weekly Docetaxol x2,  Today he reports feeling very weak, having stomach discomfort, and diarrhea.  He had 5 watery bowel movement since last night 10 PM. Poor appetite.  Taking Megace 80 mg twice daily.  #Continue to have bone pain " all over".  On fentanyl patch 50 MCG every 72 hours and oxycodone increased to 10 mg every 6 hours as needed.  Patient is a poor historian and provides vague history.  He takes he is taking about no more than 5 oxycodone every day.  Reports "pain medication has done nothing".  Denies any cough, shortness of breath, chest congestion or abdominal pain. Also reports sores on his penis and rectal area.  He was seen by Dr. 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 He was also given triamcinolone 0.1% cream and patient was advised to apply topically 2 times daily. Mirtazapine 30 mg daily has been added to increase appetite.  ROS:  Review of Systems  Constitutional: Positive for  appetite change and fatigue. Negative for chills, fever and unexpected weight change.  HENT:   Negative for hearing loss and voice change.   Eyes: Negative for eye  problems and icterus.  Respiratory: Negative for chest tightness, cough and shortness of breath.   Cardiovascular: Negative for chest pain and leg swelling.  Gastrointestinal: Positive for diarrhea. Negative for abdominal distention and abdominal pain.  Endocrine: Negative for hot flashes.  Genitourinary: Negative for difficulty urinating, dysuria and frequency.        Penile lesion  Musculoskeletal: Positive for arthralgias and back pain.  Skin: Negative for itching and rash.  Neurological: Negative for light-headedness and numbness.  Hematological: Negative for adenopathy. Does not bruise/bleed easily.  Psychiatric/Behavioral: Negative for confusion.    MEDICAL HISTORY:  Past Medical History:  Diagnosis Date  . Arthritis   . Benign enlargement of prostate   . CAD (coronary artery disease)   . Constipation   . Elevated PSA   . GERD (gastroesophageal reflux disease)   . Hypertension   . Incomplete bladder emptying   . Nocturia   . Prostate cancer ([place]) 05/03/2017   Rad tx's and Lupron shots.   . Urgency of micturation   . Urinary frequency     SURGICAL HISTORY: Past Surgical History:  Procedure Laterality Date  . GALLBLADDER SURGERY    . PROSTATE SURGERY    . STOMACH SURGERY      SOCIAL HISTORY: Social History   Socioeconomic History  . Marital status: Married    Spouse name: Not on file  . Number of children: Not on file  . Years of education: Not on file  . Highest education level: Not on file  Occupational History  . Not on file  Social Needs  . Financial resource strain: Not on file  . Food insecurity:    Worry: Not on file    Inability: Not on file  . Transportation needs:    Medical: Not on file    Non-medical: Not on file  Tobacco Use  . Smoking status: Former Smoker    Last attempt to quit: 06/01/1998    Years since quitting: 20.0  . Smokeless tobacco: Never Used  Substance and Sexual Activity  . Alcohol use: No    Alcohol/week: 0.0 standard  drinks  . Drug use: Not Currently    Frequency: 2.0 times per week    Types: Marijuana    Comment: marijuana two time monthly gives him an appetite  . Sexual activity: Not on file  Lifestyle  . Physical activity:    Days per week: Not on file    Minutes per session: Not on file  . Stress: Not on file  Relationships  . Social connections:    Talks on phone: Not on file    Gets together: Not on file    Attends religious service: Not on file    Active member of club or organization: Not on file    Attends meetings of clubs or organizations: Not on file    Relationship status: Not on file  . Intimate partner violence:    Fear of current or ex partner: Not on file    Emotionally abused: Not on file    Physically abused: Not on file    Forced sexual activity: Not on file  Other Topics Concern  . Not on file  Social History Narrative  . Not on file    FAMILY HISTORY: Family History  Problem Relation Age of Onset  . Hypertension  Mother   . Stroke Father   . Kidney disease Neg Hx   . Prostate cancer Neg Hx   . Kidney cancer Neg Hx   . Bladder Cancer Neg Hx     ALLERGIES:  has No Known Allergies.  MEDICATIONS:  Current Outpatient Medications  Medication Sig Dispense Refill  . aspirin EC 81 MG tablet Take by mouth.    Marland Kitchen atenolol (TENORMIN) 100 MG tablet Take 100 mg by mouth daily.     . Calcium Carb-Cholecalciferol (CALCIUM-VITAMIN D) 500-200 MG-UNIT tablet Take 1 tablet by mouth daily. 90 tablet 0  . fentaNYL (DURAGESIC - DOSED MCG/HR) 50 MCG/HR Place 1 patch (50 mcg total) onto the skin every 3 (three) days. 10 patch 0  . finasteride (PROSCAR) 5 MG tablet Take 1 tablet (5 mg total) by mouth daily. 90 tablet 3  . hydrocortisone 2.5 % cream APP EXT AA BID  0  . megestrol (MEGACE) 40 MG tablet Take 2 tablets (80 mg total) by mouth 2 (two) times daily. 120 tablet 0  . mirabegron ER (MYRBETRIQ) 25 MG TB24 tablet Take 1 tablet (25 mg total) by mouth daily. 90 tablet 3  .  mirtazapine (REMERON) 15 MG tablet Take 15 mg by mouth at bedtime.  11  . nortriptyline (PAMELOR) 25 MG capsule Take 25 mg by mouth at bedtime. Reported on 05/16/2015  1  . omeprazole (PRILOSEC) 20 MG capsule Take 20 mg by mouth daily.    . ondansetron (ZOFRAN) 8 MG tablet Take 1 tablet (8 mg total) by mouth 2 (two) times daily as needed for refractory nausea / vomiting. 30 tablet 1  . Oxycodone HCl 10 MG TABS Take 1 tablet (10 mg total) by mouth every 6 (six) hours as needed. 60 tablet 0  . polyethylene glycol (MIRALAX / GLYCOLAX) packet Take 17 g by mouth daily. Mix one tablespoon with 8oz of your favorite juice or water every day until you are having soft formed stools. Then start taking once daily if you didn't have a stool the day before. 30 each 0  . prochlorperazine (COMPAZINE) 10 MG tablet Take 1 tablet (10 mg total) by mouth every 6 (six) hours as needed (Nausea or vomiting). 30 tablet 1  . pyridOXINE (VITAMIN B-6) 50 MG tablet Take 50 mg by mouth daily.     Marland Kitchen senna-docusate (SENOKOT-S) 8.6-50 MG tablet Take 2 tablets by mouth daily. 60 tablet 3  . tamsulosin (FLOMAX) 0.4 MG CAPS capsule Take 1 capsule (0.4 mg total) by mouth daily. 90 capsule 3  . Telmisartan-Amlodipine 80-5 MG TABS Take 1 tablet by mouth daily.      No current facility-administered medications for this visit.       Marland Kitchen  PHYSICAL EXAMINATION: ECOG PERFORMANCE STATUS: 1 - Symptomatic but completely ambulatory Vitals:   06/03/18 0837  BP: (!) 102/58  Pulse: 81  Resp: 20  Temp: (!) 96.2 F (35.7 C)  SpO2: 99%   Filed Weights   06/03/18 0837  Weight: 172 lb 4.8 oz (78.2 kg)   Physical Exam Constitutional:      General: He is not in acute distress.    Appearance: He is not diaphoretic.  HENT:     Head: Normocephalic and atraumatic.     Mouth/Throat:     Pharynx: No oropharyngeal exudate.  Eyes:     General: No scleral icterus.    Conjunctiva/sclera: Conjunctivae normal.     Pupils: Pupils are equal,  round, and reactive to light.  Neck:  Musculoskeletal: Normal range of motion and neck supple.     Thyroid: No thyromegaly.  Cardiovascular:     Rate and Rhythm: Normal rate and regular rhythm.     Heart sounds: Normal heart sounds. No murmur.  Pulmonary:     Effort: Pulmonary effort is normal. No respiratory distress.     Breath sounds: Normal breath sounds. No wheezing.  Abdominal:     General: Bowel sounds are normal. There is no distension.     Palpations: Abdomen is soft. There is no mass.     Tenderness: There is no abdominal tenderness.  Genitourinary:    Comments:  lesions very close to the distal penis shaft and corona Musculoskeletal: Normal range of motion.        General: No deformity.  Skin:    General: Skin is warm and dry.     Findings: No erythema or rash.  Neurological:     Mental Status: He is alert and oriented to person, place, and time.     Cranial Nerves: No cranial nerve deficit.     Coordination: Coordination normal.  Psychiatric:        Behavior: Behavior normal.        Thought Content: Thought content normal.    LABORATORY DATA:  I have reviewed the data as listed Lab Results  Component Value Date   WBC 2.7 (L) 06/03/2018   HGB 8.3 (L) 06/03/2018   HCT 24.5 (L) 06/03/2018   MCV 83.1 06/03/2018   PLT 188 06/03/2018   Recent Labs    05/12/18 1107 05/20/18 0854 05/27/18 1235  NA 134* 140 136  K 4.2 3.7 4.5  CL 104 109 105  CO2 _0 GLUCOSE 119* 168* 127*  BUN 13 11 25*  CREATININE 0.99 0.97 1.06  CALCIUM 9.7 9.5 9.5  GFRNONAA >60 >60 >60  GFRAA >60 >60 >60  PROT 7.5 7.5 7.4  ALBUMIN 3.8 3.8 3.6  AST 31 33 33  ALT _1 ALKPHOS 39 38 34*  BILITOT 0.7 0.8 0.5   RADIOGRAPHIC STUDIES: I have personally reviewed the radiological images as listed and agreed with the findings in the report. 12/15/2017 Bone scan whole body showed new sites of osseous tracer accumulation are identified especially to size at the distal left  femoral diaphysis, L2 vertebral body, right iliac bone, right ischium. 01/18/2018 chest abdomen pelvis with contrast Extensive metastasis disease to bone, with all bone lesions new from the abdomen and pelvis CT dated 9/5 2018 and a chest CT dated 06/19/2013.  Involvement is similar to the recent bone scan.  No other evidence of metastatic disease.  No acute abnormalities in the chest abdomen and pelvis.  03/14/2018 CT chest Angio PE protocol 1. No acute cardiopulmonary abnormalities. No evidence for acute pulmonary embolus 2. Coronary artery atherosclerotic calcifications 3.  Aortic Atherosclerosis (ICD10-I70.0). 4. Sclerotic bone metastasis as noted previously  03/25/2018 CT abdomen pelvis with contrast  1. No acute abnormality in the abdomen or pelvis. 2. Extensive metastatic bone disease. Findings are similar to theexam on 01/18/2018.  3. Nonobstructive left kidney stone. 4. Stable left adrenal nodule.  ASSESSMENT & PLAN: 85 yo male follows up for management of metastatic prostate cancer and neoplasm related symptoms..  1. Prostate cancer ([place])   2. Diarrhea, unspecified type   3. Neoplasm related pain   4. Bone metastasis ([place])   5. 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  Status post 4 out of 6 planned [facility] treatment.  Alkaline phosphatase remains normal.  LDH mildly elevated. PSA continue to rise. Status post weekly docetaxel x2 doses.  Last dose was given last week. Hold additional chemotherapy at this point due to fatigue, and diarrhea. Supportive care [see below].   #Diarrhea and dehydration.  Check C. Diff toxin, if negative will start him on Imodium He is going to receive IV normal saline 1 L x 1.  And be reevaluated.  #Stomach discomfort/dry heave, Zofran 8 mg IV x1 #Intractable bone pain Will receive Dilaudid 0.5 mg x 1 and see how he does after IV fluid and pain  medication. Start dexamethasone 4 mg daily to ease his pain.  I reevaluate patient after patient finished 1 L of normal saline, received IV Dilaudid and Zofran. Patient reports feeling much better. I went over detailed instructions of the change of pain regimen: Increase fentanyl patch to 75 MCG every 72 hours.  He has 50 MCG patches and I sent prescription of 25 MCG patches to be applied along with 50 MCG patches. Start dexamethasone 4 mg daily Omeprazole 20 mg daily Patient is not able to give stool sample.  He took containers home. If C. difficile is negative, will start patient on Imodium.   # Somatic BRCA1 mutation positive. Has been referred to genetic counseling. PARP inhibitors  can be considered as the next line of treatment. # Bone metastatic disease, Delton See was given on 05/27/2018.  Continue previously scheduled Xofigo treatments.  Last Xofigo treatment was on 05/18/2018. Most recent bone scan was independent reviewed by me and discussed with patient. 05/30/2018 bone scan showed minimal change in the extensive skeletal metastasis since the study of 12/15/2017. Single new metastasis in the anterior aspect of the right eighth rib. Very slight increased activity in the metastasis in the thoracic spine, left acetabulum and the proximal left femur. Patient has appointment to see Dr. Donella Stade to see if candidate of additional palliative radiation.  Return of visit: Patient will need to follow-up in 3 days in the clinic for hydration session with 1 L of normal saline given in 1 hour.   I will see patient in 1 week for evaluation prior to next docetaxel treatment  Total face to face encounter time for this patient visit was 25 min. >50% of the time was  spent in counseling and coordination of care.   Earlie Server, MD, PhD Hematology Oncology [facility] Pager- [phone] 06/03/2018

## 2018-06-06 ENCOUNTER — Inpatient Hospital Stay: Payer: Medicare Other

## 2018-06-06 ENCOUNTER — Other Ambulatory Visit: Payer: Self-pay | Admitting: Oncology

## 2018-06-06 ENCOUNTER — Other Ambulatory Visit (INDEPENDENT_AMBULATORY_CARE_PROVIDER_SITE_OTHER): Payer: Self-pay | Admitting: Nurse Practitioner

## 2018-06-06 ENCOUNTER — Other Ambulatory Visit: Payer: Self-pay

## 2018-06-06 ENCOUNTER — Ambulatory Visit: Payer: Medicare Other

## 2018-06-06 VITALS — BP 122/61 | HR 76 | Temp 95.4°F | Resp 18

## 2018-06-06 DIAGNOSIS — C61 Malignant neoplasm of prostate: Secondary | ICD-10-CM | POA: Diagnosis not present

## 2018-06-06 DIAGNOSIS — R197 Diarrhea, unspecified: Secondary | ICD-10-CM

## 2018-06-06 LAB — C DIFFICILE QUICK SCREEN W PCR REFLEX
C Diff antigen: POSITIVE — AB
C Diff toxin: NEGATIVE

## 2018-06-06 LAB — CLOSTRIDIUM DIFFICILE BY PCR, REFLEXED: CDIFFPCR: POSITIVE — AB

## 2018-06-06 MED ORDER — VANCOMYCIN HCL 125 MG PO CAPS: 125.0000 mg | ORAL_CAPSULE | Freq: Four times a day (QID) | ORAL | 0 refills | Status: DC

## 2018-06-06 MED ORDER — SODIUM CHLORIDE 0.9 % IV SOLN
Freq: Once | INTRAVENOUS | Status: AC
  Administered 2018-06-06: 15:00:00 via INTRAVENOUS
  Filled 2018-06-06: qty 250

## 2018-06-07 ENCOUNTER — Ambulatory Visit
Admission: RE | Admit: 2018-06-07 | Discharge: 2018-06-07 | Disposition: A | Discharge status: Home / Self Care | Payer: Medicare Other | Attending: Vascular Surgery | Admitting: Vascular Surgery

## 2018-06-07 ENCOUNTER — Encounter
Admission: RE | Disposition: A | Discharge status: Home / Self Care | Payer: Self-pay | Source: Home / Self Care | Attending: Vascular Surgery

## 2018-06-07 ENCOUNTER — Other Ambulatory Visit: Payer: Self-pay

## 2018-06-07 DIAGNOSIS — Z8249 Family history of ischemic heart disease and other diseases of the circulatory system: Secondary | ICD-10-CM | POA: Diagnosis not present

## 2018-06-07 DIAGNOSIS — Z823 Family history of stroke: Secondary | ICD-10-CM | POA: Insufficient documentation

## 2018-06-07 DIAGNOSIS — K219 Gastro-esophageal reflux disease without esophagitis: Secondary | ICD-10-CM | POA: Diagnosis not present

## 2018-06-07 DIAGNOSIS — C799 Secondary malignant neoplasm of unspecified site: Secondary | ICD-10-CM | POA: Diagnosis not present

## 2018-06-07 DIAGNOSIS — R3915 Urgency of urination: Secondary | ICD-10-CM | POA: Insufficient documentation

## 2018-06-07 DIAGNOSIS — R350 Frequency of micturition: Secondary | ICD-10-CM | POA: Diagnosis not present

## 2018-06-07 DIAGNOSIS — M1990 Unspecified osteoarthritis, unspecified site: Secondary | ICD-10-CM | POA: Diagnosis not present

## 2018-06-07 DIAGNOSIS — C61 Malignant neoplasm of prostate: Secondary | ICD-10-CM | POA: Insufficient documentation

## 2018-06-07 DIAGNOSIS — Z87891 Personal history of nicotine dependence: Secondary | ICD-10-CM | POA: Insufficient documentation

## 2018-06-07 DIAGNOSIS — I2510 Atherosclerotic heart disease of native coronary artery without angina pectoris: Secondary | ICD-10-CM | POA: Diagnosis not present

## 2018-06-07 DIAGNOSIS — R530 Neoplastic (malignant) related fatigue: Secondary | ICD-10-CM | POA: Diagnosis not present

## 2018-06-07 DIAGNOSIS — G893 Neoplasm related pain (acute) (chronic): Secondary | ICD-10-CM | POA: Diagnosis not present

## 2018-06-07 DIAGNOSIS — Z7982 Long term (current) use of aspirin: Secondary | ICD-10-CM | POA: Diagnosis not present

## 2018-06-07 DIAGNOSIS — N401 Enlarged prostate with lower urinary tract symptoms: Secondary | ICD-10-CM | POA: Diagnosis not present

## 2018-06-07 DIAGNOSIS — I10 Essential (primary) hypertension: Secondary | ICD-10-CM | POA: Diagnosis not present

## 2018-06-07 DIAGNOSIS — C7951 Secondary malignant neoplasm of bone: Secondary | ICD-10-CM | POA: Diagnosis not present

## 2018-06-07 DIAGNOSIS — Z79899 Other long term (current) drug therapy: Secondary | ICD-10-CM | POA: Diagnosis not present

## 2018-06-07 HISTORY — PX: PORTA CATH INSERTION: CATH118285

## 2018-06-07 SURGERY — PORTA CATH INSERTION
Anesthesia: Moderate Sedation

## 2018-06-07 MED ORDER — CEFAZOLIN SODIUM-DEXTROSE 2-4 GM/100ML-% IV SOLN
2.0000 g | Freq: Once | INTRAVENOUS | Status: AC
  Administered 2018-06-07: 2 g via INTRAVENOUS

## 2018-06-07 MED ORDER — LIDOCAINE-EPINEPHRINE (PF) 1 %-1:200000 IJ SOLN
INTRAMUSCULAR | Status: AC
  Filled 2018-06-07: qty 10

## 2018-06-07 MED ORDER — MIDAZOLAM HCL 5 MG/5ML IJ SOLN
INTRAMUSCULAR | Status: AC
  Filled 2018-06-07: qty 5

## 2018-06-07 MED ORDER — ONDANSETRON HCL 4 MG/2ML IJ SOLN: 4.0000 mg | Freq: Four times a day (QID) | INTRAMUSCULAR | Status: DC | PRN

## 2018-06-07 MED ORDER — FENTANYL CITRATE (PF) 100 MCG/2ML IJ SOLN
INTRAMUSCULAR | Status: AC
  Filled 2018-06-07: qty 2

## 2018-06-07 MED ORDER — MIDAZOLAM HCL 2 MG/2ML IJ SOLN
INTRAMUSCULAR | Status: DC | PRN
  Administered 2018-06-07: 2 mg via INTRAVENOUS

## 2018-06-07 MED ORDER — SODIUM CHLORIDE 0.9 % IV SOLN
INTRAVENOUS | Status: DC
  Administered 2018-06-07: 11:00:00 via INTRAVENOUS

## 2018-06-07 MED ORDER — LIDOCAINE-EPINEPHRINE (PF) 1 %-1:200000 IJ SOLN
INTRAMUSCULAR | Status: AC
  Filled 2018-06-07: qty 20

## 2018-06-07 MED ORDER — FENTANYL CITRATE (PF) 100 MCG/2ML IJ SOLN
INTRAMUSCULAR | Status: DC | PRN
  Administered 2018-06-07: 50 ug via INTRAVENOUS

## 2018-06-07 MED ORDER — DEXTROSE 5 % IV SOLN: 2.0000 g | Freq: Once | INTRAVENOUS | Status: DC

## 2018-06-07 MED ORDER — HYDROMORPHONE HCL 1 MG/ML IJ SOLN: 1.0000 mg | Freq: Once | INTRAMUSCULAR | Status: DC | PRN

## 2018-06-07 SURGICAL SUPPLY — 15 items
DERMABOND ADVANCED (GAUZE/BANDAGES/DRESSINGS) ×2
DERMABOND ADVANCED .7 DNX12 (GAUZE/BANDAGES/DRESSINGS) IMPLANT
DEVICE TORQUE .025-.038 (MISCELLANEOUS) ×2 IMPLANT
DRAPE INCISE IOBAN 66X45 STRL (DRAPES) ×3 IMPLANT
GLIDEWIRE STIFF .35X180X3 HYDR (WIRE) ×2 IMPLANT
GUIDEWIRE ANGLED .035 180CM (WIRE) ×2 IMPLANT
KIT PORT POWER 8FR ISP CVUE (Port) ×2 IMPLANT
NDL ENTRY 21GA 7CM ECHOTIP (NEEDLE) IMPLANT
NEEDLE ENTRY 21GA 7CM ECHOTIP (NEEDLE) ×3 IMPLANT
PACK ANGIOGRAPHY (CUSTOM PROCEDURE TRAY) ×3 IMPLANT
SET INTRO CAPELLA COAXIAL (SET/KITS/TRAYS/PACK) ×2 IMPLANT
SUT MNCRL AB 4-0 PS2 18 (SUTURE) ×3 IMPLANT
SUT PROLENE 0 CT 1 30 (SUTURE) ×3 IMPLANT
SUT VIC AB 3-0 SH 27 (SUTURE) ×2
SUT VIC AB 3-0 SH 27X BRD (SUTURE) IMPLANT

## 2018-06-07 NOTE — Op Note (Signed)
OPERATIVE NOTE   PROCEDURE: 1. Placement of a right IJ Infuse-a-Port  PRE-OPERATIVE DIAGNOSIS: Metastatic prostate carcinoma  POST-OPERATIVE DIAGNOSIS: Same  SURGEON: Katha Cabal M.D.  ANESTHESIA: Conscious sedation was administered under my direct supervision by the interventional radiology RN. IV Versed plus fentanyl were utilized. Continuous ECG, pulse oximetry and blood pressure was monitored throughout the entire procedure. Conscious sedation was for a total of 36 minutes.  ESTIMATED BLOOD LOSS: Minimal   FINDING(S): 1.  Patent vein  SPECIMEN(S): None  INDICATIONS:   Matthew Hunter is a 85 y.o. male who presents with metastatic prostate carcinoma.  He will require parenteral chemotherapy and therefore appropriate IV access.  Infuse-a-Port is been discussed gust with the patient.  He wishes to proceed as he wishes to continue chemotherapy.  Risks and benefits of been reviewed and patient is agreed to move forward with Infuse-a-Port placement.  DESCRIPTION: After obtaining full informed written consent, the patient was brought back to the special procedure suite and placed in the supine position. The patient's right neck and chest wall are prepped and draped in sterile fashion. Appropriate timeout was called.  Ultrasound is placed in a sterile sleeve, ultrasound is utilized to avoid vascular injury as well as secondary to lack of appropriate landmarks. The right internal jugular vein is identified. It is echolucent and homogeneous as well as easily compressible indicating patency. An image is recorded for the permanent record.  Access to the vein with a micropuncture needle is done under direct ultrasound visualization.  1% lidocaine is infiltrated into the soft tissue at the base of the neck as well as on the chest wall.  Under direct ultrasound visualization a micro-needle is inserted into the vein followed by the micro-wire. Micro-sheath was then advanced and a J wire is  inserted without difficulty under fluoroscopic guidance. A small counterincision was created at the wire insertion site. A transverse incision is created 2 fingerbreadths below the scapula and a pocket is fashioned using both blunt and sharp dissection. The pocket is tested for appropriate size with the hub of the Infuse-a-Port. The tunneling device is then used to pull the intravascular portion of the catheter from the pocket to the neck counterincision.  Dilator and peel-away sheath were then inserted over the wire and the wire is removed. Catheter is then advanced into the venous system without difficulty. Peel-away sheath was then removed.  Catheter is then positioned under fluoroscopic guidance at the atrial caval junction. It is then transected connected to the hub and the hope is slipped into the subcutaneous pocket on the chest wall. The hub was then accessed percutaneously and aspirates easily and flushes well and is flushed with 30 cc of heparinized saline. The pocket incision is then closed in layers using interrupted 3-0 Vicryl for the subcutaneous tissues and 4-0 Monocryl subcuticular for skin closure. Dermabond is applied. The neck counterincision was closed with 4-0 Monocryl subcuticular and Dermabond as well.  The patient tolerated the procedure well and there were no immediate complications.  COMPLICATIONS: None  CONDITION: Unchanged  Katha Cabal M.D. [facility] Office: [phone]   06/07/2018, 3:16 PM

## 2018-06-07 NOTE — H&P (Signed)
[facility] History & Physical Update  The patient was interviewed and re-examined.  The patient's previous History and Physical has been reviewed and is unchanged.  The patient has metastatic prostate carcinoma and will undergo chemotherapy.  He therefore requires appropriate intravenous access.  Infuse-a-Port was recommended and discussed.  He has concurred.  There is no change in the plan of care. We plan to proceed with the scheduled procedure.  Hortencia Pilar, MD  06/07/2018, 1:16 PM

## 2018-06-08 ENCOUNTER — Ambulatory Visit
Admission: RE | Admit: 2018-06-08 | Discharge: 2018-06-08 | Disposition: A | Discharge status: Home / Self Care | Payer: Medicare Other | Source: Ambulatory Visit | Attending: Radiation Oncology | Admitting: Radiation Oncology

## 2018-06-08 ENCOUNTER — Encounter: Payer: Self-pay | Admitting: Radiation Oncology

## 2018-06-08 ENCOUNTER — Other Ambulatory Visit: Payer: Self-pay

## 2018-06-08 VITALS — BP 133/71 | HR 99 | Temp 95.8°F | Resp 22 | Wt 176.6 lb

## 2018-06-08 DIAGNOSIS — C61 Malignant neoplasm of prostate: Secondary | ICD-10-CM | POA: Diagnosis not present

## 2018-06-08 DIAGNOSIS — Z192 Hormone resistant malignancy status: Secondary | ICD-10-CM | POA: Insufficient documentation

## 2018-06-08 DIAGNOSIS — C7951 Secondary malignant neoplasm of bone: Secondary | ICD-10-CM | POA: Diagnosis present

## 2018-06-08 DIAGNOSIS — M7989 Other specified soft tissue disorders: Secondary | ICD-10-CM | POA: Insufficient documentation

## 2018-06-08 NOTE — Progress Notes (Signed)
Radiation Oncology Follow up Note  Name: Matthew Hunter   Date:   06/08/2018 MRN:  [id number] DOB: 02-10-1930    This 85 y.o. male presents to the clinic today for valuation of rib metastasis in patient currently undergoingXofigo treatment for castrate resistantstage IV prostate cancer.  REFERRING PROVIDER: Maryland Pink, MD  HPI: patient is a 85 year old male currently undergoingXofigo infusion on a monthly basis for stage IV castrate resistant prostate cancer. He complains of significant left rib pain.and has on CT scans evidence of bone metastasisin left anterior ribs. His bone scan recently performedat the end of last month showed minimal change in extensive skeletal metastasis since prior study in July 2019. He also complains of some slight leg weakness although is ambulating well.he has been tolerating hisXofigo infusions well.  COMPLICATIONS OF TREATMENT: none  FOLLOW UP COMPLIANCE: keeps appointments   PHYSICAL EXAM:  BP 133/71 (BP Location: Left Arm, Patient Position: Sitting)   Pulse 99   Temp (!) 95.8 F (35.4 C) (Tympanic)   Resp (!) 22   Wt 176 lb 9.4 oz (80.1 kg)   BMI 26.08 kg/m  There is point tenderness present in the left anterior ribs Carr spiked areas of involvement by CT criteria. Range of motion of his upper lower extremity does not elicit pain deep palpation of the spine does not elicit pain.Well-developed well-nourished patient in NAD. HEENT reveals PERLA, EOMI, discs not visualized.  Oral cavity is clear. No oral mucosal lesions are identified. Neck is clear without evidence of cervical or supraclavicular adenopathy. Lungs are clear to A&P. Cardiac examination is essentially unremarkable with regular rate and rhythm without murmur rub or thrill. Abdomen is benign with no organomegaly or masses noted. Motor sensory and DTR levels are equal and symmetric in the upper and lower extremities. Cranial nerves II through XII are grossly intact. Proprioception is intact. No  peripheral adenopathy or edema is identified. No motor or sensory levels are noted. Crude visual fields are within normal range. RADIOLOGY RESULTS: CT scans and bone scan reviewed and compatible with the above-stated findings  PLAN: at this time I have recommend a palliative radiation therapy to his left ribs. Would plan on delivering 2000 cGy in 5 fractions. I have set up treatment planning for later this week. Risks and benefits of treatment including skin reaction fatigue possible inclusion of superficial lung all were discussed in detail with the patient. He has wife both comprehend my treatment plan well.  I would like to take this opportunity to thank you for allowing me to participate in the care of your patient.Noreene Filbert, MD

## 2018-06-09 ENCOUNTER — Ambulatory Visit
Admission: RE | Admit: 2018-06-09 | Discharge: 2018-06-09 | Disposition: A | Discharge status: Home / Self Care | Payer: Medicare Other | Source: Ambulatory Visit | Attending: Radiation Oncology | Admitting: Radiation Oncology

## 2018-06-09 ENCOUNTER — Encounter: Payer: Self-pay | Admitting: *Deleted

## 2018-06-09 ENCOUNTER — Inpatient Hospital Stay: Payer: Medicare Other

## 2018-06-09 ENCOUNTER — Other Ambulatory Visit: Payer: Self-pay

## 2018-06-09 DIAGNOSIS — C61 Malignant neoplasm of prostate: Secondary | ICD-10-CM

## 2018-06-09 DIAGNOSIS — Z510 Encounter for antineoplastic radiation therapy: Secondary | ICD-10-CM | POA: Insufficient documentation

## 2018-06-09 DIAGNOSIS — C7951 Secondary malignant neoplasm of bone: Secondary | ICD-10-CM | POA: Diagnosis not present

## 2018-06-09 LAB — COMPREHENSIVE METABOLIC PANEL
ALT: 15 U/L (ref 0–44)
AST: 31 U/L (ref 15–41)
Albumin: 3.8 g/dL (ref 3.5–5.0)
Alkaline Phosphatase: 36 U/L — ABNORMAL LOW (ref 38–126)
Anion gap: 9 (ref 5–15)
BUN: 10 mg/dL (ref 8–23)
CHLORIDE: 109 mmol/L (ref 98–111)
CO2: 21 mmol/L — ABNORMAL LOW (ref 22–32)
Calcium: 8.7 mg/dL — ABNORMAL LOW (ref 8.9–10.3)
Creatinine, Ser: 1 mg/dL (ref 0.61–1.24)
GFR calc Af Amer: >60 mL/min (ref >60)
GFR calc non Af Amer: >60 mL/min (ref >60)
Glucose, Bld: 107 mg/dL — ABNORMAL HIGH (ref 70–99)
Potassium: 3.8 mmol/L (ref 3.5–5.1)
Sodium: 139 mmol/L (ref 135–145)
Total Bilirubin: 0.9 mg/dL (ref 0.3–1.2)
Total Protein: 7.3 g/dL (ref 6.5–8.1)

## 2018-06-09 LAB — CBC WITH DIFFERENTIAL/PLATELET
Abs Immature Granulocytes: 0.05 10*3/uL (ref 0.00–0.07)
BASOS ABS: 0 10*3/uL (ref 0.0–0.1)
Basophils Relative: 0 %
Eosinophils Absolute: 0 10*3/uL (ref 0.0–0.5)
Eosinophils Relative: 0 %
HCT: 25.5 % — ABNORMAL LOW (ref 39.0–52.0)
Hemoglobin: 8.5 g/dL — ABNORMAL LOW (ref 13.0–17.0)
Immature Granulocytes: 1 %
Lymphocytes Relative: 17 %
Lymphs Abs: 0.9 10*3/uL (ref 0.7–4.0)
MCH: 28.1 pg (ref 26.0–34.0)
MCHC: 33.3 g/dL (ref 30.0–36.0)
MCV: 84.4 fL (ref 80.0–100.0)
Monocytes Absolute: 1.1 10*3/uL — ABNORMAL HIGH (ref 0.1–1.0)
Monocytes Relative: 22 %
NEUTROS ABS: 3.2 10*3/uL (ref 1.7–7.7)
Neutrophils Relative %: 60 %
Platelets: 203 10*3/uL (ref 150–400)
RBC: 3.02 MIL/uL — ABNORMAL LOW (ref 4.22–5.81)
RDW: 15.8 % — ABNORMAL HIGH (ref 11.5–15.5)
WBC: 5.3 10*3/uL (ref 4.0–10.5)
nRBC: 0 % (ref 0.0–0.2)

## 2018-06-10 ENCOUNTER — Inpatient Hospital Stay: Payer: Medicare Other

## 2018-06-10 ENCOUNTER — Other Ambulatory Visit: Payer: Self-pay

## 2018-06-10 ENCOUNTER — Encounter: Payer: Self-pay | Admitting: Oncology

## 2018-06-10 ENCOUNTER — Inpatient Hospital Stay (HOSPITAL_BASED_OUTPATIENT_CLINIC_OR_DEPARTMENT_OTHER): Payer: Medicare Other | Admitting: Oncology

## 2018-06-10 VITALS — BP 106/58 | HR 81 | Temp 96.2°F | Resp 18 | Wt 174.5 lb

## 2018-06-10 DIAGNOSIS — I10 Essential (primary) hypertension: Secondary | ICD-10-CM

## 2018-06-10 DIAGNOSIS — E860 Dehydration: Secondary | ICD-10-CM | POA: Diagnosis not present

## 2018-06-10 DIAGNOSIS — K219 Gastro-esophageal reflux disease without esophagitis: Secondary | ICD-10-CM

## 2018-06-10 DIAGNOSIS — N401 Enlarged prostate with lower urinary tract symptoms: Secondary | ICD-10-CM

## 2018-06-10 DIAGNOSIS — R630 Anorexia: Secondary | ICD-10-CM | POA: Diagnosis not present

## 2018-06-10 DIAGNOSIS — R338 Other retention of urine: Secondary | ICD-10-CM

## 2018-06-10 DIAGNOSIS — Z7982 Long term (current) use of aspirin: Secondary | ICD-10-CM

## 2018-06-10 DIAGNOSIS — C7951 Secondary malignant neoplasm of bone: Secondary | ICD-10-CM

## 2018-06-10 DIAGNOSIS — Z87891 Personal history of nicotine dependence: Secondary | ICD-10-CM

## 2018-06-10 DIAGNOSIS — Z510 Encounter for antineoplastic radiation therapy: Secondary | ICD-10-CM | POA: Diagnosis not present

## 2018-06-10 DIAGNOSIS — R530 Neoplastic (malignant) related fatigue: Secondary | ICD-10-CM

## 2018-06-10 DIAGNOSIS — G893 Neoplasm related pain (acute) (chronic): Secondary | ICD-10-CM

## 2018-06-10 DIAGNOSIS — C61 Malignant neoplasm of prostate: Secondary | ICD-10-CM

## 2018-06-10 DIAGNOSIS — Z79899 Other long term (current) drug therapy: Secondary | ICD-10-CM

## 2018-06-10 DIAGNOSIS — M1990 Unspecified osteoarthritis, unspecified site: Secondary | ICD-10-CM

## 2018-06-10 DIAGNOSIS — M549 Dorsalgia, unspecified: Secondary | ICD-10-CM

## 2018-06-10 DIAGNOSIS — R197 Diarrhea, unspecified: Secondary | ICD-10-CM

## 2018-06-10 DIAGNOSIS — I2510 Atherosclerotic heart disease of native coronary artery without angina pectoris: Secondary | ICD-10-CM

## 2018-06-10 NOTE — Progress Notes (Signed)
Patient here for follow up

## 2018-06-10 NOTE — Progress Notes (Signed)
Hematology/Oncology  Follow up note [facility] [phone] [phone]  Patient Care Team: [place] Pink, MD as PCP - General (Family Medicine) Earlie Server, MD as Medical Oncologist (Medical Oncology)  REASON FOR VISIT Follow up for antineoplasm treatment of Prostate Cancer  HISTORY OF PRESENTING ILLNESS:  Matthew Hunter 85 y.o.  male with PMH listed as below who was referred by [facility] Urology Provider [name] to me for evaluation of clinical prostate cancer and management.  He has a history of elevated PSA and BPH. PSA trends as follows: 11/14/2014 4.3, 11/07/2015 7.1, 05/11/2016 10.4, 06/11/2016 10.6, 07/07/2016 7.8, 01/06/2017 86.8.  He was seen by urology and was diagnosed with clinical prostate cancer given the extreme elevation of PSA and prostate nodule. Biopsy was not pursued. He was treated with [name] on 01/29/2017,  # prostate biopsy on 03/10/2017. 5 out of 6 cores positive for prostate cancer, with highest gleason score group 9 (4+5), + perineural invasion.  Stage IV prostate cancer, multiple osseus metastatic disease. No visceral involvement.   01/18/2018  CT scan image was independently reviewed by me and discussed with patient.  Patient has progression of bone metastatic disease only  Current treatment  Patient got first [facility] on 01/29/2018, switched to leupron on 04/13/2015,  Q6 months.  .Testosterone level at 14, castration level.  Upfront Gillermina Phy was added on December 2019 to assist fast disease control (phase II trial [place] B, [facility] Oncol. 2014) S/p palliative RT to lumber spine and SI joints. Xtandi discontinued on due to CNS toxicity in April 2019.  12/09/17 Abiaterone discontinued.  7/ 18/2019 Start Apalutimide '240mg'$  daily.   Trend of tumor marker  PSA 86.8 -->64.78 -->131--> 242 added Xtanti --> 17.74-->1.82 Xtandi stopped due to CNS toxicity in April 2019. --> 1.79- (added Abiaterone)-> 11.06-->24.53--> 39.48  [Abiaterone stopped, Added Apalutimid]--> 65.71-->88.63--> Xofigo treatment--> 215--> Back on Xtandi reduced dose.   12/09/17 Abiaterone discontinued due to rising PSA and disease progression. 12/16/2017 Start Apalutimide '240mg'$  daily. Held on 01/13/2018 due to rising PSA, also causing patient to feel dizziness/black out.  Started Xofigo treatment on 02/23/2018. 02/28/2018, back on Xtandi reduced dose '120mg'$ . Xtandi stopped Dec.2020 due to   05/20/2018 Weekly Docetaxol 2 weeks on one week off.   INTERVAL HISTORY Patient presents to follow up for management of metastatic prostate cancer and follow-up on his metastatic prostate cancer related symptoms and assessment prior to chemotherapy treatment for prostate cancer.Marland Kitchen   #Has received 4 treatments of Xofigo. S/p weekly Docetaxol x2,  Last week he developed diarrhea, C. Diff toxin positive.  Was started on oral vancomycin.  Patient reports starting vancomycin yesterday. Diarrhea has resolved.  He has had formed bowel movement now.  Continue to have bone pain.  On fentanyl patch 75 MCG every 72 hours and oxycodone 10 mg every 6 hours as needed. Reports on average he takes about 2 oxycodone per day.  Reports pain is 5-6 out of 10.  Better controlled compared to last visit he has been reevaluated by Dr. Baruch Gouty and was scheduled to start palliative radiation . He was also started on dexamethasone for appetite stimulants and bone pain.  Reports appetite has slightly improved.  He has gained 2 pounds since last visit.  During interval, Mediport has been placed.  Reports having no concern about the port.  ROS:  Review of Systems  Constitutional: Positive for appetite change and fatigue. Negative for chills, fever and unexpected weight change.  HENT:   Negative for hearing loss and voice  change.   Eyes: Negative for eye problems and icterus.  Respiratory: Negative for chest tightness, cough and shortness of breath.   Cardiovascular: Negative for chest  pain and leg swelling.  Gastrointestinal: Negative for abdominal distention, abdominal pain and diarrhea.  Endocrine: Negative for hot flashes.  Genitourinary: Negative for difficulty urinating, dysuria and frequency.        Penile lesion  Musculoskeletal: Positive for arthralgias and back pain.  Skin: Negative for itching and rash.  Neurological: Negative for light-headedness and numbness.  Hematological: Negative for adenopathy. Does not bruise/bleed easily.  Psychiatric/Behavioral: Negative for confusion.    MEDICAL HISTORY:  Past Medical History:  Diagnosis Date  . Arthritis   . Benign enlargement of prostate   . CAD (coronary artery disease)   . Constipation   . Elevated PSA   . GERD (gastroesophageal reflux disease)   . Hypertension   . Incomplete bladder emptying   . Nocturia   . Prostate cancer ([place]) 05/03/2017   Rad tx's and Lupron shots.   . Urgency of micturation   . Urinary frequency     SURGICAL HISTORY: Past Surgical History:  Procedure Laterality Date  . GALLBLADDER SURGERY    . PORTA CATH INSERTION N/A 06/07/2018   Procedure: PORTA CATH INSERTION;  Surgeon: Katha Cabal, MD;  Location: [facility];  Service: Cardiovascular;  Laterality: N/A;  . PROSTATE SURGERY    . STOMACH SURGERY      SOCIAL HISTORY: Social History   Socioeconomic History  . Marital status: Married    Spouse name: Not on file  . Number of children: Not on file  . Years of education: Not on file  . Highest education level: Not on file  Occupational History  . Not on file  Social Needs  . Financial resource strain: Not on file  . Food insecurity:    Worry: Not on file    Inability: Not on file  . Transportation needs:    Medical: Not on file    Non-medical: Not on file  Tobacco Use  . Smoking status: Former Smoker    Last attempt to quit: 06/01/1998    Years since quitting: 20.0  . Smokeless tobacco: Never Used  Substance and Sexual Activity  . Alcohol use: No     Alcohol/week: 0.0 standard drinks  . Drug use: Not Currently    Frequency: 2.0 times per week    Types: Marijuana    Comment: marijuana two time monthly gives him an appetite  . Sexual activity: Not on file  Lifestyle  . Physical activity:    Days per week: Not on file    Minutes per session: Not on file  . Stress: Not on file  Relationships  . Social connections:    Talks on phone: Not on file    Gets together: Not on file    Attends religious service: Not on file    Active member of club or organization: Not on file    Attends meetings of clubs or organizations: Not on file    Relationship status: Not on file  . Intimate partner violence:    Fear of current or ex partner: Not on file    Emotionally abused: Not on file    Physically abused: Not on file    Forced sexual activity: Not on file  Other Topics Concern  . Not on file  Social History Narrative  . Not on file    FAMILY HISTORY: Family History  Problem  Relation Age of Onset  . Hypertension Mother   . Stroke Father   . Kidney disease Neg Hx   . Prostate cancer Neg Hx   . Kidney cancer Neg Hx   . Bladder Cancer Neg Hx     ALLERGIES:  has No Known Allergies.  MEDICATIONS:  Current Outpatient Medications  Medication Sig Dispense Refill  . aspirin EC 81 MG tablet Take by mouth.    Marland Kitchen atenolol (TENORMIN) 100 MG tablet Take 100 mg by mouth daily.     . Calcium Carb-Cholecalciferol (CALCIUM-VITAMIN D) 500-200 MG-UNIT tablet Take 1 tablet by mouth daily. 90 tablet 0  . dexamethasone (DECADRON) 4 MG tablet Take 1 tablet (4 mg total) by mouth daily. 60 tablet 0  . fentaNYL (DURAGESIC - DOSED MCG/HR) 25 MCG/HR patch Place 1 patch (25 mcg total) onto the skin every 3 (three) days. 5 patch 0  . fentaNYL (DURAGESIC - DOSED MCG/HR) 50 MCG/HR Place 1 patch (50 mcg total) onto the skin every 3 (three) days. 10 patch 0  . finasteride (PROSCAR) 5 MG tablet Take 1 tablet (5 mg total) by mouth daily. 90 tablet 3  .  hydrocortisone 2.5 % cream APP EXT AA BID  0  . megestrol (MEGACE) 40 MG tablet Take 2 tablets (80 mg total) by mouth 2 (two) times daily. 120 tablet 0  . mirabegron ER (MYRBETRIQ) 25 MG TB24 tablet Take 1 tablet (25 mg total) by mouth daily. 90 tablet 3  . mirtazapine (REMERON) 15 MG tablet Take 15 mg by mouth at bedtime.  11  . nortriptyline (PAMELOR) 25 MG capsule Take 25 mg by mouth at bedtime. Reported on 05/16/2015  1  . omeprazole (PRILOSEC) 20 MG capsule Take 20 mg by mouth daily.    . ondansetron (ZOFRAN) 8 MG tablet Take 1 tablet (8 mg total) by mouth 2 (two) times daily as needed for refractory nausea / vomiting. 30 tablet 1  . Oxycodone HCl 10 MG TABS Take 1 tablet (10 mg total) by mouth every 6 (six) hours as needed. 60 tablet 0  . polyethylene glycol (MIRALAX / GLYCOLAX) packet Take 17 g by mouth daily. Mix one tablespoon with 8oz of your favorite juice or water every day until you are having soft formed stools. Then start taking once daily if you didn't have a stool the day before. 30 each 0  . prochlorperazine (COMPAZINE) 10 MG tablet Take 1 tablet (10 mg total) by mouth every 6 (six) hours as needed (Nausea or vomiting). 30 tablet 1  . pyridOXINE (VITAMIN B-6) 50 MG tablet Take 50 mg by mouth daily.     . tamsulosin (FLOMAX) 0.4 MG CAPS capsule Take 1 capsule (0.4 mg total) by mouth daily. 90 capsule 3  . Telmisartan-Amlodipine 80-5 MG TABS Take 1 tablet by mouth daily.     . vancomycin (VANCOCIN) 125 MG capsule Take 1 capsule (125 mg total) by mouth 4 (four) times daily. 40 capsule 0  . senna-docusate (SENOKOT-S) 8.6-50 MG tablet Take 2 tablets by mouth daily. (Patient not taking: Reported on 06/07/2018) 60 tablet 3   No current facility-administered medications for this visit.       Marland Kitchen  PHYSICAL EXAMINATION: ECOG PERFORMANCE STATUS: 1 - Symptomatic but completely ambulatory Vitals:   06/10/18 0846  BP: (!) 106/58  Pulse: 81  Resp: 18  Temp: (!) 96.2 F (35.7 C)   Filed  Weights   06/10/18 0846  Weight: 174 lb 8 oz (79.2 kg)   Physical  Exam Constitutional:      General: He is not in acute distress.    Appearance: He is not diaphoretic.  HENT:     Head: Normocephalic and atraumatic.     Mouth/Throat:     Pharynx: No oropharyngeal exudate.  Eyes:     General: No scleral icterus.    Conjunctiva/sclera: Conjunctivae normal.     Pupils: Pupils are equal, round, and reactive to light.  Neck:     Musculoskeletal: Normal range of motion and neck supple.     Thyroid: No thyromegaly.  Cardiovascular:     Rate and Rhythm: Normal rate and regular rhythm.     Heart sounds: Normal heart sounds. No murmur.  Pulmonary:     Effort: Pulmonary effort is normal. No respiratory distress.     Breath sounds: Normal breath sounds. No wheezing.  Abdominal:     General: Bowel sounds are normal. There is no distension.     Palpations: Abdomen is soft. There is no mass.     Tenderness: There is no abdominal tenderness.  Musculoskeletal: Normal range of motion.        General: No deformity.  Skin:    General: Skin is warm and dry.     Findings: No erythema or rash.  Neurological:     Mental Status: He is alert and oriented to person, place, and time.     Cranial Nerves: No cranial nerve deficit.     Coordination: Coordination normal.  Psychiatric:        Behavior: Behavior normal.        Thought Content: Thought content normal.    LABORATORY DATA:  I have reviewed the data as listed Lab Results  Component Value Date   WBC 5.3 06/09/2018   HGB 8.5 (L) 06/09/2018   HCT 25.5 (L) 06/09/2018   MCV 84.4 06/09/2018   PLT 203 06/09/2018   Recent Labs    05/27/18 1235 06/03/18 0817 06/09/18 0944  NA 136 135 139  K 4.5 4.0 3.8  CL 105 105 109  CO2 22 21* 21*  GLUCOSE 127* 132* 107*  BUN 25* 17 10  CREATININE 1.06 1.15 1.00  CALCIUM 9.5 9.2 8.7*  GFRNONAA >60 57* >60  GFRAA >60 >60 >60  PROT 7.4 7.4 7.3  ALBUMIN 3.6 3.7 3.8  AST 33 44* 31  ALT '17 17 15   '$ ALKPHOS 34* 35* 36*  BILITOT 0.5 0.8 0.9   RADIOGRAPHIC STUDIES: I have personally reviewed the radiological images as listed and agreed with the findings in the report. 12/15/2017 Bone scan whole body showed new sites of osseous tracer accumulation are identified especially to size at the distal left femoral diaphysis, L2 vertebral body, right iliac bone, right ischium. 01/18/2018 chest abdomen pelvis with contrast Extensive metastasis disease to bone, with all bone lesions new from the abdomen and pelvis CT dated 9/5 2018 and a chest CT dated 06/19/2013.  Involvement is similar to the recent bone scan.  No other evidence of metastatic disease.  No acute abnormalities in the chest abdomen and pelvis.  03/14/2018 CT chest Angio PE protocol 1. No acute cardiopulmonary abnormalities. No evidence for acute pulmonary embolus 2. Coronary artery atherosclerotic calcifications 3.  Aortic Atherosclerosis (ICD10-I70.0). 4. Sclerotic bone metastasis as noted previously  03/25/2018 CT abdomen pelvis with contrast  1. No acute abnormality in the abdomen or pelvis. 2. Extensive metastatic bone disease. Findings are similar to theexam on 01/18/2018.  3. Nonobstructive left kidney stone. 4. Stable left adrenal  nodule.  ASSESSMENT & PLAN: 85 yo male follows up for management of metastatic prostate cancer and neoplasm related symptoms..  1. Prostate cancer ([place])   2. Diarrhea, unspecified type   3. Bone metastasis ([place])   4. Prostate cancer metastatic to bone ([place])   5. Neoplasm related pain    #Castration resistant prostate cancer, PSA continues to rise to 488 despite being on Xtandi.  Status post 4 out of 6 planned [facility] treatment.  Alkaline phosphatase remains normal.  LDH mildly elevated. PSA has been rising.  Status post 2 doses of weekly docetaxel.  Last dose was given 2 weeks ago. Hold chemotherapy treatment today as patient still feels quite weak.  Will reevaluate patient in 1 week for the  next dose of docetaxel.  #C. difficile colitis, continue and finish the course of vancomycin.  Clinically improving.   #Stomach discomfort improved after taking omeprazole continue. #Neoplasm related bone pain, continue dexamethasone, continue current regimen at fentanyl patch 75 MCG every 72 hours plus oxycodone 10 mg every 6 hours as needed.  Pain is better controlled.  He is going to receive palliative radiation next week.  #Somatic BRCA1 mutation positive.  Patient has been referred to genetic counseling. PARP  inhibitors can be considered as the next line of treatment.  Combination with Trudi Ida has not been well studied. Patient has an appointment next week to discuss with genetic counselor.  He prefers to delay and reschedule his appointment   Return of visit: Reevaluate 1 week for evaluation prior to docetaxel treatment We spent sufficient time to discuss many aspect of care, questions were answered to patient's satisfaction.   Earlie Server, MD, PhD Hematology Oncology [facility] Pager- [phone] 06/10/2018

## 2018-06-15 ENCOUNTER — Ambulatory Visit: Payer: Medicare Other | Admitting: Radiation Oncology

## 2018-06-15 ENCOUNTER — Other Ambulatory Visit: Payer: Self-pay

## 2018-06-15 ENCOUNTER — Ambulatory Visit
Admission: RE | Admit: 2018-06-15 | Discharge: 2018-06-15 | Disposition: A | Discharge status: Home / Self Care | Payer: Medicare Other | Source: Ambulatory Visit | Attending: Radiation Oncology | Admitting: Radiation Oncology

## 2018-06-15 DIAGNOSIS — C61 Malignant neoplasm of prostate: Secondary | ICD-10-CM

## 2018-06-15 DIAGNOSIS — C7951 Secondary malignant neoplasm of bone: Secondary | ICD-10-CM | POA: Diagnosis present

## 2018-06-15 MED ORDER — RADIUM RA 223 DICHLORIDE 30 MCCI/ML IV SOLN
122.5000 | Freq: Once | INTRAVENOUS | Status: AC
  Administered 2018-06-15: 122.5 via INTRAVENOUS

## 2018-06-16 ENCOUNTER — Ambulatory Visit
Admission: RE | Admit: 2018-06-16 | Discharge: 2018-06-16 | Disposition: A | Discharge status: Home / Self Care | Payer: Medicare Other | Source: Ambulatory Visit | Attending: Radiation Oncology | Admitting: Radiation Oncology

## 2018-06-16 ENCOUNTER — Inpatient Hospital Stay: Payer: Medicare Other | Attending: Genetics | Admitting: Genetics

## 2018-06-16 ENCOUNTER — Inpatient Hospital Stay: Payer: Medicare Other

## 2018-06-16 DIAGNOSIS — Z510 Encounter for antineoplastic radiation therapy: Secondary | ICD-10-CM | POA: Diagnosis not present

## 2018-06-17 ENCOUNTER — Inpatient Hospital Stay: Payer: Medicare Other

## 2018-06-17 ENCOUNTER — Encounter: Payer: Self-pay | Admitting: Oncology

## 2018-06-17 ENCOUNTER — Inpatient Hospital Stay (HOSPITAL_BASED_OUTPATIENT_CLINIC_OR_DEPARTMENT_OTHER): Payer: Medicare Other | Admitting: Oncology

## 2018-06-17 ENCOUNTER — Other Ambulatory Visit: Payer: Self-pay

## 2018-06-17 VITALS — BP 119/68 | HR 74 | Temp 96.1°F | Resp 18 | Wt 172.8 lb

## 2018-06-17 DIAGNOSIS — R630 Anorexia: Secondary | ICD-10-CM | POA: Diagnosis not present

## 2018-06-17 DIAGNOSIS — R7989 Other specified abnormal findings of blood chemistry: Secondary | ICD-10-CM | POA: Diagnosis not present

## 2018-06-17 DIAGNOSIS — M549 Dorsalgia, unspecified: Secondary | ICD-10-CM

## 2018-06-17 DIAGNOSIS — R197 Diarrhea, unspecified: Secondary | ICD-10-CM

## 2018-06-17 DIAGNOSIS — C61 Malignant neoplasm of prostate: Secondary | ICD-10-CM

## 2018-06-17 DIAGNOSIS — K5903 Drug induced constipation: Secondary | ICD-10-CM

## 2018-06-17 DIAGNOSIS — Z5111 Encounter for antineoplastic chemotherapy: Secondary | ICD-10-CM

## 2018-06-17 DIAGNOSIS — R634 Abnormal weight loss: Secondary | ICD-10-CM

## 2018-06-17 DIAGNOSIS — R338 Other retention of urine: Secondary | ICD-10-CM

## 2018-06-17 DIAGNOSIS — I10 Essential (primary) hypertension: Secondary | ICD-10-CM

## 2018-06-17 DIAGNOSIS — M1990 Unspecified osteoarthritis, unspecified site: Secondary | ICD-10-CM

## 2018-06-17 DIAGNOSIS — R530 Neoplastic (malignant) related fatigue: Secondary | ICD-10-CM

## 2018-06-17 DIAGNOSIS — C7951 Secondary malignant neoplasm of bone: Secondary | ICD-10-CM | POA: Diagnosis not present

## 2018-06-17 DIAGNOSIS — T402X5S Adverse effect of other opioids, sequela: Secondary | ICD-10-CM

## 2018-06-17 DIAGNOSIS — K219 Gastro-esophageal reflux disease without esophagitis: Secondary | ICD-10-CM

## 2018-06-17 DIAGNOSIS — Z87891 Personal history of nicotine dependence: Secondary | ICD-10-CM

## 2018-06-17 DIAGNOSIS — Z79899 Other long term (current) drug therapy: Secondary | ICD-10-CM

## 2018-06-17 DIAGNOSIS — N401 Enlarged prostate with lower urinary tract symptoms: Secondary | ICD-10-CM

## 2018-06-17 DIAGNOSIS — I2510 Atherosclerotic heart disease of native coronary artery without angina pectoris: Secondary | ICD-10-CM

## 2018-06-17 DIAGNOSIS — Z1501 Genetic susceptibility to malignant neoplasm of breast: Secondary | ICD-10-CM

## 2018-06-17 DIAGNOSIS — G893 Neoplasm related pain (acute) (chronic): Secondary | ICD-10-CM

## 2018-06-17 DIAGNOSIS — Z7982 Long term (current) use of aspirin: Secondary | ICD-10-CM

## 2018-06-17 LAB — CBC WITH DIFFERENTIAL/PLATELET
Abs Immature Granulocytes: 0.06 10*3/uL (ref 0.00–0.07)
Basophils Absolute: 0 10*3/uL (ref 0.0–0.1)
Basophils Relative: 0 %
Eosinophils Absolute: 0 10*3/uL (ref 0.0–0.5)
Eosinophils Relative: 0 %
HCT: 25 % — ABNORMAL LOW (ref 39.0–52.0)
Hemoglobin: 8.4 g/dL — ABNORMAL LOW (ref 13.0–17.0)
Immature Granulocytes: 1 %
Lymphocytes Relative: 10 %
Lymphs Abs: 1 10*3/uL (ref 0.7–4.0)
MCH: 28.1 pg (ref 26.0–34.0)
MCHC: 33.6 g/dL (ref 30.0–36.0)
MCV: 83.6 fL (ref 80.0–100.0)
Monocytes Absolute: 1.1 10*3/uL — ABNORMAL HIGH (ref 0.1–1.0)
Monocytes Relative: 12 %
NEUTROS ABS: 7.1 10*3/uL (ref 1.7–7.7)
Neutrophils Relative %: 77 %
Platelets: 234 10*3/uL (ref 150–400)
RBC: 2.99 MIL/uL — ABNORMAL LOW (ref 4.22–5.81)
RDW: 15.5 % (ref 11.5–15.5)
WBC: 9.2 10*3/uL (ref 4.0–10.5)
nRBC: 0 % (ref 0.0–0.2)

## 2018-06-17 LAB — COMPREHENSIVE METABOLIC PANEL
ALT: 14 U/L (ref 0–44)
AST: 27 U/L (ref 15–41)
Albumin: 3.5 g/dL (ref 3.5–5.0)
Alkaline Phosphatase: 35 U/L — ABNORMAL LOW (ref 38–126)
Anion gap: 8 (ref 5–15)
BUN: 14 mg/dL (ref 8–23)
CO2: 20 mmol/L — AB (ref 22–32)
Calcium: 9.3 mg/dL (ref 8.9–10.3)
Chloride: 110 mmol/L (ref 98–111)
Creatinine, Ser: 0.81 mg/dL (ref 0.61–1.24)
GFR calc Af Amer: >60 mL/min (ref >60)
GFR calc non Af Amer: >60 mL/min (ref >60)
GLUCOSE: 118 mg/dL — AB (ref 70–99)
Potassium: 3.7 mmol/L (ref 3.5–5.1)
SODIUM: 138 mmol/L (ref 135–145)
Total Bilirubin: 0.5 mg/dL (ref 0.3–1.2)
Total Protein: 7.4 g/dL (ref 6.5–8.1)

## 2018-06-17 LAB — SAMPLE TO BLOOD BANK

## 2018-06-17 MED ORDER — HEPARIN SOD (PORK) LOCK FLUSH 100 UNIT/ML IV SOLN
500.0000 [IU] | Freq: Once | INTRAVENOUS | Status: AC
  Administered 2018-06-17: 500 [IU] via INTRAVENOUS
  Filled 2018-06-17: qty 5

## 2018-06-17 MED ORDER — DEXAMETHASONE SODIUM PHOSPHATE 10 MG/ML IJ SOLN
10.0000 mg | Freq: Once | INTRAMUSCULAR | Status: AC
  Administered 2018-06-17: 10 mg via INTRAVENOUS
  Filled 2018-06-17: qty 1

## 2018-06-17 MED ORDER — OXYCODONE HCL 10 MG PO TABS: 10.0000 mg | ORAL_TABLET | Freq: Four times a day (QID) | ORAL | 0 refills | Status: AC | PRN

## 2018-06-17 MED ORDER — SODIUM CHLORIDE 0.9 % IV SOLN
Freq: Once | INTRAVENOUS | Status: AC
  Administered 2018-06-17: 09:00:00 via INTRAVENOUS
  Filled 2018-06-17: qty 250

## 2018-06-17 MED ORDER — LIDOCAINE-PRILOCAINE 2.5-2.5 % EX CREA: 1.0000 "application " | TOPICAL_CREAM | CUTANEOUS | 3 refills | Status: DC | PRN

## 2018-06-17 MED ORDER — SODIUM CHLORIDE 0.9% FLUSH
10.0000 mL | Freq: Once | INTRAVENOUS | Status: AC
  Administered 2018-06-17: 10 mL via INTRAVENOUS
  Filled 2018-06-17: qty 10

## 2018-06-17 MED ORDER — LIDOCAINE-PRILOCAINE 2.5-2.5 % EX CREA: TOPICAL_CREAM | CUTANEOUS | Status: DC | PRN

## 2018-06-17 MED ORDER — SODIUM CHLORIDE 0.9 % IV SOLN
36.0000 mg/m2 | Freq: Once | INTRAVENOUS | Status: AC
  Administered 2018-06-17: 70 mg via INTRAVENOUS
  Filled 2018-06-17: qty 7

## 2018-06-17 NOTE — Progress Notes (Signed)
Patient here for follow up. States he no longer has diarrhea, but he is having constipation now and "sometimes its black."  Pt requesting lidocaine cream for port.

## 2018-06-17 NOTE — Progress Notes (Signed)
Hematology/Oncology  Follow up note [facility] [phone] [phone]  Patient Care Team: [place] Pink, MD as PCP - General (Family Medicine) Earlie Server, MD as Medical Oncologist (Medical Oncology)  REASON FOR VISIT Follow up for antineoplasm treatment of Prostate Cancer  HISTORY OF PRESENTING ILLNESS:  Matthew Hunter 85 y.o.  male with PMH listed as below who was referred by [facility] Provider [name] to me for evaluation of clinical prostate cancer and management.  He has a history of elevated PSA and BPH. PSA trends as follows: 11/14/2014 4.3, 11/07/2015 7.1, 05/11/2016 10.4, 06/11/2016 10.6, 07/07/2016 7.8, 01/06/2017 86.8.  He was seen by urology and was diagnosed with clinical prostate cancer given the extreme elevation of PSA and prostate nodule. Biopsy was not pursued. He was treated with [name] on 01/29/2017,  # prostate biopsy on 03/10/2017. 5 out of 6 cores positive for prostate cancer, with highest gleason score group 9 (4+5), + perineural invasion.  Stage IV prostate cancer, multiple osseus metastatic disease. No visceral involvement.   01/18/2018  CT scan image was independently reviewed by me and discussed with patient.  Patient has progression of bone metastatic disease only  Current treatment  Patient got first Mosier on 01/29/2018, switched to leupron on 04/13/2015,  Q6 months.  .Testosterone level at 14, castration level.  Upfront Gillermina Phy was added on December 2019 to assist fast disease control (phase II trial [place] B, [facility] Oncol. 2014) S/p palliative RT to lumber spine and SI joints. Xtandi discontinued on due to CNS toxicity in April 2019.  12/09/17 Abiaterone discontinued.  7/ 18/2019 Start Apalutimide '240mg'$  daily.   Trend of tumor marker  PSA 86.8 -->64.78 -->131--> 242 added Xtanti --> 17.74-->1.82 Xtandi stopped due to CNS toxicity in April 2019. --> 1.79- (added Abiaterone)-> 11.06-->24.53--> 39.48  [Abiaterone stopped, Added Apalutimid]--> 65.71-->88.63--> Xofigo treatment--> 215--> Back on Xtandi reduced dose.   12/09/17 Abiaterone discontinued due to rising PSA and disease progression. 12/16/2017 Start Apalutimide '240mg'$  daily. Held on 01/13/2018 due to rising PSA, also causing patient to feel dizziness/black out.  Started Xofigo treatment on 02/23/2018. 02/28/2018, back on Xtandi reduced dose '120mg'$ . Xtandi stopped Dec.2020 due to   05/20/2018 Weekly Docetaxol 2 weeks on one week off.   INTERVAL HISTORY Patient presents to follow up for management of metastatic prostate cancer and follow-up on his metastatic prostate cancer related symptoms, C. difficile colitis and assessment prior to chemotherapy treatment for prostate cancer.Marland Kitchen   #Has received 4 treatments of Xofigo. S/p weekly Docetaxol x2,  Patient developed C. difficile colitis, and is on oral vancomycin. Diarrhea stopped.  Patient also reports started to have constipation.  Stool is dark.  #Neoplasm related bone pain, on fentanyl patch 75 MCG every 72 hours and oxycodone 10 mg every 6 hours as needed. He requests refill of oxycodone Pain is better controlled however he still has intermittent left rib cage pain. He has seen Dr. Baruch Gouty and has been scheduled to start palliative radiation next week.  Patient also started on dexamethasone for appetite stimulant and is of bone pain.  Reports appetite slightly better.  His weight is relatively stable compared to 06/03/2018.    Review of Systems  Constitutional: Positive for appetite change and fatigue. Negative for chills, fever and unexpected weight change.  HENT:   Negative for hearing loss and voice change.   Eyes: Negative for eye problems and icterus.  Respiratory: Negative for chest tightness, cough and shortness of breath.   Cardiovascular: Negative for chest pain  and leg swelling.  Gastrointestinal: Negative for abdominal distention, abdominal pain and diarrhea.  Endocrine:  Negative for hot flashes.  Genitourinary: Negative for difficulty urinating, dysuria and frequency.   Musculoskeletal: Positive for arthralgias and back pain.  Skin: Negative for itching and rash.  Neurological: Negative for light-headedness and numbness.  Hematological: Negative for adenopathy. Does not bruise/bleed easily.  Psychiatric/Behavioral: Negative for confusion.    MEDICAL HISTORY:  Past Medical History:  Diagnosis Date  . Arthritis   . Benign enlargement of prostate   . CAD (coronary artery disease)   . Constipation   . Elevated PSA   . GERD (gastroesophageal reflux disease)   . Hypertension   . Incomplete bladder emptying   . Nocturia   . Prostate cancer ([place]) 05/03/2017   Rad tx's and Lupron shots.   . Urgency of micturation   . Urinary frequency     SURGICAL HISTORY: Past Surgical History:  Procedure Laterality Date  . GALLBLADDER SURGERY    . PORTA CATH INSERTION N/A 06/07/2018   Procedure: PORTA CATH INSERTION;  Surgeon: Katha Cabal, MD;  Location: [facility];  Service: Cardiovascular;  Laterality: N/A;  . PROSTATE SURGERY    . STOMACH SURGERY      SOCIAL HISTORY: Social History   Socioeconomic History  . Marital status: Married    Spouse name: Not on file  . Number of children: Not on file  . Years of education: Not on file  . Highest education level: Not on file  Occupational History  . Not on file  Social Needs  . Financial resource strain: Not on file  . Food insecurity:    Worry: Not on file    Inability: Not on file  . Transportation needs:    Medical: Not on file    Non-medical: Not on file  Tobacco Use  . Smoking status: Former Smoker    Last attempt to quit: 06/01/1998    Years since quitting: 20.0  . Smokeless tobacco: Never Used  Substance and Sexual Activity  . Alcohol use: No    Alcohol/week: 0.0 standard drinks  . Drug use: Not Currently    Frequency: 2.0 times per week    Types: Marijuana    Comment:  marijuana two time monthly gives him an appetite  . Sexual activity: Not on file  Lifestyle  . Physical activity:    Days per week: Not on file    Minutes per session: Not on file  . Stress: Not on file  Relationships  . Social connections:    Talks on phone: Not on file    Gets together: Not on file    Attends religious service: Not on file    Active member of club or organization: Not on file    Attends meetings of clubs or organizations: Not on file    Relationship status: Not on file  . Intimate partner violence:    Fear of current or ex partner: Not on file    Emotionally abused: Not on file    Physically abused: Not on file    Forced sexual activity: Not on file  Other Topics Concern  . Not on file  Social History Narrative  . Not on file    FAMILY HISTORY: Family History  Problem Relation Age of Onset  . Hypertension Mother   . Stroke Father   . Kidney disease Neg Hx   . Prostate cancer Neg Hx   . Kidney cancer Neg Hx   .  Bladder Cancer Neg Hx     ALLERGIES:  has No Known Allergies.  MEDICATIONS:  Current Outpatient Medications  Medication Sig Dispense Refill  . aspirin EC 81 MG tablet Take by mouth.    Marland Kitchen atenolol (TENORMIN) 100 MG tablet Take 100 mg by mouth daily.     . Calcium Carb-Cholecalciferol (CALCIUM-VITAMIN D) 500-200 MG-UNIT tablet Take 1 tablet by mouth daily. 90 tablet 0  . dexamethasone (DECADRON) 4 MG tablet Take 1 tablet (4 mg total) by mouth daily. 60 tablet 0  . fentaNYL (DURAGESIC - DOSED MCG/HR) 25 MCG/HR patch Place 1 patch (25 mcg total) onto the skin every 3 (three) days. 5 patch 0  . fentaNYL (DURAGESIC - DOSED MCG/HR) 50 MCG/HR Place 1 patch (50 mcg total) onto the skin every 3 (three) days. 10 patch 0  . finasteride (PROSCAR) 5 MG tablet Take 1 tablet (5 mg total) by mouth daily. 90 tablet 3  . hydrocortisone 2.5 % cream APP EXT AA BID  0  . megestrol (MEGACE) 40 MG tablet Take 2 tablets (80 mg total) by mouth 2 (two) times daily. 120  tablet 0  . mirabegron ER (MYRBETRIQ) 25 MG TB24 tablet Take 1 tablet (25 mg total) by mouth daily. 90 tablet 3  . mirtazapine (REMERON) 15 MG tablet Take 15 mg by mouth at bedtime.  11  . nortriptyline (PAMELOR) 25 MG capsule Take 25 mg by mouth at bedtime. Reported on 05/16/2015  1  . omeprazole (PRILOSEC) 20 MG capsule Take 20 mg by mouth daily.    . ondansetron (ZOFRAN) 8 MG tablet Take 1 tablet (8 mg total) by mouth 2 (two) times daily as needed for refractory nausea / vomiting. 30 tablet 1  . Oxycodone HCl 10 MG TABS Take 1 tablet (10 mg total) by mouth every 6 (six) hours as needed. 60 tablet 0  . polyethylene glycol (MIRALAX / GLYCOLAX) packet Take 17 g by mouth daily. Mix one tablespoon with 8oz of your favorite juice or water every day until you are having soft formed stools. Then start taking once daily if you didn't have a stool the day before. 30 each 0  . prochlorperazine (COMPAZINE) 10 MG tablet Take 1 tablet (10 mg total) by mouth every 6 (six) hours as needed (Nausea or vomiting). 30 tablet 1  . pyridOXINE (VITAMIN B-6) 50 MG tablet Take 50 mg by mouth daily.     Marland Kitchen senna-docusate (SENOKOT-S) 8.6-50 MG tablet Take 2 tablets by mouth daily. (Patient not taking: Reported on 06/07/2018) 60 tablet 3  . tamsulosin (FLOMAX) 0.4 MG CAPS capsule Take 1 capsule (0.4 mg total) by mouth daily. 90 capsule 3  . Telmisartan-Amlodipine 80-5 MG TABS Take 1 tablet by mouth daily.     . vancomycin (VANCOCIN) 125 MG capsule Take 1 capsule (125 mg total) by mouth 4 (four) times daily. 40 capsule 0   No current facility-administered medications for this visit.    Facility-Administered Medications Ordered in Other Visits  Medication Dose Route Frequency Provider Last Rate Last Dose  . heparin lock flush 100 unit/mL  500 Units Intravenous Once Earlie Server, MD          .  PHYSICAL EXAMINATION: ECOG PERFORMANCE STATUS: 1 - Symptomatic but completely ambulatory Vitals:   06/17/18 0839  BP: 119/68    Pulse: 74  Resp: 18  Temp: (!) 96.1 F (35.6 C)   Filed Weights   06/17/18 0839  Weight: 172 lb 12.8 oz (78.4 kg)   Physical  Exam Constitutional:      General: He is not in acute distress.    Appearance: He is not diaphoretic.  HENT:     Head: Normocephalic and atraumatic.     Mouth/Throat:     Pharynx: No oropharyngeal exudate.  Eyes:     General: No scleral icterus.    Conjunctiva/sclera: Conjunctivae normal.     Pupils: Pupils are equal, round, and reactive to light.  Neck:     Musculoskeletal: Normal range of motion and neck supple.     Thyroid: No thyromegaly.  Cardiovascular:     Rate and Rhythm: Normal rate and regular rhythm.     Heart sounds: Normal heart sounds. No murmur.  Pulmonary:     Effort: Pulmonary effort is normal. No respiratory distress.     Breath sounds: Normal breath sounds. No wheezing.  Abdominal:     General: Bowel sounds are normal. There is no distension.     Palpations: Abdomen is soft. There is no mass.     Tenderness: There is no abdominal tenderness.  Musculoskeletal: Normal range of motion.        General: No deformity.  Skin:    General: Skin is warm and dry.     Findings: No erythema or rash.  Neurological:     Mental Status: He is alert and oriented to person, place, and time.     Cranial Nerves: No cranial nerve deficit.     Coordination: Coordination normal.  Psychiatric:        Behavior: Behavior normal.        Thought Content: Thought content normal.    LABORATORY DATA:  I have reviewed the data as listed Lab Results  Component Value Date   WBC 5.3 06/09/2018   HGB 8.5 (L) 06/09/2018   HCT 25.5 (L) 06/09/2018   MCV 84.4 06/09/2018   PLT 203 06/09/2018   Recent Labs    05/27/18 1235 06/03/18 0817 06/09/18 0944  NA 136 135 139  K 4.5 4.0 3.8  CL 105 105 109  CO2 22 21* 21*  GLUCOSE 127* 132* 107*  BUN 25* 17 10  CREATININE 1.06 1.15 1.00  CALCIUM 9.5 9.2 8.7*  GFRNONAA >60 57* >60  GFRAA >60 >60 >60   PROT 7.4 7.4 7.3  ALBUMIN 3.6 3.7 3.8  AST 33 44* 31  ALT '17 17 15  '$ ALKPHOS 34* 35* 36*  BILITOT 0.5 0.8 0.9   RADIOGRAPHIC STUDIES: I have personally reviewed the radiological images as listed and agreed with the findings in the report. 12/15/2017 Bone scan whole body showed new sites of osseous tracer accumulation are identified especially to size at the distal left femoral diaphysis, L2 vertebral body, right iliac bone, right ischium. 01/18/2018 chest abdomen pelvis with contrast Extensive metastasis disease to bone, with all bone lesions new from the abdomen and pelvis CT dated 9/5 2018 and a chest CT dated 06/19/2013.  Involvement is similar to the recent bone scan.  No other evidence of metastatic disease.  No acute abnormalities in the chest abdomen and pelvis.  03/14/2018 CT chest Angio PE protocol 1. No acute cardiopulmonary abnormalities. No evidence for acute pulmonary embolus 2. Coronary artery atherosclerotic calcifications 3.  Aortic Atherosclerosis (ICD10-I70.0). 4. Sclerotic bone metastasis as noted previously  03/25/2018 CT abdomen pelvis with contrast  1. No acute abnormality in the abdomen or pelvis. 2. Extensive metastatic bone disease. Findings are similar to theexam on 01/18/2018.  3. Nonobstructive left kidney stone. 4. Stable left adrenal  nodule.  ASSESSMENT & PLAN: 85 yo male follows up for management of metastatic prostate cancer and neoplasm related symptoms..  1. Prostate cancer ([place])   2. Bone metastasis ([place])   3. Neoplasm related pain   4. Encounter for antineoplastic chemotherapy   5. Weight loss   6. 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  Status post 4 out of 6 planned [facility] treatment.  Alkaline phosphatase remains normal.  LDH mildly elevated. Status post 2 doses of weekly docetaxel.  Last dose was given 2 weeks ago. Labs reviewed and discussed with patient.   Clinically he is also doing better. Counts acceptable to proceed with today's docetaxel. He is going to start radiation next week so I will give him 1 week of chemo break. Resume chemotherapy in 2 weeks.  #C. difficile colitis, to finish the course of vancomycin.  Diarrhea resolved. #Opioid-induced constipation: He has been off laxatives due to colitis.  Now he is constipated.  Advised patient to go back on his bowel regimen. #On appetite stimulant with Megace and dexamethasone.  Advised patient to continue omeprazole.  #Neoplasm related pain.  Continue fentanyl patch 75 MCG every 72 hours, oxycodone 10 mg every 6 hours as needed. He is starting radiation next week.  #Somatic BRCA1 mutation positive.  Patient has been referred to genetic counseling. PARP  inhibitors can be considered as the next line of treatment.  Combination with Trudi Ida has not been well studied. Patient rescheduled his genetic counselor appointment.  Return of visit: Reevaluate 2 week for evaluation prior to docetaxel treatment  Total face to face encounter time for this patient visit was 25 min. >50% of the time was  spent in counseling and coordination of care.   Earlie Server, MD, PhD Hematology Oncology [facility] Pager- [phone] 06/17/2018

## 2018-06-20 ENCOUNTER — Ambulatory Visit
Admission: RE | Admit: 2018-06-20 | Discharge: 2018-06-20 | Disposition: A | Discharge status: Home / Self Care | Payer: Medicare Other | Source: Ambulatory Visit | Attending: Radiation Oncology | Admitting: Radiation Oncology

## 2018-06-20 DIAGNOSIS — Z510 Encounter for antineoplastic radiation therapy: Secondary | ICD-10-CM | POA: Diagnosis not present

## 2018-06-21 ENCOUNTER — Ambulatory Visit
Admission: RE | Admit: 2018-06-21 | Discharge: 2018-06-21 | Disposition: A | Discharge status: Home / Self Care | Payer: Medicare Other | Source: Ambulatory Visit | Attending: Radiation Oncology | Admitting: Radiation Oncology

## 2018-06-21 DIAGNOSIS — Z510 Encounter for antineoplastic radiation therapy: Secondary | ICD-10-CM | POA: Diagnosis not present

## 2018-06-22 ENCOUNTER — Ambulatory Visit
Admission: RE | Admit: 2018-06-22 | Discharge: 2018-06-22 | Disposition: A | Discharge status: Home / Self Care | Payer: Medicare Other | Source: Ambulatory Visit | Attending: Radiation Oncology | Admitting: Radiation Oncology

## 2018-06-22 DIAGNOSIS — Z510 Encounter for antineoplastic radiation therapy: Secondary | ICD-10-CM | POA: Diagnosis not present

## 2018-06-23 ENCOUNTER — Ambulatory Visit
Admission: RE | Admit: 2018-06-23 | Discharge: 2018-06-23 | Disposition: A | Discharge status: Home / Self Care | Payer: Medicare Other | Source: Ambulatory Visit | Attending: Radiation Oncology | Admitting: Radiation Oncology

## 2018-06-23 ENCOUNTER — Other Ambulatory Visit: Payer: Self-pay | Admitting: Oncology

## 2018-06-23 DIAGNOSIS — Z510 Encounter for antineoplastic radiation therapy: Secondary | ICD-10-CM | POA: Diagnosis not present

## 2018-06-24 ENCOUNTER — Ambulatory Visit
Admission: RE | Admit: 2018-06-24 | Discharge: 2018-06-24 | Disposition: A | Discharge status: Home / Self Care | Payer: Medicare Other | Source: Ambulatory Visit | Attending: Radiation Oncology | Admitting: Radiation Oncology

## 2018-06-24 DIAGNOSIS — Z510 Encounter for antineoplastic radiation therapy: Secondary | ICD-10-CM | POA: Diagnosis not present

## 2018-06-30 ENCOUNTER — Inpatient Hospital Stay (HOSPITAL_BASED_OUTPATIENT_CLINIC_OR_DEPARTMENT_OTHER): Payer: Medicare Other | Admitting: Genetics

## 2018-06-30 ENCOUNTER — Encounter: Payer: Self-pay | Admitting: Genetics

## 2018-06-30 DIAGNOSIS — C61 Malignant neoplasm of prostate: Secondary | ICD-10-CM

## 2018-06-30 DIAGNOSIS — Z1379 Encounter for other screening for genetic and chromosomal anomalies: Secondary | ICD-10-CM | POA: Diagnosis not present

## 2018-06-30 DIAGNOSIS — Z8042 Family history of malignant neoplasm of prostate: Secondary | ICD-10-CM | POA: Insufficient documentation

## 2018-06-30 DIAGNOSIS — Z803 Family history of malignant neoplasm of breast: Secondary | ICD-10-CM | POA: Diagnosis not present

## 2018-06-30 DIAGNOSIS — C7951 Secondary malignant neoplasm of bone: Secondary | ICD-10-CM | POA: Diagnosis not present

## 2018-06-30 NOTE — Addendum Note (Signed)
Addended by: Tana Felts on: 06/30/2018 11:37 AM   Modules accepted: Orders

## 2018-06-30 NOTE — Progress Notes (Signed)
REFERRING PROVIDER: Earlie Server, MD [place], [place] [address]  PRIMARY PROVIDER:  [place] Pink, MD  PRIMARY REASON FOR VISIT:  1. Prostate cancer ([place])   2. Family history of breast cancer   3. Family history of prostate cancer   4. Bone metastasis ([place])   5. Prostate cancer metastatic to bone [facility])     HISTORY OF PRESENT ILLNESS:   Mr. Paynter, a 85 y.o. male, was seen for a [place] cancer genetics consultation at the request of Dr. Tasia Catchings due to a personal and family history of cancer.  Mr. Laster presents to clinic today to discuss the possibility of a hereditary predisposition to cancer, genetic testing, and to further clarify his future cancer risks, as well as potential cancer risks for family members.    Mr. Bommarito is an 85 year-old man who has metastatic prostate cancer dx at 10.  Gleason 4+5=9.  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  He is here to discuss germline genetic testing to determine if this mutation is somatic only or could be a hereditary/germline BRCA1 mutation.     CANCER HISTORY:  Oncology History   Aasir Daigler 85 y.o.  male with PMH listed as below who was referred by [facility] Urology Provider [name] to me for evaluation of clinical prostate cancer and management.  He has a history of elevated PSA and BPH. PSA trends as follows: 11/14/2014 4.3, 11/07/2015 7.1, 05/11/2016 10.4, 06/11/2016 10.6, 07/07/2016 7.8, 01/06/2017 86.8.  He was seen by urology and was diagnosed with clinical prostate cancer given the extreme elevation of PSA and prostate nodule. Biopsy was not pursued. He was treated with [name] on 01/29/2017,  # prostate biopsy on 03/10/2017. 5 out of 6 cores positive for prostate cancer, with highest gleason score group 9 (4+5), + perineural invasion.  Stage IV prostate cancer, multiple osseus metastatic disease. No visceral involvement.   01/18/2018  CT scan image was independently  reviewed by me and discussed with patient.  Patient has progression of bone metastatic disease only  Current treatment  Patient got first [place] on 01/29/2018, switched to leupron on 04/13/2015,  Q6 months.  .Testosterone level at 14, castration level.  Upfront Gillermina Phy was added on December 2019 to assist fast disease control (phase II trial [place] B, [facility] Oncol. 2014) S/p palliative RT to lumber spine and SI joints. Xtandi discontinued on due to CNS toxicity in April 2019.  12/09/17 Abiaterone discontinued.  7/ 18/2019 Start Apalutimide '240mg'$  daily.   INTERVAL HISTORY Patient presents to follow up for management of metastatic prostate cancer.  PSA 86.8 -->64.78 -->131--> 242 added Xtanti --> 17.74-->1.82 Xtandi stopped--> 1.79- (added Abiaterone)-> 11.06-->24.53--> 39.48 [Abiaterone stopped, Added Apalutimid]--> 65.71--> Xtandi discontinued on due to CNS toxicity in April 2019.  12/09/17 Abiaterone discontinued due to rising PSA and disease progression. 12/16/2017 Start Apalutimide '240mg'$  daily. Held on 01/13/2018 due to rising PSA, also causing patient to feel dizziness/black out.      Prostate cancer ([place])   05/03/2017 Initial Diagnosis    Prostate cancer ([place])    05/20/2018 -  Chemotherapy    The patient had DOCEtaxel (TAXOTERE) 70 mg in sodium chloride 0.9 % 150 mL chemo infusion, 36 mg/m2 = 70 mg, Intravenous,  Once, 3 of 4 cycles Administration: 70 mg (05/20/2018), 70 mg (05/27/2018), 70 mg (06/17/2018)  for chemotherapy treatment.      Colonoscopy: yes; patient does not remember the results.   Past Medical History:  Diagnosis Date  . Arthritis   . Benign enlargement of prostate   . CAD (coronary artery disease)   . Constipation   . Elevated PSA   . Family history of breast cancer   . Family history of prostate cancer   . GERD (gastroesophageal reflux disease)   . Hypertension   . Incomplete bladder emptying   . Nocturia   . Prostate cancer ([place]) 05/03/2017   Rad  tx's and Lupron shots.   . Urgency of micturation   . Urinary frequency     Past Surgical History:  Procedure Laterality Date  . GALLBLADDER SURGERY    . PORTA CATH INSERTION N/A 06/07/2018   Procedure: PORTA CATH INSERTION;  Surgeon: Katha Cabal, MD;  Location: [facility];  Service: Cardiovascular;  Laterality: N/A;  . PROSTATE SURGERY    . STOMACH SURGERY      Social History   Socioeconomic History  . Marital status: Married    Spouse name: Not on file  . Number of children: Not on file  . Years of education: Not on file  . Highest education level: Not on file  Occupational History  . Not on file  Social Needs  . Financial resource strain: Not on file  . Food insecurity:    Worry: Not on file    Inability: Not on file  . Transportation needs:    Medical: Not on file    Non-medical: Not on file  Tobacco Use  . Smoking status: Former Smoker    Last attempt to quit: 06/01/1998    Years since quitting: 20.0  . Smokeless tobacco: Never Used  Substance and Sexual Activity  . Alcohol use: No    Alcohol/week: 0.0 standard drinks  . Drug use: Not Currently    Frequency: 2.0 times per week    Types: Marijuana    Comment: marijuana two time monthly gives him an appetite  . Sexual activity: Not on file  Lifestyle  . Physical activity:    Days per week: Not on file    Minutes per session: Not on file  . Stress: Not on file  Relationships  . Social connections:    Talks on phone: Not on file    Gets together: Not on file    Attends religious service: Not on file    Active member of club or organization: Not on file    Attends meetings of clubs or organizations: Not on file    Relationship status: Not on file  Other Topics Concern  . Not on file  Social History Narrative  . Not on file     FAMILY HISTORY:  We obtained a detailed, 4-generation family history.  Significant diagnoses are listed below: Family History  Problem Relation Age of Onset  .  Hypertension Mother   . Stroke Father   . Breast cancer Niece        dx  under 81  . Breast cancer Daughter 60  . Prostate cancer Son 65  . Kidney disease Neg Hx   . Kidney cancer Neg Hx   . Bladder Cancer Neg Hx     Mr. Gilmer has 6 daughters and 1 son. 1 of his daughters was dx with breast cancer at 53 and prostate cancer at 51.    He has 3 paternal half-brothers with no hx of cancer.  He also has 5 full sisters and 6 full brothers.  He does not know much about their health, but is not  aware of any cancer.  1 of his nieces (bother's daughter) was dx with breast cancer under the age of 15.   Mr. Urwin father: died at 47, unk health information.  Patient does not know/remember any other information about his paternal relatives.   Mr. Oscar mother: died at 20, no known hx of cancer.   The patient does now know/remember any information about his maternal relatives.   Mr. Forehand is unaware of previous family history of genetic testing for hereditary cancer risks. Patient's maternal ancestors are of African American descent, and paternal ancestors are of African American  descent. There is no reported Ashkenazi Jewish ancestry. There is no known consanguinity.  GENETIC COUNSELING ASSESSMENT: Revere Maahs is a 85 y.o. male with a personal and family history which is somewhat suggestive of a Hereditary Cancer Predisposition Syndrome. We, therefore, discussed and recommended the following at today's visit.   DISCUSSION: We reviewed the characteristics, features and inheritance patterns of hereditary cancer syndromes. We also discussed genetic testing, including the appropriate family members to test, the process of testing, insurance coverage and turn-around-time for results. We discussed the implications of a negative, positive and/or variant of uncertain significant result. We recommended Mr. Starace pursue genetic testing for the Common Hereditary Cancers gene panel.   The  Common Hereditary Cancer Panel offered by [facility] includes sequencing and/or deletion duplication testing of the following 53 genes: APC, ATM, AXIN2, BARD1, BMPR1A, BRCA1, BRCA2, BRIP1, BUB1B, CDH1, CDK4, CDKN2A, CHEK2, CTNNA1, DICER1, ENG, EPCAM, GALNT12, GREM1, HOXB13, KIT, MEN1, MLH1, MLH3, MSH2, MSH3, MSH6, MUTYH, NBN, NF1, NTHL1, PALB2, PDGFRA, PMS2, POLD1, POLE, PTEN, RAD50, RAD51C, RAD51D, RNF43, RPS20, SDHA, SDHB, SDHC, SDHD, SMAD4, SMARCA4, STK11, TP53, TSC1, TSC2, VHL  One of the most common hereditary cancer syndromes that increases prostate cancer risk is called Hereditary Breast and Ovarian Cancer (HBOC) syndrome.  This syndrome is caused by mutations in the BRCA1 and BRCA2 genes.  This syndrome increases an individual's lifetime risk to develop breast, ovarian, pancreatic, and other types of cancer.  There are also many other cancer predisposition syndromes caused by mutations in several other genes.  We discussed that if he is found to have a mutation in one of these genes, it may impact future medical management recommendations such as increased cancer screenings and consideration of risk reducing surgeries.  A positive result could also have implications for the patient's family members.  A Negative result would mean the BRAC1 mutation that was identified in his tumor testing is likely only somatic and not a germline/hereditary BRCA1 mutation.  It would also mean we did not identify a hereditary component to his cancer, but does not rule out the possibility of a hereditary basis for his  cancer.  There could be mutations that are undetectable by current technology, or in genes not yet tested or identified to increase cancer risk.    We discussed the potential to find a Variant of Uncertain Significance or VUS.  These are variants that have not yet been identified as pathogenic or benign, and it is unknown if this variant is associated with increased cancer risk or if this is a normal  finding.  Most VUS's are reclassified to benign or likely benign.   It should not be used to make medical management decisions. With time, we suspect the lab will determine the significance of any VUS's identified if any.   Based on Mr. Haughn personal and family history of cancer, he meets medical criteria for genetic testing. Despite that  he meets criteria, he may still have an out of pocket cost. The laboratory can provide him with an estimate of his OOP cost.  he was given the contact information for the laboratory if he has further questions. Marland Kitchen   PLAN: After considering the risks, benefits, and limitations, Mr. Jolliff  provided informed consent to pursue genetic testing and the blood sample will be taking at his next lab tomorrow 07/01/2018.  It will be sent to [facility] for analysis of the Common Hereditary Cancers Panel. Results should be available within approximately 2-3 weeks' time, at which point they will be disclosed by telephone to Mr. Rousseau, as will any additional recommendations warranted by these results. Mr. Overbeck will receive a summary of his genetic counseling visit and a copy of his results once available. This information will also be available in Epic. We encouraged Mr. Northup to remain in contact with cancer genetics annually so that we can continuously update the family history and inform him of any changes in cancer genetics and testing that may be of benefit for his family. Mr. Mirsky questions were answered to his satisfaction today. Our contact information was provided should additional questions or concerns arise.  Based on Mr. Mehring family history, we recommended his relatives who are affected with cancer also, have genetic counseling and testing. Mr. Kreiser will let us know if we can be of any assistance in coordinating genetic counseling and/or testing for this family member.   Lastly, we encouraged Mr. Belisle to remain in contact with  cancer genetics annually so that we can continuously update the family history and inform him of any changes in cancer genetics and testing that may be of benefit for this family.   Mr.  Folkert questions were answered to his satisfaction today. Our contact information was provided should additional questions or concerns arise. Thank you for the referral and allowing [place] to share in the care of your patient.   Tana Felts, MS, [facility] Certified Genetic Counselor [email or web address] phone: [phone]  The patient was seen for a total of 30 minutes in face-to-face genetic counseling.  The patient was accompanied today by his wife. Dr. Grayland Ormond was available for questions regarding this case.

## 2018-07-01 ENCOUNTER — Inpatient Hospital Stay: Payer: Medicare Other

## 2018-07-01 ENCOUNTER — Inpatient Hospital Stay (HOSPITAL_BASED_OUTPATIENT_CLINIC_OR_DEPARTMENT_OTHER): Payer: Medicare Other | Admitting: Oncology

## 2018-07-01 ENCOUNTER — Other Ambulatory Visit: Payer: Self-pay

## 2018-07-01 ENCOUNTER — Encounter: Payer: Self-pay | Admitting: Oncology

## 2018-07-01 VITALS — BP 102/66 | HR 79 | Temp 96.7°F | Resp 18 | Wt 165.4 lb

## 2018-07-01 DIAGNOSIS — Z803 Family history of malignant neoplasm of breast: Secondary | ICD-10-CM

## 2018-07-01 DIAGNOSIS — Z1501 Genetic susceptibility to malignant neoplasm of breast: Secondary | ICD-10-CM

## 2018-07-01 DIAGNOSIS — Z5111 Encounter for antineoplastic chemotherapy: Secondary | ICD-10-CM

## 2018-07-01 DIAGNOSIS — R5383 Other fatigue: Secondary | ICD-10-CM

## 2018-07-01 DIAGNOSIS — Z8042 Family history of malignant neoplasm of prostate: Secondary | ICD-10-CM

## 2018-07-01 DIAGNOSIS — I2510 Atherosclerotic heart disease of native coronary artery without angina pectoris: Secondary | ICD-10-CM

## 2018-07-01 DIAGNOSIS — R530 Neoplastic (malignant) related fatigue: Secondary | ICD-10-CM | POA: Diagnosis not present

## 2018-07-01 DIAGNOSIS — C7951 Secondary malignant neoplasm of bone: Secondary | ICD-10-CM | POA: Diagnosis not present

## 2018-07-01 DIAGNOSIS — C61 Malignant neoplasm of prostate: Secondary | ICD-10-CM

## 2018-07-01 DIAGNOSIS — Z87891 Personal history of nicotine dependence: Secondary | ICD-10-CM

## 2018-07-01 DIAGNOSIS — R338 Other retention of urine: Secondary | ICD-10-CM

## 2018-07-01 DIAGNOSIS — R634 Abnormal weight loss: Secondary | ICD-10-CM

## 2018-07-01 DIAGNOSIS — K219 Gastro-esophageal reflux disease without esophagitis: Secondary | ICD-10-CM

## 2018-07-01 DIAGNOSIS — Z79899 Other long term (current) drug therapy: Secondary | ICD-10-CM

## 2018-07-01 DIAGNOSIS — Z7982 Long term (current) use of aspirin: Secondary | ICD-10-CM

## 2018-07-01 DIAGNOSIS — M1990 Unspecified osteoarthritis, unspecified site: Secondary | ICD-10-CM

## 2018-07-01 DIAGNOSIS — I10 Essential (primary) hypertension: Secondary | ICD-10-CM

## 2018-07-01 DIAGNOSIS — G893 Neoplasm related pain (acute) (chronic): Secondary | ICD-10-CM | POA: Diagnosis not present

## 2018-07-01 DIAGNOSIS — R401 Stupor: Secondary | ICD-10-CM

## 2018-07-01 LAB — COMPREHENSIVE METABOLIC PANEL
ALT: 14 U/L (ref 0–44)
AST: 27 U/L (ref 15–41)
Albumin: 3.5 g/dL (ref 3.5–5.0)
Alkaline Phosphatase: 40 U/L (ref 38–126)
Anion gap: 8 (ref 5–15)
BUN: 15 mg/dL (ref 8–23)
CHLORIDE: 108 mmol/L (ref 98–111)
CO2: 19 mmol/L — ABNORMAL LOW (ref 22–32)
Calcium: 8.8 mg/dL — ABNORMAL LOW (ref 8.9–10.3)
Creatinine, Ser: 1.06 mg/dL (ref 0.61–1.24)
GFR calc Af Amer: >60 mL/min (ref >60)
GFR calc non Af Amer: >60 mL/min (ref >60)
Glucose, Bld: 107 mg/dL — ABNORMAL HIGH (ref 70–99)
Potassium: 4 mmol/L (ref 3.5–5.1)
Sodium: 135 mmol/L (ref 135–145)
Total Bilirubin: 1 mg/dL (ref 0.3–1.2)
Total Protein: 7.1 g/dL (ref 6.5–8.1)

## 2018-07-01 LAB — CBC WITH DIFFERENTIAL/PLATELET
Abs Immature Granulocytes: 0.04 10*3/uL (ref 0.00–0.07)
BASOS PCT: 0 %
Basophils Absolute: 0 10*3/uL (ref 0.0–0.1)
Eosinophils Absolute: 0 10*3/uL (ref 0.0–0.5)
Eosinophils Relative: 1 %
HCT: 25 % — ABNORMAL LOW (ref 39.0–52.0)
Hemoglobin: 8.4 g/dL — ABNORMAL LOW (ref 13.0–17.0)
Immature Granulocytes: 1 %
Lymphocytes Relative: 22 %
Lymphs Abs: 0.6 10*3/uL — ABNORMAL LOW (ref 0.7–4.0)
MCH: 28.1 pg (ref 26.0–34.0)
MCHC: 33.6 g/dL (ref 30.0–36.0)
MCV: 83.6 fL (ref 80.0–100.0)
Monocytes Absolute: 0.5 10*3/uL (ref 0.1–1.0)
Monocytes Relative: 18 %
NEUTROS PCT: 58 %
Neutro Abs: 1.7 10*3/uL (ref 1.7–7.7)
Platelets: 138 10*3/uL — ABNORMAL LOW (ref 150–400)
RBC: 2.99 MIL/uL — ABNORMAL LOW (ref 4.22–5.81)
RDW: 15.3 % (ref 11.5–15.5)
WBC: 2.8 10*3/uL — ABNORMAL LOW (ref 4.0–10.5)
nRBC: 0 % (ref 0.0–0.2)

## 2018-07-01 LAB — PSA: Prostatic Specific Antigen: 196 ng/mL — ABNORMAL HIGH (ref 0.00–4.00)

## 2018-07-01 MED ORDER — SODIUM CHLORIDE 0.9 % IV SOLN
Freq: Once | INTRAVENOUS | Status: AC
  Administered 2018-07-01: 09:00:00 via INTRAVENOUS
  Filled 2018-07-01: qty 250

## 2018-07-01 MED ORDER — HEPARIN SOD (PORK) LOCK FLUSH 100 UNIT/ML IV SOLN
500.0000 [IU] | Freq: Once | INTRAVENOUS | Status: AC | PRN
  Administered 2018-07-01: 500 [IU]
  Filled 2018-07-01: qty 5

## 2018-07-01 MED ORDER — DEXAMETHASONE SODIUM PHOSPHATE 10 MG/ML IJ SOLN
10.0000 mg | Freq: Once | INTRAMUSCULAR | Status: AC
  Administered 2018-07-01: 10 mg via INTRAVENOUS
  Filled 2018-07-01: qty 1

## 2018-07-01 MED ORDER — SODIUM CHLORIDE 0.9% FLUSH
10.0000 mL | INTRAVENOUS | Status: DC | PRN
  Administered 2018-07-01: 10 mL
  Filled 2018-07-01: qty 10

## 2018-07-01 MED ORDER — SODIUM CHLORIDE 0.9 % IV SOLN
36.0000 mg/m2 | Freq: Once | INTRAVENOUS | Status: AC
  Administered 2018-07-01: 70 mg via INTRAVENOUS
  Filled 2018-07-01: qty 7

## 2018-07-01 MED ORDER — DENOSUMAB 120 MG/1.7ML ~~LOC~~ SOLN
120.0000 mg | Freq: Once | SUBCUTANEOUS | Status: AC
  Administered 2018-07-01: 120 mg via SUBCUTANEOUS
  Filled 2018-07-01: qty 1.7

## 2018-07-01 NOTE — Progress Notes (Signed)
Patient here for follow up. States that pain is "getting better."

## 2018-07-01 NOTE — Progress Notes (Signed)
Hematology/Oncology  Follow up note [facility] [phone] [phone]  Patient Care Team: [place] Pink, MD as PCP - General (Family Medicine) Earlie Server, MD as Medical Oncologist (Medical Oncology)  REASON FOR VISIT Follow up for antineoplasm treatment of Prostate Cancer  HISTORY OF PRESENTING ILLNESS:  Matthew Hunter 85 y.o.  male with PMH listed as below who was referred by [facility] Provider [name] to me for evaluation of clinical prostate cancer and management.  He has a history of elevated PSA and BPH. PSA trends as follows: 11/14/2014 4.3, 11/07/2015 7.1, 05/11/2016 10.4, 06/11/2016 10.6, 07/07/2016 7.8, 01/06/2017 86.8.  He was seen by urology and was diagnosed with clinical prostate cancer given the extreme elevation of PSA and prostate nodule. Biopsy was not pursued. He was treated with [name] on 01/29/2017,  # prostate biopsy on 03/10/2017. 5 out of 6 cores positive for prostate cancer, with highest gleason score group 9 (4+5), + perineural invasion.  Stage IV prostate cancer, multiple osseus metastatic disease. No visceral involvement.   01/18/2018  CT scan image was independently reviewed by me and discussed with patient.  Patient has progression of bone metastatic disease only  Current treatment  Patient got first [place] on 01/29/2018, switched to leupron on 04/13/2015,  Q6 months.  .Testosterone level at 14, castration level.  Upfront Gillermina Phy was added on December 2019 to assist fast disease control (phase II trial [place] B, [facility] Oncol. 2014) S/p palliative RT to lumber spine and SI joints. Xtandi discontinued on due to CNS toxicity in April 2019.  12/09/17 Abiaterone discontinued.  7/ 18/2019 Start Apalutimide '240mg'$  daily.   Trend of tumor marker  PSA 86.8 -->64.78 -->131--> 242 added Xtanti --> 17.74-->1.82 Xtandi stopped due to CNS toxicity in April 2019. --> 1.79- (added Abiaterone)-> 11.06-->24.53--> 39.48  [Abiaterone stopped, Added Apalutimid]--> 65.71-->88.63--> Xofigo treatment--> 215--> Back on Xtandi reduced dose.   12/09/17 Abiaterone discontinued due to rising PSA and disease progression. 12/16/2017 Start Apalutimide '240mg'$  daily. Held on 01/13/2018 due to rising PSA, also causing patient to feel dizziness/black out.  Started Xofigo treatment on 02/23/2018. 02/28/2018, back on Xtandi reduced dose '120mg'$ . Xtandi stopped Dec.2020 due to   05/20/2018 Weekly Docetaxol 2 weeks on one week off.   INTERVAL HISTORY Patient presents to follow up for management of metastatic prostate cancer and follow-up on his metastatic prostate cancer related symptoms, and evaluation prior to chemotherapy treatment.  #Status post weekly docetaxel x3, He had one week chemo-break as he is getting palliative radiation.  Status post 5 treatment of Xofigo.  #Reports continue to feeling tired.  Appetite is not good. Patient has lasted 7 pounds since last visit 2 weeks ago He is not sure if he is taking dexamethasone and Megace or not. Says" I do not know since him taking so many medications"  #Bone pain has improved.  He has received palliative radiation.  On fentanyl patch 75 MCG every 72 hours and oxycodone 10 mg every 6 hours as needed. #Diarrhea has completely resolved.  He finished a course of vancomycin.   Review of Systems  Constitutional: Positive for appetite change and fatigue. Negative for chills, fever and unexpected weight change.  HENT:   Negative for hearing loss and voice change.   Eyes: Negative for eye problems and icterus.  Respiratory: Negative for chest tightness, cough and shortness of breath.   Cardiovascular: Negative for chest pain and leg swelling.  Gastrointestinal: Negative for abdominal distention, abdominal pain and diarrhea.  Endocrine: Negative for  hot flashes.  Genitourinary: Negative for difficulty urinating, dysuria and frequency.   Musculoskeletal: Positive for arthralgias and  back pain.  Skin: Negative for itching and rash.  Neurological: Negative for light-headedness and numbness.  Hematological: Negative for adenopathy. Does not bruise/bleed easily.  Psychiatric/Behavioral: Negative for confusion.    MEDICAL HISTORY:  Past Medical History:  Diagnosis Date  . Arthritis   . Benign enlargement of prostate   . CAD (coronary artery disease)   . Constipation   . Elevated PSA   . Family history of breast cancer   . Family history of prostate cancer   . GERD (gastroesophageal reflux disease)   . Hypertension   . Incomplete bladder emptying   . Nocturia   . Prostate cancer ([place]) 05/03/2017   Rad tx's and Lupron shots.   . Urgency of micturation   . Urinary frequency     SURGICAL HISTORY: Past Surgical History:  Procedure Laterality Date  . GALLBLADDER SURGERY    . PORTA CATH INSERTION N/A 06/07/2018   Procedure: PORTA CATH INSERTION;  Surgeon: Katha Cabal, MD;  Location: [place] [facility];  Service: Cardiovascular;  Laterality: N/A;  . PROSTATE SURGERY    . STOMACH SURGERY      SOCIAL HISTORY: Social History   Socioeconomic History  . Marital status: Married    Spouse name: Not on file  . Number of children: Not on file  . Years of education: Not on file  . Highest education level: Not on file  Occupational History  . Not on file  Social Needs  . Financial resource strain: Not on file  . Food insecurity:    Worry: Not on file    Inability: Not on file  . Transportation needs:    Medical: Not on file    Non-medical: Not on file  Tobacco Use  . Smoking status: Former Smoker    Last attempt to quit: 06/01/1998    Years since quitting: 20.0  . Smokeless tobacco: Never Used  Substance and Sexual Activity  . Alcohol use: No    Alcohol/week: 0.0 standard drinks  . Drug use: Not Currently    Frequency: 2.0 times per week    Types: Marijuana    Comment: marijuana two time monthly gives him an appetite  . Sexual activity: Not on  file  Lifestyle  . Physical activity:    Days per week: Not on file    Minutes per session: Not on file  . Stress: Not on file  Relationships  . Social connections:    Talks on phone: Not on file    Gets together: Not on file    Attends religious service: Not on file    Active member of club or organization: Not on file    Attends meetings of clubs or organizations: Not on file    Relationship status: Not on file  . Intimate partner violence:    Fear of current or ex partner: Not on file    Emotionally abused: Not on file    Physically abused: Not on file    Forced sexual activity: Not on file  Other Topics Concern  . Not on file  Social History Narrative  . Not on file    FAMILY HISTORY: Family History  Problem Relation Age of Onset  . Hypertension Mother   . Stroke Father   . Breast cancer Niece        dx  under 20  . Breast cancer Daughter 67  .  Prostate cancer Son 22  . Kidney disease Neg Hx   . Kidney cancer Neg Hx   . Bladder Cancer Neg Hx     ALLERGIES:  has No Known Allergies.  MEDICATIONS:  Current Outpatient Medications  Medication Sig Dispense Refill  . aspirin EC 81 MG tablet Take by mouth.    Marland Kitchen atenolol (TENORMIN) 100 MG tablet Take 100 mg by mouth daily.     . Calcium Carb-Cholecalciferol (CALCIUM-VITAMIN D) 500-200 MG-UNIT tablet Take 1 tablet by mouth daily. 90 tablet 0  . dexamethasone (DECADRON) 4 MG tablet Take 1 tablet (4 mg total) by mouth daily. 60 tablet 0  . fentaNYL (DURAGESIC - DOSED MCG/HR) 25 MCG/HR patch Place 1 patch (25 mcg total) onto the skin every 3 (three) days. 5 patch 0  . fentaNYL (DURAGESIC - DOSED MCG/HR) 50 MCG/HR Place 1 patch (50 mcg total) onto the skin every 3 (three) days. 10 patch 0  . finasteride (PROSCAR) 5 MG tablet Take 1 tablet (5 mg total) by mouth daily. 90 tablet 3  . hydrocortisone 2.5 % cream APP EXT AA BID  0  . lidocaine-prilocaine (EMLA) cream Apply 1 application topically as needed. 30 g 3  . megestrol  (MEGACE) 40 MG tablet Take 2 tablets (80 mg total) by mouth 2 (two) times daily. 120 tablet 0  . mirabegron ER (MYRBETRIQ) 25 MG TB24 tablet Take 1 tablet (25 mg total) by mouth daily. 90 tablet 3  . mirtazapine (REMERON) 15 MG tablet Take 15 mg by mouth at bedtime.  11  . mirtazapine (REMERON) 30 MG tablet Take 30 mg by mouth daily.    . nortriptyline (PAMELOR) 25 MG capsule Take 25 mg by mouth at bedtime. Reported on 05/16/2015  1  . omeprazole (PRILOSEC) 20 MG capsule TAKE 1 CAPSULE(20 MG) BY MOUTH DAILY 30 capsule 4  . ondansetron (ZOFRAN) 8 MG tablet Take 1 tablet (8 mg total) by mouth 2 (two) times daily as needed for refractory nausea / vomiting. 30 tablet 1  . Oxycodone HCl 10 MG TABS Take 1 tablet (10 mg total) by mouth every 6 (six) hours as needed. 60 tablet 0  . polyethylene glycol (MIRALAX / GLYCOLAX) packet Take 17 g by mouth daily. Mix one tablespoon with 8oz of your favorite juice or water every day until you are having soft formed stools. Then start taking once daily if you didn't have a stool the day before. 30 each 0  . prochlorperazine (COMPAZINE) 10 MG tablet Take 1 tablet (10 mg total) by mouth every 6 (six) hours as needed (Nausea or vomiting). 30 tablet 1  . pyridOXINE (VITAMIN B-6) 50 MG tablet Take 50 mg by mouth daily.     Marland Kitchen senna-docusate (SENOKOT-S) 8.6-50 MG tablet Take 2 tablets by mouth daily. 60 tablet 3  . tamsulosin (FLOMAX) 0.4 MG CAPS capsule Take 1 capsule (0.4 mg total) by mouth daily. 90 capsule 3  . Telmisartan-Amlodipine 80-5 MG TABS Take 1 tablet by mouth daily.     Marland Kitchen triamcinolone cream (KENALOG) 0.1 % APP EXT AA BID    . vancomycin (VANCOCIN) 125 MG capsule Take 1 capsule (125 mg total) by mouth 4 (four) times daily. (Patient not taking: Reported on 07/01/2018) 40 capsule 0   No current facility-administered medications for this visit.       Marland Kitchen  PHYSICAL EXAMINATION: ECOG PERFORMANCE STATUS: 1 - Symptomatic but completely ambulatory Vitals:    07/01/18 0841  BP: 102/66  Pulse: 79  Resp: 18  Temp: (!) 96.7 F (35.9 C)   Filed Weights   07/01/18 0841  Weight: 165 lb 6.4 oz (75 kg)   Physical Exam Constitutional:      General: He is not in acute distress.    Appearance: He is not diaphoretic.  HENT:     Head: Normocephalic and atraumatic.     Mouth/Throat:     Pharynx: No oropharyngeal exudate.  Eyes:     General: No scleral icterus.    Conjunctiva/sclera: Conjunctivae normal.     Pupils: Pupils are equal, round, and reactive to light.  Neck:     Musculoskeletal: Normal range of motion and neck supple.     Thyroid: No thyromegaly.  Cardiovascular:     Rate and Rhythm: Normal rate and regular rhythm.     Heart sounds: Normal heart sounds. No murmur.  Pulmonary:     Effort: Pulmonary effort is normal. No respiratory distress.     Breath sounds: Normal breath sounds. No wheezing.  Abdominal:     General: Bowel sounds are normal. There is no distension.     Palpations: Abdomen is soft. There is no mass.     Tenderness: There is no abdominal tenderness.  Musculoskeletal: Normal range of motion.        General: No deformity.  Skin:    General: Skin is warm and dry.     Findings: No erythema or rash.  Neurological:     Mental Status: He is alert and oriented to person, place, and time.     Cranial Nerves: No cranial nerve deficit.     Coordination: Coordination normal.  Psychiatric:        Behavior: Behavior normal.        Thought Content: Thought content normal.    LABORATORY DATA:  I have reviewed the data as listed Lab Results  Component Value Date   WBC 2.8 (L) 07/01/2018   HGB 8.4 (L) 07/01/2018   HCT 25.0 (L) 07/01/2018   MCV 83.6 07/01/2018   PLT 138 (L) 07/01/2018   Recent Labs    06/09/18 0944 06/17/18 0810 07/01/18 0812  NA 139 138 135  K 3.8 3.7 4.0  CL 109 110 108  CO2 21* 20* 19*  GLUCOSE 107* 118* 107*  BUN '10 14 15  '$ CREATININE 1.00 0.81 1.06  CALCIUM 8.7* 9.3 8.8*  GFRNONAA >60  >60 >60  GFRAA >60 >60 >60  PROT 7.3 7.4 7.1  ALBUMIN 3.8 3.5 3.5  AST '31 27 27  '$ ALT '15 14 14  '$ ALKPHOS 36* 35* 40  BILITOT 0.9 0.5 1.0   RADIOGRAPHIC STUDIES: I have personally reviewed the radiological images as listed and agreed with the findings in the report. 12/15/2017 Bone scan whole body showed new sites of osseous tracer accumulation are identified especially to size at the distal left femoral diaphysis, L2 vertebral body, right iliac bone, right ischium. 01/18/2018 chest abdomen pelvis with contrast Extensive metastasis disease to bone, with all bone lesions new from the abdomen and pelvis CT dated 9/5 2018 and a chest CT dated 06/19/2013.  Involvement is similar to the recent bone scan.  No other evidence of metastatic disease.  No acute abnormalities in the chest abdomen and pelvis.  03/14/2018 CT chest Angio PE protocol 1. No acute cardiopulmonary abnormalities. No evidence for acute pulmonary embolus 2. Coronary artery atherosclerotic calcifications 3.  Aortic Atherosclerosis (ICD10-I70.0). 4. Sclerotic bone metastasis as noted previously  03/25/2018 CT abdomen pelvis with contrast  1. No acute abnormality  in the abdomen or pelvis. 2. Extensive metastatic bone disease. Findings are similar to theexam on 01/18/2018.  3. Nonobstructive left kidney stone. 4. Stable left adrenal nodule.  ASSESSMENT & PLAN: 85 yo male follows up for management of metastatic prostate cancer and neoplasm related symptoms..  1. Prostate cancer ([place])   2. Bone metastasis ([place])   3. Neoplasm related pain   4. Encounter for antineoplastic chemotherapy   5. Weight loss   6. 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  Status post 5 out of 6 planned [facility] treatment.  Status post 2 doses of weekly docetaxel.  Labs are reviewed and discussed with patient.  Counts are acceptable to proceed with today's docetaxel  treatment.  #ADT will be given.  Urologist office. #Weight loss, supposed to take appetite stimulant with dexamethasone 4 mg daily and Megace.  He is not sure whether he is taking.  I asked patient to bring his medication at next visit. Continue follow-up with dietitian.Marland Kitchen #Neoplasm related pain.  Continue current regimen with fentanyl patch 75 MCG every 72 hours, oxycodone 10 mg every 6 hours as needed.  Reports pain is better controlled.  #Somatic BRCA1 mutation positive.  Patient has been referred to genetic counseling PARP  inhibitors can be considered as the next line of treatment.  Combination with Trudi Ida has not been well studied. Patient rescheduled his genetic counselor appointment.  Return of visit: Labs CBC CMP in 1 week, MD assessment prior to next docetaxel treatment.  We spent sufficient time to discuss many aspect of care, questions were answered to patient's satisfaction. Total face to face encounter time for this patient visit was 25 min. >50% of the time was  spent in counseling and coordination of care.    Earlie Server, MD, PhD Hematology Oncology [facility] Pager- [phone] 07/01/2018

## 2018-07-04 ENCOUNTER — Other Ambulatory Visit: Payer: Self-pay

## 2018-07-04 ENCOUNTER — Emergency Department
Admission: EM | Admit: 2018-07-04 | Discharge: 2018-07-04 | Disposition: A | Discharge status: Home / Self Care | Payer: Medicare Other | Attending: Emergency Medicine | Admitting: Emergency Medicine

## 2018-07-04 ENCOUNTER — Emergency Department: Payer: Medicare Other

## 2018-07-04 DIAGNOSIS — R634 Abnormal weight loss: Secondary | ICD-10-CM | POA: Diagnosis not present

## 2018-07-04 DIAGNOSIS — I10 Essential (primary) hypertension: Secondary | ICD-10-CM | POA: Diagnosis not present

## 2018-07-04 DIAGNOSIS — R1319 Other dysphagia: Secondary | ICD-10-CM | POA: Diagnosis not present

## 2018-07-04 DIAGNOSIS — Z87891 Personal history of nicotine dependence: Secondary | ICD-10-CM | POA: Diagnosis not present

## 2018-07-04 DIAGNOSIS — C61 Malignant neoplasm of prostate: Secondary | ICD-10-CM | POA: Insufficient documentation

## 2018-07-04 DIAGNOSIS — Z923 Personal history of irradiation: Secondary | ICD-10-CM | POA: Insufficient documentation

## 2018-07-04 DIAGNOSIS — F1210 Cannabis abuse, uncomplicated: Secondary | ICD-10-CM | POA: Diagnosis not present

## 2018-07-04 DIAGNOSIS — Z79899 Other long term (current) drug therapy: Secondary | ICD-10-CM | POA: Diagnosis not present

## 2018-07-04 DIAGNOSIS — Z7982 Long term (current) use of aspirin: Secondary | ICD-10-CM | POA: Diagnosis not present

## 2018-07-04 DIAGNOSIS — Z9221 Personal history of antineoplastic chemotherapy: Secondary | ICD-10-CM | POA: Diagnosis not present

## 2018-07-04 DIAGNOSIS — R4702 Dysphasia: Secondary | ICD-10-CM | POA: Diagnosis present

## 2018-07-04 LAB — BASIC METABOLIC PANEL
Anion gap: 7 (ref 5–15)
BUN: 19 mg/dL (ref 8–23)
CO2: 24 mmol/L (ref 22–32)
Calcium: 9.4 mg/dL (ref 8.9–10.3)
Chloride: 103 mmol/L (ref 98–111)
Creatinine, Ser: 1.15 mg/dL (ref 0.61–1.24)
GFR calc Af Amer: >60 mL/min (ref >60)
GFR calc non Af Amer: 57 mL/min — ABNORMAL LOW (ref >60)
Glucose, Bld: 115 mg/dL — ABNORMAL HIGH (ref 70–99)
POTASSIUM: 4.7 mmol/L (ref 3.5–5.1)
Sodium: 134 mmol/L — ABNORMAL LOW (ref 135–145)

## 2018-07-04 LAB — CBC WITH DIFFERENTIAL/PLATELET
ABS IMMATURE GRANULOCYTES: 0.02 10*3/uL (ref 0.00–0.07)
Basophils Absolute: 0 10*3/uL (ref 0.0–0.1)
Basophils Relative: 0 %
Eosinophils Absolute: 0 10*3/uL (ref 0.0–0.5)
Eosinophils Relative: 0 %
HCT: 26.8 % — ABNORMAL LOW (ref 39.0–52.0)
Hemoglobin: 8.9 g/dL — ABNORMAL LOW (ref 13.0–17.0)
IMMATURE GRANULOCYTES: 1 %
Lymphocytes Relative: 9 %
Lymphs Abs: 0.4 10*3/uL — ABNORMAL LOW (ref 0.7–4.0)
MCH: 28.1 pg (ref 26.0–34.0)
MCHC: 33.2 g/dL (ref 30.0–36.0)
MCV: 84.5 fL (ref 80.0–100.0)
Monocytes Absolute: 0.1 10*3/uL (ref 0.1–1.0)
Monocytes Relative: 4 %
NEUTROS ABS: 3.3 10*3/uL (ref 1.7–7.7)
NEUTROS PCT: 86 %
NRBC: 0 % (ref 0.0–0.2)
PLATELETS: 135 10*3/uL — AB (ref 150–400)
RBC: 3.17 MIL/uL — ABNORMAL LOW (ref 4.22–5.81)
RDW: 15.1 % (ref 11.5–15.5)
WBC: 3.8 10*3/uL — ABNORMAL LOW (ref 4.0–10.5)

## 2018-07-04 MED ORDER — SODIUM CHLORIDE 0.9 % IV BOLUS
1000.0000 mL | Freq: Once | INTRAVENOUS | Status: AC
  Administered 2018-07-04: 1000 mL via INTRAVENOUS

## 2018-07-04 MED ORDER — NYSTATIN 100000 UNIT/ML MT SUSP: 5.0000 mL | Freq: Four times a day (QID) | OROMUCOSAL | 0 refills | Status: DC

## 2018-07-04 MED ORDER — IOPAMIDOL (ISOVUE-300) INJECTION 61%: 30.0000 mL | Freq: Once | INTRAVENOUS | Status: DC | PRN

## 2018-07-04 MED ORDER — FAMOTIDINE 20 MG PO TABS: 20.0000 mg | ORAL_TABLET | Freq: Every day | ORAL | 0 refills | Status: DC

## 2018-07-04 MED ORDER — IOPAMIDOL (ISOVUE-300) INJECTION 61%
100.0000 mL | Freq: Once | INTRAVENOUS | Status: AC | PRN
  Administered 2018-07-04: 100 mL via INTRAVENOUS

## 2018-07-04 NOTE — ED Triage Notes (Signed)
Pt states he feels like something stuck in his esopahgus x few days. Drank an ensure last night and kept it down. States he's ate some cookies last night as well. A&O, ambulatory. No resp distress noted. Speaking in complete sentences.

## 2018-07-04 NOTE — Discharge Instructions (Signed)
You have any bleeding, inability to tolerate liquids, worsening pain when you eat or other concerns return to the emergency room, otherwise follow closely with your oncologist, Dr. Tasia Catchings, who will help you with this problem.

## 2018-07-04 NOTE — ED Notes (Signed)
No blood work or scans at this time per Dr. Joni Fears.

## 2018-07-04 NOTE — ED Provider Notes (Addendum)
[facility] Emergency Department Provider Note  ____________________________________________   I have reviewed the triage vital signs and the nursing notes. Where available I have reviewed prior notes and, if possible and indicated, outside hospital notes.    HISTORY  Chief Complaint No chief complaint on file.    HPI Deke Tilghman is a 85 y.o. male with a history of GERD, he states he had a Nissen fundoplication years ago, or at least, "the procedure when I brought my stomach up and wrapped around because of reflux" states he has had progressive dysphasia over the last several weeks which is getting worse.  He states that as of yesterday feels he cannot really swallow anything and is getting in his esophagus at the point where it enters the stomach.  He states that he has had some Ensure, the last solid food he had he thinks was yesterday with cereal.  He is not sure if he took any of his medications today.  He is also on chemotherapy and has had radiation for prostate cancer with metastatic lesions to the bones.  The patient denies any ongoing pain unless he swallows he has no exertional symptoms he has no shortness of breath no pleuritic symptoms.  He denies any fever or chills.  He states his bowel movements are "okay".  Somewhat limited history.  At his baseline according to family.  He states that he has not had any thrush in his mouth and sometimes he does seem to cough up some "white stuff" he blows his nose    Past Medical History:  Diagnosis Date  . Arthritis   . Benign enlargement of prostate   . CAD (coronary artery disease)   . Constipation   . Elevated PSA   . Family history of breast cancer   . Family history of prostate cancer   . GERD (gastroesophageal reflux disease)   . Hypertension   . Incomplete bladder emptying   . Nocturia   . Prostate cancer ([place]) 05/03/2017   Rad tx's and Lupron shots.   . Urgency of micturation   . Urinary  frequency     Patient Active Problem List   Diagnosis Date Noted  . Family history of breast cancer   . Family history of prostate cancer   . 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 Goals of care, counseling/discussion 12/17/2017  . Prostate cancer ([place]) 05/03/2017  . Arthritis 06/11/2016  . History of alcohol abuse 06/11/2016  . Hx of colonic polyp 06/11/2016  . Hypertension 06/11/2016  . Internal hemorrhoids 06/11/2016  . Elevated PSA 05/16/2015  . BPH (benign prostatic hyperplasia) 11/22/2014  . Incomplete bladder emptying 11/22/2014  . GERD (gastroesophageal reflux disease) 05/09/2008    Past Surgical History:  Procedure Laterality Date  . GALLBLADDER SURGERY    . PORTA CATH INSERTION N/A 06/07/2018   Procedure: PORTA CATH INSERTION;  Surgeon: Katha Cabal, MD;  Location: [facility];  Service: Cardiovascular;  Laterality: N/A;  . PROSTATE SURGERY    . STOMACH SURGERY      Prior to Admission medications   Medication Sig Start Date End Date Taking? Authorizing Provider  aspirin EC 81 MG tablet Take by mouth.    [provider]  atenolol (TENORMIN) 100 MG tablet Take 100 mg by mouth daily.     [provider]  Calcium Carb-Cholecalciferol (CALCIUM-VITAMIN D) 500-200 MG-UNIT tablet Take 1 tablet by mouth daily. 12/30/17   Earlie Server, MD  dexamethasone (DECADRON) 4 MG tablet  Take 1 tablet (4 mg total) by mouth daily. 06/03/18   Earlie Server, MD  fentaNYL (DURAGESIC - DOSED MCG/HR) 25 MCG/HR patch Place 1 patch (25 mcg total) onto the skin every 3 (three) days. 06/03/18   Earlie Server, MD  fentaNYL (DURAGESIC - DOSED MCG/HR) 50 MCG/HR Place 1 patch (50 mcg total) onto the skin every 3 (three) days. 05/20/18   Earlie Server, MD  finasteride (PROSCAR) 5 MG tablet Take 1 tablet (5 mg total) by mouth daily. 06/11/16   [name] A, PA-C  hydrocortisone 2.5 % cream APP EXT AA BID 09/02/17   [provider]  lidocaine-prilocaine (EMLA) cream Apply 1  application topically as needed. 06/17/18   Earlie Server, MD  megestrol (MEGACE) 40 MG tablet Take 2 tablets (80 mg total) by mouth 2 (two) times daily. 03/22/18   Earlie Server, MD  mirabegron ER (MYRBETRIQ) 25 MG TB24 tablet Take 1 tablet (25 mg total) by mouth daily. 05/04/17   [name] A, PA-C  mirtazapine (REMERON) 15 MG tablet Take 15 mg by mouth at bedtime. 03/31/18   [provider]  mirtazapine (REMERON) 30 MG tablet Take 30 mg by mouth daily. 05/09/18   [provider]  nortriptyline (PAMELOR) 25 MG capsule Take 25 mg by mouth at bedtime. Reported on 05/16/2015 10/30/14   [provider]  omeprazole (PRILOSEC) 20 MG capsule TAKE 1 CAPSULE(20 MG) BY MOUTH DAILY 06/23/18   Earlie Server, MD  ondansetron (ZOFRAN) 8 MG tablet Take 1 tablet (8 mg total) by mouth 2 (two) times daily as needed for refractory nausea / vomiting. 05/12/18   Earlie Server, MD  Oxycodone HCl 10 MG TABS Take 1 tablet (10 mg total) by mouth every 6 (six) hours as needed. 06/17/18   Earlie Server, MD  polyethylene glycol [employer] [facility] / Floria Raveling) packet Take 17 g by mouth daily. Mix one tablespoon with 8oz of your favorite juice or water every day until you are having soft formed stools. Then start taking once daily if you didn't have a stool the day before. 02/26/18   Merlyn Lot, MD  prochlorperazine (COMPAZINE) 10 MG tablet Take 1 tablet (10 mg total) by mouth every 6 (six) hours as needed (Nausea or vomiting). 05/12/18   Earlie Server, MD  pyridOXINE (VITAMIN B-6) 50 MG tablet Take 50 mg by mouth daily.     [provider]  senna-docusate (SENOKOT-S) 8.6-50 MG tablet Take 2 tablets by mouth daily. 04/12/18   Earlie Server, MD  tamsulosin (FLOMAX) 0.4 MG CAPS capsule Take 1 capsule (0.4 mg total) by mouth daily. 07/19/17   McGowan, Larene Beach A, PA-C  Telmisartan-Amlodipine 80-5 MG TABS Take 1 tablet by mouth daily.     [provider]  triamcinolone cream (KENALOG) 0.1 % APP EXT AA BID 05/09/18   [provider]  vancomycin (VANCOCIN) 125 MG capsule Take 1 capsule (125 mg total) by mouth 4 (four) times daily. Patient not taking: Reported on 07/01/2018 06/06/18   Earlie Server, MD    Allergies Patient has no known allergies.  Family History  Problem Relation Age of Onset  . Hypertension Mother   . Stroke Father   . Breast cancer Niece        dx  under 58  . Breast cancer Daughter 9  . Prostate cancer Son 38  . Kidney disease Neg Hx   . Kidney cancer Neg Hx   . Bladder Cancer Neg Hx     Social History Social History  Tobacco Use  . Smoking status: Former Smoker    Last attempt to quit: 06/01/1998    Years since quitting: 20.1  . Smokeless tobacco: Never Used  Substance Use Topics  . Alcohol use: No    Alcohol/week: 0.0 standard drinks  . Drug use: Not Currently    Frequency: 2.0 times per week    Types: Marijuana    Comment: marijuana two time monthly gives him an appetite    Review of Systems Constitutional: No fever/chills Eyes: No visual changes. ENT: No sore throat. No stiff neck no neck pain Cardiovascular: Denies chest pain. Respiratory: Denies shortness of breath. Gastrointestinal:   no vomiting.  No diarrhea.  No constipation. Genitourinary: Negative for dysuria. Musculoskeletal: Negative lower extremity swelling Skin: Negative for rash. Neurological: Negative for severe headaches, focal weakness or numbness.   ____________________________________________   PHYSICAL EXAM:  VITAL SIGNS: ED Triage Vitals  Enc Vitals Group     BP 07/04/18 1418 (!) 107/55     Pulse Rate 07/04/18 1418 88     Resp 07/04/18 1418 18     Temp 07/04/18 1418 97.9 F (36.6 C)     Temp Source 07/04/18 1418 Oral     SpO2 07/04/18 1418 100 %     Weight 07/04/18 1418 160 lb (72.6 kg)     Height 07/04/18 1418 5\' 9"  (1.753 m)     Head Circumference --      Peak Flow --      Pain Score 07/04/18 1422 7     Pain Loc --      Pain Edu? --      Excl. in [place]? --      Constitutional: Alert and oriented. Well appearing and in no acute distress. Eyes: Conjunctivae are normal Head: Atraumatic HEENT: No congestion/rhinnorhea. Mucous membranes are moist.  Oropharynx non-erythematous Neck:   Nontender with no meningismus, no masses, no stridor Cardiovascular: Normal rate, regular rhythm. Grossly normal heart sounds.  Good peripheral circulation. Respiratory: Normal respiratory effort.  No retractions. Lungs CTAB. Abdominal: Soft and nontender. No distention. No guarding no rebound Back:  There is no focal tenderness or step off.  there is no midline tenderness there are no lesions noted. there is no CVA tenderness Musculoskeletal: No lower extremity tenderness, no upper extremity tenderness. No joint effusions, no DVT signs strong distal pulses no edema Neurologic:  Normal speech and language. No gross focal neurologic deficits are appreciated.  Skin:  Skin is warm, dry and intact. No rash noted. Psychiatric: Mood and affect are normal. Speech and behavior are normal.  ____________________________________________   LABS (all labs ordered are listed, but only abnormal results are displayed)  Labs Reviewed  CBC WITH DIFFERENTIAL/PLATELET  BASIC METABOLIC PANEL    Pertinent labs  results that were available during my care of the patient were reviewed by me and considered in my medical decision making (see chart for details). ____________________________________________  EKG  I personally interpreted any EKGs ordered by me or triage  ____________________________________________  RADIOLOGY  Pertinent labs & imaging results that were available during my care of the patient were reviewed by me and considered in my medical decision making (see chart for details). If possible, patient and/or family made aware of any abnormal findings.  No results found. ____________________________________________    PROCEDURES  Procedure(s) performed:  None  Procedures  Critical Care performed: None  ____________________________________________   INITIAL IMPRESSION / ASSESSMENT AND PLAN / ED COURSE  Pertinent labs & imaging results that were  available during my care of the patient were reviewed by me and considered in my medical decision making (see chart for details).  Patient presents today complaining of progressive dysphasia culminating in a sensation that he has a blockage.  He is handling his secretions well, therefore I doubt is a complete blockage.  Differential does include thrush from his chemotherapy although I do not see any significant thrush in his mouth, it also includes complications of his [employer] fundoplication which I assume is a surgery he describes to me, and also includes stenosis, and mass is also considered.  Patient did have a negative CT of this area in October, 4 months ago so I have low suspicion of a large mass but is certainly not impossible.  Given that he has cancer, is on chemotherapy, has had progressive weight loss which could be attributable to cancer or attributable to what ever is going on in his esophagus, we will obtain CT scan.  ----------------------------------------- 6:48 PM on 07/04/2018 ----------------------------------------- Skin unremarkable patient able to drink contrast with no difficulty no evidence of actual obstruction, he has a progressive dysphagia for several months, no evidence of significant dehydration, I talked to Dr. Tasia Catchings, his oncologist, she is well aware of this problem has been following as an outpatient.  She does agree with nystatin swish and swallow as a precaution against possible candidal infection although there is no evidence of it seen clinically at this time, and antiacid's, she will follow-up with him closely as an outpatient.  She states that she will refer him to GI if she feels it is necessary but she is already working on this problem.  Patient and family are very  satisfied with this and they would like to go he has no ongoing discomfort in his abdomen is being I am.  Patient tolerating liquids at his baseline and solid food is always been hard for him over the last several months.  Extensive return precautions follow-up given understood    ____________________________________________   FINAL CLINICAL IMPRESSION(S) / ED DIAGNOSES  Final diagnoses:  None      This chart was dictated using voice recognition software.  Despite best efforts to proofread,  errors can occur which can change meaning.      Schuyler Amor, MD 07/04/18 1633    Schuyler Amor, MD 07/04/18 [id number]

## 2018-07-04 NOTE — ED Notes (Signed)
Pt verbalized understanding of d/c instructions, rx, and f/u care. No further questions at this time. Pt ambulatory to the exit with steady gait accompanied bu wife.

## 2018-07-04 NOTE — ED Triage Notes (Signed)
First Nurse Note:  States has something stuck in esophagus -- mid thoracic.  States unable to keep anything down.  States symptoms began yesterday.  Patient is AAOx3.  Skin warm and dry.  Voice clear and strong.  Respirations regular and non labored.

## 2018-07-07 ENCOUNTER — Inpatient Hospital Stay: Payer: Medicare Other | Attending: Oncology

## 2018-07-07 ENCOUNTER — Encounter: Payer: Self-pay | Admitting: *Deleted

## 2018-07-07 DIAGNOSIS — Z8042 Family history of malignant neoplasm of prostate: Secondary | ICD-10-CM | POA: Insufficient documentation

## 2018-07-07 DIAGNOSIS — C61 Malignant neoplasm of prostate: Secondary | ICD-10-CM | POA: Insufficient documentation

## 2018-07-07 DIAGNOSIS — Z5111 Encounter for antineoplastic chemotherapy: Secondary | ICD-10-CM | POA: Diagnosis not present

## 2018-07-07 DIAGNOSIS — Z7982 Long term (current) use of aspirin: Secondary | ICD-10-CM | POA: Diagnosis not present

## 2018-07-07 DIAGNOSIS — D649 Anemia, unspecified: Secondary | ICD-10-CM | POA: Insufficient documentation

## 2018-07-07 DIAGNOSIS — Z79899 Other long term (current) drug therapy: Secondary | ICD-10-CM | POA: Diagnosis not present

## 2018-07-07 DIAGNOSIS — C7951 Secondary malignant neoplasm of bone: Secondary | ICD-10-CM | POA: Diagnosis not present

## 2018-07-07 DIAGNOSIS — R410 Disorientation, unspecified: Secondary | ICD-10-CM | POA: Insufficient documentation

## 2018-07-07 DIAGNOSIS — R634 Abnormal weight loss: Secondary | ICD-10-CM | POA: Insufficient documentation

## 2018-07-07 DIAGNOSIS — Z87891 Personal history of nicotine dependence: Secondary | ICD-10-CM | POA: Insufficient documentation

## 2018-07-07 DIAGNOSIS — G893 Neoplasm related pain (acute) (chronic): Secondary | ICD-10-CM | POA: Diagnosis not present

## 2018-07-07 DIAGNOSIS — Z803 Family history of malignant neoplasm of breast: Secondary | ICD-10-CM | POA: Diagnosis not present

## 2018-07-07 LAB — COMPREHENSIVE METABOLIC PANEL
ALT: 19 U/L (ref 0–44)
ANION GAP: 7 (ref 5–15)
AST: 33 U/L (ref 15–41)
Albumin: 3.5 g/dL (ref 3.5–5.0)
Alkaline Phosphatase: 36 U/L — ABNORMAL LOW (ref 38–126)
BUN: 16 mg/dL (ref 8–23)
CO2: 21 mmol/L — ABNORMAL LOW (ref 22–32)
Calcium: 8.4 mg/dL — ABNORMAL LOW (ref 8.9–10.3)
Chloride: 106 mmol/L (ref 98–111)
Creatinine, Ser: 0.97 mg/dL (ref 0.61–1.24)
GFR calc Af Amer: >60 mL/min (ref >60)
GFR calc non Af Amer: >60 mL/min (ref >60)
Glucose, Bld: 119 mg/dL — ABNORMAL HIGH (ref 70–99)
POTASSIUM: 3.6 mmol/L (ref 3.5–5.1)
Sodium: 134 mmol/L — ABNORMAL LOW (ref 135–145)
Total Bilirubin: 0.6 mg/dL (ref 0.3–1.2)
Total Protein: 6.9 g/dL (ref 6.5–8.1)

## 2018-07-07 LAB — CBC WITH DIFFERENTIAL/PLATELET
Abs Immature Granulocytes: 0.04 10*3/uL (ref 0.00–0.07)
BASOS ABS: 0 10*3/uL (ref 0.0–0.1)
Basophils Relative: 1 %
Eosinophils Absolute: 0 10*3/uL (ref 0.0–0.5)
Eosinophils Relative: 0 %
HCT: 23.7 % — ABNORMAL LOW (ref 39.0–52.0)
Hemoglobin: 7.9 g/dL — ABNORMAL LOW (ref 13.0–17.0)
Immature Granulocytes: 1 %
Lymphocytes Relative: 13 %
Lymphs Abs: 0.6 10*3/uL — ABNORMAL LOW (ref 0.7–4.0)
MCH: 28.3 pg (ref 26.0–34.0)
MCHC: 33.3 g/dL (ref 30.0–36.0)
MCV: 84.9 fL (ref 80.0–100.0)
Monocytes Absolute: 0.2 10*3/uL (ref 0.1–1.0)
Monocytes Relative: 4 %
Neutro Abs: 3.4 10*3/uL (ref 1.7–7.7)
Neutrophils Relative %: 81 %
Platelets: 135 10*3/uL — ABNORMAL LOW (ref 150–400)
RBC: 2.79 MIL/uL — ABNORMAL LOW (ref 4.22–5.81)
RDW: 15.3 % (ref 11.5–15.5)
WBC: 4.1 10*3/uL (ref 4.0–10.5)
nRBC: 0 % (ref 0.0–0.2)

## 2018-07-08 ENCOUNTER — Inpatient Hospital Stay: Payer: Medicare Other

## 2018-07-08 ENCOUNTER — Encounter: Payer: Self-pay | Admitting: Oncology

## 2018-07-08 ENCOUNTER — Other Ambulatory Visit: Payer: Self-pay | Admitting: Oncology

## 2018-07-08 ENCOUNTER — Other Ambulatory Visit: Payer: Self-pay

## 2018-07-08 ENCOUNTER — Inpatient Hospital Stay (HOSPITAL_BASED_OUTPATIENT_CLINIC_OR_DEPARTMENT_OTHER): Payer: Medicare Other | Admitting: Oncology

## 2018-07-08 VITALS — BP 98/58 | HR 94 | Temp 97.2°F | Resp 18 | Wt 168.4 lb

## 2018-07-08 DIAGNOSIS — G893 Neoplasm related pain (acute) (chronic): Secondary | ICD-10-CM

## 2018-07-08 DIAGNOSIS — C61 Malignant neoplasm of prostate: Secondary | ICD-10-CM

## 2018-07-08 DIAGNOSIS — C7951 Secondary malignant neoplasm of bone: Secondary | ICD-10-CM

## 2018-07-08 DIAGNOSIS — R410 Disorientation, unspecified: Secondary | ICD-10-CM

## 2018-07-08 DIAGNOSIS — Z803 Family history of malignant neoplasm of breast: Secondary | ICD-10-CM

## 2018-07-08 DIAGNOSIS — Z5111 Encounter for antineoplastic chemotherapy: Secondary | ICD-10-CM | POA: Diagnosis not present

## 2018-07-08 DIAGNOSIS — Z79899 Other long term (current) drug therapy: Secondary | ICD-10-CM

## 2018-07-08 DIAGNOSIS — Z7982 Long term (current) use of aspirin: Secondary | ICD-10-CM

## 2018-07-08 DIAGNOSIS — D649 Anemia, unspecified: Secondary | ICD-10-CM

## 2018-07-08 DIAGNOSIS — Z87891 Personal history of nicotine dependence: Secondary | ICD-10-CM

## 2018-07-08 DIAGNOSIS — Z8042 Family history of malignant neoplasm of prostate: Secondary | ICD-10-CM

## 2018-07-08 LAB — COMPREHENSIVE METABOLIC PANEL
ALBUMIN: 3.4 g/dL — AB (ref 3.5–5.0)
ALT: 17 U/L (ref 0–44)
AST: 30 U/L (ref 15–41)
Alkaline Phosphatase: 34 U/L — ABNORMAL LOW (ref 38–126)
Anion gap: 5 (ref 5–15)
BUN: 13 mg/dL (ref 8–23)
CO2: 20 mmol/L — AB (ref 22–32)
Calcium: 8.3 mg/dL — ABNORMAL LOW (ref 8.9–10.3)
Chloride: 106 mmol/L (ref 98–111)
Creatinine, Ser: 0.95 mg/dL (ref 0.61–1.24)
GFR calc Af Amer: >60 mL/min (ref >60)
GFR calc non Af Amer: >60 mL/min (ref >60)
GLUCOSE: 142 mg/dL — AB (ref 70–99)
Potassium: 4.3 mmol/L (ref 3.5–5.1)
Sodium: 131 mmol/L — ABNORMAL LOW (ref 135–145)
Total Bilirubin: 0.5 mg/dL (ref 0.3–1.2)
Total Protein: 6.9 g/dL (ref 6.5–8.1)

## 2018-07-08 LAB — CBC WITH DIFFERENTIAL/PLATELET
Abs Immature Granulocytes: 0.03 10*3/uL (ref 0.00–0.07)
Basophils Absolute: 0 10*3/uL (ref 0.0–0.1)
Basophils Relative: 1 %
Eosinophils Absolute: 0 10*3/uL (ref 0.0–0.5)
Eosinophils Relative: 0 %
HCT: 23 % — ABNORMAL LOW (ref 39.0–52.0)
Hemoglobin: 7.6 g/dL — ABNORMAL LOW (ref 13.0–17.0)
Immature Granulocytes: 1 %
LYMPHS PCT: 12 %
Lymphs Abs: 0.5 10*3/uL — ABNORMAL LOW (ref 0.7–4.0)
MCH: 27.6 pg (ref 26.0–34.0)
MCHC: 33 g/dL (ref 30.0–36.0)
MCV: 83.6 fL (ref 80.0–100.0)
MONOS PCT: 6 %
Monocytes Absolute: 0.3 10*3/uL (ref 0.1–1.0)
Neutro Abs: 3.5 10*3/uL (ref 1.7–7.7)
Neutrophils Relative %: 80 %
Platelets: 132 10*3/uL — ABNORMAL LOW (ref 150–400)
RBC: 2.75 MIL/uL — ABNORMAL LOW (ref 4.22–5.81)
RDW: 15.5 % (ref 11.5–15.5)
WBC: 4.3 10*3/uL (ref 4.0–10.5)
nRBC: 0 % (ref 0.0–0.2)

## 2018-07-08 LAB — ABO/RH: ABO/RH(D): O POS

## 2018-07-08 LAB — PREPARE RBC (CROSSMATCH)

## 2018-07-08 MED ORDER — DIPHENHYDRAMINE HCL 25 MG PO CAPS
25.0000 mg | ORAL_CAPSULE | Freq: Once | ORAL | Status: AC
  Administered 2018-07-08: 25 mg via ORAL
  Filled 2018-07-08: qty 1

## 2018-07-08 MED ORDER — ACETAMINOPHEN 325 MG PO TABS
650.0000 mg | ORAL_TABLET | Freq: Once | ORAL | Status: AC
  Administered 2018-07-08: 650 mg via ORAL
  Filled 2018-07-08: qty 2

## 2018-07-08 MED ORDER — HEPARIN SOD (PORK) LOCK FLUSH 100 UNIT/ML IV SOLN
500.0000 [IU] | Freq: Once | INTRAVENOUS | Status: AC
  Administered 2018-07-08: 500 [IU] via INTRAVENOUS
  Filled 2018-07-08: qty 5

## 2018-07-08 MED ORDER — SODIUM CHLORIDE 0.9% FLUSH
10.0000 mL | Freq: Once | INTRAVENOUS | Status: AC
  Administered 2018-07-08: 10 mL via INTRAVENOUS
  Filled 2018-07-08: qty 10

## 2018-07-08 MED ORDER — SODIUM CHLORIDE 0.9% IV SOLUTION
250.0000 mL | Freq: Once | INTRAVENOUS | Status: AC
  Administered 2018-07-08: 250 mL via INTRAVENOUS
  Filled 2018-07-08: qty 250

## 2018-07-08 NOTE — Progress Notes (Signed)
Patient here for follow up. Denies pain, but does state he feels weak.

## 2018-07-08 NOTE — Progress Notes (Signed)
Per Dr. Tasia Catchings,  no Taxotere treatment today. Patient will only receive 1 unit of blood.

## 2018-07-09 LAB — TYPE AND SCREEN
ABO/RH(D): O POS
Antibody Screen: NEGATIVE
Unit division: 0

## 2018-07-09 LAB — BPAM RBC
Blood Product Expiration Date: 202002142359
ISSUE DATE / TIME: 202002071132
Unit Type and Rh: 5100

## 2018-07-09 NOTE — Progress Notes (Signed)
Hematology/Oncology  Follow up note [facility] [phone] [phone]  Patient Care Team: [place] Pink, MD as PCP - General (Family Medicine) Earlie Server, MD as Medical Oncologist (Medical Oncology)  REASON FOR VISIT Follow up for antineoplasm treatment of Prostate Cancer  HISTORY OF PRESENTING ILLNESS:  Matthew Hunter 85 y.o.  male with PMH listed as below who was referred by [facility] Urology Provider [name] to me for evaluation of clinical prostate cancer and management.  He has a history of elevated PSA and BPH. PSA trends as follows: 11/14/2014 4.3, 11/07/2015 7.1, 05/11/2016 10.4, 06/11/2016 10.6, 07/07/2016 7.8, 01/06/2017 86.8.  He was seen by urology and was diagnosed with clinical prostate cancer given the extreme elevation of PSA and prostate nodule. Biopsy was not pursued. He was treated with [name] on 01/29/2017,  # prostate biopsy on 03/10/2017. 5 out of 6 cores positive for prostate cancer, with highest gleason score group 9 (4+5), + perineural invasion.  Stage IV prostate cancer, multiple osseus metastatic disease. No visceral involvement.   01/18/2018  CT scan image was independently reviewed by me and discussed with patient.  Patient has progression of bone metastatic disease only  Current treatment  Patient got first Center on 01/29/2018, switched to leupron on 04/13/2015,  Q6 months.  .Testosterone level at 14, castration level.  Upfront Gillermina Phy was added on December 2019 to assist fast disease control (phase II trial [place] B, Lancet Oncol. 2014) S/p palliative RT to lumber spine and SI joints. Xtandi discontinued on due to CNS toxicity in April 2019.  12/09/17 Abiaterone discontinued.  7/ 18/2019 Start Apalutimide '240mg'$  daily.   Trend of tumor marker  PSA 86.8 -->64.78 -->131--> 242 added Xtanti --> 17.74-->1.82 Xtandi stopped due to CNS toxicity in April 2019. --> 1.79- (added Abiaterone)-> 11.06-->24.53--> 39.48  [Abiaterone stopped, Added Apalutimid]--> 65.71-->88.63--> Xofigo treatment--> 215--> Back on Xtandi reduced dose.   12/09/17 Abiaterone discontinued due to rising PSA and disease progression. 12/16/2017 Start Apalutimide '240mg'$  daily. Held on 01/13/2018 due to rising PSA, also causing patient to feel dizziness/black out.  Started Xofigo treatment on 02/23/2018. 02/28/2018, back on Xtandi reduced dose '120mg'$ . Xtandi stopped Dec.2020 due to   05/20/2018 Weekly Docetaxol 2 weeks on one week off.   INTERVAL HISTORY Patient presents to follow up for management of metastatic prostate cancer and follow-up on his metastatic prostate cancer related symptoms, and evaluation prior to chemotherapy treatment.  #On weekly docetaxel, Status post 5 treatment of Xofigo. Report feeling weak today, " not doing well".  Pain has improved a lot. Only taking Oxycodone '10mg'$  once a day on average.   # patient has been confused on what medication he has been taking. So I asked him to bring all his medication today so I can go over with him.   # Appetite is still poor. He is not sure what appetite stimulant he has been taking or whether it is effective or not.  Gained 3 pounds since last visit.   Review of Systems  Constitutional: Positive for appetite change and fatigue. Negative for chills, fever and unexpected weight change.  HENT:   Negative for hearing loss and voice change.   Eyes: Negative for eye problems and icterus.  Respiratory: Negative for chest tightness, cough and shortness of breath.   Cardiovascular: Negative for chest pain and leg swelling.  Gastrointestinal: Negative for abdominal distention, abdominal pain and diarrhea.  Endocrine: Negative for hot flashes.  Genitourinary: Negative for difficulty urinating, dysuria and frequency.   Musculoskeletal:  Negative for arthralgias and back pain.  Skin: Negative for itching and rash.  Neurological: Negative for light-headedness and numbness.    Hematological: Negative for adenopathy. Does not bruise/bleed easily.  Psychiatric/Behavioral: Negative for confusion.    MEDICAL HISTORY:  Past Medical History:  Diagnosis Date  . Arthritis   . Benign enlargement of prostate   . CAD (coronary artery disease)   . Constipation   . Elevated PSA   . Family history of breast cancer   . Family history of prostate cancer   . GERD (gastroesophageal reflux disease)   . Hypertension   . Incomplete bladder emptying   . Nocturia   . Prostate cancer ([place]) 05/03/2017   Rad tx's and Lupron shots.   . Urgency of micturation   . Urinary frequency     SURGICAL HISTORY: Past Surgical History:  Procedure Laterality Date  . GALLBLADDER SURGERY    . PORTA CATH INSERTION N/A 06/07/2018   Procedure: PORTA CATH INSERTION;  Surgeon: Katha Cabal, MD;  Location: [facility] [facility];  Service: Cardiovascular;  Laterality: N/A;  . PROSTATE SURGERY    . STOMACH SURGERY      SOCIAL HISTORY: Social History   Socioeconomic History  . Marital status: Married    Spouse name: Not on file  . Number of children: Not on file  . Years of education: Not on file  . Highest education level: Not on file  Occupational History  . Not on file  Social Needs  . Financial resource strain: Not on file  . Food insecurity:    Worry: Not on file    Inability: Not on file  . Transportation needs:    Medical: Not on file    Non-medical: Not on file  Tobacco Use  . Smoking status: Former Smoker    Last attempt to quit: 06/01/1998    Years since quitting: 20.1  . Smokeless tobacco: Never Used  Substance and Sexual Activity  . Alcohol use: No    Alcohol/week: 0.0 standard drinks  . Drug use: Not Currently    Frequency: 2.0 times per week    Types: Marijuana    Comment: marijuana two time monthly gives him an appetite  . Sexual activity: Not on file  Lifestyle  . Physical activity:    Days per week: Not on file    Minutes per session: Not on file  .  Stress: Not on file  Relationships  . Social connections:    Talks on phone: Not on file    Gets together: Not on file    Attends religious service: Not on file    Active member of club or organization: Not on file    Attends meetings of clubs or organizations: Not on file    Relationship status: Not on file  . Intimate partner violence:    Fear of current or ex partner: Not on file    Emotionally abused: Not on file    Physically abused: Not on file    Forced sexual activity: Not on file  Other Topics Concern  . Not on file  Social History Narrative  . Not on file    FAMILY HISTORY: Family History  Problem Relation Age of Onset  . Hypertension Mother   . Stroke Father   . Breast cancer Niece        dx  under 66  . Breast cancer Daughter 50  . Prostate cancer Son 72  . Kidney disease Neg Hx   .  Kidney cancer Neg Hx   . Bladder Cancer Neg Hx     ALLERGIES:  has No Known Allergies.  MEDICATIONS:  Current Outpatient Medications  Medication Sig Dispense Refill  . aspirin EC 81 MG tablet Take by mouth.    Marland Kitchen atenolol (TENORMIN) 100 MG tablet Take 100 mg by mouth daily.     . Calcium Carb-Cholecalciferol (CALCIUM-VITAMIN D) 500-200 MG-UNIT tablet Take 1 tablet by mouth daily. 90 tablet 0  . dexamethasone (DECADRON) 4 MG tablet Take 1 tablet (4 mg total) by mouth daily. 60 tablet 0  . famotidine (PEPCID) 20 MG tablet Take 1 tablet (20 mg total) by mouth daily. 30 tablet 0  . fentaNYL (DURAGESIC - DOSED MCG/HR) 25 MCG/HR patch Place 1 patch (25 mcg total) onto the skin every 3 (three) days. 5 patch 0  . fentaNYL (DURAGESIC - DOSED MCG/HR) 50 MCG/HR Place 1 patch (50 mcg total) onto the skin every 3 (three) days. 10 patch 0  . finasteride (PROSCAR) 5 MG tablet Take 1 tablet (5 mg total) by mouth daily. 90 tablet 3  . hydrocortisone 2.5 % cream APP EXT AA BID  0  . lidocaine-prilocaine (EMLA) cream Apply 1 application topically as needed. 30 g 3  . megestrol (MEGACE) 40 MG  tablet Take 2 tablets (80 mg total) by mouth 2 (two) times daily. 120 tablet 0  . mirabegron ER (MYRBETRIQ) 25 MG TB24 tablet Take 1 tablet (25 mg total) by mouth daily. 90 tablet 3  . mirtazapine (REMERON) 15 MG tablet Take 15 mg by mouth at bedtime.  11  . mirtazapine (REMERON) 30 MG tablet Take 30 mg by mouth daily.    . nortriptyline (PAMELOR) 25 MG capsule Take 25 mg by mouth at bedtime. Reported on 05/16/2015  1  . nystatin (MYCOSTATIN) 100000 UNIT/ML suspension Take 5 mLs (500,000 Units total) by mouth 4 (four) times daily. 60 mL 0  . omeprazole (PRILOSEC) 20 MG capsule TAKE 1 CAPSULE(20 MG) BY MOUTH DAILY 30 capsule 4  . ondansetron (ZOFRAN) 8 MG tablet Take 1 tablet (8 mg total) by mouth 2 (two) times daily as needed for refractory nausea / vomiting. 30 tablet 1  . Oxycodone HCl 10 MG TABS Take 1 tablet (10 mg total) by mouth every 6 (six) hours as needed. 60 tablet 0  . polyethylene glycol (MIRALAX / GLYCOLAX) packet Take 17 g by mouth daily. Mix one tablespoon with 8oz of your favorite juice or water every day until you are having soft formed stools. Then start taking once daily if you didn't have a stool the day before. 30 each 0  . prochlorperazine (COMPAZINE) 10 MG tablet Take 1 tablet (10 mg total) by mouth every 6 (six) hours as needed (Nausea or vomiting). 30 tablet 1  . pyridOXINE (VITAMIN B-6) 50 MG tablet Take 50 mg by mouth daily.     Marland Kitchen senna-docusate (SENOKOT-S) 8.6-50 MG tablet Take 2 tablets by mouth daily. 60 tablet 3  . tamsulosin (FLOMAX) 0.4 MG CAPS capsule Take 1 capsule (0.4 mg total) by mouth daily. 90 capsule 3  . Telmisartan-Amlodipine 80-5 MG TABS Take 1 tablet by mouth daily.     Marland Kitchen triamcinolone cream (KENALOG) 0.1 % APP EXT AA BID     No current facility-administered medications for this visit.     PHYSICAL EXAMINATION: ECOG PERFORMANCE STATUS: 1 - Symptomatic but completely ambulatory Vitals:   07/08/18 0850  BP: (!) 98/58  Pulse: 94  Resp: 18  Temp:  (!)  97.2 F (36.2 C)   Filed Weights   07/08/18 0850  Weight: 168 lb 6.4 oz (76.4 kg)   Physical Exam Constitutional:      General: He is not in acute distress.    Appearance: He is not diaphoretic.  HENT:     Head: Normocephalic and atraumatic.     Mouth/Throat:     Pharynx: No oropharyngeal exudate.  Eyes:     General: No scleral icterus.    Conjunctiva/sclera: Conjunctivae normal.     Pupils: Pupils are equal, round, and reactive to light.  Neck:     Musculoskeletal: Normal range of motion and neck supple.     Thyroid: No thyromegaly.  Cardiovascular:     Rate and Rhythm: Normal rate and regular rhythm.     Heart sounds: Normal heart sounds. No murmur.  Pulmonary:     Effort: Pulmonary effort is normal. No respiratory distress.     Breath sounds: Normal breath sounds. No wheezing.  Abdominal:     General: Bowel sounds are normal. There is no distension.     Palpations: Abdomen is soft. There is no mass.     Tenderness: There is no abdominal tenderness.  Musculoskeletal: Normal range of motion.        General: No deformity.  Skin:    General: Skin is warm and dry.     Findings: No erythema or rash.  Neurological:     Mental Status: He is alert and oriented to person, place, and time.     Cranial Nerves: No cranial nerve deficit.     Coordination: Coordination normal.  Psychiatric:        Behavior: Behavior normal.        Thought Content: Thought content normal.    LABORATORY DATA:  I have reviewed the data as listed Lab Results  Component Value Date   WBC 4.3 07/08/2018   HGB 7.6 (L) 07/08/2018   HCT 23.0 (L) 07/08/2018   MCV 83.6 07/08/2018   PLT 132 (L) 07/08/2018   Recent Labs    07/01/18 0812 07/04/18 1649 07/07/18 1005 07/08/18 0841  NA 135 134* 134* 131*  K 4.0 4.7 3.6 4.3  CL 108 103 106 106  CO2 19* 24 21* 20*  GLUCOSE 107* 115* 119* 142*  BUN '15 19 16 13  '$ CREATININE 1.06 1.15 0.97 0.95  CALCIUM 8.8* 9.4 8.4* 8.3*  GFRNONAA >60 57* >60  >60  GFRAA >60 >60 >60 >60  PROT 7.1  --  6.9 6.9  ALBUMIN 3.5  --  3.5 3.4*  AST 27  --  33 30  ALT 14  --  19 17  ALKPHOS 40  --  36* 34*  BILITOT 1.0  --  0.6 0.5   RADIOGRAPHIC STUDIES: I have personally reviewed the radiological images as listed and agreed with the findings in the report. 12/15/2017 Bone scan whole body showed new sites of osseous tracer accumulation are identified especially to size at the distal left femoral diaphysis, L2 vertebral body, right iliac bone, right ischium. 01/18/2018 chest abdomen pelvis with contrast Extensive metastasis disease to bone, with all bone lesions new from the abdomen and pelvis CT dated 9/5 2018 and a chest CT dated 06/19/2013.  Involvement is similar to the recent bone scan.  No other evidence of metastatic disease.  No acute abnormalities in the chest abdomen and pelvis.  03/14/2018 CT chest Angio PE protocol 1. No acute cardiopulmonary abnormalities. No evidence for acute pulmonary embolus 2. Coronary  artery atherosclerotic calcifications 3.  Aortic Atherosclerosis (ICD10-I70.0). 4. Sclerotic bone metastasis as noted previously  03/25/2018 CT abdomen pelvis with contrast 1. No acute abnormality in the abdomen or pelvis. 2. Extensive metastatic bone disease. Findings are similar to theexam on 01/18/2018.  3. Nonobstructive left kidney stone. 4. Stable left adrenal nodule.  ASSESSMENT & PLAN: 85 y.o. male follows up for management of metastatic prostate cancer and neoplasm related symptoms..  1. Prostate cancer metastatic to bone ([place])   2. Family history of breast cancer   3. Prostate cancer ([place])   4. Family history of prostate cancer   5. Bone metastasis ([place])   6. 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 ADT has been given by urology office.  PSA has decreased to 196.  Status post 5 out of 6 planned [facility] treatment.   Due to his profound fatigue, will hold chemotherapy today.     # Symptomatic anemia, hemoglobin 7.6, will type and cross and transfuse 1 unit of irradiated PRBC.   # poor appetite, after going over his medication, although patient claims that he has been taking Megace, he has a near full bottle of Megace which was prescribed last year, obviously not taking.  Discussed about disconitnue prednisone and start taking Megace  Weight has been stable.   # Neoplasm related pain, continue current regimen. Fentanyl patch 92mg Q72hours and oxycodone '10mg'$  Q6 hours as needed. Pain is better controlled.   #Somatic BRCA1 mutation positive.  Patient has been referred to genetic counseling PARP  inhibitors can be considered as the next line of treatment.  Combination with XTrudi Idahas not been well studied. Patient rescheduled his genetic counselor appointment.  Return of visit: Labs CBC CMP in 1 week, MD assessment prior to next docetaxel treatment. We spent sufficient time to discuss many aspect of care, questions were answered to patient's satisfaction. Total face to face encounter time for this patient visit was 25 min. >50% of the time was  spent in counseling and coordination of care.   ZEarlie Server MD, PhD Hematology Oncology CCypress Creek Out[facility] Surgical [facility] LLCat ACentracarePager- [phone]/12/2018

## 2018-07-13 ENCOUNTER — Ambulatory Visit
Admission: RE | Admit: 2018-07-13 | Discharge: 2018-07-13 | Disposition: A | Discharge status: Home / Self Care | Payer: Medicare Other | Source: Ambulatory Visit | Attending: Radiation Oncology | Admitting: Radiation Oncology

## 2018-07-13 ENCOUNTER — Ambulatory Visit: Payer: Medicare Other | Admitting: Radiation Oncology

## 2018-07-13 DIAGNOSIS — C7951 Secondary malignant neoplasm of bone: Secondary | ICD-10-CM | POA: Insufficient documentation

## 2018-07-13 DIAGNOSIS — C61 Malignant neoplasm of prostate: Secondary | ICD-10-CM | POA: Insufficient documentation

## 2018-07-13 MED ORDER — RADIUM RA 223 DICHLORIDE 30 MCCI/ML IV SOLN
118.5000 | Freq: Once | INTRAVENOUS | Status: AC
  Administered 2018-07-13: 118.5 via INTRAVENOUS

## 2018-07-13 NOTE — Progress Notes (Signed)
Radiation Oncology XofigoInfusion note  Name: Matthew Hunter   Date:   07/13/2018 MRN:  [id number] DOB: November 28, 1929    This 85 y.o. male presents to the clinic today for 6 and final Xofigoinfusion.  REFERRING PROVIDER: Noreene Filbert, MD  HPI: patient is a 85 year old male seen today for his sixth and final Xofigo infusionpatient is doing well he states his pain is markedly markedly improved. He is without complaint today..  COMPLICATIONS OF TREATMENT: none  FOLLOW UP COMPLIANCE: keeps appointments   PHYSICAL EXAM:  There were no vitals taken for this visit. Well-developed well-nourished patient in NAD. HEENT reveals PERLA, EOMI, discs not visualized.  Oral cavity is clear. No oral mucosal lesions are identified. Neck is clear without evidence of cervical or supraclavicular adenopathy. Lungs are clear to A&P. Cardiac examination is essentially unremarkable with regular rate and rhythm without murmur rub or thrill. Abdomen is benign with no organomegaly or masses noted. Motor sensory and DTR levels are equal and symmetric in the upper and lower extremities. Cranial nerves II through XII are grossly intact. Proprioception is intact. No peripheral adenopathy or edema is identified. No motor or sensory levels are noted. Crude visual fields are within normal range.  RADIOLOGY RESULTS: no current films for review  PLAN: Peripheral line was started on the patient and 20 cc of normal saline were passed to make sure there was patency of the lines. Trudi Ida was administered over a 5 minute infusion push by nuclear medicine technologist supervised by radiation oncologist. After completion of IV push of Xofigo 30 cc an additional saline were passed through the peripheral line. Oral lines syringes drapes and original container of Xofigo were then taken to nuclear medicine for storage. Patient tolerated the procedure well without side effect or complaint. Patient has anti-emetic medication. Have scheduled the  patient for a three-week followup to check on his counts. Patient is to call sooner with any side effects or complaints.    Noreene Filbert, MD

## 2018-07-15 ENCOUNTER — Other Ambulatory Visit: Payer: Self-pay

## 2018-07-15 ENCOUNTER — Inpatient Hospital Stay: Payer: Medicare Other

## 2018-07-15 ENCOUNTER — Inpatient Hospital Stay (HOSPITAL_BASED_OUTPATIENT_CLINIC_OR_DEPARTMENT_OTHER): Payer: Medicare Other | Admitting: Oncology

## 2018-07-15 VITALS — BP 101/56 | HR 63 | Temp 97.2°F | Ht 69.0 in | Wt 165.2 lb

## 2018-07-15 DIAGNOSIS — C61 Malignant neoplasm of prostate: Secondary | ICD-10-CM

## 2018-07-15 DIAGNOSIS — C7951 Secondary malignant neoplasm of bone: Secondary | ICD-10-CM

## 2018-07-15 DIAGNOSIS — G893 Neoplasm related pain (acute) (chronic): Secondary | ICD-10-CM | POA: Diagnosis not present

## 2018-07-15 DIAGNOSIS — Z5111 Encounter for antineoplastic chemotherapy: Secondary | ICD-10-CM

## 2018-07-15 DIAGNOSIS — Z79899 Other long term (current) drug therapy: Secondary | ICD-10-CM

## 2018-07-15 DIAGNOSIS — Z803 Family history of malignant neoplasm of breast: Secondary | ICD-10-CM

## 2018-07-15 DIAGNOSIS — Z87891 Personal history of nicotine dependence: Secondary | ICD-10-CM

## 2018-07-15 DIAGNOSIS — Z7982 Long term (current) use of aspirin: Secondary | ICD-10-CM

## 2018-07-15 DIAGNOSIS — Z8042 Family history of malignant neoplasm of prostate: Secondary | ICD-10-CM

## 2018-07-15 DIAGNOSIS — R634 Abnormal weight loss: Secondary | ICD-10-CM

## 2018-07-15 LAB — CBC WITH DIFFERENTIAL/PLATELET
Abs Immature Granulocytes: 0.04 10*3/uL (ref 0.00–0.07)
BASOS ABS: 0 10*3/uL (ref 0.0–0.1)
Basophils Relative: 0 %
EOS ABS: 0 10*3/uL (ref 0.0–0.5)
Eosinophils Relative: 0 %
HCT: 27.8 % — ABNORMAL LOW (ref 39.0–52.0)
Hemoglobin: 9.3 g/dL — ABNORMAL LOW (ref 13.0–17.0)
Immature Granulocytes: 1 %
Lymphocytes Relative: 15 %
Lymphs Abs: 0.9 10*3/uL (ref 0.7–4.0)
MCH: 27.7 pg (ref 26.0–34.0)
MCHC: 33.5 g/dL (ref 30.0–36.0)
MCV: 82.7 fL (ref 80.0–100.0)
Monocytes Absolute: 1.2 10*3/uL — ABNORMAL HIGH (ref 0.1–1.0)
Monocytes Relative: 20 %
Neutro Abs: 3.9 10*3/uL (ref 1.7–7.7)
Neutrophils Relative %: 64 %
Platelets: 182 10*3/uL (ref 150–400)
RBC: 3.36 MIL/uL — ABNORMAL LOW (ref 4.22–5.81)
RDW: 15.2 % (ref 11.5–15.5)
WBC: 6 10*3/uL (ref 4.0–10.5)
nRBC: 0 % (ref 0.0–0.2)

## 2018-07-15 LAB — COMPREHENSIVE METABOLIC PANEL
ALT: 21 U/L (ref 0–44)
AST: 29 U/L (ref 15–41)
Albumin: 3.5 g/dL (ref 3.5–5.0)
Alkaline Phosphatase: 36 U/L — ABNORMAL LOW (ref 38–126)
Anion gap: 9 (ref 5–15)
BUN: 23 mg/dL (ref 8–23)
CO2: 19 mmol/L — AB (ref 22–32)
Calcium: 9.3 mg/dL (ref 8.9–10.3)
Chloride: 104 mmol/L (ref 98–111)
Creatinine, Ser: 1.1 mg/dL (ref 0.61–1.24)
GFR calc non Af Amer: >60 mL/min (ref >60)
Glucose, Bld: 88 mg/dL (ref 70–99)
Potassium: 4.2 mmol/L (ref 3.5–5.1)
SODIUM: 132 mmol/L — AB (ref 135–145)
Total Bilirubin: 0.6 mg/dL (ref 0.3–1.2)
Total Protein: 7.1 g/dL (ref 6.5–8.1)

## 2018-07-15 LAB — PSA: Prostatic Specific Antigen: 163 ng/mL — ABNORMAL HIGH (ref 0.00–4.00)

## 2018-07-15 MED ORDER — HEPARIN SOD (PORK) LOCK FLUSH 100 UNIT/ML IV SOLN: 500.0000 [IU] | Freq: Once | INTRAVENOUS | Status: DC | PRN

## 2018-07-15 MED ORDER — SODIUM CHLORIDE 0.9 % IV SOLN
36.0000 mg/m2 | Freq: Once | INTRAVENOUS | Status: AC
  Administered 2018-07-15: 70 mg via INTRAVENOUS
  Filled 2018-07-15: qty 7

## 2018-07-15 MED ORDER — SODIUM CHLORIDE 0.9% FLUSH
10.0000 mL | INTRAVENOUS | Status: AC | PRN
  Administered 2018-07-15: 10 mL via INTRAVENOUS
  Filled 2018-07-15: qty 10

## 2018-07-15 MED ORDER — DEXAMETHASONE SODIUM PHOSPHATE 10 MG/ML IJ SOLN
10.0000 mg | Freq: Once | INTRAMUSCULAR | Status: AC
  Administered 2018-07-15: 10 mg via INTRAVENOUS
  Filled 2018-07-15: qty 1

## 2018-07-15 MED ORDER — SODIUM CHLORIDE 0.9% FLUSH
10.0000 mL | INTRAVENOUS | Status: DC | PRN
  Filled 2018-07-15: qty 10

## 2018-07-15 MED ORDER — HEPARIN SOD (PORK) LOCK FLUSH 100 UNIT/ML IV SOLN
500.0000 [IU] | Freq: Once | INTRAVENOUS | Status: AC
  Administered 2018-07-15: 500 [IU] via INTRAVENOUS
  Filled 2018-07-15: qty 5

## 2018-07-15 MED ORDER — SODIUM CHLORIDE 0.9 % IV SOLN
Freq: Once | INTRAVENOUS | Status: AC
  Administered 2018-07-15: 10:00:00 via INTRAVENOUS
  Filled 2018-07-15: qty 250

## 2018-07-15 MED ORDER — NYSTATIN 100000 UNIT/ML MT SUSP: 5.0000 mL | Freq: Three times a day (TID) | OROMUCOSAL | 0 refills | Status: DC

## 2018-07-17 MED ORDER — FENTANYL 50 MCG/HR TD PT72: 10.0000 | MEDICATED_PATCH | TRANSDERMAL | 0 refills | Status: DC

## 2018-07-17 NOTE — Progress Notes (Signed)
Hematology/Oncology  Follow up note [facility] [phone] [phone]  Patient Care Team: [place] Pink, MD as PCP - General (Family Medicine) Earlie Server, MD as Medical Oncologist (Medical Oncology)  REASON FOR VISIT Follow up for antineoplasm treatment of Prostate Cancer  HISTORY OF PRESENTING ILLNESS:  Matthew Hunter 85 y.o.  male with PMH listed as below who was referred by [facility] Provider [name] to me for evaluation of clinical prostate cancer and management.  He has a history of elevated PSA and BPH. PSA trends as follows: 11/14/2014 4.3, 11/07/2015 7.1, 05/11/2016 10.4, 06/11/2016 10.6, 07/07/2016 7.8, 01/06/2017 86.8.  He was seen by urology and was diagnosed with clinical prostate cancer given the extreme elevation of PSA and prostate nodule. Biopsy was not pursued. He was treated with [name] on 01/29/2017,  # prostate biopsy on 03/10/2017. 5 out of 6 cores positive for prostate cancer, with highest gleason score group 9 (4+5), + perineural invasion.  Stage IV prostate cancer, multiple osseus metastatic disease. No visceral involvement.   01/18/2018  CT scan image was independently reviewed by me and discussed with patient.  Patient has progression of bone metastatic disease only  Current treatment  Patient got first [place] on 01/29/2018, switched to leupron on 04/13/2015,  Q6 months.  .Testosterone level at 14, castration level.  Upfront Gillermina Phy was added on December 2019 to assist fast disease control (phase II trial [place] B, [facility] Oncol. 2014) S/p palliative RT to lumber spine and SI joints. Xtandi discontinued on due to CNS toxicity in April 2019.  12/09/17 Abiaterone discontinued.  7/ 18/2019 Start Apalutimide 221m daily.   Trend of tumor marker  PSA 86.8 -->64.78 -->131--> 242 added Xtanti --> 17.74-->1.82 Xtandi stopped due to CNS toxicity in April 2019. --> 1.79- (added Abiaterone)-> 11.06-->24.53--> 39.48  [Abiaterone stopped, Added Apalutimid]--> 65.71-->88.63--> Xofigo treatment--> 215--> Back on Xtandi reduced dose.   12/09/17 Abiaterone discontinued due to rising PSA and disease progression. 12/16/2017 Start Apalutimide 2487mdaily. Held on 01/13/2018 due to rising PSA, also causing patient to feel dizziness/black out.  Started Xofigo treatment on 02/23/2018. 02/28/2018, back on Xtandi reduced dose 12043mXtandi stopped Dec.2020 due to   05/20/2018 Weekly Docetaxel 2 weeks on one week off.   INTERVAL HISTORY Patient presents to follow up for management of metastatic prostate cancer and follow-up on his metastatic prostate cancer related symptoms, and evaluation prior to chemotherapy treatment.  Finish 6 cycles of Xofigo treatments on 07/13/2018. S/p 1 unit blood transfusion last week. Fatigue has significantly improved.  On Docetaxel 26m59m two weeks on and one week off.  Reports doing better today. Denies nausea or vomiting.   # Pain is better controlled. He is on Fentanyl patch 50mc33m2 hours, pain is well controlled. Occassionally takes Oxycodone 10mg 73my 6 hours as needed.   # Appetite is better, taking Megace 40mg B68m   Review of Systems  Constitutional: Positive for fatigue. Negative for appetite change, chills, fever and unexpected weight change.  HENT:   Negative for hearing loss and voice change.   Eyes: Negative for eye problems and icterus.  Respiratory: Negative for chest tightness, cough and shortness of breath.   Cardiovascular: Negative for chest pain and leg swelling.  Gastrointestinal: Negative for abdominal distention, abdominal pain and diarrhea.  Endocrine: Negative for hot flashes.  Genitourinary: Negative for difficulty urinating, dysuria and frequency.   Musculoskeletal: Negative for arthralgias and back pain.  Skin: Negative for itching and rash.  Neurological: Negative for light-headedness  and numbness.  Hematological: Negative for adenopathy. Does not  bruise/bleed easily.  Psychiatric/Behavioral: Negative for confusion.    MEDICAL HISTORY:  Past Medical History:  Diagnosis Date  . Arthritis   . Benign enlargement of prostate   . CAD (coronary artery disease)   . Constipation   . Elevated PSA   . Family history of breast cancer   . Family history of prostate cancer   . GERD (gastroesophageal reflux disease)   . Hypertension   . Incomplete bladder emptying   . Nocturia   . Prostate cancer ([place]) 05/03/2017   Rad tx's and Lupron shots.   . Urgency of micturation   . Urinary frequency     SURGICAL HISTORY: Past Surgical History:  Procedure Laterality Date  . GALLBLADDER SURGERY    . PORTA CATH INSERTION N/A 06/07/2018   Procedure: PORTA CATH INSERTION;  Surgeon: Katha Cabal, MD;  Location: [place] [facility];  Service: Cardiovascular;  Laterality: N/A;  . PROSTATE SURGERY    . STOMACH SURGERY      SOCIAL HISTORY: Social History   Socioeconomic History  . Marital status: Married    Spouse name: Not on file  . Number of children: Not on file  . Years of education: Not on file  . Highest education level: Not on file  Occupational History  . Not on file  Social Needs  . Financial resource strain: Not on file  . Food insecurity:    Worry: Not on file    Inability: Not on file  . Transportation needs:    Medical: Not on file    Non-medical: Not on file  Tobacco Use  . Smoking status: Former Smoker    Last attempt to quit: 06/01/1998    Years since quitting: 20.1  . Smokeless tobacco: Never Used  Substance and Sexual Activity  . Alcohol use: No    Alcohol/week: 0.0 standard drinks  . Drug use: Not Currently    Frequency: 2.0 times per week    Types: Marijuana    Comment: marijuana two time monthly gives him an appetite  . Sexual activity: Not on file  Lifestyle  . Physical activity:    Days per week: Not on file    Minutes per session: Not on file  . Stress: Not on file  Relationships  . Social  connections:    Talks on phone: Not on file    Gets together: Not on file    Attends religious service: Not on file    Active member of club or organization: Not on file    Attends meetings of clubs or organizations: Not on file    Relationship status: Not on file  . Intimate partner violence:    Fear of current or ex partner: Not on file    Emotionally abused: Not on file    Physically abused: Not on file    Forced sexual activity: Not on file  Other Topics Concern  . Not on file  Social History Narrative  . Not on file    FAMILY HISTORY: Family History  Problem Relation Age of Onset  . Hypertension Mother   . Stroke Father   . Breast cancer Niece        dx  under 93  . Breast cancer Daughter 47  . Prostate cancer Son 76  . Kidney disease Neg Hx   . Kidney cancer Neg Hx   . Bladder Cancer Neg Hx     ALLERGIES:  has  No Known Allergies.  MEDICATIONS:  Current Outpatient Medications  Medication Sig Dispense Refill  . aspirin EC 81 MG tablet Take by mouth.    Marland Kitchen atenolol (TENORMIN) 100 MG tablet Take 100 mg by mouth daily.     . Calcium Carb-Cholecalciferol (CALCIUM-VITAMIN D) 500-200 MG-UNIT tablet Take 1 tablet by mouth daily. 90 tablet 0  . dexamethasone (DECADRON) 4 MG tablet Take 1 tablet (4 mg total) by mouth daily. 60 tablet 0  . famotidine (PEPCID) 20 MG tablet Take 1 tablet (20 mg total) by mouth daily. 30 tablet 0  . fentaNYL (DURAGESIC - DOSED MCG/HR) 25 MCG/HR patch Place 1 patch (25 mcg total) onto the skin every 3 (three) days. 5 patch 0  . fentaNYL (DURAGESIC - DOSED MCG/HR) 50 MCG/HR Place 1 patch (50 mcg total) onto the skin every 3 (three) days. 10 patch 0  . finasteride (PROSCAR) 5 MG tablet Take 1 tablet (5 mg total) by mouth daily. 90 tablet 3  . hydrocortisone 2.5 % cream APP EXT AA BID  0  . lidocaine-prilocaine (EMLA) cream Apply 1 application topically as needed. 30 g 3  . megestrol (MEGACE) 40 MG tablet Take 2 tablets (80 mg total) by mouth 2 (two)  times daily. 120 tablet 0  . mirabegron ER (MYRBETRIQ) 25 MG TB24 tablet Take 1 tablet (25 mg total) by mouth daily. 90 tablet 3  . mirtazapine (REMERON) 15 MG tablet Take 15 mg by mouth at bedtime.  11  . mirtazapine (REMERON) 30 MG tablet Take 30 mg by mouth daily.    . nortriptyline (PAMELOR) 25 MG capsule Take 25 mg by mouth at bedtime. Reported on 05/16/2015  1  . nystatin (MYCOSTATIN) 100000 UNIT/ML suspension Take 5 mLs (500,000 Units total) by mouth 3 (three) times daily. 473 mL 0  . omeprazole (PRILOSEC) 20 MG capsule TAKE 1 CAPSULE(20 MG) BY MOUTH DAILY 30 capsule 4  . ondansetron (ZOFRAN) 8 MG tablet Take 1 tablet (8 mg total) by mouth 2 (two) times daily as needed for refractory nausea / vomiting. 30 tablet 1  . Oxycodone HCl 10 MG TABS Take 1 tablet (10 mg total) by mouth every 6 (six) hours as needed. 60 tablet 0  . polyethylene glycol (MIRALAX / GLYCOLAX) packet Take 17 g by mouth daily. Mix one tablespoon with 8oz of your favorite juice or water every day until you are having soft formed stools. Then start taking once daily if you didn't have a stool the day before. 30 each 0  . prochlorperazine (COMPAZINE) 10 MG tablet Take 1 tablet (10 mg total) by mouth every 6 (six) hours as needed (Nausea or vomiting). 30 tablet 1  . pyridOXINE (VITAMIN B-6) 50 MG tablet Take 50 mg by mouth daily.     Marland Kitchen senna-docusate (SENOKOT-S) 8.6-50 MG tablet Take 2 tablets by mouth daily. 60 tablet 3  . tamsulosin (FLOMAX) 0.4 MG CAPS capsule Take 1 capsule (0.4 mg total) by mouth daily. 90 capsule 3  . Telmisartan-Amlodipine 80-5 MG TABS Take 1 tablet by mouth daily.     Marland Kitchen triamcinolone cream (KENALOG) 0.1 % APP EXT AA BID     No current facility-administered medications for this visit.    Facility-Administered Medications Ordered in Other Visits  Medication Dose Route Frequency Provider Last Rate Last Dose  . sodium chloride flush (NS) 0.9 % injection 10 mL  10 mL Intravenous PRN Earlie Server, MD   10 mL  at 07/15/18 0847    PHYSICAL EXAMINATION: ECOG  PERFORMANCE STATUS: 1 - Symptomatic but completely ambulatory Vitals:   07/15/18 0906  BP: (!) 101/56  Pulse: 63  Temp: (!) 97.2 F (36.2 C)   Filed Weights   07/15/18 0906  Weight: 165 lb 3 oz (74.9 kg)   Physical Exam Constitutional:      General: He is not in acute distress.    Appearance: He is not diaphoretic.  HENT:     Head: Normocephalic and atraumatic.     Mouth/Throat:     Pharynx: No oropharyngeal exudate.  Eyes:     General: No scleral icterus.    Conjunctiva/sclera: Conjunctivae normal.     Pupils: Pupils are equal, round, and reactive to light.  Neck:     Musculoskeletal: Normal range of motion and neck supple.     Thyroid: No thyromegaly.  Cardiovascular:     Rate and Rhythm: Normal rate and regular rhythm.     Heart sounds: Normal heart sounds. No murmur.  Pulmonary:     Effort: Pulmonary effort is normal. No respiratory distress.     Breath sounds: Normal breath sounds. No wheezing.  Abdominal:     General: Bowel sounds are normal. There is no distension.     Palpations: Abdomen is soft. There is no mass.     Tenderness: There is no abdominal tenderness.  Musculoskeletal: Normal range of motion.        General: No deformity.  Skin:    General: Skin is warm and dry.     Findings: No erythema or rash.  Neurological:     Mental Status: He is alert and oriented to person, place, and time.     Cranial Nerves: No cranial nerve deficit.     Coordination: Coordination normal.  Psychiatric:        Behavior: Behavior normal.        Thought Content: Thought content normal.    LABORATORY DATA:  I have reviewed the data as listed Lab Results  Component Value Date   WBC 6.0 07/15/2018   HGB 9.3 (L) 07/15/2018   HCT 27.8 (L) 07/15/2018   MCV 82.7 07/15/2018   PLT 182 07/15/2018   Recent Labs    07/07/18 1005 07/08/18 0841 07/15/18 0847  NA 134* 131* 132*  K 3.6 4.3 4.2  CL 106 106 104  CO2 21*  20* 19*  GLUCOSE 119* 142* 88  BUN _0 CREATININE 0.97 0.95 1.10  CALCIUM 8.4* 8.3* 9.3  GFRNONAA >60 >60 >60  GFRAA >60 >60 >60  PROT 6.9 6.9 7.1  ALBUMIN 3.5 3.4* 3.5  AST 33 30 29  ALT _1 ALKPHOS 36* 34* 36*  BILITOT 0.6 0.5 0.6   RADIOGRAPHIC STUDIES: I have personally reviewed the radiological images as listed and agreed with the findings in the report. 12/15/2017 Bone scan whole body showed new sites of osseous tracer accumulation are identified especially to size at the distal left femoral diaphysis, L2 vertebral body, right iliac bone, right ischium. 01/18/2018 chest abdomen pelvis with contrast Extensive metastasis disease to bone, with all bone lesions new from the abdomen and pelvis CT dated 9/5 2018 and a chest CT dated 06/19/2013.  Involvement is similar to the recent bone scan.  No other evidence of metastatic disease.  No acute abnormalities in the chest abdomen and pelvis.  03/14/2018 CT chest Angio PE protocol 1. No acute cardiopulmonary abnormalities. No evidence for acute pulmonary embolus 2. Coronary artery atherosclerotic calcifications 3.  Aortic Atherosclerosis (ICD10-I70.0).  4. Sclerotic bone metastasis as noted previously  03/25/2018 CT abdomen pelvis with contrast 1. No acute abnormality in the abdomen or pelvis. 2. Extensive metastatic bone disease. Findings are similar to theexam on 01/18/2018.  3. Nonobstructive left kidney stone. 4. Stable left adrenal nodule.  ASSESSMENT & PLAN: 85 y.o. male follows up for management of metastatic prostate cancer and neoplasm related symptoms..  1. Prostate cancer ([place])   2. Bone metastasis ([place])   3. Neoplasm related pain   4. Encounter for antineoplastic chemotherapy   5. Weight loss   6. Family history of breast cancer   7. 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 ADT has been given by urology office.  PSA has decreased to 163.  Finished  6 cycles of Xofigo treatment.   Proceed with Docetaxel today.   # Anemia, s/p 1 unit PRBC last week. hemgolobin improved to 9.3 # Weight loss, continue megace 73m BID.  # Neoplasm related pain, decrease t Fentanyl patch to 512m Q72h and Oxycodone 1022m6h.   #Somatic BRCA1 mutation positive.  Patient has been referred to genetic counseling PARP  inhibitors can be considered as the next line of treatment.  Combination with XofTrudi Idas not been well studied. Patient rescheduled his genetic counselor appointment.  Return of visit: Labs CBC CMP in 1 week and Docetaxel treatment.  Lab, MD assessment prior to next round of Docetaxel treatment. ZhoEarlie ServerD, PhD Hematology Oncology [facility] [id number]/2020

## 2018-07-18 ENCOUNTER — Other Ambulatory Visit: Payer: Self-pay | Admitting: Genetics

## 2018-07-18 DIAGNOSIS — C61 Malignant neoplasm of prostate: Secondary | ICD-10-CM

## 2018-07-18 DIAGNOSIS — C7951 Secondary malignant neoplasm of bone: Secondary | ICD-10-CM

## 2018-07-18 DIAGNOSIS — Z8042 Family history of malignant neoplasm of prostate: Secondary | ICD-10-CM

## 2018-07-18 DIAGNOSIS — Z803 Family history of malignant neoplasm of breast: Secondary | ICD-10-CM

## 2018-07-20 ENCOUNTER — Telehealth: Payer: Self-pay | Admitting: *Deleted

## 2018-07-20 NOTE — Telephone Encounter (Signed)
Pharmacy requests order clarification for fentanyl patch.

## 2018-07-20 NOTE — Telephone Encounter (Signed)
Decrease to 24mcg Q72 hours at last visit.  Previously on 27mcg. Pain better controlled after RT and chemotherapy.

## 2018-07-21 ENCOUNTER — Telehealth: Payer: Self-pay | Admitting: Genetics

## 2018-07-21 NOTE — Telephone Encounter (Signed)
Spoke to Risco at [employer] and clarified dosage change and frequency.

## 2018-07-21 NOTE — Telephone Encounter (Signed)
Spoke with Patient's wife regarding genteic testing.   Revealed negative genetic testing.  (no germline BRCA mutations).   Revealed that a VUS's in [facility] were noted.    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    The lab will continue to work on this, but has run out of DNA to perform additional analysis, I informed her we would like to send an additional blood sample to help the lab clarify this.    They had concerns about how much blood he could have taken out safely- I informed her I would relay her concerns to the lab and they can assess if it is too much, we can get more at another time or consider a saliva sample.   Overall, based on theses results so far, this indicates that it is unlikely Matthew Hunter's cancer is due to a hereditary cause.  It is unlikely that there is an increased risk of another cancer due to a mutation in one of these genes.  However, genetic testing is not perfect, and cannot definitively rule out a hereditary cause.  It will be important for him to keep in contact with genetics to learn if any additional testing may be needed in the future.

## 2018-07-22 ENCOUNTER — Inpatient Hospital Stay: Payer: Medicare Other

## 2018-07-22 ENCOUNTER — Other Ambulatory Visit: Payer: Self-pay | Admitting: *Deleted

## 2018-07-22 ENCOUNTER — Ambulatory Visit
Admission: RE | Admit: 2018-07-22 | Discharge: 2018-07-22 | Disposition: A | Discharge status: Home / Self Care | Payer: Medicare Other | Source: Ambulatory Visit | Attending: Oncology | Admitting: Oncology

## 2018-07-22 ENCOUNTER — Inpatient Hospital Stay (HOSPITAL_BASED_OUTPATIENT_CLINIC_OR_DEPARTMENT_OTHER): Payer: Medicare Other | Admitting: Oncology

## 2018-07-22 ENCOUNTER — Encounter: Payer: Self-pay | Admitting: Oncology

## 2018-07-22 ENCOUNTER — Other Ambulatory Visit: Payer: Medicare Other

## 2018-07-22 ENCOUNTER — Other Ambulatory Visit: Payer: Self-pay

## 2018-07-22 VITALS — BP 117/70 | HR 80 | Temp 98.9°F | Ht 69.0 in | Wt 160.0 lb

## 2018-07-22 VITALS — BP 117/70 | HR 80 | Temp 97.3°F | Resp 18 | Wt 160.2 lb

## 2018-07-22 DIAGNOSIS — R410 Disorientation, unspecified: Secondary | ICD-10-CM

## 2018-07-22 DIAGNOSIS — R0981 Nasal congestion: Secondary | ICD-10-CM | POA: Insufficient documentation

## 2018-07-22 DIAGNOSIS — C7951 Secondary malignant neoplasm of bone: Secondary | ICD-10-CM

## 2018-07-22 DIAGNOSIS — R634 Abnormal weight loss: Secondary | ICD-10-CM

## 2018-07-22 DIAGNOSIS — R0602 Shortness of breath: Secondary | ICD-10-CM

## 2018-07-22 DIAGNOSIS — D649 Anemia, unspecified: Secondary | ICD-10-CM

## 2018-07-22 DIAGNOSIS — G893 Neoplasm related pain (acute) (chronic): Secondary | ICD-10-CM

## 2018-07-22 DIAGNOSIS — Z7982 Long term (current) use of aspirin: Secondary | ICD-10-CM

## 2018-07-22 DIAGNOSIS — Z5111 Encounter for antineoplastic chemotherapy: Secondary | ICD-10-CM | POA: Diagnosis not present

## 2018-07-22 DIAGNOSIS — C61 Malignant neoplasm of prostate: Secondary | ICD-10-CM

## 2018-07-22 DIAGNOSIS — Z79899 Other long term (current) drug therapy: Secondary | ICD-10-CM

## 2018-07-22 DIAGNOSIS — Z8042 Family history of malignant neoplasm of prostate: Secondary | ICD-10-CM

## 2018-07-22 DIAGNOSIS — Z87891 Personal history of nicotine dependence: Secondary | ICD-10-CM

## 2018-07-22 DIAGNOSIS — Z803 Family history of malignant neoplasm of breast: Secondary | ICD-10-CM

## 2018-07-22 LAB — URINALYSIS, COMPLETE (UACMP) WITH MICROSCOPIC
BILIRUBIN URINE: NEGATIVE
Bacteria, UA: NONE SEEN
Glucose, UA: NEGATIVE mg/dL
Hgb urine dipstick: NEGATIVE
Ketones, ur: NEGATIVE mg/dL
Leukocytes,Ua: NEGATIVE
Nitrite: NEGATIVE
Protein, ur: NEGATIVE mg/dL
Specific Gravity, Urine: 1.013 (ref 1.005–1.030)
pH: 5 (ref 5.0–8.0)

## 2018-07-22 LAB — COMPREHENSIVE METABOLIC PANEL
ALK PHOS: 38 U/L (ref 38–126)
ALT: 20 U/L (ref 0–44)
AST: 31 U/L (ref 15–41)
Albumin: 3.3 g/dL — ABNORMAL LOW (ref 3.5–5.0)
Anion gap: 10 (ref 5–15)
BUN: 17 mg/dL (ref 8–23)
CALCIUM: 8.8 mg/dL — AB (ref 8.9–10.3)
CO2: 15 mmol/L — ABNORMAL LOW (ref 22–32)
Chloride: 104 mmol/L (ref 98–111)
Creatinine, Ser: 1.11 mg/dL (ref 0.61–1.24)
GFR calc Af Amer: >60 mL/min (ref >60)
GFR calc non Af Amer: 60 mL/min — ABNORMAL LOW (ref >60)
Glucose, Bld: 107 mg/dL — ABNORMAL HIGH (ref 70–99)
Potassium: 4.1 mmol/L (ref 3.5–5.1)
Sodium: 129 mmol/L — ABNORMAL LOW (ref 135–145)
Total Bilirubin: 0.6 mg/dL (ref 0.3–1.2)
Total Protein: 6.6 g/dL (ref 6.5–8.1)

## 2018-07-22 LAB — INFLUENZA PANEL BY PCR (TYPE A & B)
Influenza A By PCR: NEGATIVE
Influenza B By PCR: NEGATIVE

## 2018-07-22 LAB — CBC WITH DIFFERENTIAL/PLATELET
Abs Immature Granulocytes: 0.08 10*3/uL — ABNORMAL HIGH (ref 0.00–0.07)
Basophils Absolute: 0 10*3/uL (ref 0.0–0.1)
Basophils Relative: 1 %
Eosinophils Absolute: 0 10*3/uL (ref 0.0–0.5)
Eosinophils Relative: 0 %
HEMATOCRIT: 25.6 % — AB (ref 39.0–52.0)
HEMOGLOBIN: 8.9 g/dL — AB (ref 13.0–17.0)
Immature Granulocytes: 1 %
LYMPHS PCT: 10 %
Lymphs Abs: 0.7 10*3/uL (ref 0.7–4.0)
MCH: 28.6 pg (ref 26.0–34.0)
MCHC: 34.8 g/dL (ref 30.0–36.0)
MCV: 82.3 fL (ref 80.0–100.0)
Monocytes Absolute: 0.3 10*3/uL (ref 0.1–1.0)
Monocytes Relative: 5 %
Neutro Abs: 5.7 10*3/uL (ref 1.7–7.7)
Neutrophils Relative %: 83 %
Platelets: 155 10*3/uL (ref 150–400)
RBC: 3.11 MIL/uL — ABNORMAL LOW (ref 4.22–5.81)
RDW: 15 % (ref 11.5–15.5)
WBC: 6.8 10*3/uL (ref 4.0–10.5)
nRBC: 0 % (ref 0.0–0.2)

## 2018-07-22 LAB — SAMPLE TO BLOOD BANK

## 2018-07-22 LAB — PSA: Prostatic Specific Antigen: 196 ng/mL — ABNORMAL HIGH (ref 0.00–4.00)

## 2018-07-22 MED ORDER — SODIUM CHLORIDE 0.9% FLUSH
10.0000 mL | Freq: Once | INTRAVENOUS | Status: AC
  Administered 2018-07-22: 10 mL via INTRAVENOUS
  Filled 2018-07-22: qty 10

## 2018-07-22 MED ORDER — HEPARIN SOD (PORK) LOCK FLUSH 100 UNIT/ML IV SOLN
500.0000 [IU] | Freq: Once | INTRAVENOUS | Status: AC
  Administered 2018-07-22: 500 [IU] via INTRAVENOUS
  Filled 2018-07-22: qty 5

## 2018-07-22 MED ORDER — SODIUM CHLORIDE 0.9 % IV SOLN
Freq: Once | INTRAVENOUS | Status: AC
  Administered 2018-07-22: 1000 mL via INTRAVENOUS
  Filled 2018-07-22: qty 250

## 2018-07-22 NOTE — Progress Notes (Signed)
Hematology/Oncology  Follow up note [facility] Telephone:([phone]) [phone] [phone]) [phone]  Patient Care Team: Maryland Pink, MD as PCP - General (Family Medicine) Earlie Server, MD as Medical Oncologist (Medical Oncology)  REASON FOR VISIT Follow up for antineoplasm treatment of Prostate Cancer  HISTORY OF PRESENTING ILLNESS:  Matthew Hunter 85 y.o.  male with PMH listed as below who was referred by [facility] Urology Provider [name] to me for evaluation of clinical prostate cancer and management.  He has a history of elevated PSA and BPH. PSA trends as follows: 11/14/2014 4.3, 11/07/2015 7.1, 05/11/2016 10.4, 06/11/2016 10.6, 07/07/2016 7.8, 01/06/2017 86.8.  He was seen by urology and was diagnosed with clinical prostate cancer given the extreme elevation of PSA and prostate nodule. Biopsy was not pursued. He was treated with [name] on 01/29/2017,  # prostate biopsy on 03/10/2017. 5 out of 6 cores positive for prostate cancer, with highest gleason score group 9 (4+5), + perineural invasion.  Stage IV prostate cancer, multiple osseus metastatic disease. No visceral involvement.   01/18/2018  CT scan image was independently reviewed by me and discussed with patient.  Patient has progression of bone metastatic disease only  Current treatment  Patient got first [place] on 01/29/2018, switched to leupron on 04/13/2015,  Q6 months.  .Testosterone level at 14, castration level.  Upfront Gillermina Phy was added on December 2019 to assist fast disease control (phase II trial [place] B, [facility] Oncol. 2014) S/p palliative RT to lumber spine and SI joints. Xtandi discontinued on due to CNS toxicity in April 2019.  12/09/17 Abiaterone discontinued.  7/ 18/2019 Start Apalutimide 240mg  daily.   Trend of tumor marker  PSA 86.8 -->64.78 -->131--> 242 added Xtanti --> 17.74-->1.82 Xtandi stopped due to CNS toxicity in April 2019. --> 1.79- (added Abiaterone)-> 11.06-->24.53--> 39.48  [Abiaterone stopped, Added Apalutimid]--> 65.71-->88.63--> Xofigo treatment--> 215--> Back on Xtandi reduced dose.   12/09/17 Abiaterone discontinued due to rising PSA and disease progression. 12/16/2017 Start Apalutimide 240mg  daily. Held on 01/13/2018 due to rising PSA, also causing patient to feel dizziness/black out.  Started Xofigo treatment on 02/23/2018. 02/28/2018, back on Xtandi reduced dose 120mg . Xtandi stopped Dec.2020 due to   05/20/2018 Weekly Docetaxel 2 weeks on one week off.  Finish 6 cycles of Xofigo treatments on 07/13/2018. 1 unit of irradiated PRBC transfusion on 07/08/2018  INTERVAL HISTORY Patient presents to follow up for management of metastatic prostate cancer and follow-up for chemotherapy treatment today.  Infusion center RN called and reports that patient reports feeling very weak today, and shortness of breath.  Patient was added to my schedule for acute visit.   Reports feeling shortness of breath since 2 days ago, some cough, no productive. Very poor appetite and oral intake.  Denies vomiting or diarrhea.  Feels weak.  Also has nasal congestion.No sore throat.   Review of Systems  Constitutional: Positive for fatigue. Negative for appetite change, chills, fever and unexpected weight change.  HENT:   Negative for hearing loss and voice change.   Eyes: Negative for eye problems and icterus.  Respiratory: Negative for chest tightness, cough and shortness of breath.   Cardiovascular: Negative for chest pain and leg swelling.  Gastrointestinal: Negative for abdominal distention, abdominal pain and diarrhea.  Endocrine: Negative for hot flashes.  Genitourinary: Negative for difficulty urinating, dysuria and frequency.   Musculoskeletal: Negative for arthralgias and back pain.  Skin: Negative for itching and rash.  Neurological: Negative for light-headedness and numbness.  Hematological: Negative for adenopathy. Does  not bruise/bleed easily.  Psychiatric/Behavioral:  Negative for confusion.    MEDICAL HISTORY:  Past Medical History:  Diagnosis Date  . Arthritis   . Benign enlargement of prostate   . CAD (coronary artery disease)   . Constipation   . Elevated PSA   . Family history of breast cancer   . Family history of prostate cancer   . GERD (gastroesophageal reflux disease)   . Hypertension   . Incomplete bladder emptying   . Nocturia   . Prostate cancer ([place]) 05/03/2017   Rad tx's and Lupron shots.   . Urgency of micturation   . Urinary frequency     SURGICAL HISTORY: Past Surgical History:  Procedure Laterality Date  . GALLBLADDER SURGERY    . PORTA CATH INSERTION N/A 06/07/2018   Procedure: PORTA CATH INSERTION;  Surgeon: Katha Cabal, MD;  Location: [facility];  Service: Cardiovascular;  Laterality: N/A;  . PROSTATE SURGERY    . STOMACH SURGERY      SOCIAL HISTORY: Social History   Socioeconomic History  . Marital status: Married    Spouse name: Not on file  . Number of children: Not on file  . Years of education: Not on file  . Highest education level: Not on file  Occupational History  . Not on file  Social Needs  . Financial resource strain: Not on file  . Food insecurity:    Worry: Not on file    Inability: Not on file  . Transportation needs:    Medical: Not on file    Non-medical: Not on file  Tobacco Use  . Smoking status: Former Smoker    Last attempt to quit: 06/01/1998    Years since quitting: 20.1  . Smokeless tobacco: Never Used  Substance and Sexual Activity  . Alcohol use: No    Alcohol/week: 0.0 standard drinks  . Drug use: Not Currently    Frequency: 2.0 times per week    Types: Marijuana    Comment: marijuana two time monthly gives him an appetite  . Sexual activity: Not on file  Lifestyle  . Physical activity:    Days per week: Not on file    Minutes per session: Not on file  . Stress: Not on file  Relationships  . Social connections:    Talks on phone: Not on file     Gets together: Not on file    Attends religious service: Not on file    Active member of club or organization: Not on file    Attends meetings of clubs or organizations: Not on file    Relationship status: Not on file  . Intimate partner violence:    Fear of current or ex partner: Not on file    Emotionally abused: Not on file    Physically abused: Not on file    Forced sexual activity: Not on file  Other Topics Concern  . Not on file  Social History Narrative  . Not on file    FAMILY HISTORY: Family History  Problem Relation Age of Onset  . Hypertension Mother   . Stroke Father   . Breast cancer Niece        dx  under 56  . Breast cancer Daughter 54  . Prostate cancer Son 67  . Kidney disease Neg Hx   . Kidney cancer Neg Hx   . Bladder Cancer Neg Hx     ALLERGIES:  has No Known Allergies.  MEDICATIONS:  Current Outpatient  Medications  Medication Sig Dispense Refill  . aspirin EC 81 MG tablet Take by mouth.    Marland Kitchen atenolol (TENORMIN) 100 MG tablet Take 100 mg by mouth daily.     . Calcium Carb-Cholecalciferol (CALCIUM-VITAMIN D) 500-200 MG-UNIT tablet Take 1 tablet by mouth daily. 90 tablet 0  . dexamethasone (DECADRON) 4 MG tablet Take 1 tablet (4 mg total) by mouth daily. 60 tablet 0  . famotidine (PEPCID) 20 MG tablet Take 1 tablet (20 mg total) by mouth daily. 30 tablet 0  . fentaNYL (DURAGESIC) 50 MCG/HR Place 10 patches onto the skin every 3 (three) days. 10 patch 0  . finasteride (PROSCAR) 5 MG tablet Take 1 tablet (5 mg total) by mouth daily. 90 tablet 3  . hydrocortisone 2.5 % cream APP EXT AA BID  0  . lidocaine-prilocaine (EMLA) cream Apply 1 application topically as needed. 30 g 3  . megestrol (MEGACE) 40 MG tablet Take 2 tablets (80 mg total) by mouth 2 (two) times daily. 120 tablet 0  . mirabegron ER (MYRBETRIQ) 25 MG TB24 tablet Take 1 tablet (25 mg total) by mouth daily. 90 tablet 3  . mirtazapine (REMERON) 15 MG tablet Take 15 mg by mouth at bedtime.  11    . mirtazapine (REMERON) 30 MG tablet Take 30 mg by mouth daily.    . nortriptyline (PAMELOR) 25 MG capsule Take 25 mg by mouth at bedtime. Reported on 05/16/2015  1  . nystatin (MYCOSTATIN) 100000 UNIT/ML suspension Take 5 mLs (500,000 Units total) by mouth 3 (three) times daily. 473 mL 0  . omeprazole (PRILOSEC) 20 MG capsule TAKE 1 CAPSULE(20 MG) BY MOUTH DAILY 30 capsule 4  . ondansetron (ZOFRAN) 8 MG tablet Take 1 tablet (8 mg total) by mouth 2 (two) times daily as needed for refractory nausea / vomiting. 30 tablet 1  . Oxycodone HCl 10 MG TABS Take 1 tablet (10 mg total) by mouth every 6 (six) hours as needed. 60 tablet 0  . penicillin v potassium (VEETID) 500 MG tablet Take by mouth.    . polyethylene glycol (MIRALAX / GLYCOLAX) packet Take 17 g by mouth daily. Mix one tablespoon with 8oz of your favorite juice or water every day until you are having soft formed stools. Then start taking once daily if you didn't have a stool the day before. 30 each 0  . prochlorperazine (COMPAZINE) 10 MG tablet Take 1 tablet (10 mg total) by mouth every 6 (six) hours as needed (Nausea or vomiting). 30 tablet 1  . pyridOXINE (VITAMIN B-6) 50 MG tablet Take 50 mg by mouth daily.     Marland Kitchen senna-docusate (SENOKOT-S) 8.6-50 MG tablet Take 2 tablets by mouth daily. 60 tablet 3  . tamsulosin (FLOMAX) 0.4 MG CAPS capsule Take 1 capsule (0.4 mg total) by mouth daily. 90 capsule 3  . Telmisartan-Amlodipine 80-5 MG TABS Take 1 tablet by mouth daily.     Marland Kitchen triamcinolone cream (KENALOG) 0.1 % APP EXT AA BID     No current facility-administered medications for this visit.    Facility-Administered Medications Ordered in Other Visits  Medication Dose Route Frequency Provider Last Rate Last Dose  . sodium chloride flush (NS) 0.9 % injection 10 mL  10 mL Intravenous PRN Earlie Server, MD   10 mL at 07/15/18 0847    PHYSICAL EXAMINATION: ECOG PERFORMANCE STATUS: 1 - Symptomatic but completely ambulatory Vitals:   07/22/18 1104   BP: 117/70  Pulse: 80  Temp: 98.9 F (37.2 C)  SpO2: 100%   Filed Weights   07/22/18 1104  Weight: 160 lb (72.6 kg)   Physical Exam Constitutional:      General: He is not in acute distress.    Appearance: He is ill-appearing. He is not diaphoretic.  HENT:     Head: Normocephalic and atraumatic.     Mouth/Throat:     Pharynx: No oropharyngeal exudate.  Eyes:     General: No scleral icterus.    Conjunctiva/sclera: Conjunctivae normal.     Pupils: Pupils are equal, round, and reactive to light.  Neck:     Musculoskeletal: Normal range of motion and neck supple.     Thyroid: No thyromegaly.  Cardiovascular:     Rate and Rhythm: Normal rate and regular rhythm.     Heart sounds: Normal heart sounds. No murmur.  Pulmonary:     Effort: Pulmonary effort is normal. No respiratory distress.     Breath sounds: Normal breath sounds. No wheezing.  Abdominal:     General: Bowel sounds are normal. There is no distension.     Palpations: Abdomen is soft. There is no mass.     Tenderness: There is no abdominal tenderness.  Musculoskeletal: Normal range of motion.        General: No deformity.  Skin:    General: Skin is warm and dry.     Coloration: Skin is pale.     Findings: No erythema or rash.  Neurological:     Mental Status: He is alert and oriented to person, place, and time.     Cranial Nerves: No cranial nerve deficit.     Coordination: Coordination normal.  Psychiatric:        Behavior: Behavior normal.        Thought Content: Thought content normal.    LABORATORY DATA:  I have reviewed the data as listed Lab Results  Component Value Date   WBC 6.8 07/22/2018   HGB 8.9 (L) 07/22/2018   HCT 25.6 (L) 07/22/2018   MCV 82.3 07/22/2018   PLT 155 07/22/2018   Recent Labs    07/08/18 0841 07/15/18 0847 07/22/18 1013  NA 131* 132* 129*  K 4.3 4.2 4.1  CL 106 104 104  CO2 20* 19* 15*  GLUCOSE 142* 88 107*  BUN 13 23 17   CREATININE 0.95 1.10 1.11  CALCIUM 8.3*  9.3 8.8*  GFRNONAA >60 >60 60*  GFRAA >60 >60 >60  PROT 6.9 7.1 6.6  ALBUMIN 3.4* 3.5 3.3*  AST 30 29 31   ALT 17 21 20   ALKPHOS 34* 36* 38  BILITOT 0.5 0.6 0.6   RADIOGRAPHIC STUDIES: I have personally reviewed the radiological images as listed and agreed with the findings in the report. 12/15/2017 Bone scan whole body showed new sites of osseous tracer accumulation are identified especially to size at the distal left femoral diaphysis, L2 vertebral body, right iliac bone, right ischium. 01/18/2018 chest abdomen pelvis with contrast Extensive metastasis disease to bone, with all bone lesions new from the abdomen and pelvis CT dated 9/5 2018 and a chest CT dated 06/19/2013.  Involvement is similar to the recent bone scan.  No other evidence of metastatic disease.  No acute abnormalities in the chest abdomen and pelvis.  03/14/2018 CT chest Angio PE protocol 1. No acute cardiopulmonary abnormalities. No evidence for acute pulmonary embolus 2. Coronary artery atherosclerotic calcifications 3.  Aortic Atherosclerosis (ICD10-I70.0). 4. Sclerotic bone metastasis as noted previously  03/25/2018 CT abdomen pelvis with contrast 1. No  acute abnormality in the abdomen or pelvis. 2. Extensive metastatic bone disease. Findings are similar to theexam on 01/18/2018.  3. Nonobstructive left kidney stone. 4. Stable left adrenal nodule.  ASSESSMENT & PLAN: 85 y.o. male follows up for management of metastatic prostate cancer and neoplasm related symptoms..  1. Nasal sinus congestion   2. Shortness of breath   3. Prostate cancer ([place])   4. 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 ADT has been given by urology office.  PSA has decreased to 163.  Finished 6 cycles of Xofigo treatment.   Hold chemotherapy due to fatigue and weakness.   # SOB/nasal congestion/cough, check stat CXR, cbc, CMP, UA and urine culture, influenza.  Labs and image were reviewed. CXR no  acute process.  Hyponatremia, likely dehyrdration.  Will proceed with 1 L of IV normal saline.   I evaluated patient after he finishes IV fluid sesstion. Feels much better. He will return to cancer center for repeat labs on 2/07/05/2018, IV fluid and possible blood transfusion.   Return of visit: Labs CBC CMP in 3 days Follow up in 1 week for reassessment.   Earlie Server, MD, PhD Hematology Oncology [facility] at [facility] Pager- [phone] 07/22/2018

## 2018-07-22 NOTE — Progress Notes (Signed)
Patient experiencing shortness of breath with minimal exertion today. Patient states, "I have been feeling really bad lately. I am short of breath and I haven't been eating or drinking good. I don't have an appetite." Vital signs obtained, see flow sheet. MD, Dr. Tasia Catchings, notified and aware. Per MD order: MD visit being added on and patient to be further evaluated by Dr. Tasia Catchings today.

## 2018-07-22 NOTE — Progress Notes (Signed)
Patient here today for chemotherapy. Patient c/o SOB and no appetite.

## 2018-07-23 LAB — URINE CULTURE: Culture: NO GROWTH

## 2018-07-25 ENCOUNTER — Inpatient Hospital Stay: Payer: Medicare Other

## 2018-07-25 DIAGNOSIS — Z5111 Encounter for antineoplastic chemotherapy: Secondary | ICD-10-CM | POA: Diagnosis not present

## 2018-07-25 DIAGNOSIS — C61 Malignant neoplasm of prostate: Secondary | ICD-10-CM

## 2018-07-25 LAB — COMPREHENSIVE METABOLIC PANEL
ALK PHOS: 33 U/L — AB (ref 38–126)
ALT: 20 U/L (ref 0–44)
ANION GAP: 11 (ref 5–15)
AST: 34 U/L (ref 15–41)
Albumin: 3.2 g/dL — ABNORMAL LOW (ref 3.5–5.0)
BUN: 16 mg/dL (ref 8–23)
CO2: 15 mmol/L — ABNORMAL LOW (ref 22–32)
Calcium: 8.5 mg/dL — ABNORMAL LOW (ref 8.9–10.3)
Chloride: 103 mmol/L (ref 98–111)
Creatinine, Ser: 1.1 mg/dL (ref 0.61–1.24)
GFR calc Af Amer: >60 mL/min (ref >60)
GFR calc non Af Amer: >60 mL/min (ref >60)
Glucose, Bld: 123 mg/dL — ABNORMAL HIGH (ref 70–99)
Potassium: 3.8 mmol/L (ref 3.5–5.1)
Sodium: 129 mmol/L — ABNORMAL LOW (ref 135–145)
Total Bilirubin: 0.5 mg/dL (ref 0.3–1.2)
Total Protein: 6.7 g/dL (ref 6.5–8.1)

## 2018-07-25 LAB — CBC WITH DIFFERENTIAL/PLATELET
Abs Immature Granulocytes: 0.04 10*3/uL (ref 0.00–0.07)
Basophils Absolute: 0 10*3/uL (ref 0.0–0.1)
Basophils Relative: 0 %
EOS PCT: 0 %
Eosinophils Absolute: 0 10*3/uL (ref 0.0–0.5)
HCT: 24.8 % — ABNORMAL LOW (ref 39.0–52.0)
Hemoglobin: 8.8 g/dL — ABNORMAL LOW (ref 13.0–17.0)
Immature Granulocytes: 1 %
Lymphocytes Relative: 14 %
Lymphs Abs: 0.7 10*3/uL (ref 0.7–4.0)
MCH: 28.9 pg (ref 26.0–34.0)
MCHC: 35.5 g/dL (ref 30.0–36.0)
MCV: 81.6 fL (ref 80.0–100.0)
MONO ABS: 0.5 10*3/uL (ref 0.1–1.0)
Monocytes Relative: 10 %
Neutro Abs: 3.6 10*3/uL (ref 1.7–7.7)
Neutrophils Relative %: 75 %
Platelets: 152 10*3/uL (ref 150–400)
RBC: 3.04 MIL/uL — ABNORMAL LOW (ref 4.22–5.81)
RDW: 14.9 % (ref 11.5–15.5)
WBC: 4.8 10*3/uL (ref 4.0–10.5)
nRBC: 0 % (ref 0.0–0.2)

## 2018-07-25 MED ORDER — HEPARIN SOD (PORK) LOCK FLUSH 100 UNIT/ML IV SOLN
500.0000 [IU] | Freq: Once | INTRAVENOUS | Status: AC
  Administered 2018-07-25: 500 [IU] via INTRAVENOUS

## 2018-07-25 MED ORDER — SODIUM CHLORIDE 0.9% FLUSH
10.0000 mL | Freq: Once | INTRAVENOUS | Status: AC
  Administered 2018-07-25: 10 mL via INTRAVENOUS
  Filled 2018-07-25: qty 10

## 2018-07-25 MED ORDER — SODIUM CHLORIDE 0.9 % IV SOLN
Freq: Once | INTRAVENOUS | Status: AC
  Administered 2018-07-25: 10:00:00 via INTRAVENOUS
  Filled 2018-07-25: qty 250

## 2018-07-27 ENCOUNTER — Other Ambulatory Visit: Payer: Self-pay

## 2018-07-27 ENCOUNTER — Ambulatory Visit
Admission: RE | Admit: 2018-07-27 | Discharge: 2018-07-27 | Disposition: A | Discharge status: Home / Self Care | Payer: Medicare Other | Source: Ambulatory Visit | Attending: Radiation Oncology | Admitting: Radiation Oncology

## 2018-07-27 ENCOUNTER — Encounter: Payer: Self-pay | Admitting: Radiation Oncology

## 2018-07-27 ENCOUNTER — Other Ambulatory Visit: Payer: Self-pay | Admitting: *Deleted

## 2018-07-27 VITALS — BP 115/63 | HR 84 | Temp 97.8°F | Resp 18 | Wt 163.5 lb

## 2018-07-27 DIAGNOSIS — C7951 Secondary malignant neoplasm of bone: Secondary | ICD-10-CM

## 2018-07-27 DIAGNOSIS — C61 Malignant neoplasm of prostate: Secondary | ICD-10-CM

## 2018-07-27 NOTE — Progress Notes (Signed)
Radiation Oncology Follow up Note  Name: Matthew Hunter   Date:   07/27/2018 MRN:  [id number] DOB: 1930/05/23    This 85 y.o. male presents to the clinic today for one-month follow-up status post external beam radiation therapy to his ribs as well as completion ofXofigo for castrate resistant stage IV prostate cancer.  REFERRING PROVIDER: Maryland Pink, MD  HPI: patient is a 85 year old male now out 1 month having completed external beam palliative radiation therapy to hypofractionated course for metastatic disease to his ribs. He's also completed Xofigoinfusions over 6 months. Seen today he is pain-free. He specifically denies any new areas of pain. He feels quite fatigued.e is currently being treated by medical oncology withWeekly Docetaxel 2 weeks on one week off. He is currently on hold based on fatigue.  COMPLICATIONS OF TREATMENT: none  FOLLOW UP COMPLIANCE: keeps appointments   PHYSICAL EXAM:  BP 115/63   Pulse 84   Temp 97.8 F (36.6 C)   Resp 18   Wt 163 lb 7.5 oz (74.2 kg)   BMI 24.14 kg/m  Well-developed well-nourished patient in NAD. HEENT reveals PERLA, EOMI, discs not visualized.  Oral cavity is clear. No oral mucosal lesions are identified. Neck is clear without evidence of cervical or supraclavicular adenopathy. Lungs are clear to A&P. Cardiac examination is essentially unremarkable with regular rate and rhythm without murmur rub or thrill. Abdomen is benign with no organomegaly or masses noted. Motor sensory and DTR levels are equal and symmetric in the upper and lower extremities. Cranial nerves II through XII are grossly intact. Proprioception is intact. No peripheral adenopathy or edema is identified. No motor or sensory levels are noted. Crude visual fields are within normal range.  RADIOLOGY RESULTS: no current films for review  PLAN: present time patient is doing well with no significant pain from his castrate resistant stage IV prostate cancer. At present time am  please was overall progress. He continues current management by medical oncology. I have asked to see him back in 4 months for follow-up will have a bone scan prior to that visit. Patient knows to call with any concerns.  I would like to take this opportunity to thank you for allowing me to participate in the care of your patient.Noreene Filbert, MD

## 2018-07-29 ENCOUNTER — Inpatient Hospital Stay: Payer: Medicare Other

## 2018-07-29 ENCOUNTER — Encounter: Payer: Self-pay | Admitting: *Deleted

## 2018-07-29 ENCOUNTER — Inpatient Hospital Stay (HOSPITAL_BASED_OUTPATIENT_CLINIC_OR_DEPARTMENT_OTHER): Payer: Medicare Other | Admitting: Oncology

## 2018-07-29 ENCOUNTER — Inpatient Hospital Stay
Admission: AD | Admit: 2018-07-29 | Discharge: 2018-08-01 | DRG: 193 | Disposition: A | Discharge status: Home / Self Care | Payer: Medicare Other | Source: Ambulatory Visit | Attending: Internal Medicine | Admitting: Internal Medicine

## 2018-07-29 ENCOUNTER — Other Ambulatory Visit: Payer: Self-pay

## 2018-07-29 ENCOUNTER — Encounter: Payer: Self-pay | Admitting: Oncology

## 2018-07-29 ENCOUNTER — Ambulatory Visit
Admission: RE | Admit: 2018-07-29 | Discharge: 2018-07-29 | Disposition: A | Discharge status: Home / Self Care | Payer: Medicare Other | Source: Ambulatory Visit | Attending: Oncology | Admitting: Oncology

## 2018-07-29 VITALS — BP 96/56 | HR 89 | Temp 96.1°F | Resp 18 | Wt 163.0 lb

## 2018-07-29 DIAGNOSIS — G893 Neoplasm related pain (acute) (chronic): Secondary | ICD-10-CM

## 2018-07-29 DIAGNOSIS — I10 Essential (primary) hypertension: Secondary | ICD-10-CM | POA: Diagnosis present

## 2018-07-29 DIAGNOSIS — C61 Malignant neoplasm of prostate: Secondary | ICD-10-CM | POA: Diagnosis present

## 2018-07-29 DIAGNOSIS — J189 Pneumonia, unspecified organism: Secondary | ICD-10-CM

## 2018-07-29 DIAGNOSIS — N400 Enlarged prostate without lower urinary tract symptoms: Secondary | ICD-10-CM | POA: Diagnosis present

## 2018-07-29 DIAGNOSIS — C7951 Secondary malignant neoplasm of bone: Secondary | ICD-10-CM | POA: Diagnosis present

## 2018-07-29 DIAGNOSIS — Z8249 Family history of ischemic heart disease and other diseases of the circulatory system: Secondary | ICD-10-CM

## 2018-07-29 DIAGNOSIS — Z8042 Family history of malignant neoplasm of prostate: Secondary | ICD-10-CM

## 2018-07-29 DIAGNOSIS — Z87891 Personal history of nicotine dependence: Secondary | ICD-10-CM

## 2018-07-29 DIAGNOSIS — Z7982 Long term (current) use of aspirin: Secondary | ICD-10-CM | POA: Diagnosis not present

## 2018-07-29 DIAGNOSIS — M1990 Unspecified osteoarthritis, unspecified site: Secondary | ICD-10-CM | POA: Diagnosis present

## 2018-07-29 DIAGNOSIS — Z8546 Personal history of malignant neoplasm of prostate: Secondary | ICD-10-CM | POA: Diagnosis not present

## 2018-07-29 DIAGNOSIS — Z6823 Body mass index (BMI) 23.0-23.9, adult: Secondary | ICD-10-CM

## 2018-07-29 DIAGNOSIS — Z79899 Other long term (current) drug therapy: Secondary | ICD-10-CM

## 2018-07-29 DIAGNOSIS — R0981 Nasal congestion: Secondary | ICD-10-CM

## 2018-07-29 DIAGNOSIS — Z823 Family history of stroke: Secondary | ICD-10-CM

## 2018-07-29 DIAGNOSIS — E43 Unspecified severe protein-calorie malnutrition: Secondary | ICD-10-CM | POA: Diagnosis present

## 2018-07-29 DIAGNOSIS — K219 Gastro-esophageal reflux disease without esophagitis: Secondary | ICD-10-CM | POA: Diagnosis present

## 2018-07-29 DIAGNOSIS — R0602 Shortness of breath: Secondary | ICD-10-CM

## 2018-07-29 DIAGNOSIS — I2510 Atherosclerotic heart disease of native coronary artery without angina pectoris: Secondary | ICD-10-CM | POA: Diagnosis present

## 2018-07-29 DIAGNOSIS — Z803 Family history of malignant neoplasm of breast: Secondary | ICD-10-CM

## 2018-07-29 LAB — CBC WITH DIFFERENTIAL/PLATELET
Abs Immature Granulocytes: 0.03 10*3/uL (ref 0.00–0.07)
BASOS PCT: 0 %
Basophils Absolute: 0 10*3/uL (ref 0.0–0.1)
Eosinophils Absolute: 0.1 10*3/uL (ref 0.0–0.5)
Eosinophils Relative: 1 %
HCT: 26.1 % — ABNORMAL LOW (ref 39.0–52.0)
Hemoglobin: 8.9 g/dL — ABNORMAL LOW (ref 13.0–17.0)
Immature Granulocytes: 1 %
Lymphocytes Relative: 20 %
Lymphs Abs: 0.8 10*3/uL (ref 0.7–4.0)
MCH: 27.9 pg (ref 26.0–34.0)
MCHC: 34.1 g/dL (ref 30.0–36.0)
MCV: 81.8 fL (ref 80.0–100.0)
Monocytes Absolute: 0.5 10*3/uL (ref 0.1–1.0)
Monocytes Relative: 13 %
NRBC: 0 % (ref 0.0–0.2)
Neutro Abs: 2.5 10*3/uL (ref 1.7–7.7)
Neutrophils Relative %: 65 %
Platelets: 131 10*3/uL — ABNORMAL LOW (ref 150–400)
RBC: 3.19 MIL/uL — ABNORMAL LOW (ref 4.22–5.81)
RDW: 15.5 % (ref 11.5–15.5)
WBC: 3.9 10*3/uL — ABNORMAL LOW (ref 4.0–10.5)

## 2018-07-29 LAB — COMPREHENSIVE METABOLIC PANEL
ALT: 22 U/L (ref 0–44)
AST: 35 U/L (ref 15–41)
Albumin: 2.9 g/dL — ABNORMAL LOW (ref 3.5–5.0)
Alkaline Phosphatase: 35 U/L — ABNORMAL LOW (ref 38–126)
Anion gap: 7 (ref 5–15)
BILIRUBIN TOTAL: 0.5 mg/dL (ref 0.3–1.2)
BUN: 11 mg/dL (ref 8–23)
CO2: 17 mmol/L — ABNORMAL LOW (ref 22–32)
Calcium: 8 mg/dL — ABNORMAL LOW (ref 8.9–10.3)
Chloride: 105 mmol/L (ref 98–111)
Creatinine, Ser: 1.02 mg/dL (ref 0.61–1.24)
GFR calc Af Amer: >60 mL/min (ref >60)
GFR calc non Af Amer: >60 mL/min (ref >60)
Glucose, Bld: 133 mg/dL — ABNORMAL HIGH (ref 70–99)
Potassium: 3.6 mmol/L (ref 3.5–5.1)
Sodium: 129 mmol/L — ABNORMAL LOW (ref 135–145)
Total Protein: 6.4 g/dL — ABNORMAL LOW (ref 6.5–8.1)

## 2018-07-29 LAB — RESPIRATORY PANEL BY PCR
Adenovirus: NOT DETECTED
Bordetella pertussis: NOT DETECTED
CORONAVIRUS 229E-RVPPCR: NOT DETECTED
CORONAVIRUS HKU1-RVPPCR: NOT DETECTED
Chlamydophila pneumoniae: NOT DETECTED
Coronavirus NL63: NOT DETECTED
Coronavirus OC43: NOT DETECTED
Influenza A: NOT DETECTED
Influenza B: NOT DETECTED
Metapneumovirus: NOT DETECTED
Mycoplasma pneumoniae: NOT DETECTED
PARAINFLUENZA VIRUS 1-RVPPCR: NOT DETECTED
Parainfluenza Virus 2: NOT DETECTED
Parainfluenza Virus 3: NOT DETECTED
Parainfluenza Virus 4: NOT DETECTED
Respiratory Syncytial Virus: NOT DETECTED
Rhinovirus / Enterovirus: NOT DETECTED

## 2018-07-29 LAB — INFLUENZA PANEL BY PCR (TYPE A & B)
INFLAPCR: NEGATIVE
Influenza B By PCR: NEGATIVE

## 2018-07-29 LAB — PSA: Prostatic Specific Antigen: 195 ng/mL — ABNORMAL HIGH (ref 0.00–4.00)

## 2018-07-29 LAB — SAMPLE TO BLOOD BANK

## 2018-07-29 MED ORDER — FAMOTIDINE 20 MG PO TABS
20.0000 mg | ORAL_TABLET | Freq: Every day | ORAL | Status: DC
  Administered 2018-07-30 – 2018-08-01 (×3): 20 mg via ORAL
  Filled 2018-07-29 (×3): qty 1

## 2018-07-29 MED ORDER — AMLODIPINE BESYLATE 5 MG PO TABS
5.0000 mg | ORAL_TABLET | Freq: Every day | ORAL | Status: DC
  Filled 2018-07-29: qty 1

## 2018-07-29 MED ORDER — ENOXAPARIN SODIUM 40 MG/0.4ML ~~LOC~~ SOLN
40.0000 mg | SUBCUTANEOUS | Status: DC
  Administered 2018-07-29 – 2018-07-31 (×3): 40 mg via SUBCUTANEOUS
  Filled 2018-07-29 (×3): qty 0.4

## 2018-07-29 MED ORDER — POLYETHYLENE GLYCOL 3350 17 G PO PACK
17.0000 g | PACK | Freq: Every day | ORAL | Status: DC
  Administered 2018-07-30 – 2018-08-01 (×3): 17 g via ORAL
  Filled 2018-07-29 (×3): qty 1

## 2018-07-29 MED ORDER — MIRTAZAPINE 15 MG PO TABS
30.0000 mg | ORAL_TABLET | Freq: Every day | ORAL | Status: DC
  Administered 2018-07-29: 22:00:00 30 mg via ORAL
  Filled 2018-07-29: qty 2

## 2018-07-29 MED ORDER — FINASTERIDE 5 MG PO TABS
5.0000 mg | ORAL_TABLET | Freq: Every day | ORAL | Status: DC
  Administered 2018-07-30 – 2018-08-01 (×3): 5 mg via ORAL
  Filled 2018-07-29 (×3): qty 1

## 2018-07-29 MED ORDER — LEVOFLOXACIN IN D5W 750 MG/150ML IV SOLN
750.0000 mg | INTRAVENOUS | Status: DC
  Administered 2018-07-29: 750 mg via INTRAVENOUS
  Filled 2018-07-29: qty 150

## 2018-07-29 MED ORDER — VITAMIN B-6 50 MG PO TABS
50.0000 mg | ORAL_TABLET | Freq: Every day | ORAL | Status: DC
  Administered 2018-07-30 – 2018-08-01 (×3): 50 mg via ORAL
  Filled 2018-07-29 (×4): qty 1

## 2018-07-29 MED ORDER — ACETAMINOPHEN 650 MG RE SUPP: 650.0000 mg | Freq: Four times a day (QID) | RECTAL | Status: DC | PRN

## 2018-07-29 MED ORDER — ONDANSETRON HCL 4 MG/2ML IJ SOLN: 4.0000 mg | Freq: Four times a day (QID) | INTRAMUSCULAR | Status: DC | PRN

## 2018-07-29 MED ORDER — DEXAMETHASONE 4 MG PO TABS
4.0000 mg | ORAL_TABLET | Freq: Every day | ORAL | Status: DC
  Administered 2018-07-30 – 2018-08-01 (×3): 4 mg via ORAL
  Filled 2018-07-29 (×3): qty 1

## 2018-07-29 MED ORDER — SODIUM CHLORIDE 0.9% FLUSH
10.0000 mL | Freq: Once | INTRAVENOUS | Status: AC | PRN
  Administered 2018-07-29: 10 mL
  Filled 2018-07-29: qty 10

## 2018-07-29 MED ORDER — MEGESTROL ACETATE 20 MG PO TABS
80.0000 mg | ORAL_TABLET | Freq: Two times a day (BID) | ORAL | Status: DC
  Administered 2018-07-29 – 2018-08-01 (×6): 80 mg via ORAL
  Filled 2018-07-29 (×8): qty 4

## 2018-07-29 MED ORDER — ONDANSETRON HCL 4 MG PO TABS: 4.0000 mg | ORAL_TABLET | Freq: Four times a day (QID) | ORAL | Status: DC | PRN

## 2018-07-29 MED ORDER — TAMSULOSIN HCL 0.4 MG PO CAPS
0.4000 mg | ORAL_CAPSULE | Freq: Every day | ORAL | Status: DC
  Administered 2018-07-30 – 2018-08-01 (×3): 0.4 mg via ORAL
  Filled 2018-07-29 (×3): qty 1

## 2018-07-29 MED ORDER — SENNOSIDES-DOCUSATE SODIUM 8.6-50 MG PO TABS
2.0000 | ORAL_TABLET | Freq: Every day | ORAL | Status: DC
  Administered 2018-07-30 – 2018-08-01 (×3): 2 via ORAL
  Filled 2018-07-29 (×3): qty 2

## 2018-07-29 MED ORDER — MIRABEGRON ER 25 MG PO TB24
25.0000 mg | ORAL_TABLET | Freq: Every day | ORAL | Status: DC
  Administered 2018-07-30 – 2018-08-01 (×3): 25 mg via ORAL
  Filled 2018-07-29 (×4): qty 1

## 2018-07-29 MED ORDER — IRBESARTAN 150 MG PO TABS
300.0000 mg | ORAL_TABLET | Freq: Every day | ORAL | Status: DC
  Administered 2018-07-30: 10:00:00 300 mg via ORAL
  Filled 2018-07-29: qty 2

## 2018-07-29 MED ORDER — ONDANSETRON HCL 4 MG PO TABS: 8.0000 mg | ORAL_TABLET | Freq: Two times a day (BID) | ORAL | Status: DC | PRN

## 2018-07-29 MED ORDER — MIRTAZAPINE 15 MG PO TABS
15.0000 mg | ORAL_TABLET | Freq: Every day | ORAL | Status: DC
  Administered 2018-07-29 – 2018-07-31 (×3): 15 mg via ORAL
  Filled 2018-07-29 (×3): qty 1

## 2018-07-29 MED ORDER — IOPAMIDOL (ISOVUE-370) INJECTION 76%
75.0000 mL | Freq: Once | INTRAVENOUS | Status: AC | PRN
  Administered 2018-07-29: 75 mL via INTRAVENOUS

## 2018-07-29 MED ORDER — ATENOLOL 100 MG PO TABS
100.0000 mg | ORAL_TABLET | Freq: Every day | ORAL | Status: DC
  Filled 2018-07-29 (×2): qty 1

## 2018-07-29 MED ORDER — HEPARIN SOD (PORK) LOCK FLUSH 100 UNIT/ML IV SOLN
500.0000 [IU] | Freq: Once | INTRAVENOUS | Status: AC | PRN
  Administered 2018-07-29: 500 [IU]
  Filled 2018-07-29: qty 5

## 2018-07-29 MED ORDER — SODIUM CHLORIDE 0.9 % IV SOLN
Freq: Once | INTRAVENOUS | Status: AC
  Administered 2018-07-29: 10:00:00 via INTRAVENOUS
  Filled 2018-07-29: qty 250

## 2018-07-29 MED ORDER — ASPIRIN EC 81 MG PO TBEC
81.0000 mg | DELAYED_RELEASE_TABLET | Freq: Every day | ORAL | Status: DC
  Administered 2018-07-30 – 2018-08-01 (×3): 81 mg via ORAL
  Filled 2018-07-29 (×3): qty 1

## 2018-07-29 MED ORDER — NORTRIPTYLINE HCL 25 MG PO CAPS
25.0000 mg | ORAL_CAPSULE | Freq: Every day | ORAL | Status: DC
  Administered 2018-07-29: 21:00:00 25 mg via ORAL
  Filled 2018-07-29 (×2): qty 1

## 2018-07-29 MED ORDER — PANTOPRAZOLE SODIUM 40 MG PO TBEC
40.0000 mg | DELAYED_RELEASE_TABLET | Freq: Every day | ORAL | Status: DC
  Administered 2018-07-30 – 2018-08-01 (×3): 40 mg via ORAL
  Filled 2018-07-29 (×3): qty 1

## 2018-07-29 MED ORDER — OXYCODONE HCL 5 MG PO TABS
10.0000 mg | ORAL_TABLET | Freq: Four times a day (QID) | ORAL | Status: DC | PRN
  Administered 2018-07-31: 21:00:00 10 mg via ORAL
  Filled 2018-07-29: qty 2

## 2018-07-29 MED ORDER — ACETAMINOPHEN 325 MG PO TABS: 650.0000 mg | ORAL_TABLET | Freq: Four times a day (QID) | ORAL | Status: DC | PRN

## 2018-07-29 MED ORDER — TELMISARTAN-AMLODIPINE 80-5 MG PO TABS: 1.0000 | ORAL_TABLET | Freq: Every day | ORAL | Status: DC

## 2018-07-29 MED ORDER — PROCHLORPERAZINE MALEATE 10 MG PO TABS
10.0000 mg | ORAL_TABLET | Freq: Four times a day (QID) | ORAL | Status: DC | PRN
  Filled 2018-07-29: qty 1

## 2018-07-29 MED ORDER — CALCIUM CARBONATE-VITAMIN D 500-200 MG-UNIT PO TABS
1.0000 | ORAL_TABLET | Freq: Every day | ORAL | Status: DC
  Administered 2018-07-30 – 2018-08-01 (×3): 1 via ORAL
  Filled 2018-07-29 (×3): qty 1

## 2018-07-29 MED ORDER — SODIUM CHLORIDE 0.9 % IV SOLN
INTRAVENOUS | Status: DC
  Administered 2018-07-29 – 2018-08-01 (×5): via INTRAVENOUS

## 2018-07-29 MED ORDER — MIRTAZAPINE 15 MG PO TABS: 30.0000 mg | ORAL_TABLET | Freq: Every day | ORAL | Status: DC

## 2018-07-29 MED ORDER — FENTANYL 50 MCG/HR TD PT72: 10.0000 | MEDICATED_PATCH | TRANSDERMAL | Status: DC

## 2018-07-29 NOTE — Consult Note (Signed)
Pharmacy Antibiotic Note  Matthew Hunter is a 85 y.o. male admitted on 07/29/2018 with pneumonia.  Pharmacy has been consulted for Levaquin dosing.  Plan: Levaquin 750mg  IV q 24hr   Temp (24hrs), Avg:96.1 F (35.6 C), Min:96.1 F (35.6 C), Max:96.1 F (35.6 C)  Recent Labs  Lab 07/25/18 0912 07/29/18 0845  WBC 4.8 3.9*  CREATININE 1.10 1.02    Estimated Creatinine Clearance: 52 mL/min (by C-G formula based on SCr of 1.02 mg/dL).    No Known Allergies  Antimicrobials this admission: Levaquin 2/28 >>   Dose adjustments this admission: None  Microbiology results: 2/28 BCx: pending 2/28 Sputum: pending  Thank you for allowing pharmacy to be a part of this patient's care.  Lu Duffel, PharmD, BCPS Clinical Pharmacist 07/29/2018 5:44 PM

## 2018-07-29 NOTE — Progress Notes (Signed)
Advanced care plan.  Purpose of the Encounter: CODE STATUS  Parties in Attendance: Patient himself and wife  Patient's Decision Capacity: Intact  Subjective/Patient's story:  Patient is 85 year old with metastatic prostate cancer, hypertension, GERD presenting with weakness cough shortness of breath noted to have multifocal pneumonia  Objective/Medical story  I discussed with the patient regarding his desires for cardiac and pulmonary resuscitation explained him what that entailed  Goals of care determination:   Patient states that he would not want to be on the mechanical ventilator but would want other intervention CODE STATUS: Limited code with no intubation   Time spent discussing advanced care planning: 16 minutes

## 2018-07-29 NOTE — Progress Notes (Signed)
Patient here for follow up. States he still not feeling good. He feel good after blood transfusion and then slowly starts to feel bad again. Denies N/V or diarrhea.

## 2018-07-29 NOTE — H&P (Signed)
[facility] NAME: Matthew Hunter    MR#:  [id number]  DATE OF BIRTH:  [birth year]  DATE OF ADMISSION:  07/29/2018  PRIMARY CARE PHYSICIAN: Maryland Pink, MD   REQUESTING/REFERRING PHYSICIAN: Earlie Server MD  CHIEF COMPLAINT:  No chief complaint on file.   HISTORY OF PRESENT ILLNESS: Matthew Hunter  is a 85 y.o. male with a known history of osteoarthritis, prostate cancer with mets, GERD, hypertension who is presenting to the hospital with complaint of generalized weakness shortness of breath and cough.  Patient states that he has had progressive weakness over the past few months.  The cough and shortness of breath has recently gotten worse.  Patient was seen by his oncologist and underwent a CT scan which showed a multifocal pneumonia therefore history referred for admission.  Patient complains of chills not sure about fever.  Denies any nausea vomiting or diarrhea. PAST MEDICAL HISTORY:   Past Medical History:  Diagnosis Date  . Arthritis   . Benign enlargement of prostate   . CAD (coronary artery disease)   . Constipation   . Elevated PSA   . Family history of breast cancer   . Family history of prostate cancer   . GERD (gastroesophageal reflux disease)   . Hypertension   . Incomplete bladder emptying   . Nocturia   . Prostate cancer ([place]) 05/03/2017   Rad tx's and Lupron shots.   . Urgency of micturation   . Urinary frequency     PAST SURGICAL HISTORY:  Past Surgical History:  Procedure Laterality Date  . GALLBLADDER SURGERY    . PORTA CATH INSERTION N/A 06/07/2018   Procedure: PORTA CATH INSERTION;  Surgeon: Katha Cabal, MD;  Location: [facility];  Service: Cardiovascular;  Laterality: N/A;  . PROSTATE SURGERY    . STOMACH SURGERY      SOCIAL HISTORY:  Social History   Tobacco Use  . Smoking status: Former Smoker    Last attempt to quit: 06/01/1998    Years since quitting: 20.1  . Smokeless tobacco:  Never Used  Substance Use Topics  . Alcohol use: No    Alcohol/week: 0.0 standard drinks    FAMILY HISTORY:  Family History  Problem Relation Age of Onset  . Hypertension Mother   . Stroke Father   . Breast cancer Niece        dx  under 63  . Breast cancer Daughter 35  . Prostate cancer Son 51  . Kidney disease Neg Hx   . Kidney cancer Neg Hx   . Bladder Cancer Neg Hx     DRUG ALLERGIES: No Known Allergies  REVIEW OF SYSTEMS:   CONSTITUTIONAL: No fever, positive fatigue or positive weakness.  EYES: No blurred or double vision.  EARS, NOSE, AND THROAT: No tinnitus or ear pain.  RESPIRATORY: No cough, positive shortness of breath, wheezing or hemoptysis.  CARDIOVASCULAR: No chest pain, orthopnea, edema.  GASTROINTESTINAL: No nausea, vomiting, diarrhea or abdominal pain.  GENITOURINARY: No dysuria, hematuria.  ENDOCRINE: No polyuria, nocturia,  HEMATOLOGY: No anemia, easy bruising or bleeding SKIN: No rash or lesion. MUSCULOSKELETAL: No joint pain or arthritis.   NEUROLOGIC: No tingling, numbness, weakness.  PSYCHIATRY: No anxiety or depression.   MEDICATIONS AT HOME:  Prior to Admission medications   Medication Sig Start Date End Date Taking? Authorizing Provider  aspirin EC 81 MG tablet Take by mouth.    [provider]  atenolol (TENORMIN) 100  MG tablet Take 100 mg by mouth daily.     [provider]  Calcium Carb-Cholecalciferol (CALCIUM-VITAMIN D) 500-200 MG-UNIT tablet Take 1 tablet by mouth daily. 12/30/17   Earlie Server, MD  dexamethasone (DECADRON) 4 MG tablet Take 1 tablet (4 mg total) by mouth daily. 06/03/18   Earlie Server, MD  famotidine (PEPCID) 20 MG tablet Take 1 tablet (20 mg total) by mouth daily. 07/04/18 07/04/19  Schuyler Amor, MD  fentaNYL (DURAGESIC) 50 MCG/HR Place 10 patches onto the skin every 3 (three) days. 07/20/18   Earlie Server, MD  finasteride (PROSCAR) 5 MG tablet Take 1 tablet (5 mg total) by mouth daily. 06/11/16   [name] A, PA-C   hydrocortisone 2.5 % cream APP EXT AA BID 09/02/17   [provider]  lidocaine-prilocaine (EMLA) cream Apply 1 application topically as needed. 06/17/18   Earlie Server, MD  megestrol (MEGACE) 40 MG tablet Take 2 tablets (80 mg total) by mouth 2 (two) times daily. 03/22/18   Earlie Server, MD  mirabegron ER (MYRBETRIQ) 25 MG TB24 tablet Take 1 tablet (25 mg total) by mouth daily. 05/04/17   [name] A, PA-C  mirtazapine (REMERON) 15 MG tablet Take 15 mg by mouth at bedtime. 03/31/18   [provider]  mirtazapine (REMERON) 30 MG tablet Take 30 mg by mouth daily. 05/09/18   [provider]  nortriptyline (PAMELOR) 25 MG capsule Take 25 mg by mouth at bedtime. Reported on 05/16/2015 10/30/14   [provider]  nystatin (MYCOSTATIN) 100000 UNIT/ML suspension Take 5 mLs (500,000 Units total) by mouth 3 (three) times daily. 07/15/18   Earlie Server, MD  omeprazole (PRILOSEC) 20 MG capsule TAKE 1 CAPSULE(20 MG) BY MOUTH DAILY 06/23/18   Earlie Server, MD  ondansetron (ZOFRAN) 8 MG tablet Take 1 tablet (8 mg total) by mouth 2 (two) times daily as needed for refractory nausea / vomiting. 05/12/18   Earlie Server, MD  Oxycodone HCl 10 MG TABS Take 1 tablet (10 mg total) by mouth every 6 (six) hours as needed. 06/17/18   Earlie Server, MD  polyethylene glycol [facility] / Floria Raveling) packet Take 17 g by mouth daily. Mix one tablespoon with 8oz of your favorite juice or water every day until you are having soft formed stools. Then start taking once daily if you didn't have a stool the day before. 02/26/18   Merlyn Lot, MD  prochlorperazine (COMPAZINE) 10 MG tablet Take 1 tablet (10 mg total) by mouth every 6 (six) hours as needed (Nausea or vomiting). 05/12/18   Earlie Server, MD  pyridOXINE (VITAMIN B-6) 50 MG tablet Take 50 mg by mouth daily.     [provider]  senna-docusate (SENOKOT-S) 8.6-50 MG tablet Take 2 tablets by mouth daily. 04/12/18   Earlie Server, MD  tamsulosin (FLOMAX) 0.4 MG CAPS  capsule Take 1 capsule (0.4 mg total) by mouth daily. 07/19/17   McGowan, Larene Beach A, PA-C  Telmisartan-Amlodipine 80-5 MG TABS Take 1 tablet by mouth daily.     [provider]  triamcinolone cream (KENALOG) 0.1 % APP EXT AA BID 05/09/18   [provider]      PHYSICAL EXAMINATION:   VITAL SIGNS: There were no vitals taken for this visit.  GENERAL:  85 y.o.-year-old patient lying in the bed chronically ill-appearing EYES: Pupils equal, round, reactive to light and accommodation. No scleral icterus. Extraocular muscles intact.  HEENT: Head atraumatic, normocephalic. Oropharynx and nasopharynx clear.  NECK:  Supple, no jugular venous  distention. No thyroid enlargement, no tenderness.  LUNGS: Rhonchus breath sounds bilaterally without any accessory muscle usage CARDIOVASCULAR: S1, S2 normal. No murmurs, rubs, or gallops.  ABDOMEN: Soft, nontender, nondistended. Bowel sounds present. No organomegaly or mass.  EXTREMITIES: No pedal edema, cyanosis, or clubbing.  NEUROLOGIC: Cranial nerves II through XII are intact. Muscle strength 5/5 in all extremities. Sensation intact. Gait not checked.  PSYCHIATRIC: The patient is alert and oriented x 3.  SKIN: No obvious rash, lesion, or ulcer.   LABORATORY PANEL:   CBC Recent Labs  Lab 07/25/18 0912 07/29/18 0845  WBC 4.8 3.9*  HGB 8.8* 8.9*  HCT 24.8* 26.1*  PLT 152 131*  MCV 81.6 81.8  MCH 28.9 27.9  MCHC 35.5 34.1  RDW 14.9 15.5  LYMPHSABS 0.7 0.8  MONOABS 0.5 0.5  EOSABS 0.0 0.1  BASOSABS 0.0 0.0   ------------------------------------------------------------------------------------------------------------------  Chemistries  Recent Labs  Lab 07/25/18 0912 07/29/18 0845  NA 129* 129*  K 3.8 3.6  CL 103 105  CO2 15* 17*  GLUCOSE 123* 133*  BUN 16 11  CREATININE 1.10 1.02  CALCIUM 8.5* 8.0*  AST 34 35  ALT 20 22  ALKPHOS 33* 35*  BILITOT 0.5 0.5    ------------------------------------------------------------------------------------------------------------------ estimated creatinine clearance is 52 mL/min (by C-G formula based on SCr of 1.02 mg/dL). ------------------------------------------------------------------------------------------------------------------ No results for input(s): TSH, T4TOTAL, T3FREE, THYROIDAB in the last 72 hours.  Invalid input(s): FREET3   Coagulation profile No results for input(s): INR, PROTIME in the last 168 hours. ------------------------------------------------------------------------------------------------------------------- No results for input(s): DDIMER in the last 72 hours. -------------------------------------------------------------------------------------------------------------------  Cardiac Enzymes No results for input(s): CKMB, TROPONINI, MYOGLOBIN in the last 168 hours.  Invalid input(s): CK ------------------------------------------------------------------------------------------------------------------ Invalid input(s): POCBNP  ---------------------------------------------------------------------------------------------------------------  Urinalysis    Component Value Date/Time   COLORURINE YELLOW (A) 07/22/2018 1129   APPEARANCEUR CLEAR (A) 07/22/2018 1129   LABSPEC 1.013 07/22/2018 1129   PHURINE 5.0 07/22/2018 1129   GLUCOSEU NEGATIVE 07/22/2018 1129   HGBUR NEGATIVE 07/22/2018 1129   BILIRUBINUR NEGATIVE 07/22/2018 1129   KETONESUR NEGATIVE 07/22/2018 1129   PROTEINUR NEGATIVE 07/22/2018 1129   NITRITE NEGATIVE 07/22/2018 1129   LEUKOCYTESUR NEGATIVE 07/22/2018 1129     RADIOLOGY: Ct Angio Chest Pe W Or Wo Contrast  Result Date: 07/29/2018 CLINICAL DATA:  85 year old male with a history of shortness of breath for 2-3 months EXAM: CT ANGIOGRAPHY CHEST WITH CONTRAST TECHNIQUE: Multidetector CT imaging of the chest was performed using the standard protocol during  bolus administration of intravenous contrast. Multiplanar CT image reconstructions and MIPs were obtained to evaluate the vascular anatomy. CONTRAST:  53mL ISOVUE-370 IOPAMIDOL (ISOVUE-370) INJECTION 76% COMPARISON:  07/04/2018 FINDINGS: Cardiovascular: Heart: No cardiomegaly. No pericardial fluid/thickening. Calcifications of left main, left anterior descending coronary arteries. Aorta: Unremarkable course, caliber, contour of the thoracic aorta. No aneurysm or dissection flap. No periaortic fluid. Minimal atherosclerotic changes. Pulmonary arteries: No central, lobar, segmental, or proximal subsegmental filling defects. Unchanged right port catheter. Mediastinum/Nodes: No mediastinal adenopathy. Unremarkable appearance of the thoracic esophagus. Unremarkable appearance of the thoracic inlet and thyroid. Lungs/Pleura: New ground-glass opacity of the left upper lobe with scattered regions of ground-glass opacity of the right upper lobe and bilateral lower lobes. These changes are new from the comparison CT. No pleural effusion. No confluent airspace disease. No pneumothorax. Upper Abdomen: No acute finding of the upper abdomen Musculoskeletal: No acute displaced fracture. Similar pattern multiple sclerotic lesions of the appendicular and axial skeleton, including: Right and left scapula, right clavicle, bilateral  aspect of the manubrium, T1, T2, T3, T4, T5, T6, T8, T9, T10, T11, T12, L1, bilateral ribs. Sclerotic changes within the rib deformities of the left 2-6 favored to represent metastases superimposed on remodeled fracture. Review of the MIP images confirms the above findings. IMPRESSION: CT negative for pulmonary emboli. New multifocal ground-glass opacity throughout the lungs, compatible with multifocal pneumonia. Differential would also include inflammatory changes such as pneumonitis, including toxic/inhalation exposure or hypersensitivity pneumonitis. Coronary artery disease and mild atherosclerosis.  Redemonstration of skeletal metastases. Electronically Signed   By: Corrie Mckusick D.O.   On: 07/29/2018 13:28    EKG: Orders placed or performed during the hospital encounter of 03/25/18  . ED EKG within 10 minutes  . ED EKG within 10 minutes  . EKG    IMPRESSION AND PLAN: Patient is 85 year old presenting with shortness of breath cough  1.  Multifocal pneumonia We will obtain blood culture Check the flu test Treat with IV Levaquin Sputum cultures if possible  2.  Metastatic prostate cancer per oncology continue Decadron  3.  Hypertension continue atenolol, telmisartan and amlodipine  4.  GERD continue PPI    All the records are reviewed and case discussed with ED provider. Management plans discussed with the patient, family and they are in agreement.  CODE STATUS: Limited code   TOTAL TIME TAKING CARE OF THIS PATIENT:65minutes.    Dustin Flock M.D on 07/29/2018 at 4:48 PM  Between 7am to 6pm - Pager - [phone]  After 6pm go to [email or web address] - password [email or web address] [facility]  [phone]  CC: Primary care physician; [place] Pink, MD

## 2018-07-30 LAB — CBC
HCT: 22.7 % — ABNORMAL LOW (ref 39.0–52.0)
Hemoglobin: 7.9 g/dL — ABNORMAL LOW (ref 13.0–17.0)
MCH: 28.5 pg (ref 26.0–34.0)
MCHC: 34.8 g/dL (ref 30.0–36.0)
MCV: 81.9 fL (ref 80.0–100.0)
Platelets: 137 10*3/uL — ABNORMAL LOW (ref 150–400)
RBC: 2.77 MIL/uL — ABNORMAL LOW (ref 4.22–5.81)
RDW: 15.4 % (ref 11.5–15.5)
WBC: 4.6 10*3/uL (ref 4.0–10.5)
nRBC: 0 % (ref 0.0–0.2)

## 2018-07-30 LAB — BASIC METABOLIC PANEL
Anion gap: 7 (ref 5–15)
BUN: 11 mg/dL (ref 8–23)
CO2: 18 mmol/L — ABNORMAL LOW (ref 22–32)
Calcium: 7.7 mg/dL — ABNORMAL LOW (ref 8.9–10.3)
Chloride: 108 mmol/L (ref 98–111)
Creatinine, Ser: 0.85 mg/dL (ref 0.61–1.24)
GFR calc Af Amer: >60 mL/min (ref >60)
GLUCOSE: 89 mg/dL (ref 70–99)
Potassium: 3.8 mmol/L (ref 3.5–5.1)
Sodium: 133 mmol/L — ABNORMAL LOW (ref 135–145)

## 2018-07-30 LAB — PROCALCITONIN: Procalcitonin: 0.46 ng/mL

## 2018-07-30 MED ORDER — AZITHROMYCIN 500 MG PO TABS
500.0000 mg | ORAL_TABLET | Freq: Every day | ORAL | Status: DC
  Administered 2018-07-30 – 2018-07-31 (×2): 500 mg via ORAL
  Filled 2018-07-30 (×2): qty 1

## 2018-07-30 MED ORDER — SODIUM CHLORIDE 0.9 % IV SOLN
1.0000 g | INTRAVENOUS | Status: DC
  Administered 2018-07-30 – 2018-07-31 (×2): 1 g via INTRAVENOUS
  Filled 2018-07-30: qty 10
  Filled 2018-07-30 (×2): qty 1

## 2018-07-30 MED ORDER — LEVOFLOXACIN 750 MG PO TABS: 750.0000 mg | ORAL_TABLET | Freq: Every day | ORAL | Status: DC

## 2018-07-30 NOTE — Consult Note (Signed)
Pharmacy Antibiotic Note  Matthew Hunter is a 85 y.o. male admitted on 07/29/2018 with multifocal pneumonia. Patient admitted from home via the ED. Pharmacy has been consulted for levofloxacin dosing. Patient is influenza negative.  Plan: Continue levofloxacin 750mg  PO Q24hr. Procalcitonin pending. Patient has no allergies. Please consider transitioning patient to azithromycin/ceftriaxone.   Height: 5\' 9"  (175.3 cm) Weight: 165 lb 6.4 oz (75 kg) IBW/kg (Calculated) : 70.7 Temp (24hrs), Avg:98 F (36.7 C), Min:96.1 F (35.6 C), Max:99.7 F (37.6 C)  Recent Labs  Lab 07/25/18 0912 07/29/18 0845 07/30/18 0646  WBC 4.8 3.9* 4.6  CREATININE 1.10 1.02 0.85    Estimated Creatinine Clearance: 62.4 mL/min (by C-G formula based on SCr of 0.85 mg/dL).    No Known Allergies  Antimicrobials this admission: Levofloxacin 2/28 >>   Dose adjustments this admission: None  Microbiology results: 2/28 BCx: no growth < 12 hours  2/28 Sputum: pending 2/28 Influenza PCR: negative  2/29 Procalcitonin: pending   Thank you for allowing pharmacy to be a part of this patient's care.  Simpson,Michael L,  07/30/2018 7:30 AM

## 2018-07-30 NOTE — Progress Notes (Signed)
Hematology/Oncology  Follow up note [facility] [phone] [phone]  Patient Care Team: Maryland Pink, MD as PCP - General (Family Medicine) Earlie Server, MD as Medical Oncologist (Medical Oncology)  REASON FOR VISIT Follow up for antineoplasm treatment of Prostate Cancer  HISTORY OF PRESENTING ILLNESS:  Matthew Hunter 85 y.o.  male with PMH listed as below who was referred by [facility] Urology Provider [name] to me for evaluation of clinical prostate cancer and management.  He has a history of elevated PSA and BPH. PSA trends as follows: 11/14/2014 4.3, 11/07/2015 7.1, 05/11/2016 10.4, 06/11/2016 10.6, 07/07/2016 7.8, 01/06/2017 86.8.  He was seen by urology and was diagnosed with clinical prostate cancer given the extreme elevation of PSA and prostate nodule. Biopsy was not pursued. He was treated with [name] on 01/29/2017,  # prostate biopsy on 03/10/2017. 5 out of 6 cores positive for prostate cancer, with highest gleason score group 9 (4+5), + perineural invasion.  Stage IV prostate cancer, multiple osseus metastatic disease. No visceral involvement.   01/18/2018  CT scan image was independently reviewed by me and discussed with patient.  Patient has progression of bone metastatic disease only  Current treatment  Patient got first [place] on 01/29/2018, switched to leupron on 04/13/2015,  Q6 months.  .Testosterone level at 14, castration level.  Upfront Gillermina Phy was added on December 2019 to assist fast disease control (phase II trial [place] B, [facility] Oncol. 2014) S/p palliative RT to lumber spine and SI joints. Xtandi discontinued on due to CNS toxicity in April 2019.  12/09/17 Abiaterone discontinued.  7/ 18/2019 Start Apalutimide 240mg  daily.   Trend of tumor marker  PSA 86.8 -->64.78 -->131--> 242 added Xtanti --> 17.74-->1.82 Xtandi stopped due to CNS toxicity in April 2019. --> 1.79- (added Abiaterone)-> 11.06-->24.53--> 39.48  [Abiaterone stopped, Added Apalutimid]--> 65.71-->88.63--> Xofigo treatment--> 215--> Back on Xtandi reduced dose.   12/09/17 Abiaterone discontinued due to rising PSA and disease progression. 12/16/2017 Start Apalutimide 240mg  daily. Held on 01/13/2018 due to rising PSA, also causing patient to feel dizziness/black out.  Started Xofigo treatment on 02/23/2018. 02/28/2018, back on Xtandi reduced dose 120mg . Xtandi stopped Dec.2020 due to   05/20/2018 Weekly Docetaxel 2 weeks on one week off.  Finish 6 cycles of Xofigo treatments on 07/13/2018. 1 unit of irradiated PRBC transfusion on 07/08/2018    INTERVAL HISTORY Patient presents to follow up for management of metastatic prostate cancer and follow-up for evaluation prior to chemotherapy treatment today. He reports feeling weak, some chills, no fever.  Shortness of breath since last week, CXR 07/22/2018 showed stable left base atelectasis/scarring. No infiltrates.  Nasal congestion. No sore throat. Influenza screening was negative last week.  Denies any fever, nausea vomiting, abdominal pain, diarrhea.  No recent travel history. He states" I mostly stay at home and only comes to cancer center for treatment and see doctors".  Denies sick contacts.  Appetite is very poor,   Review of Systems  Constitutional: Positive for appetite change and fatigue. Negative for chills, fever and unexpected weight change.  HENT:   Negative for hearing loss and voice change.        Nasal congestion  Eyes: Negative for eye problems and icterus.  Respiratory: Positive for shortness of breath. Negative for chest tightness and cough.   Cardiovascular: Negative for chest pain and leg swelling.  Gastrointestinal: Negative for abdominal distention, abdominal pain and diarrhea.  Endocrine: Negative for hot flashes.  Genitourinary: Negative for difficulty urinating, dysuria and frequency.  Musculoskeletal: Negative for arthralgias and back pain.  Skin: Negative for  itching and rash.  Neurological: Negative for light-headedness and numbness.  Hematological: Negative for adenopathy. Does not bruise/bleed easily.  Psychiatric/Behavioral: Negative for confusion.    MEDICAL HISTORY:  Past Medical History:  Diagnosis Date  . Arthritis   . Benign enlargement of prostate   . CAD (coronary artery disease)   . Constipation   . Elevated PSA   . Family history of breast cancer   . Family history of prostate cancer   . GERD (gastroesophageal reflux disease)   . Hypertension   . Incomplete bladder emptying   . Nocturia   . Prostate cancer ([place]) 05/03/2017   Rad tx's and Lupron shots.   . Urgency of micturation   . Urinary frequency     SURGICAL HISTORY: Past Surgical History:  Procedure Laterality Date  . GALLBLADDER SURGERY    . PORTA CATH INSERTION N/A 06/07/2018   Procedure: PORTA CATH INSERTION;  Surgeon: Katha Cabal, MD;  Location: [place] [facility];  Service: Cardiovascular;  Laterality: N/A;  . PROSTATE SURGERY    . STOMACH SURGERY      SOCIAL HISTORY: Social History   Socioeconomic History  . Marital status: Married    Spouse name: Not on file  . Number of children: Not on file  . Years of education: Not on file  . Highest education level: Not on file  Occupational History  . Not on file  Social Needs  . Financial resource strain: Not on file  . Food insecurity:    Worry: Not on file    Inability: Not on file  . Transportation needs:    Medical: Not on file    Non-medical: Not on file  Tobacco Use  . Smoking status: Former Smoker    Last attempt to quit: 06/01/1998    Years since quitting: 20.1  . Smokeless tobacco: Never Used  Substance and Sexual Activity  . Alcohol use: No    Alcohol/week: 0.0 standard drinks  . Drug use: Not Currently    Frequency: 2.0 times per week    Types: Marijuana    Comment: marijuana two time monthly gives him an appetite  . Sexual activity: Not on file  Lifestyle  . Physical  activity:    Days per week: Not on file    Minutes per session: Not on file  . Stress: Not on file  Relationships  . Social connections:    Talks on phone: Not on file    Gets together: Not on file    Attends religious service: Not on file    Active member of club or organization: Not on file    Attends meetings of clubs or organizations: Not on file    Relationship status: Not on file  . Intimate partner violence:    Fear of current or ex partner: Not on file    Emotionally abused: Not on file    Physically abused: Not on file    Forced sexual activity: Not on file  Other Topics Concern  . Not on file  Social History Narrative  . Not on file    FAMILY HISTORY: Family History  Problem Relation Age of Onset  . Hypertension Mother   . Stroke Father   . Breast cancer Niece        dx  under 78  . Breast cancer Daughter 73  . Prostate cancer Son 34  . Kidney disease Neg Hx   .  Kidney cancer Neg Hx   . Bladder Cancer Neg Hx     ALLERGIES:  has No Known Allergies.  MEDICATIONS:  No current facility-administered medications for this visit.    No current outpatient medications on file.   Facility-Administered Medications Ordered in Other Visits  Medication Dose Route Frequency Provider Last Rate Last Dose  . 0.9 %  sodium chloride infusion   Intravenous Continuous Dustin Flock, MD   Stopped at 07/30/18 1617  . acetaminophen (TYLENOL) tablet 650 mg  650 mg Oral Q6H PRN Dustin Flock, MD       Or  . acetaminophen (TYLENOL) suppository 650 mg  650 mg Rectal Q6H PRN Dustin Flock, MD      . amLODipine (NORVASC) tablet 5 mg  5 mg Oral Daily Lu Duffel, [facility]      . aspirin EC tablet 81 mg  81 mg Oral Daily Dustin Flock, MD   81 mg at 07/30/18 8413  . azithromycin (ZITHROMAX) tablet 500 mg  500 mg Oral q1800 Vaughan Basta, MD   500 mg at 07/30/18 1615  . calcium-vitamin D (OSCAL WITH D) 500-200 MG-UNIT per tablet 1 tablet  1 tablet Oral Daily Dustin Flock, MD   1 tablet at 07/30/18 [phone]  . cefTRIAXone (ROCEPHIN) 1 g in sodium chloride 0.9 % 100 mL IVPB  1 g Intravenous Q24H Vaughan Basta, MD   Stopped at 07/30/18 1619  . dexamethasone (DECADRON) tablet 4 mg  4 mg Oral Daily Dustin Flock, MD   4 mg at 07/30/18 1027  . enoxaparin (LOVENOX) injection 40 mg  40 mg Subcutaneous Q24H Dustin Flock, MD   40 mg at 07/29/18 2130  . famotidine (PEPCID) tablet 20 mg  20 mg Oral Daily Dustin Flock, MD   20 mg at 07/30/18 0940  . fentaNYL (DURAGESIC) 50 MCG/HR 10 patch  10 patch Transdermal Q72H Dustin Flock, MD      . finasteride (PROSCAR) tablet 5 mg  5 mg Oral Daily Dustin Flock, MD   5 mg at 07/30/18 2536  . irbesartan (AVAPRO) tablet 300 mg  300 mg Oral Daily Lu Duffel, RPH   300 mg at 07/30/18 6440  . megestrol (MEGACE) tablet 80 mg  80 mg Oral BID Dustin Flock, MD   80 mg at 07/30/18 3474  . mirabegron ER (MYRBETRIQ) tablet 25 mg  25 mg Oral Daily Dustin Flock, MD   25 mg at 07/30/18 0939  . mirtazapine (REMERON) tablet 15 mg  15 mg Oral QHS Dustin Flock, MD   15 mg at 07/29/18 2129  . ondansetron (ZOFRAN) tablet 4 mg  4 mg Oral Q6H PRN Dustin Flock, MD       Or  . ondansetron (ZOFRAN) injection 4 mg  4 mg Intravenous Q6H PRN Dustin Flock, MD      . ondansetron [facility]) tablet 8 mg  8 mg Oral BID PRN Dustin Flock, MD      . oxyCODONE (Oxy IR/ROXICODONE) immediate release tablet 10 mg  10 mg Oral Q6H PRN Dustin Flock, MD      . pantoprazole (PROTONIX) EC tablet 40 mg  40 mg Oral Daily Dustin Flock, MD   40 mg at 07/30/18 2595  . polyethylene glycol (MIRALAX / GLYCOLAX) packet 17 g  17 g Oral Daily Dustin Flock, MD   17 g at 07/30/18 0939  . prochlorperazine (COMPAZINE) tablet 10 mg  10 mg Oral Q6H PRN Dustin Flock, MD      . pyridOXINE (VITAMIN B-6)  tablet 50 mg  50 mg Oral Daily Dustin Flock, MD   50 mg at 07/30/18 7846  . senna-docusate (Senokot-S) tablet 2 tablet  2 tablet  Oral Daily Dustin Flock, MD   2 tablet at 07/30/18 [phone]  . sodium chloride flush (NS) 0.9 % injection 10 mL  10 mL Intravenous PRN Earlie Server, MD   10 mL at 07/15/18 0847  . tamsulosin (FLOMAX) capsule 0.4 mg  0.4 mg Oral Daily Dustin Flock, MD   0.4 mg at 07/30/18 5284    PHYSICAL EXAMINATION: ECOG PERFORMANCE STATUS: 1 - Symptomatic but completely ambulatory Vitals:   07/29/18 0905  BP: (!) 96/56  Pulse: 89  Resp: 18  Temp: (!) 96.1 F (35.6 C)   Filed Weights   07/29/18 0905  Weight: 163 lb (73.9 kg)   Physical Exam Constitutional:      General: He is not in acute distress.    Appearance: He is ill-appearing. He is not diaphoretic.  HENT:     Head: Normocephalic and atraumatic.     Mouth/Throat:     Pharynx: No oropharyngeal exudate.  Eyes:     General: No scleral icterus.    Conjunctiva/sclera: Conjunctivae normal.     Pupils: Pupils are equal, round, and reactive to light.  Neck:     Musculoskeletal: Normal range of motion and neck supple.     Thyroid: No thyromegaly.  Cardiovascular:     Rate and Rhythm: Normal rate and regular rhythm.     Heart sounds: Normal heart sounds. No murmur.  Pulmonary:     Effort: Pulmonary effort is normal. No respiratory distress.     Breath sounds: No wheezing.  Abdominal:     General: Bowel sounds are normal. There is no distension.     Palpations: Abdomen is soft. There is no mass.     Tenderness: There is no abdominal tenderness.  Musculoskeletal: Normal range of motion.        General: No deformity.  Skin:    General: Skin is warm and dry.     Coloration: Skin is pale.     Findings: No erythema or rash.  Neurological:     Mental Status: He is alert and oriented to person, place, and time.     Cranial Nerves: No cranial nerve deficit.     Coordination: Coordination normal.  Psychiatric:        Behavior: Behavior normal.        Thought Content: Thought content normal.    LABORATORY DATA:  I have reviewed the data  as listed Lab Results  Component Value Date   WBC 4.6 07/30/2018   HGB 7.9 (L) 07/30/2018   HCT 22.7 (L) 07/30/2018   MCV 81.9 07/30/2018   PLT 137 (L) 07/30/2018   Recent Labs    07/22/18 1013 07/25/18 0912 07/29/18 0845 07/30/18 0646  NA 129* 129* 129* 133*  K 4.1 3.8 3.6 3.8  CL 104 103 105 108  CO2 15* 15* 17* 18*  GLUCOSE 107* 123* 133* 89  BUN 17 16 11 11   CREATININE 1.11 1.10 1.02 0.85  CALCIUM 8.8* 8.5* 8.0* 7.7*  GFRNONAA 60* >60 >60 >60  GFRAA >60 >60 >60 >60  PROT 6.6 6.7 6.4*  --   ALBUMIN 3.3* 3.2* 2.9*  --   AST 31 34 35  --   ALT 20 20 22   --   ALKPHOS 38 33* 35*  --   BILITOT 0.6 0.5 0.5  --  RADIOGRAPHIC STUDIES: I have personally reviewed the radiological images as listed and agreed with the findings in the report. 12/15/2017 Bone scan whole body showed new sites of osseous tracer accumulation are identified especially to size at the distal left femoral diaphysis, L2 vertebral body, right iliac bone, right ischium. 01/18/2018 chest abdomen pelvis with contrast Extensive metastasis disease to bone, with all bone lesions new from the abdomen and pelvis CT dated 9/5 2018 and a chest CT dated 06/19/2013.  Involvement is similar to the recent bone scan.  No other evidence of metastatic disease.  No acute abnormalities in the chest abdomen and pelvis.  03/14/2018 CT chest Angio PE protocol 1. No acute cardiopulmonary abnormalities. No evidence for acute pulmonary embolus 2. Coronary artery atherosclerotic calcifications 3.  Aortic Atherosclerosis (ICD10-I70.0). 4. Sclerotic bone metastasis as noted previously  03/25/2018 CT abdomen pelvis with contrast 1. No acute abnormality in the abdomen or pelvis. 2. Extensive metastatic bone disease. Findings are similar to theexam on 01/18/2018.  3. Nonobstructive left kidney stone. 4. Stable left adrenal nodule.  ASSESSMENT & PLAN: 85 y.o. male follows up for management of metastatic prostate cancer and neoplasm  related symptoms..  1. Nasal congestion   2. Shortness of breath   3. Prostate cancer ([place])   4. Bone metastasis ([place])   5. 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 ADT through urology office.  PSA has decreased to 163 and now slightly trended up. .  Finished 6 cycles of Xofigo treatment.   Continue to hold chemotherapy due to fatigue and weakness.   # Hyponatremia, likely dehyrdration.  Will give 1 liter of IV normal saline.   # SOB/weakness  Influenza negative, CXR negative last week.  Obtain Stat CT chest angiogram to rule out PE.  CT was independantly reviewed. Negative for PE, positive for multifocal pneumonia. Respiratory panel pending.  Given her advanced age, immunocompromised state, recommend inpatient management.  Called oncall pathologist Dr.Patel and discussed about his case.  Patient will be directly admitted for pneumonia treatment.   We spent sufficient time to discuss many aspect of care, questions were answered to patient's satisfaction. Total face to face encounter time for this patient visit was 25 min. >50% of the time was  spent in counseling and coordination of care.   Earlie Server, MD, PhD Hematology Oncology [facility] at [facility] Pager- [phone]

## 2018-07-31 DIAGNOSIS — E43 Unspecified severe protein-calorie malnutrition: Secondary | ICD-10-CM

## 2018-07-31 MED ORDER — ENSURE ENLIVE PO LIQD
237.0000 mL | Freq: Three times a day (TID) | ORAL | Status: DC
  Administered 2018-07-31 – 2018-08-01 (×2): 237 mL via ORAL

## 2018-07-31 MED ORDER — ADULT MULTIVITAMIN W/MINERALS CH
1.0000 | ORAL_TABLET | Freq: Every day | ORAL | Status: DC
  Administered 2018-08-01: 10:00:00 1 via ORAL
  Filled 2018-07-31: qty 1

## 2018-07-31 NOTE — Progress Notes (Signed)
Initial Nutrition Assessment  DOCUMENTATION CODES:   Severe malnutrition in context of chronic illness  INTERVENTION:  Recommend liberalizing diet to regular.  Provide Ensure Enlive po TID, each supplement provides 350 kcal and 20 grams of protein. Patient prefers chocolate.  Provide daily MVI.  Encouraged adequate intake of calories and protein from meals, snacks, and beverages.  NUTRITION DIAGNOSIS:   Severe Malnutrition related to chronic illness(stage IV prostate cancer) as evidenced by severe fat depletion, severe muscle depletion, 16.2% weight loss over 8 months.  GOAL:   Patient will meet greater than or equal to 90% of their needs  MONITOR:   PO intake, Supplement acceptance, Labs, Weight trends, I & O's  REASON FOR ASSESSMENT:   Malnutrition Screening Tool    ASSESSMENT:   85 year old male with PMHx of HTN, GERD, arthritis, CAD, stage IV prostate cancer (further chemo on hold due to fatigue and weakness) now admitted with multifocal PNA.   Met with patient at bedside. Patient reports a decreased appetite at baseline that has been worse now the past 2-3 weeks due to generally not feeling well. He endorses anorexia and early satiety. He tries to eat 2 meals per day but reports only eating "parts" of the meals. He is unable to describe his usual intake any further than that. Only reports he can tolerate "almost any food" the problem is he cannot eat much. He enjoys Ensure Enlive and drinks about 2 bottles per day usually.   Patient reports today that his UBW was 176 lbs, but there are weights higher in chart. Per review of chart patient was 190 lbs on 12/16/2017. RD obtained bed scale weight today of 72.2 kg (159.17 lbs). He has lost 30.8 lbs (16.2% body weight) over 8 months, which is significant for time frame.  Meal Completion: 30-50%  Medications reviewed and include: azithromycin, Oscal with D 1 tablet daily, Decadron 4 mg daily, famotidine, fentanyl patch, Megace  80 mg BID, Remeron 15 mg QHS, pantoprazole, Miralax, senna-docusate, Flomax, NS @ 75 mL/hr, ceftriaxone.  Labs reviewed: Sodium 133, CO2 18.  NUTRITION - FOCUSED PHYSICAL EXAM:    Most Recent Value  Orbital Region  Severe depletion  Upper Arm Region  Severe depletion  Thoracic and Lumbar Region  Severe depletion  Buccal Region  Severe depletion  Temple Region  Severe depletion  Clavicle Bone Region  Severe depletion  Clavicle and Acromion Bone Region  Severe depletion  Scapular Bone Region  Severe depletion  Dorsal Hand  Moderate depletion  Patellar Region  Severe depletion  Anterior Thigh Region  Severe depletion  Posterior Calf Region  Moderate depletion  Edema (RD Assessment)  None  Hair  Reviewed  Eyes  Reviewed  Mouth  Reviewed  Skin  Reviewed  Nails  Reviewed     Diet Order:   Diet Order            Diet Heart Room service appropriate? Yes; Fluid consistency: Thin  Diet effective now             EDUCATION NEEDS:   Education needs have been addressed  Skin:  Skin Assessment: Reviewed RN Assessment  Last BM:  07/30/2018 per chart  Height:   Ht Readings from Last 1 Encounters:  07/29/18 '5\' 9"'$  (1.753 m)   Weight:   Wt Readings from Last 1 Encounters:  07/31/18 72.2 kg   Ideal Body Weight:  72.7 kg  BMI:  Body mass index is 23.51 kg/m.  Estimated Nutritional Needs:  Kcal:  1820-2100 (MSJ x 1.3-1.5)  Protein:  95-110 grams (1.3-1.5 grams/kg)  Fluid:  1.8-2.1 L/day (1 mL/kcal)  Willey Blade, MS, RD, LDN Office: [phone] Pager: [phone] After Hours/Weekend Pager: [phone]

## 2018-07-31 NOTE — Progress Notes (Signed)
[place] at [place] NAME: Matthew Hunter    MR#:  [id number]  DATE OF BIRTH:  18-Dec-1929  SUBJECTIVE:  CHIEF COMPLAINT:  No chief complaint on file. Still have cough and some shortness of breath.  REVIEW OF SYSTEMS:  CONSTITUTIONAL: No fever, fatigue or weakness.  EYES: No blurred or double vision.  EARS, NOSE, AND THROAT: No tinnitus or ear pain.  RESPIRATORY: Have cough, shortness of breath, no wheezing or hemoptysis.  CARDIOVASCULAR: No chest pain, orthopnea, edema.  GASTROINTESTINAL: No nausea, vomiting, diarrhea or abdominal pain.  GENITOURINARY: No dysuria, hematuria.  ENDOCRINE: No polyuria, nocturia,  HEMATOLOGY: No anemia, easy bruising or bleeding SKIN: No rash or lesion. MUSCULOSKELETAL: No joint pain or arthritis.   NEUROLOGIC: No tingling, numbness, weakness.  PSYCHIATRY: No anxiety or depression.   ROS  DRUG ALLERGIES:  No Known Allergies  VITALS:  Blood pressure (!) 96/52, pulse 95, temperature 98.4 F (36.9 C), resp. rate 14, height 5\' 9"  (1.753 m), weight 72.2 kg, SpO2 94 %.  PHYSICAL EXAMINATION:  GENERAL:  85 y.o.-year-old patient lying in the bed with no acute distress.  EYES: Pupils equal, round, reactive to light and accommodation. No scleral icterus. Extraocular muscles intact.  HEENT: Head atraumatic, normocephalic. Oropharynx and nasopharynx clear.  NECK:  Supple, no jugular venous distention. No thyroid enlargement, no tenderness.  LUNGS: Normal breath sounds bilaterally, no wheezing, some crepitation. No use of accessory muscles of respiration.  CARDIOVASCULAR: S1, S2 normal. No murmurs, rubs, or gallops.  ABDOMEN: Soft, nontender, nondistended. Bowel sounds present. No organomegaly or mass.  EXTREMITIES: No pedal edema, cyanosis, or clubbing.  NEUROLOGIC: Cranial nerves II through XII are intact. Muscle strength 4/5 in all extremities. Sensation intact. Gait not checked.  PSYCHIATRIC: The patient is  alert and oriented x 3.  SKIN: No obvious rash, lesion, or ulcer.   Physical Exam LABORATORY PANEL:   CBC Recent Labs  Lab 07/30/18 0646  WBC 4.6  HGB 7.9*  HCT 22.7*  PLT 137*   ------------------------------------------------------------------------------------------------------------------  Chemistries  Recent Labs  Lab 07/29/18 0845 07/30/18 0646  NA 129* 133*  K 3.6 3.8  CL 105 108  CO2 17* 18*  GLUCOSE 133* 89  BUN 11 11  CREATININE 1.02 0.85  CALCIUM 8.0* 7.7*  AST 35  --   ALT 22  --   ALKPHOS 35*  --   BILITOT 0.5  --    ------------------------------------------------------------------------------------------------------------------  Cardiac Enzymes No results for input(s): TROPONINI in the last 168 hours. ------------------------------------------------------------------------------------------------------------------  RADIOLOGY:  No results found.  ASSESSMENT AND PLAN:   Active Problems:   PNA (pneumonia)   Protein-calorie malnutrition, severe  1.  Multifocal pneumonia Sent blood culture Negative the flu test Levaquin changed to Rocephin and azithromycin. Sputum cultures if possible  2.  Metastatic prostate cancer per oncology continue Decadron  3.  Hypertension continue atenolol, telmisartan and amlodipine  4.  GERD continue PPI  5.  Protein calorie malnutrition due to cancer-advised on diet.  Advised him to ambulate in the room.  All the records are reviewed and case discussed with Care Management/Social Workerr. Management plans discussed with the patient, family and they are in agreement.  CODE STATUS: Partial  TOTAL TIME TAKING CARE OF THIS PATIENT: 35 minutes.     POSSIBLE D/C IN 1-2 DAYS, DEPENDING ON CLINICAL CONDITION.   Vaughan Basta M.D on 07/31/2018   Between 7am to 6pm - Pager - [phone]  After 6pm go to [email or web address] -  password [email or web address] [facility]  Office   [phone]  CC: Primary care physician; Maryland Pink, MD  Note: This dictation was prepared with Dragon dictation along with smaller phrase technology. Any transcriptional errors that result from this process are unintentional.

## 2018-07-31 NOTE — Progress Notes (Signed)
[place] [place] NAME: Matthew Hunter    MR#:  [id number]  DATE OF BIRTH:  1929/12/05  SUBJECTIVE:  CHIEF COMPLAINT:  No chief complaint on file. Still have cough and some shortness of breath.  Slightly better than yesterday but not fully back to his baseline.  REVIEW OF SYSTEMS:  CONSTITUTIONAL: No fever, fatigue or weakness.  EYES: No blurred or double vision.  EARS, NOSE, AND THROAT: No tinnitus or ear pain.  RESPIRATORY: Have cough, shortness of breath, no wheezing or hemoptysis.  CARDIOVASCULAR: No chest pain, orthopnea, edema.  GASTROINTESTINAL: No nausea, vomiting, diarrhea or abdominal pain.  GENITOURINARY: No dysuria, hematuria.  ENDOCRINE: No polyuria, nocturia,  HEMATOLOGY: No anemia, easy bruising or bleeding SKIN: No rash or lesion. MUSCULOSKELETAL: No joint pain or arthritis.   NEUROLOGIC: No tingling, numbness, weakness.  PSYCHIATRY: No anxiety or depression.   ROS  DRUG ALLERGIES:  No Known Allergies  VITALS:  Blood pressure (!) 96/52, pulse 95, temperature 98.4 F (36.9 C), resp. rate 14, height 5\' 9"  (1.753 m), weight 72.2 kg, SpO2 94 %.  PHYSICAL EXAMINATION:  GENERAL:  85 y.o.-year-old patient lying in the bed with no acute distress.  EYES: Pupils equal, round, reactive to light and accommodation. No scleral icterus. Extraocular muscles intact.  HEENT: Head atraumatic, normocephalic. Oropharynx and nasopharynx clear.  NECK:  Supple, no jugular venous distention. No thyroid enlargement, no tenderness.  LUNGS: Normal breath sounds bilaterally, no wheezing, some crepitation. No use of accessory muscles of respiration.  CARDIOVASCULAR: S1, S2 normal. No murmurs, rubs, or gallops.  ABDOMEN: Soft, nontender, nondistended. Bowel sounds present. No organomegaly or mass.  EXTREMITIES: No pedal edema, cyanosis, or clubbing.  NEUROLOGIC: Cranial nerves II through XII are intact. Muscle strength 4/5 in all extremities.  Sensation intact. Gait not checked.  PSYCHIATRIC: The patient is alert and oriented x 3.  SKIN: No obvious rash, lesion, or ulcer.   Physical Exam LABORATORY PANEL:   CBC Recent Labs  Lab 07/30/18 0646  WBC 4.6  HGB 7.9*  HCT 22.7*  PLT 137*   ------------------------------------------------------------------------------------------------------------------  Chemistries  Recent Labs  Lab 07/29/18 0845 07/30/18 0646  NA 129* 133*  K 3.6 3.8  CL 105 108  CO2 17* 18*  GLUCOSE 133* 89  BUN 11 11  CREATININE 1.02 0.85  CALCIUM 8.0* 7.7*  AST 35  --   ALT 22  --   ALKPHOS 35*  --   BILITOT 0.5  --    ------------------------------------------------------------------------------------------------------------------  Cardiac Enzymes No results for input(s): TROPONINI in the last 168 hours. ------------------------------------------------------------------------------------------------------------------  RADIOLOGY:  No results found.  ASSESSMENT AND PLAN:   Active Problems:   PNA (pneumonia)   Protein-calorie malnutrition, severe  1.  Multifocal pneumonia Sent blood culture Negative the flu test Levaquin changed to Rocephin and azithromycin. Sputum cultures if possible  2.  Metastatic prostate cancer per oncology continue Decadron  3.  Hypertension stopped medication as patient had some episodes of borderline low blood pressures since last night.  4.  GERD continue PPI  5.  Protein calorie malnutrition due to cancer-advised on diet.  Advised him to ambulate in the room.  All the records are reviewed and case discussed with Care Management/Social Workerr. Management plans discussed with the patient, family and they are in agreement.  CODE STATUS: Partial  TOTAL TIME TAKING CARE OF THIS PATIENT: 35 minutes.     POSSIBLE D/C IN 1-2 DAYS, DEPENDING ON CLINICAL CONDITION.  Vaughan Basta M.D on 07/31/2018   Between 7am to 6pm - Pager -  [phone]  After 6pm go to [email or web address] - password [email or web address] [facility]  Office  [phone]  CC: Primary care physician; [place] Pink, MD  Note: This dictation was prepared with Dragon dictation along with smaller phrase technology. Any transcriptional errors that result from this process are unintentional.

## 2018-08-01 ENCOUNTER — Inpatient Hospital Stay: Payer: Medicare Other

## 2018-08-01 MED ORDER — AZITHROMYCIN 500 MG PO TABS: 500.0000 mg | ORAL_TABLET | Freq: Every day | ORAL | 0 refills | Status: AC

## 2018-08-01 MED ORDER — CEFUROXIME AXETIL 250 MG PO TABS: 250.0000 mg | ORAL_TABLET | Freq: Two times a day (BID) | ORAL | 0 refills | Status: AC

## 2018-08-01 MED ORDER — ADULT MULTIVITAMIN W/MINERALS CH: 1.0000 | ORAL_TABLET | Freq: Every day | ORAL | 0 refills | Status: AC

## 2018-08-01 MED ORDER — HEPARIN SOD (PORK) LOCK FLUSH 100 UNIT/ML IV SOLN
500.0000 [IU] | Freq: Once | INTRAVENOUS | Status: AC
  Administered 2018-08-01: 11:00:00 500 [IU] via INTRAVENOUS
  Filled 2018-08-01: qty 5

## 2018-08-01 MED ORDER — SODIUM CHLORIDE 0.9% FLUSH
10.0000 mL | INTRAVENOUS | Status: AC | PRN
  Administered 2018-08-01: 11:00:00 10 mL

## 2018-08-01 MED ORDER — HEPARIN SOD (PORK) LOCK FLUSH 100 UNIT/ML IV SOLN: 500.0000 [IU] | INTRAVENOUS | Status: DC | PRN

## 2018-08-01 NOTE — Care Management Important Message (Signed)
Important Message  Patient Details  Name: Matthew Hunter MRN: [id number] Date of Birth: [birth year]   Medicare Important Message Given:  Yes    Juliann Pulse A Verlie Liotta 08/01/2018, 10:31 AM

## 2018-08-01 NOTE — Discharge Summary (Signed)
[facility] [place] NAME: Matthew Hunter    MR#:  [id number]  DATE OF BIRTH:  [birth year]  DATE OF ADMISSION:  07/29/2018 ADMITTING PHYSICIAN: Dustin Flock, MD  DATE OF DISCHARGE: 08/01/2018 11:59 AM  PRIMARY CARE PHYSICIAN: [place] Pink, MD    ADMISSION DIAGNOSIS:  multie focal pneumonia  DISCHARGE DIAGNOSIS:  Active Problems:   PNA (pneumonia)   Protein-calorie malnutrition, severe   SECONDARY DIAGNOSIS:   Past Medical History:  Diagnosis Date  . Arthritis   . Benign enlargement of prostate   . CAD (coronary artery disease)   . Constipation   . Elevated PSA   . Family history of breast cancer   . Family history of prostate cancer   . GERD (gastroesophageal reflux disease)   . Hypertension   . Incomplete bladder emptying   . Nocturia   . Prostate cancer ([place]) 05/03/2017   Rad tx's and Lupron shots.   . Urgency of micturation   . Urinary frequency     HOSPITAL COURSE:   1.Multifocal pneumonia Sent blood culture Negative the flu test Levaquin changed to Rocephin and azithromycin. Improved.  2.Metastatic prostate cancer per oncology continue Decadron  3.Hypertension stopped medication as patient had some episodes of borderline low blood pressures since last night.  4.GERD continue PPI  5.  Protein calorie malnutrition due to cancer-advised on diet.   DISCHARGE CONDITIONS:   Stable.  CONSULTS OBTAINED:    DRUG ALLERGIES:  No Known Allergies  DISCHARGE MEDICATIONS:   Allergies as of 08/01/2018   No Known Allergies     Medication List    STOP taking these medications   Telmisartan-amLODIPine 80-5 MG Tabs     TAKE these medications   aspirin EC 81 MG tablet Take 81 mg by mouth daily.   calcium-vitamin D 500-200 MG-UNIT tablet Take 1 tablet by mouth daily.   cefUROXime 250 MG tablet Commonly known as:  Ceftin Take 1 tablet (250 mg total) by mouth 2 (two) times daily for 3  days.   dexamethasone 4 MG tablet Commonly known as:  DECADRON Take 1 tablet (4 mg total) by mouth daily.   famotidine 20 MG tablet Commonly known as:  PEPCID Take 1 tablet (20 mg total) by mouth daily.   finasteride 5 MG tablet Commonly known as:  PROSCAR Take 1 tablet (5 mg total) by mouth daily.   hydrocortisone 2.5 % cream APP EXT AA BID   lidocaine-prilocaine cream Commonly known as:  EMLA Apply 1 application topically as needed.   mirabegron ER 25 MG Tb24 tablet Commonly known as:  Myrbetriq Take 1 tablet (25 mg total) by mouth daily.   mirtazapine 15 MG tablet Commonly known as:  REMERON Take 15 mg by mouth at bedtime.   multivitamin with minerals Tabs tablet Take 1 tablet by mouth daily.   nortriptyline 25 MG capsule Commonly known as:  PAMELOR Take 25 mg by mouth at bedtime. Reported on 05/16/2015   nystatin 100000 UNIT/ML suspension Commonly known as:  MYCOSTATIN Take 5 mLs (500,000 Units total) by mouth 3 (three) times daily.   omeprazole 20 MG capsule Commonly known as:  PRILOSEC TAKE 1 CAPSULE(20 MG) BY MOUTH DAILY   ondansetron 8 MG tablet Commonly known as:  Zofran Take 1 tablet (8 mg total) by mouth 2 (two) times daily as needed for refractory nausea / vomiting.   Oxycodone HCl 10 MG Tabs Take 1 tablet (10 mg total) by mouth every 6 (six)  hours as needed.   polyethylene glycol packet Commonly known as:  MIRALAX / GLYCOLAX Take 17 g by mouth daily. Mix one tablespoon with 8oz of your favorite juice or water every day until you are having soft formed stools. Then start taking once daily if you didn't have a stool the day before.   prochlorperazine 10 MG tablet Commonly known as:  COMPAZINE Take 1 tablet (10 mg total) by mouth every 6 (six) hours as needed (Nausea or vomiting).   pyridOXINE 50 MG tablet Commonly known as:  VITAMIN B-6 Take 50 mg by mouth daily.   senna-docusate 8.6-50 MG tablet Commonly known as:  Senokot-S Take 2 tablets  by mouth daily.   tamsulosin 0.4 MG Caps capsule Commonly known as:  FLOMAX Take 1 capsule (0.4 mg total) by mouth daily.   triamcinolone cream 0.1 % Commonly known as:  KENALOG APP EXT AA BID     ASK your doctor about these medications   azithromycin 500 MG tablet Commonly known as:  ZITHROMAX Take 1 tablet (500 mg total) by mouth daily at 6 PM for 2 days. Ask about: Should I take this medication?        DISCHARGE INSTRUCTIONS:    Follow with PMD in 1-2 weeks.  If you experience worsening of your admission symptoms, develop shortness of breath, life threatening emergency, suicidal or homicidal thoughts you must seek medical attention immediately by calling 911 or calling your MD immediately  if symptoms less severe.  You Must read complete instructions/literature along with all the possible adverse reactions/side effects for all the Medicines you take and that have been prescribed to you. Take any new Medicines after you have completely understood and accept all the possible adverse reactions/side effects.   Please note  You were cared for by a hospitalist during your hospital stay. If you have any questions about your discharge medications or the care you received while you were in the hospital after you are discharged, you can call the unit and asked to speak with the hospitalist on call if the hospitalist that took care of you is not available. Once you are discharged, your primary care physician will handle any further medical issues. Please note that NO REFILLS for any discharge medications will be authorized once you are discharged, as it is imperative that you return to your primary care physician (or establish a relationship with a primary care physician if you do not have one) for your aftercare needs so that they can reassess your need for medications and monitor your lab values.    Today   CHIEF COMPLAINT:  No chief complaint on file.   HISTORY OF PRESENT  ILLNESS:  Matthew Hunter  is a 85 y.o. male with a known history of osteoarthritis, prostate cancer with mets, GERD, hypertension who is presenting to the hospital with complaint of generalized weakness shortness of breath and cough.  Patient states that he has had progressive weakness over the past few months.  The cough and shortness of breath has recently gotten worse.  Patient was seen by his oncologist and underwent a CT scan which showed a multifocal pneumonia therefore history referred for admission.  Patient complains of chills not sure about fever.  Denies any nausea vomiting or diarrhea.   VITAL SIGNS:  Blood pressure 104/61, pulse 98, temperature 98 F (36.7 C), temperature source Oral, resp. rate 17, height 5\' 9"  (1.753 m), weight 72.2 kg, SpO2 94 %.  I/O:  No intake or output data  in the 24 hours ending 08/04/18 1730  PHYSICAL EXAMINATION:   GENERAL:  85 y.o.-year-old patient lying in the bed with no acute distress.  EYES: Pupils equal, round, reactive to light and accommodation. No scleral icterus. Extraocular muscles intact.  HEENT: Head atraumatic, normocephalic. Oropharynx and nasopharynx clear.  NECK:  Supple, no jugular venous distention. No thyroid enlargement, no tenderness.  LUNGS: Normal breath sounds bilaterally, no wheezing, some crepitation. No use of accessory muscles of respiration.  CARDIOVASCULAR: S1, S2 normal. No murmurs, rubs, or gallops.  ABDOMEN: Soft, nontender, nondistended. Bowel sounds present. No organomegaly or mass.  EXTREMITIES: No pedal edema, cyanosis, or clubbing.  NEUROLOGIC: Cranial nerves II through XII are intact. Muscle strength 4/5 in all extremities. Sensation intact. Gait not checked.  PSYCHIATRIC: The patient is alert and oriented x 3.  SKIN: No obvious rash, lesion, or ulcer.   DATA REVIEW:   CBC Recent Labs  Lab 08/03/18 0823  WBC 8.7  HGB 8.3*  HCT 24.3*  PLT 173    Chemistries  Recent Labs  Lab 08/03/18 0823  NA 134*   K 3.7  CL 110  CO2 17*  GLUCOSE 105*  BUN 16  CREATININE 0.80  CALCIUM 8.7*  AST 33  ALT 26  ALKPHOS 31*  BILITOT 0.5    Cardiac Enzymes No results for input(s): TROPONINI in the last 168 hours.  Microbiology Results  Results for orders placed or performed during the hospital encounter of 07/29/18  CULTURE, BLOOD (ROUTINE X 2) w Reflex to ID Panel     Status: None   Collection Time: 07/29/18  5:42 PM  Result Value Ref Range Status   Specimen Description BLOOD RT HAND  Final   Special Requests   Final    BOTTLES DRAWN AEROBIC AND ANAEROBIC Blood Culture results may not be optimal due to an inadequate volume of blood received in culture bottles   Culture   Final    NO GROWTH 5 DAYS Performed at [facility], [address] [place], [place] [address]    Report Status 08/03/2018 FINAL  Final  CULTURE, BLOOD (ROUTINE X 2) w Reflex to ID Panel     Status: None   Collection Time: 07/29/18  5:42 PM  Result Value Ref Range Status   Specimen Description BLOOD LEFT HAND  Final   Special Requests   Final    BOTTLES DRAWN AEROBIC ONLY Blood Culture results may not be optimal due to an inadequate volume of blood received in culture bottles   Culture   Final    NO GROWTH 5 DAYS Performed at [facility], [address], [place], [place] [address]    Report Status 08/03/2018 FINAL  Final    RADIOLOGY:  Dg Chest 2 View  Result Date: 08/03/2018 CLINICAL DATA:  Multifocal pneumonia. EXAM: CHEST - 2 VIEW COMPARISON:  Chest x-ray dated 07/22/2018 and chest CTs dated 07/29/2018 and 07/04/2018 FINDINGS: There is slight residual infiltrate in the left midzone. No other discrete pulmonary infiltrates. Multiple sclerotic bone metastases are noted. Multiple old healed left lateral rib fractures. Power port in place with the tip in the right atrium, unchanged. Heart size and vascularity are normal. Tortuosity and calcification of the thoracic aorta. No effusions. IMPRESSION:  Minimal residual hazy infiltrate in the left midzone. Unchanged osseous metastases. Aortic Atherosclerosis (ICD10-I70.0). Electronically Signed   By: Lorriane Shire M.D.   On: 08/03/2018 15:28    EKG:   Orders placed or performed during the hospital encounter  of 03/25/18  . ED EKG within 10 minutes  . ED EKG within 10 minutes  . EKG      Management plans discussed with the patient, family and they are in agreement.  CODE STATUS:  Code Status History    Date Active Date Inactive Code Status Order ID Comments User Context   07/29/2018 1820 08/01/2018 1516 Partial Code [id number]  Dustin Flock, MD Inpatient    Questions for Most Recent Historical Code Status (Order [id number])    Question Answer Comment   In the event of cardiac or respiratory ARREST: Initiate Code Blue, Call Rapid Response Yes    In the event of cardiac or respiratory ARREST: Perform CPR Yes    In the event of cardiac or respiratory ARREST: Perform Intubation/Mechanical Ventilation No    In the event of cardiac or respiratory ARREST: Use NIPPV/BiPAp only if indicated Yes    In the event of cardiac or respiratory ARREST: Administer ACLS medications if indicated Yes    In the event of cardiac or respiratory ARREST: Perform Defibrillation or Cardioversion if indicated Yes         Advance Directive Documentation     Most Recent Value  Type of Advance Directive  Healthcare Power of Attorney, Living will  Pre-existing out of facility DNR order (yellow form or pink MOST form)  -  "MOST" Form in Place?  -      TOTAL TIME TAKING CARE OF THIS PATIENT: 35 minutes.    Vaughan Basta M.D on 08/04/2018 at 5:30 PM  Between 7am to 6pm - Pager - [phone]  After 6pm go to [email or web address] - password [email or web address] [facility]  Office  [phone]  CC: Primary care physician; [place] Pink, MD   Note: This dictation was prepared with Dragon dictation along with smaller phrase technology. Any  transcriptional errors that result from this process are unintentional.

## 2018-08-01 NOTE — Plan of Care (Signed)
Pt is d/ced home.  Reviewed d/c paperwork and reviewed new medications ceftin, azithromycin and multi-vitamin.  Called [facility] b/c d/c paperwork sd he had infusion today and tomorrow.  They said for him to not come in [place] and Tues but come in at [facility] on Wed.  Deaccessed port.  Wheelchair called to take pt out - he was picked up by wife.  Also gave him Phone# to the floor b/c he didn't have glasses to review d/c paperwork.

## 2018-08-02 ENCOUNTER — Inpatient Hospital Stay: Payer: Medicare Other

## 2018-08-03 ENCOUNTER — Inpatient Hospital Stay: Payer: Medicare Other | Attending: Oncology | Admitting: Oncology

## 2018-08-03 ENCOUNTER — Inpatient Hospital Stay: Payer: Medicare Other

## 2018-08-03 ENCOUNTER — Ambulatory Visit
Admission: RE | Admit: 2018-08-03 | Discharge: 2018-08-03 | Disposition: A | Discharge status: Home / Self Care | Payer: Medicare Other | Attending: Oncology | Admitting: Oncology

## 2018-08-03 ENCOUNTER — Encounter: Payer: Self-pay | Admitting: Oncology

## 2018-08-03 ENCOUNTER — Other Ambulatory Visit: Payer: Self-pay

## 2018-08-03 ENCOUNTER — Ambulatory Visit
Admission: RE | Admit: 2018-08-03 | Discharge: 2018-08-03 | Disposition: A | Discharge status: Home / Self Care | Payer: Medicare Other | Source: Ambulatory Visit | Attending: Oncology | Admitting: Oncology

## 2018-08-03 VITALS — BP 107/65 | HR 88 | Temp 96.9°F | Resp 18 | Wt 165.0 lb

## 2018-08-03 DIAGNOSIS — E871 Hypo-osmolality and hyponatremia: Secondary | ICD-10-CM

## 2018-08-03 DIAGNOSIS — N179 Acute kidney failure, unspecified: Secondary | ICD-10-CM | POA: Diagnosis not present

## 2018-08-03 DIAGNOSIS — I2510 Atherosclerotic heart disease of native coronary artery without angina pectoris: Secondary | ICD-10-CM | POA: Diagnosis not present

## 2018-08-03 DIAGNOSIS — Z79899 Other long term (current) drug therapy: Secondary | ICD-10-CM | POA: Diagnosis not present

## 2018-08-03 DIAGNOSIS — R0602 Shortness of breath: Secondary | ICD-10-CM | POA: Diagnosis not present

## 2018-08-03 DIAGNOSIS — J189 Pneumonia, unspecified organism: Secondary | ICD-10-CM

## 2018-08-03 DIAGNOSIS — I10 Essential (primary) hypertension: Secondary | ICD-10-CM | POA: Diagnosis not present

## 2018-08-03 DIAGNOSIS — K219 Gastro-esophageal reflux disease without esophagitis: Secondary | ICD-10-CM | POA: Insufficient documentation

## 2018-08-03 DIAGNOSIS — C61 Malignant neoplasm of prostate: Secondary | ICD-10-CM | POA: Diagnosis not present

## 2018-08-03 DIAGNOSIS — R630 Anorexia: Secondary | ICD-10-CM | POA: Diagnosis not present

## 2018-08-03 DIAGNOSIS — Z87891 Personal history of nicotine dependence: Secondary | ICD-10-CM | POA: Diagnosis not present

## 2018-08-03 DIAGNOSIS — R634 Abnormal weight loss: Secondary | ICD-10-CM | POA: Insufficient documentation

## 2018-08-03 DIAGNOSIS — C7951 Secondary malignant neoplasm of bone: Secondary | ICD-10-CM | POA: Diagnosis not present

## 2018-08-03 DIAGNOSIS — D649 Anemia, unspecified: Secondary | ICD-10-CM

## 2018-08-03 DIAGNOSIS — Z515 Encounter for palliative care: Secondary | ICD-10-CM | POA: Diagnosis not present

## 2018-08-03 DIAGNOSIS — G893 Neoplasm related pain (acute) (chronic): Secondary | ICD-10-CM | POA: Insufficient documentation

## 2018-08-03 LAB — COMPREHENSIVE METABOLIC PANEL
ALBUMIN: 2.8 g/dL — AB (ref 3.5–5.0)
ALT: 26 U/L (ref 0–44)
AST: 33 U/L (ref 15–41)
Alkaline Phosphatase: 31 U/L — ABNORMAL LOW (ref 38–126)
Anion gap: 7 (ref 5–15)
BUN: 16 mg/dL (ref 8–23)
CO2: 17 mmol/L — ABNORMAL LOW (ref 22–32)
CREATININE: 0.8 mg/dL (ref 0.61–1.24)
Calcium: 8.7 mg/dL — ABNORMAL LOW (ref 8.9–10.3)
Chloride: 110 mmol/L (ref 98–111)
GFR calc Af Amer: >60 mL/min (ref >60)
GFR calc non Af Amer: >60 mL/min (ref >60)
Glucose, Bld: 105 mg/dL — ABNORMAL HIGH (ref 70–99)
Potassium: 3.7 mmol/L (ref 3.5–5.1)
Sodium: 134 mmol/L — ABNORMAL LOW (ref 135–145)
Total Bilirubin: 0.5 mg/dL (ref 0.3–1.2)
Total Protein: 6.4 g/dL — ABNORMAL LOW (ref 6.5–8.1)

## 2018-08-03 LAB — CBC WITH DIFFERENTIAL/PLATELET
Abs Immature Granulocytes: 0.09 10*3/uL — ABNORMAL HIGH (ref 0.00–0.07)
Basophils Absolute: 0 10*3/uL (ref 0.0–0.1)
Basophils Relative: 0 %
EOS PCT: 0 %
Eosinophils Absolute: 0 10*3/uL (ref 0.0–0.5)
HCT: 24.3 % — ABNORMAL LOW (ref 39.0–52.0)
HEMOGLOBIN: 8.3 g/dL — AB (ref 13.0–17.0)
Immature Granulocytes: 1 %
LYMPHS ABS: 1 10*3/uL (ref 0.7–4.0)
Lymphocytes Relative: 12 %
MCH: 27.9 pg (ref 26.0–34.0)
MCHC: 34.2 g/dL (ref 30.0–36.0)
MCV: 81.8 fL (ref 80.0–100.0)
Monocytes Absolute: 0.6 10*3/uL (ref 0.1–1.0)
Monocytes Relative: 7 %
Neutro Abs: 7 10*3/uL (ref 1.7–7.7)
Neutrophils Relative %: 80 %
Platelets: 173 10*3/uL (ref 150–400)
RBC: 2.97 MIL/uL — ABNORMAL LOW (ref 4.22–5.81)
RDW: 15.7 % — ABNORMAL HIGH (ref 11.5–15.5)
WBC: 8.7 10*3/uL (ref 4.0–10.5)
nRBC: 0 % (ref 0.0–0.2)

## 2018-08-03 LAB — CULTURE, BLOOD (ROUTINE X 2)
Culture: NO GROWTH
Culture: NO GROWTH

## 2018-08-03 LAB — SAMPLE TO BLOOD BANK

## 2018-08-03 MED ORDER — HEPARIN SOD (PORK) LOCK FLUSH 100 UNIT/ML IV SOLN
500.0000 [IU] | Freq: Once | INTRAVENOUS | Status: AC
  Administered 2018-08-03: 500 [IU] via INTRAVENOUS
  Filled 2018-08-03: qty 5

## 2018-08-03 MED ORDER — SODIUM CHLORIDE 0.9% FLUSH
10.0000 mL | Freq: Once | INTRAVENOUS | Status: AC
  Administered 2018-08-03: 10 mL via INTRAVENOUS
  Filled 2018-08-03: qty 10

## 2018-08-03 MED ORDER — MEGESTROL ACETATE 40 MG PO TABS: 80.0000 mg | ORAL_TABLET | Freq: Two times a day (BID) | ORAL | 3 refills | Status: AC

## 2018-08-03 MED ORDER — FENTANYL 50 MCG/HR TD PT72: 10.0000 | MEDICATED_PATCH | TRANSDERMAL | 0 refills | Status: AC

## 2018-08-03 NOTE — Progress Notes (Signed)
Patient here for follow up. Pt feeling better since he got out of hospital, but still not great. Requesting refill on fentanyl patches, hydroxyzine, and megestrol.

## 2018-08-03 NOTE — Progress Notes (Signed)
Hematology/Oncology  Follow up note [facility] [phone] [phone]  Patient Care Team: [place] Pink, MD as PCP - General (Family Medicine) Earlie Server, MD as Medical Oncologist (Medical Oncology)  REASON FOR VISIT Follow up for antineoplasm treatment of Prostate Cancer  HISTORY OF PRESENTING ILLNESS:  Ralf Konopka 85 y.o.  male with PMH listed as below who was referred by [facility] Urology Provider [name] to me for evaluation of clinical prostate cancer and management.  He has a history of elevated PSA and BPH. PSA trends as follows: 11/14/2014 4.3, 11/07/2015 7.1, 05/11/2016 10.4, 06/11/2016 10.6, 07/07/2016 7.8, 01/06/2017 86.8.  He was seen by urology and was diagnosed with clinical prostate cancer given the extreme elevation of PSA and prostate nodule. Biopsy was not pursued. He was treated with [name] on 01/29/2017,  # prostate biopsy on 03/10/2017. 5 out of 6 cores positive for prostate cancer, with highest gleason score group 9 (4+5), + perineural invasion.  Stage IV prostate cancer, multiple osseus metastatic disease. No visceral involvement.   01/18/2018  CT scan image was independently reviewed by me and discussed with patient.  Patient has progression of bone metastatic disease only  Current treatment  Patient got first [place] on 01/29/2018, switched to leupron on 04/13/2015,  Q6 months.  .Testosterone level at 14, castration level.  Upfront Gillermina Phy was added on December 2019 to assist fast disease control (phase II trial [place] B, [facility] Oncol. 2014) S/p palliative RT to lumber spine and SI joints. Xtandi discontinued on due to CNS toxicity in April 2019.  12/09/17 Abiaterone discontinued.  7/ 18/2019 Start Apalutimide 240mg  daily.   Trend of tumor marker  PSA 86.8 -->64.78 -->131--> 242 added Xtanti --> 17.74-->1.82 Xtandi stopped due to CNS toxicity in April 2019. --> 1.79- (added Abiaterone)-> 11.06-->24.53--> 39.48  [Abiaterone stopped, Added Apalutimid]--> 65.71-->88.63--> Xofigo treatment--> 215--> Back on Xtandi reduced dose.   12/09/17 Abiaterone discontinued due to rising PSA and disease progression. 12/16/2017 Start Apalutimide 240mg  daily. Held on 01/13/2018 due to rising PSA, also causing patient to feel dizziness/black out.  Started Xofigo treatment on 02/23/2018. 02/28/2018, back on Xtandi reduced dose 120mg . Xtandi stopped Dec.2020 due to   05/20/2018 Weekly Docetaxel 2 weeks on one week off.  Finish 6 cycles of Xofigo treatments on 07/13/2018. 1 unit of irradiated PRBC transfusion on 07/08/2018    INTERVAL HISTORY Patient presents to follow up for management of metastatic prostate cancer and post hospitalization follow-up. Patient was recently admitted from 07/29/2018-08/01/2018 for multi focal pneumonia. Patient received IV antibiotics and azithromycin during his admission.  Symptoms slightly better and was discharged home for follow-up.  Today patient reports still feeling weakness, some cough with whitish sputum, lack of appetite. Not back to his baseline yet. Denies any fever, chills, nausea or vomiting.  Denies any abdominal pain.  Denies any diarrhea. He still feels shortness of breath to some extent. Pain is well controlled.  He uses fentanyl patch 50 MCG every 72 hours. Appetite is poor.  Gained 2 pounds since last visit. Review of Systems  Constitutional: Positive for appetite change and fatigue. Negative for chills, fever and unexpected weight change.  HENT:   Negative for hearing loss and voice change.        Nasal congestion  Eyes: Negative for eye problems and icterus.  Respiratory: Positive for cough and shortness of breath. Negative for chest tightness.   Cardiovascular: Negative for chest pain and leg swelling.  Gastrointestinal: Negative for abdominal distention, abdominal pain and diarrhea.  Endocrine: Negative for hot flashes.  Genitourinary: Negative for difficulty urinating,  dysuria and frequency.   Musculoskeletal: Negative for arthralgias and back pain.  Skin: Negative for itching and rash.  Neurological: Negative for light-headedness and numbness.  Hematological: Negative for adenopathy. Does not bruise/bleed easily.  Psychiatric/Behavioral: Negative for confusion.    MEDICAL HISTORY:  Past Medical History:  Diagnosis Date  . Arthritis   . Benign enlargement of prostate   . CAD (coronary artery disease)   . Constipation   . Elevated PSA   . Family history of breast cancer   . Family history of prostate cancer   . GERD (gastroesophageal reflux disease)   . Hypertension   . Incomplete bladder emptying   . Nocturia   . Prostate cancer ([place]) 05/03/2017   Rad tx's and Lupron shots.   . Urgency of micturation   . Urinary frequency     SURGICAL HISTORY: Past Surgical History:  Procedure Laterality Date  . GALLBLADDER SURGERY    . PORTA CATH INSERTION N/A 06/07/2018   Procedure: PORTA CATH INSERTION;  Surgeon: Katha Cabal, MD;  Location: [facility];  Service: Cardiovascular;  Laterality: N/A;  . PROSTATE SURGERY    . STOMACH SURGERY      SOCIAL HISTORY: Social History   Socioeconomic History  . Marital status: Married    Spouse name: Not on file  . Number of children: Not on file  . Years of education: Not on file  . Highest education level: Not on file  Occupational History  . Not on file  Social Needs  . Financial resource strain: Not on file  . Food insecurity:    Worry: Not on file    Inability: Not on file  . Transportation needs:    Medical: Not on file    Non-medical: Not on file  Tobacco Use  . Smoking status: Former Smoker    Last attempt to quit: 06/01/1998    Years since quitting: 20.1  . Smokeless tobacco: Never Used  Substance and Sexual Activity  . Alcohol use: No    Alcohol/week: 0.0 standard drinks  . Drug use: Not Currently    Frequency: 2.0 times per week    Types: Marijuana    Comment:  marijuana two time monthly gives him an appetite  . Sexual activity: Not on file  Lifestyle  . Physical activity:    Days per week: Not on file    Minutes per session: Not on file  . Stress: Not on file  Relationships  . Social connections:    Talks on phone: Not on file    Gets together: Not on file    Attends religious service: Not on file    Active member of club or organization: Not on file    Attends meetings of clubs or organizations: Not on file    Relationship status: Not on file  . Intimate partner violence:    Fear of current or ex partner: Not on file    Emotionally abused: Not on file    Physically abused: Not on file    Forced sexual activity: Not on file  Other Topics Concern  . Not on file  Social History Narrative  . Not on file    FAMILY HISTORY: Family History  Problem Relation Age of Onset  . Hypertension Mother   . Stroke Father   . Breast cancer Niece        dx  under 30  . Breast cancer  Daughter 70  . Prostate cancer Son 75  . Kidney disease Neg Hx   . Kidney cancer Neg Hx   . Bladder Cancer Neg Hx     ALLERGIES:  has No Known Allergies.  MEDICATIONS:  Current Outpatient Medications  Medication Sig Dispense Refill  . aspirin EC 81 MG tablet Take 81 mg by mouth daily.     Marland Kitchen azithromycin (ZITHROMAX) 500 MG tablet Take 1 tablet (500 mg total) by mouth daily at 6 PM for 2 days. 2 tablet 0  . Calcium Carb-Cholecalciferol (CALCIUM-VITAMIN D) 500-200 MG-UNIT tablet Take 1 tablet by mouth daily. 90 tablet 0  . cefUROXime (CEFTIN) 250 MG tablet Take 1 tablet (250 mg total) by mouth 2 (two) times daily for 3 days. 6 tablet 0  . dexamethasone (DECADRON) 4 MG tablet Take 1 tablet (4 mg total) by mouth daily. 60 tablet 0  . famotidine (PEPCID) 20 MG tablet Take 1 tablet (20 mg total) by mouth daily. 30 tablet 0  . fentaNYL (DURAGESIC) 50 MCG/HR Place 10 patches onto the skin every 3 (three) days. 10 patch 0  . finasteride (PROSCAR) 5 MG tablet Take 1  tablet (5 mg total) by mouth daily. 90 tablet 3  . hydrocortisone 2.5 % cream APP EXT AA BID  0  . lidocaine-prilocaine (EMLA) cream Apply 1 application topically as needed. 30 g 3  . megestrol (MEGACE) 40 MG tablet Take 2 tablets (80 mg total) by mouth 2 (two) times daily. 120 tablet 0  . mirabegron ER (MYRBETRIQ) 25 MG TB24 tablet Take 1 tablet (25 mg total) by mouth daily. 90 tablet 3  . mirtazapine (REMERON) 15 MG tablet Take 15 mg by mouth at bedtime.  11  . Multiple Vitamin (MULTIVITAMIN WITH MINERALS) TABS tablet Take 1 tablet by mouth daily. 30 tablet 0  . nortriptyline (PAMELOR) 25 MG capsule Take 25 mg by mouth at bedtime. Reported on 05/16/2015  1  . nystatin (MYCOSTATIN) 100000 UNIT/ML suspension Take 5 mLs (500,000 Units total) by mouth 3 (three) times daily. 473 mL 0  . omeprazole (PRILOSEC) 20 MG capsule TAKE 1 CAPSULE(20 MG) BY MOUTH DAILY 30 capsule 4  . ondansetron (ZOFRAN) 8 MG tablet Take 1 tablet (8 mg total) by mouth 2 (two) times daily as needed for refractory nausea / vomiting. 30 tablet 1  . Oxycodone HCl 10 MG TABS Take 1 tablet (10 mg total) by mouth every 6 (six) hours as needed. 60 tablet 0  . polyethylene glycol (MIRALAX / GLYCOLAX) packet Take 17 g by mouth daily. Mix one tablespoon with 8oz of your favorite juice or water every day until you are having soft formed stools. Then start taking once daily if you didn't have a stool the day before. 30 each 0  . prochlorperazine (COMPAZINE) 10 MG tablet Take 1 tablet (10 mg total) by mouth every 6 (six) hours as needed (Nausea or vomiting). 30 tablet 1  . pyridOXINE (VITAMIN B-6) 50 MG tablet Take 50 mg by mouth daily.     Marland Kitchen senna-docusate (SENOKOT-S) 8.6-50 MG tablet Take 2 tablets by mouth daily. 60 tablet 3  . tamsulosin (FLOMAX) 0.4 MG CAPS capsule Take 1 capsule (0.4 mg total) by mouth daily. 90 capsule 3  . triamcinolone cream (KENALOG) 0.1 % APP EXT AA BID     No current facility-administered medications for this  visit.    Facility-Administered Medications Ordered in Other Visits  Medication Dose Route Frequency Provider Last Rate Last Dose  . sodium  chloride flush (NS) 0.9 % injection 10 mL  10 mL Intravenous PRN Earlie Server, MD   10 mL at 07/15/18 0847    PHYSICAL EXAMINATION: ECOG PERFORMANCE STATUS: 1 - Symptomatic but completely ambulatory Vitals:   08/03/18 0846  BP: 107/65  Pulse: 88  Resp: 18  Temp: (!) 96.9 F (36.1 C)  SpO2: 97%   Filed Weights   08/03/18 0846  Weight: 165 lb (74.8 kg)   Physical Exam Constitutional:      General: He is not in acute distress.    Appearance: He is ill-appearing. He is not diaphoretic.  HENT:     Head: Normocephalic and atraumatic.     Mouth/Throat:     Pharynx: No oropharyngeal exudate.  Eyes:     General: No scleral icterus.    Conjunctiva/sclera: Conjunctivae normal.     Pupils: Pupils are equal, round, and reactive to light.  Neck:     Musculoskeletal: Normal range of motion and neck supple.     Thyroid: No thyromegaly.  Cardiovascular:     Rate and Rhythm: Normal rate and regular rhythm.     Heart sounds: Normal heart sounds. No murmur.  Pulmonary:     Effort: Pulmonary effort is normal. No respiratory distress.     Breath sounds: No wheezing.  Abdominal:     General: Bowel sounds are normal. There is no distension.     Palpations: Abdomen is soft. There is no mass.     Tenderness: There is no abdominal tenderness.  Musculoskeletal: Normal range of motion.        General: No deformity.  Skin:    General: Skin is warm and dry.     Coloration: Skin is pale.     Findings: No erythema or rash.  Neurological:     Mental Status: He is alert and oriented to person, place, and time.     Cranial Nerves: No cranial nerve deficit.     Coordination: Coordination normal.  Psychiatric:        Behavior: Behavior normal.        Thought Content: Thought content normal.    LABORATORY DATA:  I have reviewed the data as listed Lab Results   Component Value Date   WBC 8.7 08/03/2018   HGB 8.3 (L) 08/03/2018   HCT 24.3 (L) 08/03/2018   MCV 81.8 08/03/2018   PLT 173 08/03/2018   Recent Labs    07/25/18 0912 07/29/18 0845 07/30/18 0646 08/03/18 0823  NA 129* 129* 133* 134*  K 3.8 3.6 3.8 3.7  CL 103 105 108 110  CO2 15* 17* 18* 17*  GLUCOSE 123* 133* 89 105*  BUN 16 11 11 16   CREATININE 1.10 1.02 0.85 0.80  CALCIUM 8.5* 8.0* 7.7* 8.7*  GFRNONAA >60 >60 >60 >60  GFRAA >60 >60 >60 >60  PROT 6.7 6.4*  --  6.4*  ALBUMIN 3.2* 2.9*  --  2.8*  AST 34 35  --  33  ALT 20 22  --  26  ALKPHOS 33* 35*  --  31*  BILITOT 0.5 0.5  --  0.5   RADIOGRAPHIC STUDIES: I have personally reviewed the radiological images as listed and agreed with the findings in the report. 12/15/2017 Bone scan whole body showed new sites of osseous tracer accumulation are identified especially to size at the distal left femoral diaphysis, L2 vertebral body, right iliac bone, right ischium. 01/18/2018 chest abdomen pelvis with contrast Extensive metastasis disease to bone, with all bone lesions  new from the abdomen and pelvis CT dated 9/5 2018 and a chest CT dated 06/19/2013.  Involvement is similar to the recent bone scan.  No other evidence of metastatic disease.  No acute abnormalities in the chest abdomen and pelvis.  03/14/2018 CT chest Angio PE protocol 1. No acute cardiopulmonary abnormalities. No evidence for acute pulmonary embolus 2. Coronary artery atherosclerotic calcifications 3.  Aortic Atherosclerosis (ICD10-I70.0). 4. Sclerotic bone metastasis as noted previously  03/25/2018 CT abdomen pelvis with contrast 1. No acute abnormality in the abdomen or pelvis. 2. Extensive metastatic bone disease. Findings are similar to theexam on 01/18/2018.  3. Nonobstructive left kidney stone. 4. Stable left adrenal nodule.  ASSESSMENT & PLAN: 85 y.o. male follows up for management of metastatic prostate cancer and neoplasm related symptoms..  1.  Multifocal pneumonia   2. Prostate cancer ([place])   3. Shortness of breath   4. Bone metastasis ([place])   5. Normocytic anemia    #Multifocal pneumonia, status post recent hospitalization after multifocal pneumonia.   Still feeling weak and shortness of breath.  Is compliant with outpatient antibiotics. We will proceed with another chest x-ray. Respiratory virus panel negative. No travel history or close sick contacts recently.   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 ADT through urology office.  Finished 6 cycles of Xofigo treatment.   Continue to hold chemotherapy due to fatigue and weakness.   # Hyponatremia, improved.  Continue to monitor. $ Normocytic anemia, hemoglobin 8.3.  Better than last visit.  Continue to monitor.  No blood transfusion today. Orders Placed This Encounter  Procedures  . DG Chest 2 View    Standing Status:   Future    Number of Occurrences:   1    Standing Expiration Date:   08/03/2019    Order Specific Question:   Reason for Exam (SYMPTOM  OR DIAGNOSIS REQUIRED)    Answer:   pneumonia    Order Specific Question:   Preferred imaging location?    Answer:   [facility]    Order Specific Question:   Radiology Contrast Protocol - do NOT remove file path    Answer:   [email or web address]  Follow-up in 2 weeks.  Repeat CBC CMP PSA We spent sufficient time to discuss many aspect of care, questions were answered to patient's satisfaction. Total face to face encounter time for this patient visit was 25 min. >50% of the time was  spent in counseling and coordination of care.   Earlie Server, MD, PhD Hematology Oncology [facility] Pager- [phone]

## 2018-08-05 ENCOUNTER — Other Ambulatory Visit: Payer: Medicare Other

## 2018-08-05 ENCOUNTER — Ambulatory Visit: Payer: Medicare Other | Admitting: Oncology

## 2018-08-05 ENCOUNTER — Ambulatory Visit: Payer: Medicare Other

## 2018-08-09 ENCOUNTER — Other Ambulatory Visit: Payer: Self-pay

## 2018-08-09 ENCOUNTER — Inpatient Hospital Stay
Admission: EM | Admit: 2018-08-09 | Discharge: 2018-08-11 | DRG: 176 | Disposition: A | Discharge status: Home / Self Care | Payer: Medicare Other | Attending: Internal Medicine | Admitting: Internal Medicine

## 2018-08-09 ENCOUNTER — Emergency Department: Payer: Medicare Other

## 2018-08-09 ENCOUNTER — Encounter: Payer: Self-pay | Admitting: Emergency Medicine

## 2018-08-09 DIAGNOSIS — I119 Hypertensive heart disease without heart failure: Secondary | ICD-10-CM | POA: Diagnosis present

## 2018-08-09 DIAGNOSIS — I2699 Other pulmonary embolism without acute cor pulmonale: Secondary | ICD-10-CM | POA: Diagnosis present

## 2018-08-09 DIAGNOSIS — I82463 Acute embolism and thrombosis of calf muscular vein, bilateral: Secondary | ICD-10-CM | POA: Diagnosis present

## 2018-08-09 DIAGNOSIS — I82452 Acute embolism and thrombosis of left peroneal vein: Secondary | ICD-10-CM | POA: Diagnosis present

## 2018-08-09 DIAGNOSIS — M1990 Unspecified osteoarthritis, unspecified site: Secondary | ICD-10-CM | POA: Diagnosis present

## 2018-08-09 DIAGNOSIS — I2510 Atherosclerotic heart disease of native coronary artery without angina pectoris: Secondary | ICD-10-CM | POA: Diagnosis present

## 2018-08-09 DIAGNOSIS — Z87891 Personal history of nicotine dependence: Secondary | ICD-10-CM | POA: Diagnosis not present

## 2018-08-09 DIAGNOSIS — Z7982 Long term (current) use of aspirin: Secondary | ICD-10-CM

## 2018-08-09 DIAGNOSIS — Z8546 Personal history of malignant neoplasm of prostate: Secondary | ICD-10-CM | POA: Diagnosis not present

## 2018-08-09 DIAGNOSIS — Z923 Personal history of irradiation: Secondary | ICD-10-CM

## 2018-08-09 DIAGNOSIS — Z79891 Long term (current) use of opiate analgesic: Secondary | ICD-10-CM | POA: Diagnosis not present

## 2018-08-09 DIAGNOSIS — Z79899 Other long term (current) drug therapy: Secondary | ICD-10-CM

## 2018-08-09 DIAGNOSIS — K219 Gastro-esophageal reflux disease without esophagitis: Secondary | ICD-10-CM | POA: Diagnosis present

## 2018-08-09 DIAGNOSIS — Z8042 Family history of malignant neoplasm of prostate: Secondary | ICD-10-CM | POA: Diagnosis not present

## 2018-08-09 DIAGNOSIS — R0600 Dyspnea, unspecified: Secondary | ICD-10-CM | POA: Diagnosis not present

## 2018-08-09 DIAGNOSIS — D638 Anemia in other chronic diseases classified elsewhere: Secondary | ICD-10-CM | POA: Diagnosis present

## 2018-08-09 DIAGNOSIS — I82441 Acute embolism and thrombosis of right tibial vein: Secondary | ICD-10-CM | POA: Diagnosis present

## 2018-08-09 DIAGNOSIS — Z823 Family history of stroke: Secondary | ICD-10-CM

## 2018-08-09 DIAGNOSIS — C61 Malignant neoplasm of prostate: Secondary | ICD-10-CM | POA: Diagnosis present

## 2018-08-09 DIAGNOSIS — Z8249 Family history of ischemic heart disease and other diseases of the circulatory system: Secondary | ICD-10-CM

## 2018-08-09 DIAGNOSIS — Z803 Family history of malignant neoplasm of breast: Secondary | ICD-10-CM | POA: Diagnosis not present

## 2018-08-09 DIAGNOSIS — I82409 Acute embolism and thrombosis of unspecified deep veins of unspecified lower extremity: Secondary | ICD-10-CM

## 2018-08-09 DIAGNOSIS — C7951 Secondary malignant neoplasm of bone: Secondary | ICD-10-CM | POA: Diagnosis present

## 2018-08-09 DIAGNOSIS — R350 Frequency of micturition: Secondary | ICD-10-CM | POA: Diagnosis present

## 2018-08-09 DIAGNOSIS — Z0000 Encounter for general adult medical examination without abnormal findings: Secondary | ICD-10-CM

## 2018-08-09 LAB — PROTIME-INR
INR: 1.1 (ref 0.8–1.2)
Prothrombin Time: 14.2 seconds (ref 11.4–15.2)

## 2018-08-09 LAB — BASIC METABOLIC PANEL
Anion gap: 12 (ref 5–15)
BUN: 20 mg/dL (ref 8–23)
CO2: 19 mmol/L — ABNORMAL LOW (ref 22–32)
Calcium: 9.7 mg/dL (ref 8.9–10.3)
Chloride: 102 mmol/L (ref 98–111)
Creatinine, Ser: 1.2 mg/dL (ref 0.61–1.24)
GFR calc Af Amer: >60 mL/min (ref >60)
GFR calc non Af Amer: 54 mL/min — ABNORMAL LOW (ref >60)
Glucose, Bld: 94 mg/dL (ref 70–99)
Potassium: 4.4 mmol/L (ref 3.5–5.1)
Sodium: 133 mmol/L — ABNORMAL LOW (ref 135–145)

## 2018-08-09 LAB — CBC WITH DIFFERENTIAL/PLATELET
Abs Immature Granulocytes: 0.09 10*3/uL — ABNORMAL HIGH (ref 0.00–0.07)
Basophils Absolute: 0 10*3/uL (ref 0.0–0.1)
Basophils Relative: 0 %
Eosinophils Absolute: 0.3 10*3/uL (ref 0.0–0.5)
Eosinophils Relative: 3 %
HCT: 32 % — ABNORMAL LOW (ref 39.0–52.0)
Hemoglobin: 10.6 g/dL — ABNORMAL LOW (ref 13.0–17.0)
Immature Granulocytes: 1 %
Lymphocytes Relative: 19 %
Lymphs Abs: 1.8 10*3/uL (ref 0.7–4.0)
MCH: 27.7 pg (ref 26.0–34.0)
MCHC: 33.1 g/dL (ref 30.0–36.0)
MCV: 83.6 fL (ref 80.0–100.0)
Monocytes Absolute: 0.9 10*3/uL (ref 0.1–1.0)
Monocytes Relative: 9 %
NEUTROS ABS: 6.7 10*3/uL (ref 1.7–7.7)
Neutrophils Relative %: 68 %
Platelets: 176 10*3/uL (ref 150–400)
RBC: 3.83 MIL/uL — ABNORMAL LOW (ref 4.22–5.81)
RDW: 16.1 % — ABNORMAL HIGH (ref 11.5–15.5)
WBC: 9.7 10*3/uL (ref 4.0–10.5)
nRBC: 0 % (ref 0.0–0.2)

## 2018-08-09 LAB — TROPONIN I

## 2018-08-09 LAB — APTT: APTT: 27 s (ref 24–36)

## 2018-08-09 LAB — BRAIN NATRIURETIC PEPTIDE: B Natriuretic Peptide: 33 pg/mL (ref 0.0–100.0)

## 2018-08-09 MED ORDER — NORTRIPTYLINE HCL 25 MG PO CAPS
25.0000 mg | ORAL_CAPSULE | Freq: Every day | ORAL | Status: DC
  Administered 2018-08-10 (×2): 25 mg via ORAL
  Filled 2018-08-09 (×3): qty 1

## 2018-08-09 MED ORDER — PANTOPRAZOLE SODIUM 40 MG PO TBEC
40.0000 mg | DELAYED_RELEASE_TABLET | Freq: Every day | ORAL | Status: DC
  Administered 2018-08-10 – 2018-08-11 (×2): 40 mg via ORAL
  Filled 2018-08-09 (×2): qty 1

## 2018-08-09 MED ORDER — ACETAMINOPHEN 325 MG PO TABS: 650.0000 mg | ORAL_TABLET | Freq: Four times a day (QID) | ORAL | Status: DC | PRN

## 2018-08-09 MED ORDER — ADULT MULTIVITAMIN W/MINERALS CH
1.0000 | ORAL_TABLET | Freq: Every day | ORAL | Status: DC
  Administered 2018-08-10 – 2018-08-11 (×2): 1 via ORAL
  Filled 2018-08-09 (×2): qty 1

## 2018-08-09 MED ORDER — CALCIUM CARBONATE-VITAMIN D 500-200 MG-UNIT PO TABS
1.0000 | ORAL_TABLET | Freq: Every day | ORAL | Status: DC
  Administered 2018-08-10 – 2018-08-11 (×2): 1 via ORAL
  Filled 2018-08-09 (×2): qty 1

## 2018-08-09 MED ORDER — ONDANSETRON HCL 4 MG PO TABS: 8.0000 mg | ORAL_TABLET | Freq: Two times a day (BID) | ORAL | Status: DC | PRN

## 2018-08-09 MED ORDER — ACETAMINOPHEN 650 MG RE SUPP: 650.0000 mg | Freq: Four times a day (QID) | RECTAL | Status: DC | PRN

## 2018-08-09 MED ORDER — SODIUM CHLORIDE 0.9 % IV SOLN: 250.0000 mL | INTRAVENOUS | Status: DC | PRN

## 2018-08-09 MED ORDER — HEPARIN (PORCINE) 25000 UT/250ML-% IV SOLN
1200.0000 [IU]/h | INTRAVENOUS | Status: DC
  Administered 2018-08-09: 1200 [IU]/h via INTRAVENOUS
  Filled 2018-08-09: qty 250

## 2018-08-09 MED ORDER — MEGESTROL ACETATE 20 MG PO TABS
80.0000 mg | ORAL_TABLET | Freq: Two times a day (BID) | ORAL | Status: DC
  Administered 2018-08-10 – 2018-08-11 (×4): 80 mg via ORAL
  Filled 2018-08-09 (×5): qty 4

## 2018-08-09 MED ORDER — OXYCODONE HCL 5 MG PO TABS: 10.0000 mg | ORAL_TABLET | Freq: Four times a day (QID) | ORAL | Status: DC | PRN

## 2018-08-09 MED ORDER — SENNOSIDES-DOCUSATE SODIUM 8.6-50 MG PO TABS
2.0000 | ORAL_TABLET | Freq: Every day | ORAL | Status: DC
  Administered 2018-08-10 – 2018-08-11 (×2): 2 via ORAL
  Filled 2018-08-09 (×2): qty 2

## 2018-08-09 MED ORDER — ASPIRIN EC 81 MG PO TBEC
81.0000 mg | DELAYED_RELEASE_TABLET | Freq: Every day | ORAL | Status: DC
  Filled 2018-08-09: qty 1

## 2018-08-09 MED ORDER — IOHEXOL 350 MG/ML SOLN
75.0000 mL | Freq: Once | INTRAVENOUS | Status: AC | PRN
  Administered 2018-08-09: 75 mL via INTRAVENOUS

## 2018-08-09 MED ORDER — VITAMIN B-6 50 MG PO TABS
50.0000 mg | ORAL_TABLET | Freq: Every day | ORAL | Status: DC
  Administered 2018-08-10 – 2018-08-11 (×2): 50 mg via ORAL
  Filled 2018-08-09 (×2): qty 1

## 2018-08-09 MED ORDER — POLYETHYLENE GLYCOL 3350 17 G PO PACK: 17.0000 g | PACK | Freq: Every day | ORAL | Status: DC

## 2018-08-09 MED ORDER — HEPARIN BOLUS VIA INFUSION
3800.0000 [IU] | Freq: Once | INTRAVENOUS | Status: AC
  Administered 2018-08-09: 3800 [IU] via INTRAVENOUS
  Filled 2018-08-09: qty 3800

## 2018-08-09 MED ORDER — FINASTERIDE 5 MG PO TABS
5.0000 mg | ORAL_TABLET | Freq: Every day | ORAL | Status: DC
  Administered 2018-08-10 – 2018-08-11 (×2): 5 mg via ORAL
  Filled 2018-08-09 (×2): qty 1

## 2018-08-09 MED ORDER — SODIUM CHLORIDE 0.9% FLUSH: 3.0000 mL | INTRAVENOUS | Status: DC | PRN

## 2018-08-09 MED ORDER — PROCHLORPERAZINE MALEATE 10 MG PO TABS
10.0000 mg | ORAL_TABLET | Freq: Four times a day (QID) | ORAL | Status: DC | PRN
  Filled 2018-08-09: qty 1

## 2018-08-09 MED ORDER — MIRTAZAPINE 15 MG PO TABS
15.0000 mg | ORAL_TABLET | Freq: Every day | ORAL | Status: DC
  Administered 2018-08-10 (×2): 15 mg via ORAL
  Filled 2018-08-09 (×2): qty 1

## 2018-08-09 MED ORDER — SODIUM CHLORIDE 0.9% FLUSH: 3.0000 mL | Freq: Two times a day (BID) | INTRAVENOUS | Status: DC

## 2018-08-09 MED ORDER — IPRATROPIUM-ALBUTEROL 0.5-2.5 (3) MG/3ML IN SOLN
3.0000 mL | Freq: Once | RESPIRATORY_TRACT | Status: AC
  Administered 2018-08-09: 3 mL via RESPIRATORY_TRACT
  Filled 2018-08-09: qty 3

## 2018-08-09 NOTE — Progress Notes (Signed)
Advanced care plan. Purpose of the Encounter: CODE STATUS Parties in Attendance: Patient and family Patient's Decision Capacity: Good Subjective/Patient's story: 85 year old male patient with history of coronary disease, metastatic prostate cancer, GERD, hypertension presented for shortness of breath Objective/Medical story Patient was worked up with CTA chest which showed pulmonary edema needs IV heparin drip for anticoagulation. Goals of care determination:  Advance care directives goals of care and treatment plan discussed Patient does not want intubation and ventilator, he is okay with CPR, cardiac resuscitation and defibrillation CODE STATUS: Partial code Time spent discussing advanced care planning: 16 minutes

## 2018-08-09 NOTE — ED Notes (Addendum)
ED TO INPATIENT HANDOFF REPORT  ED Nurse Name and Phone #:  Elmo Putt #[phone]  S Name/Age/Gender Lucia Estelle 85 y.o. male Room/Bed: ED09A/ED09A  Code Status   Code Status: Prior  Home/SNF/Other Home Patient oriented to: self, place, time and situation Is this baseline? Yes   Triage Complete: Triage complete  Chief Complaint sob and weakness  Triage Note Here for [facility]. Discharged last Monday after being admitted for PNA.  Pt labored in triage.  No longer on abx.  Pt is cancer pt.  VSS at this time other than pt has noted tachypnea.  Thick productive sputum per pt.  Chemo has been held because pt has been too sick for it.   Allergies No Known Allergies  Level of Care/Admitting Diagnosis ED Disposition    ED Disposition Condition [facility] Area: [place] [[id number]]  Level of Care: Med-Surg [16]  Diagnosis: Pulmonary embolism [facility]) [[id number]]  Admitting Physician: Saundra Shelling [[id number]]  Attending Physician: Saundra Shelling [[id number]]  Estimated length of stay: past midnight tomorrow  Certification:: I certify this patient will need inpatient services for at least 2 midnights  PT Class (Do Not Modify): Inpatient [101]  PT Acc Code (Do Not Modify): Private [1]       B Medical/Surgery History Past Medical History:  Diagnosis Date  . Arthritis   . Benign enlargement of prostate   . CAD (coronary artery disease)   . Constipation   . Elevated PSA   . Family history of breast cancer   . Family history of prostate cancer   . GERD (gastroesophageal reflux disease)   . Hypertension   . Incomplete bladder emptying   . Nocturia   . Prostate cancer ([place]) 05/03/2017   Rad tx's and Lupron shots.   . Urgency of micturation   . Urinary frequency    Past Surgical History:  Procedure Laterality Date  . GALLBLADDER SURGERY    . PORTA CATH INSERTION N/A 06/07/2018   Procedure: PORTA CATH INSERTION;  Surgeon: Katha Cabal, MD;  Location:  [place] [facility];  Service: Cardiovascular;  Laterality: N/A;  . PROSTATE SURGERY    . STOMACH SURGERY       A IV Location/Drains/Wounds Patient Lines/Drains/Airways Status   Active Line/Drains/Airways    Name:   Placement date:   Placement time:   Site:   Days:   Implanted Port Right Chest   -    -    Chest      Peripheral IV 08/09/18 Right Antecubital   08/09/18    1830    Antecubital   less than 1          Intake/Output Last 24 hours No intake or output data in the 24 hours ending 08/09/18 2108  Labs/Imaging Results for orders placed or performed during the hospital encounter of 08/09/18 (from the past 48 hour(s))  Basic metabolic panel     Status: Abnormal   Collection Time: 08/09/18  5:44 PM  Result Value Ref Range   Sodium 133 (L) 135 - 145 mmol/L   Potassium 4.4 3.5 - 5.1 mmol/L   Chloride 102 98 - 111 mmol/L   CO2 19 (L) 22 - 32 mmol/L   Glucose, Bld 94 70 - 99 mg/dL   BUN 20 8 - 23 mg/dL   Creatinine, Ser 1.20 0.61 - 1.24 mg/dL   Calcium 9.7 8.9 - 10.3 mg/dL   GFR calc non Af Amer 54 (L) >60 mL/min  GFR calc Af Amer >60 >60 mL/min   Anion gap 12 5 - 15    Comment: Performed at [facility], [address] [place], [place] [address]  Troponin I - ONCE - STAT     Status: None   Collection Time: 08/09/18  5:44 PM  Result Value Ref Range   Troponin I <0.03 <0.03 ng/mL    Comment: Performed at [facility], [address] [place], [place] [address]  CBC with Differential     Status: Abnormal   Collection Time: 08/09/18  5:44 PM  Result Value Ref Range   WBC 9.7 4.0 - 10.5 K/uL   RBC 3.83 (L) 4.22 - 5.81 MIL/uL   Hemoglobin 10.6 (L) 13.0 - 17.0 g/dL   HCT 32.0 (L) 39.0 - 52.0 %   MCV 83.6 80.0 - 100.0 fL   MCH 27.7 26.0 - 34.0 pg   MCHC 33.1 30.0 - 36.0 g/dL   RDW 16.1 (H) 11.5 - 15.5 %   Platelets 176 150 - 400 K/uL   nRBC 0.0 0.0 - 0.2 %   Neutrophils Relative % 68 %   Neutro Abs 6.7 1.7 - 7.7 K/uL   Lymphocytes Relative 19 %    Lymphs Abs 1.8 0.7 - 4.0 K/uL   Monocytes Relative 9 %   Monocytes Absolute 0.9 0.1 - 1.0 K/uL   Eosinophils Relative 3 %   Eosinophils Absolute 0.3 0.0 - 0.5 K/uL   Basophils Relative 0 %   Basophils Absolute 0.0 0.0 - 0.1 K/uL   Immature Granulocytes 1 %   Abs Immature Granulocytes 0.09 (H) 0.00 - 0.07 K/uL    Comment: Performed at [facility], [address], [place], [place] [address]  Brain natriuretic peptide     Status: None   Collection Time: 08/09/18  5:44 PM  Result Value Ref Range   B Natriuretic Peptide 33.0 0.0 - 100.0 pg/mL    Comment: Performed at [facility], [address] [place], [place] [address]  Protime-INR     Status: None   Collection Time: 08/09/18  7:31 PM  Result Value Ref Range   Prothrombin Time 14.2 11.4 - 15.2 seconds   INR 1.1 0.8 - 1.2    Comment: (NOTE) INR goal varies based on device and disease states. Performed at [facility], [address] [place],  [address]   APTT     Status: None   Collection Time: 08/09/18  7:31 PM  Result Value Ref Range   aPTT 27 24 - 36 seconds    Comment: Performed at [facility], [address] [place],  [address]   Dg Chest 2 View  Result Date: 08/09/2018 CLINICAL DATA:  Shortness of breath EXAM: CHEST - 2 VIEW COMPARISON:  Chest radiograph 08/03/2018, chest CT 07/29/2018 FINDINGS: Normal heart size. Right chest wall Port-A-Cath tip is in the right atrium. Areas of mild opacity within the left lung are unchanged. No new area of consolidation. No pleural effusion or pulmonary edema. Gas-filled colon interposed anterior to the liver beneath right hemidiaphragm. Multifocal osseous metastatic disease. IMPRESSION: Unchanged left mid lung opacities. Electronically Signed   By: Ulyses Jarred M.D.   On: 08/09/2018 18:20   Ct Angio Chest Pe W And/or Wo Contrast  Result Date: 08/09/2018 CLINICAL DATA:  85 year old male with acute shortness of breath. History of  prostate cancer. EXAM: CT ANGIOGRAPHY CHEST WITH CONTRAST TECHNIQUE: Multidetector CT imaging of the chest was performed using the standard  protocol during bolus administration of intravenous contrast. Multiplanar CT image reconstructions and MIPs were obtained to evaluate the vascular anatomy. CONTRAST:  70mL OMNIPAQUE IOHEXOL 350 MG/ML SOLN COMPARISON:  None. FINDINGS: Cardiovascular: This is a technically satisfactory study. A subsegmental pulmonary embolus within the RIGHT middle lobe (series 5: Images 163-168) and within the RIGHT LOWER lobe (5:143-149) noted. Mild cardiomegaly noted without CT evidence of RIGHT heart strain. Coronary artery atherosclerotic calcifications noted. No evidence of thoracic aortic aneurysm or pericardial effusion. Mediastinum/Nodes: No enlarged mediastinal, hilar, or axillary lymph nodes. Thyroid gland, trachea, and esophagus demonstrate no significant findings. Lungs/Pleura: Unchanged linear ground-glass opacity within the LEFT UPPER lobe noted. No airspace disease, consolidation, mass, pleural effusion or pneumothorax. Upper Abdomen: No acute abnormality Musculoskeletal: Sclerotic lesions within the visualized bones again noted compatible with metastatic disease. No new or acute bony abnormalities are identified. Review of the MIP images confirms the above findings. IMPRESSION: 1. Subsegmental pulmonary embolus within the RIGHT middle lobe and within the RIGHT LOWER lobe. 2. Diffuse sclerotic bony metastases again noted. 3. Unchanged linear ground-glass opacity within the LEFT UPPER lobe, nonspecific and unchanged from 07/29/2018. 4. Mild cardiomegaly and coronary artery disease. Critical Value/emergent results were called by telephone at the time of interpretation on 08/09/2018 at 7:31 pm to Dr. Charlotte Crumb , who verbally acknowledged these results. Electronically Signed   By: Margarette Canada M.D.   On: 08/09/2018 19:31    Pending Labs Unresulted Labs (From admission, onward)     Start     Ordered   08/10/18 0500  Heparin level (unfractionated)  Once-Timed,   STAT     08/09/18 1949   08/10/18 0500  CBC  Tomorrow morning,   STAT     08/09/18 1949   Signed and Held  Basic metabolic panel  Tomorrow morning,   R     Signed and Held   Signed and Held  CBC  Tomorrow morning,   R     Signed and Held          Vitals/Pain Today's Vitals   08/09/18 1959 08/09/18 2000 08/09/18 2003 08/09/18 2004  BP:  117/70    Pulse: 87 84 87   Resp: (!) 24 20 (!) 21   Temp:      TempSrc:      SpO2: 100% 99% 98%   Weight:      Height:      PainSc:    0-No pain    Isolation Precautions No active isolations  Medications Medications  heparin bolus via infusion 3,800 Units (has no administration in time range)  heparin ADULT infusion 100 units/mL (25000 units/258mL sodium chloride 0.45%) (has no administration in time range)  ipratropium-albuterol (DUONEB) 0.5-2.5 (3) MG/3ML nebulizer solution 3 mL (3 mLs Nebulization Given 08/09/18 1937)  iohexol (OMNIPAQUE) 350 MG/ML injection 75 mL (75 mLs Intravenous Contrast Given 08/09/18 1847)    Mobility walks Low fall risk   Focused Assessments   R Recommendations: See Admitting Provider Note  Report given to: Kennyth Lose (1C RN)  Additional Notes:

## 2018-08-09 NOTE — ED Provider Notes (Addendum)
[facility] Emergency Department Provider Note  ____________________________________________   I have reviewed the triage vital signs and the nursing notes. Where available I have reviewed prior notes and, if possible and indicated, outside hospital notes.    HISTORY  Chief Complaint Shortness of Breath    HPI Matthew Hunter is a 85 y.o. male with a history of metastatic prostate cancer presents today complaining of shortness of breath "for 2 weeks".  He was admitted for multifocal pneumonia about a week ago, and since that time after his discharge he has had continual dyspnea.  No chest pain.  No leg swelling.  He just feels that he cannot catch his breath.  Family states he is been breathing fast for 2 weeks.  No fevers no chills no cough.  No other alleviating or aggravating symptoms no other prior treatment, was on antibiotics no longer is.  Past Medical History:  Diagnosis Date  . Arthritis   . Benign enlargement of prostate   . CAD (coronary artery disease)   . Constipation   . Elevated PSA   . Family history of breast cancer   . Family history of prostate cancer   . GERD (gastroesophageal reflux disease)   . Hypertension   . Incomplete bladder emptying   . Nocturia   . Prostate cancer ([place]) 05/03/2017   Rad tx's and Lupron shots.   . Urgency of micturation   . Urinary frequency     Patient Active Problem List   Diagnosis Date Noted  . Protein-calorie malnutrition, severe 07/31/2018  . PNA (pneumonia) 07/29/2018  . Family history of breast cancer   . Family history of prostate cancer   . 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 Goals of care, counseling/discussion 12/17/2017  . Prostate cancer ([place]) 05/03/2017  . Arthritis 06/11/2016  . History of alcohol abuse 06/11/2016  . Hx of colonic polyp 06/11/2016  . Hypertension 06/11/2016  . Internal hemorrhoids 06/11/2016  . Elevated PSA 05/16/2015  . BPH (benign prostatic hyperplasia)  11/22/2014  . Incomplete bladder emptying 11/22/2014  . GERD (gastroesophageal reflux disease) 05/09/2008    Past Surgical History:  Procedure Laterality Date  . GALLBLADDER SURGERY    . PORTA CATH INSERTION N/A 06/07/2018   Procedure: PORTA CATH INSERTION;  Surgeon: Katha Cabal, MD;  Location: [place] [facility];  Service: Cardiovascular;  Laterality: N/A;  . PROSTATE SURGERY    . STOMACH SURGERY      Prior to Admission medications   Medication Sig Start Date End Date Taking? Authorizing Provider  aspirin EC 81 MG tablet Take 81 mg by mouth daily.     [provider]  Calcium Carb-Cholecalciferol (CALCIUM-VITAMIN D) 500-200 MG-UNIT tablet Take 1 tablet by mouth daily. 12/30/17   Earlie Server, MD  dexamethasone (DECADRON) 4 MG tablet Take 1 tablet (4 mg total) by mouth daily. 06/03/18   Earlie Server, MD  famotidine (PEPCID) 20 MG tablet Take 1 tablet (20 mg total) by mouth daily. 07/04/18 07/04/19  Schuyler Amor, MD  fentaNYL (DURAGESIC) 50 MCG/HR Place 10 patches onto the skin every 3 (three) days. 08/17/18   Earlie Server, MD  finasteride (PROSCAR) 5 MG tablet Take 1 tablet (5 mg total) by mouth daily. 06/11/16   [name] A, PA-C  hydrocortisone 2.5 % cream APP EXT AA BID 09/02/17   [provider]  lidocaine-prilocaine (EMLA) cream Apply 1 application topically as needed. 06/17/18   Earlie Server, MD  megestrol (MEGACE) 40 MG tablet Take 2  tablets (80 mg total) by mouth 2 (two) times daily. 08/03/18   Earlie Server, MD  mirabegron ER (MYRBETRIQ) 25 MG TB24 tablet Take 1 tablet (25 mg total) by mouth daily. 05/04/17   [name] A, PA-C  mirtazapine (REMERON) 15 MG tablet Take 15 mg by mouth at bedtime. 03/31/18   [provider]  Multiple Vitamin (MULTIVITAMIN WITH MINERALS) TABS tablet Take 1 tablet by mouth daily. 08/02/18   Vaughan Basta, MD  nortriptyline (PAMELOR) 25 MG capsule Take 25 mg by mouth at bedtime. Reported on 05/16/2015 10/30/14   [provider]  nystatin (MYCOSTATIN) 100000 UNIT/ML suspension Take 5 mLs (500,000 Units total) by mouth 3 (three) times daily. 07/15/18   Earlie Server, MD  omeprazole (PRILOSEC) 20 MG capsule TAKE 1 CAPSULE(20 MG) BY MOUTH DAILY 06/23/18   Earlie Server, MD  ondansetron (ZOFRAN) 8 MG tablet Take 1 tablet (8 mg total) by mouth 2 (two) times daily as needed for refractory nausea / vomiting. 05/12/18   Earlie Server, MD  Oxycodone HCl 10 MG TABS Take 1 tablet (10 mg total) by mouth every 6 (six) hours as needed. 06/17/18   Earlie Server, MD  polyethylene glycol [facility]aveling) packet Take 17 g by mouth daily. Mix one tablespoon with 8oz of your favorite juice or water every day until you are having soft formed stools. Then start taking once daily if you didn't have a stool the day before. 02/26/18   Merlyn Lot, MD  prochlorperazine (COMPAZINE) 10 MG tablet Take 1 tablet (10 mg total) by mouth every 6 (six) hours as needed (Nausea or vomiting). 05/12/18   Earlie Server, MD  pyridOXINE (VITAMIN B-6) 50 MG tablet Take 50 mg by mouth daily.     [provider]  senna-docusate (SENOKOT-S) 8.6-50 MG tablet Take 2 tablets by mouth daily. 04/12/18   Earlie Server, MD  tamsulosin (FLOMAX) 0.4 MG CAPS capsule Take 1 capsule (0.4 mg total) by mouth daily. 07/19/17   [name] A, PA-C  triamcinolone cream (KENALOG) 0.1 % APP EXT AA BID 05/09/18   [provider]    Allergies Patient has no known allergies.  Family History  Problem Relation Age of Onset  . Hypertension Mother   . Stroke Father   . Breast cancer Niece        dx  under 73  . Breast cancer Daughter 33  . Prostate cancer Son 16  . Kidney disease Neg Hx   . Kidney cancer Neg Hx   . Bladder Cancer Neg Hx     Social History Social History   Tobacco Use  . Smoking status: Former Smoker    Last attempt to quit: 06/01/1998    Years since quitting: 20.2  . Smokeless tobacco: Never Used  Substance Use Topics  . Alcohol use: No     Alcohol/week: 0.0 standard drinks  . Drug use: Not Currently    Frequency: 2.0 times per week    Types: Marijuana    Comment: marijuana two time monthly gives him an appetite    Review of Systems Constitutional: No fever/chills Eyes: No visual changes. ENT: No sore throat. No stiff neck no neck pain Cardiovascular: Denies chest pain. Respiratory: + shortness of breath. Gastrointestinal:   no vomiting.  No diarrhea.  No constipation. Genitourinary: Negative for dysuria. Musculoskeletal: Negative lower extremity swelling Skin: Negative for rash. Neurological: Negative for severe headaches, focal weakness or numbness.   ____________________________________________   PHYSICAL EXAM:  VITAL SIGNS: ED  Triage Vitals  Enc Vitals Group     BP 08/09/18 1737 110/60     Pulse Rate 08/09/18 1734 73     Resp 08/09/18 1734 (!) 30     Temp 08/09/18 1734 97.7 F (36.5 C)     Temp Source 08/09/18 1734 Oral     SpO2 08/09/18 1734 97 %     Weight 08/09/18 1736 165 lb (74.8 kg)     Height 08/09/18 1736 5\' 9"  (1.753 m)     Head Circumference --      Peak Flow --      Pain Score 08/09/18 1736 0     Pain Loc --      Pain Edu? --      Excl. in [place]? --     Constitutional: Alert and oriented. Well appearing and in no acute distress. Eyes: Conjunctivae are normal Head: Atraumatic HEENT: No congestion/rhinnorhea. Mucous membranes are moist.  Oropharynx non-erythematous Neck:   Nontender with no meningismus, no masses, no stridor Cardiovascular: Normal rate, regular rhythm. Grossly normal heart sounds.  Good peripheral circulation. Respiratory: Respiratory rate elevated, but lungs are clear. Abdominal: Soft and nontender. No distention. No guarding no rebound Back:  There is no focal tenderness or step off.  there is no midline tenderness there are no lesions noted. there is no CVA tenderness Musculoskeletal: No lower extremity tenderness, no upper extremity tenderness. No joint effusions, no  DVT signs strong distal pulses no edema Neurologic:  Normal speech and language. No gross focal neurologic deficits are appreciated.  Skin:  Skin is warm, dry and intact. No rash noted. Psychiatric: Mood and affect are normal. Speech and behavior are normal.  ____________________________________________   LABS (all labs ordered are listed, but only abnormal results are displayed)  Labs Reviewed  BASIC METABOLIC PANEL - Abnormal; Notable for the following components:      Result Value   Sodium 133 (*)    CO2 19 (*)    GFR calc non Af Amer 54 (*)    All other components within normal limits  CBC WITH DIFFERENTIAL/PLATELET - Abnormal; Notable for the following components:   RBC 3.83 (*)    Hemoglobin 10.6 (*)    HCT 32.0 (*)    RDW 16.1 (*)    Abs Immature Granulocytes 0.09 (*)    All other components within normal limits  TROPONIN I  BRAIN NATRIURETIC PEPTIDE  PROTIME-INR  APTT    Pertinent labs  results that were available during my care of the patient were reviewed by me and considered in my medical decision making (see chart for details). ____________________________________________  EKG  I personally interpreted any EKGs ordered by me or triage Sinus rhythm rate 74 bpm normal axis no acute ST elevation ossific ST changes ____________________________________________  RADIOLOGY  Pertinent labs & imaging results that were available during my care of the patient were reviewed by me and considered in my medical decision making (see chart for details). If possible, patient and/or family made aware of any abnormal findings.  Dg Chest 2 View  Result Date: 08/09/2018 CLINICAL DATA:  Shortness of breath EXAM: CHEST - 2 VIEW COMPARISON:  Chest radiograph 08/03/2018, chest CT 07/29/2018 FINDINGS: Normal heart size. Right chest wall Port-A-Cath tip is in the right atrium. Areas of mild opacity within the left lung are unchanged. No new area of consolidation. No pleural effusion or  pulmonary edema. Gas-filled colon interposed anterior to the liver beneath right hemidiaphragm. Multifocal osseous metastatic disease. IMPRESSION: Unchanged  left mid lung opacities. Electronically Signed   By: Ulyses Jarred M.D.   On: 08/09/2018 18:20   Ct Angio Chest Pe W And/or Wo Contrast  Result Date: 08/09/2018 CLINICAL DATA:  85 year old male with acute shortness of breath. History of prostate cancer. EXAM: CT ANGIOGRAPHY CHEST WITH CONTRAST TECHNIQUE: Multidetector CT imaging of the chest was performed using the standard protocol during bolus administration of intravenous contrast. Multiplanar CT image reconstructions and MIPs were obtained to evaluate the vascular anatomy. CONTRAST:  51mL OMNIPAQUE IOHEXOL 350 MG/ML SOLN COMPARISON:  None. FINDINGS: Cardiovascular: This is a technically satisfactory study. A subsegmental pulmonary embolus within the RIGHT middle lobe (series 5: Images 163-168) and within the RIGHT LOWER lobe (5:143-149) noted. Mild cardiomegaly noted without CT evidence of RIGHT heart strain. Coronary artery atherosclerotic calcifications noted. No evidence of thoracic aortic aneurysm or pericardial effusion. Mediastinum/Nodes: No enlarged mediastinal, hilar, or axillary lymph nodes. Thyroid gland, trachea, and esophagus demonstrate no significant findings. Lungs/Pleura: Unchanged linear ground-glass opacity within the LEFT UPPER lobe noted. No airspace disease, consolidation, mass, pleural effusion or pneumothorax. Upper Abdomen: No acute abnormality Musculoskeletal: Sclerotic lesions within the visualized bones again noted compatible with metastatic disease. No new or acute bony abnormalities are identified. Review of the MIP images confirms the above findings. IMPRESSION: 1. Subsegmental pulmonary embolus within the RIGHT middle lobe and within the RIGHT LOWER lobe. 2. Diffuse sclerotic bony metastases again noted. 3. Unchanged linear ground-glass opacity within the LEFT UPPER lobe,  nonspecific and unchanged from 07/29/2018. 4. Mild cardiomegaly and coronary artery disease. Critical Value/emergent results were called by telephone at the time of interpretation on 08/09/2018 at 7:31 pm to Dr. Charlotte Crumb , who verbally acknowledged these results. Electronically Signed   By: Margarette Canada M.D.   On: 08/09/2018 19:31   ____________________________________________    PROCEDURES  Procedure(s) performed: None  Procedures  Critical Care performed: CRITICAL CARE Performed by: Schuyler Amor   Total critical care time: 40 minutes  Critical care time was exclusive of separately billable procedures and treating other patients.  Critical care was necessary to treat or prevent imminent or life-threatening deterioration.  Critical care was time spent personally by me on the following activities: development of treatment plan with patient and/or surrogate as well as nursing, discussions with consultants, evaluation of patient's response to treatment, examination of patient, obtaining history from patient or surrogate, ordering and performing treatments and interventions, ordering and review of laboratory studies, ordering and review of radiographic studies, pulse oximetry and re-evaluation of patient's condition.   ____________________________________________   INITIAL IMPRESSION / ASSESSMENT AND PLAN / ED COURSE  Pertinent labs & imaging results that were available during my care of the patient were reviewed by me and considered in my medical decision making (see chart for details).  Patient in no acute distress with dyspnea and tachypnea for weeks by the family's reckoning.  CT scan therefore was obtained to rule out PE which is actually present.  I did personally discuss with the radiologist I did personally look at the CT scan myself.  Patient is in no acute distress, we have given him albuterol which actually seems to be helping him somewhat.  He does not appear to need  BiPAP he appears comfortable even though his respiratory rate is still somewhat elevated.  He has no recent history of GI bleeding or brain bleed, we will start him on heparin and admit him.    ____________________________________________   FINAL CLINICAL IMPRESSION(S) / ED DIAGNOSES  Final diagnoses:  PE (physical exam), annual  Dyspnea, unspecified type      This chart was dictated using voice recognition software.  Despite best efforts to proofread,  errors can occur which can change meaning.  '    Ziya Coonrod, Gerda Diss, MD 08/09/18 1942    Schuyler Amor, MD 08/09/18 [phone]

## 2018-08-09 NOTE — H&P (Addendum)
[facility] [place] NAME: Matthew Hunter    MR#:  [id number]  DATE OF BIRTH:  [birth year]  DATE OF ADMISSION:  08/09/2018  PRIMARY CARE PHYSICIAN: Maryland Pink, MD   REQUESTING/REFERRING PHYSICIAN:   CHIEF COMPLAINT:   Chief Complaint  Patient presents with  . Shortness of Breath    HISTORY OF PRESENT ILLNESS: Matthew Hunter  is a 85 y.o. male with a known history of coronary disease, metastatic prostate cancer, GERD, hypertension presented to the emergency room for shortness of breath.  Patient also had some tachycardia.  No fever or chills.  He was evaluated with CTA chest in the emergency room which showed pulmonary volume in the right lung.  Patient started on IV heparin drip for anticoagulation.  Hospitalist service was consulted for further care.  PAST MEDICAL HISTORY:   Past Medical History:  Diagnosis Date  . Arthritis   . Benign enlargement of prostate   . CAD (coronary artery disease)   . Constipation   . Elevated PSA   . Family history of breast cancer   . Family history of prostate cancer   . GERD (gastroesophageal reflux disease)   . Hypertension   . Incomplete bladder emptying   . Nocturia   . Prostate cancer ([place]) 05/03/2017   Rad tx's and Lupron shots.   . Urgency of micturation   . Urinary frequency     PAST SURGICAL HISTORY:  Past Surgical History:  Procedure Laterality Date  . GALLBLADDER SURGERY    . PORTA CATH INSERTION N/A 06/07/2018   Procedure: PORTA CATH INSERTION;  Surgeon: Katha Cabal, MD;  Location: [facility];  Service: Cardiovascular;  Laterality: N/A;  . PROSTATE SURGERY    . STOMACH SURGERY      SOCIAL HISTORY:  Social History   Tobacco Use  . Smoking status: Former Smoker    Last attempt to quit: 06/01/1998    Years since quitting: 20.2  . Smokeless tobacco: Never Used  Substance Use Topics  . Alcohol use: No    Alcohol/week: 0.0 standard drinks    FAMILY  HISTORY:  Family History  Problem Relation Age of Onset  . Hypertension Mother   . Stroke Father   . Breast cancer Niece        dx  under 27  . Breast cancer Daughter 21  . Prostate cancer Son 31  . Kidney disease Neg Hx   . Kidney cancer Neg Hx   . Bladder Cancer Neg Hx     DRUG ALLERGIES: No Known Allergies  REVIEW OF SYSTEMS:   CONSTITUTIONAL: No fever, fatigue or weakness.  EYES: No blurred or double vision.  EARS, NOSE, AND THROAT: No tinnitus or ear pain.  RESPIRATORY: No cough, has shortness of breath, no wheezing or hemoptysis.  CARDIOVASCULAR: No chest pain, orthopnea, edema.  GASTROINTESTINAL: No nausea, vomiting, diarrhea or abdominal pain.  GENITOURINARY: No dysuria, hematuria.  ENDOCRINE: No polyuria, nocturia,  HEMATOLOGY: No anemia, easy bruising or bleeding SKIN: No rash or lesion. MUSCULOSKELETAL: No joint pain or arthritis.   NEUROLOGIC: No tingling, numbness, weakness.  PSYCHIATRY: No anxiety or depression.   MEDICATIONS AT HOME:  Prior to Admission medications   Medication Sig Start Date End Date Taking? Authorizing Provider  aspirin EC 81 MG tablet Take 81 mg by mouth daily.    Yes [provider]  Calcium Carb-Cholecalciferol (CALCIUM-VITAMIN D) 500-200 MG-UNIT tablet Take 1 tablet by mouth daily. 12/30/17  Yes Earlie Server, MD  fentaNYL (DURAGESIC) 50 MCG/HR Place 10 patches onto the skin every 3 (three) days. 08/17/18  Yes Earlie Server, MD  finasteride (PROSCAR) 5 MG tablet Take 1 tablet (5 mg total) by mouth daily. 06/11/16  Yes McGowan, Larene [place] A, PA-C  hydrocortisone 2.5 % cream APP EXT AA BID 09/02/17  Yes [provider]  megestrol (MEGACE) 40 MG tablet Take 2 tablets (80 mg total) by mouth 2 (two) times daily. 08/03/18  Yes Earlie Server, MD  mirtazapine (REMERON) 15 MG tablet Take 15 mg by mouth at bedtime. 03/31/18  Yes [provider]  Multiple Vitamin (MULTIVITAMIN WITH MINERALS) TABS tablet Take 1 tablet by mouth daily. 08/02/18  Yes  Vaughan Basta, MD  nortriptyline (PAMELOR) 25 MG capsule Take 25 mg by mouth at bedtime. Reported on 05/16/2015 10/30/14  Yes [provider]  omeprazole (PRILOSEC) 20 MG capsule TAKE 1 CAPSULE(20 MG) BY MOUTH DAILY 06/23/18  Yes Earlie Server, MD  ondansetron (ZOFRAN) 8 MG tablet Take 1 tablet (8 mg total) by mouth 2 (two) times daily as needed for refractory nausea / vomiting. 05/12/18  Yes Earlie Server, MD  Oxycodone HCl 10 MG TABS Take 1 tablet (10 mg total) by mouth every 6 (six) hours as needed. 06/17/18  Yes Earlie Server, MD  polyethylene glycol [facility] / Floria Raveling) packet Take 17 g by mouth daily. Mix one tablespoon with 8oz of your favorite juice or water every day until you are having soft formed stools. Then start taking once daily if you didn't have a stool the day before. 02/26/18  Yes Merlyn Lot, MD  prochlorperazine (COMPAZINE) 10 MG tablet Take 1 tablet (10 mg total) by mouth every 6 (six) hours as needed (Nausea or vomiting). 05/12/18  Yes Earlie Server, MD  pyridOXINE (VITAMIN B-6) 50 MG tablet Take 50 mg by mouth daily.    Yes [provider]  senna-docusate (SENOKOT-S) 8.6-50 MG tablet Take 2 tablets by mouth daily. 04/12/18  Yes Earlie Server, MD      PHYSICAL EXAMINATION:   VITAL SIGNS: Blood pressure 117/70, pulse 87, temperature 97.7 F (36.5 C), temperature source Oral, resp. rate (!) 21, height 5\' 9"  (1.753 m), weight 74.8 kg, SpO2 98 %.  GENERAL:  85 y.o.-year-old patient lying in the bed with no acute distress.  EYES: Pupils equal, round, reactive to light and accommodation. No scleral icterus. Extraocular muscles intact.  HEENT: Head atraumatic, normocephalic. Oropharynx and nasopharynx clear.  NECK:  Supple, no jugular venous distention. No thyroid enlargement, no tenderness.  LUNGS: Decreased breath sounds bilaterally, no wheezing, rales,rhonchi or crepitation. No use of accessory muscles of respiration.  CARDIOVASCULAR: S1, S2 normal. No murmurs, rubs, or  gallops.  ABDOMEN: Soft, nontender, nondistended. Bowel sounds present. No organomegaly or mass.  EXTREMITIES: No pedal edema, cyanosis, or clubbing.  NEUROLOGIC: Cranial nerves II through XII are intact. Muscle strength 5/5 in all extremities. Sensation intact. Gait not checked.  PSYCHIATRIC: The patient is alert and oriented x 3.  SKIN: No obvious rash, lesion, or ulcer.   LABORATORY PANEL:   CBC Recent Labs  Lab 08/03/18 0823 08/09/18 1744  WBC 8.7 9.7  HGB 8.3* 10.6*  HCT 24.3* 32.0*  PLT 173 176  MCV 81.8 83.6  MCH 27.9 27.7  MCHC 34.2 33.1  RDW 15.7* 16.1*  LYMPHSABS 1.0 1.8  MONOABS 0.6 0.9  EOSABS 0.0 0.3  BASOSABS 0.0 0.0   ------------------------------------------------------------------------------------------------------------------  Chemistries  Recent Labs  Lab 08/03/18 0823 08/09/18 1744  NA 134* 133*  K 3.7 4.4  CL 110 102  CO2 17* 19*  GLUCOSE 105* 94  BUN 16 20  CREATININE 0.80 1.20  CALCIUM 8.7* 9.7  AST 33  --   ALT 26  --   ALKPHOS 31*  --   BILITOT 0.5  --    ------------------------------------------------------------------------------------------------------------------ estimated creatinine clearance is 44.2 mL/min (by C-G formula based on SCr of 1.2 mg/dL). ------------------------------------------------------------------------------------------------------------------ No results for input(s): TSH, T4TOTAL, T3FREE, THYROIDAB in the last 72 hours.  Invalid input(s): FREET3   Coagulation profile Recent Labs  Lab 08/09/18 1931  INR 1.1   ------------------------------------------------------------------------------------------------------------------- No results for input(s): DDIMER in the last 72 hours. -------------------------------------------------------------------------------------------------------------------  Cardiac Enzymes Recent Labs  Lab 08/09/18 1744  TROPONINI <0.03    ------------------------------------------------------------------------------------------------------------------ Invalid input(s): POCBNP  ---------------------------------------------------------------------------------------------------------------  Urinalysis    Component Value Date/Time   COLORURINE YELLOW (A) 07/22/2018 1129   APPEARANCEUR CLEAR (A) 07/22/2018 1129   LABSPEC 1.013 07/22/2018 1129   PHURINE 5.0 07/22/2018 1129   GLUCOSEU NEGATIVE 07/22/2018 1129   HGBUR NEGATIVE 07/22/2018 1129   BILIRUBINUR NEGATIVE 07/22/2018 1129   KETONESUR NEGATIVE 07/22/2018 1129   PROTEINUR NEGATIVE 07/22/2018 1129   NITRITE NEGATIVE 07/22/2018 1129   LEUKOCYTESUR NEGATIVE 07/22/2018 1129     RADIOLOGY: Dg Chest 2 View  Result Date: 08/09/2018 CLINICAL DATA:  Shortness of breath EXAM: CHEST - 2 VIEW COMPARISON:  Chest radiograph 08/03/2018, chest CT 07/29/2018 FINDINGS: Normal heart size. Right chest wall Port-A-Cath tip is in the right atrium. Areas of mild opacity within the left lung are unchanged. No new area of consolidation. No pleural effusion or pulmonary edema. Gas-filled colon interposed anterior to the liver beneath right hemidiaphragm. Multifocal osseous metastatic disease. IMPRESSION: Unchanged left mid lung opacities. Electronically Signed   By: Ulyses Jarred M.D.   On: 08/09/2018 18:20   Ct Angio Chest Pe W And/or Wo Contrast  Result Date: 08/09/2018 CLINICAL DATA:  85 year old male with acute shortness of breath. History of prostate cancer. EXAM: CT ANGIOGRAPHY CHEST WITH CONTRAST TECHNIQUE: Multidetector CT imaging of the chest was performed using the standard protocol during bolus administration of intravenous contrast. Multiplanar CT image reconstructions and MIPs were obtained to evaluate the vascular anatomy. CONTRAST:  63mL OMNIPAQUE IOHEXOL 350 MG/ML SOLN COMPARISON:  None. FINDINGS: Cardiovascular: This is a technically satisfactory study. A subsegmental pulmonary  embolus within the RIGHT middle lobe (series 5: Images 163-168) and within the RIGHT LOWER lobe (5:143-149) noted. Mild cardiomegaly noted without CT evidence of RIGHT heart strain. Coronary artery atherosclerotic calcifications noted. No evidence of thoracic aortic aneurysm or pericardial effusion. Mediastinum/Nodes: No enlarged mediastinal, hilar, or axillary lymph nodes. Thyroid gland, trachea, and esophagus demonstrate no significant findings. Lungs/Pleura: Unchanged linear ground-glass opacity within the LEFT UPPER lobe noted. No airspace disease, consolidation, mass, pleural effusion or pneumothorax. Upper Abdomen: No acute abnormality Musculoskeletal: Sclerotic lesions within the visualized bones again noted compatible with metastatic disease. No new or acute bony abnormalities are identified. Review of the MIP images confirms the above findings. IMPRESSION: 1. Subsegmental pulmonary embolus within the RIGHT middle lobe and within the RIGHT LOWER lobe. 2. Diffuse sclerotic bony metastases again noted. 3. Unchanged linear ground-glass opacity within the LEFT UPPER lobe, nonspecific and unchanged from 07/29/2018. 4. Mild cardiomegaly and coronary artery disease. Critical Value/emergent results were called by telephone at the time of interpretation on 08/09/2018 at 7:31 pm to Dr. Charlotte Crumb , who verbally acknowledged these results. Electronically Signed   By: Margarette Canada  M.D.   On: 08/09/2018 19:31    EKG: Orders placed or performed during the hospital encounter of 08/09/18  . ED EKG  . ED EKG    IMPRESSION AND PLAN:  85 year old male patient with a known history of coronary disease, metastatic prostate cancer, GERD, hypertension presented to the emergency room for shortness of breath.  -Acute right lung pulmonary embolism Admit patient to medical floor IV heparin drip for anticoagulation Venous Doppler ultrasound of lower extremities to assess for DVT  -Metastatic prostate cancer Oncology  follow-up Supportive care  -GERD Continue proton pump inhibitor  -DVT prophylaxis on anticoagulation with heparin drip All the records are reviewed and case discussed with ED provider. Management plans discussed with the patient, family and they are in agreement.  CODE STATUS: Partial code Code Status History    Date Active Date Inactive Code Status Order ID Comments User Context   07/29/2018 1820 08/01/2018 1516 Partial Code [id number]  Dustin Flock, MD Inpatient    Questions for Most Recent Historical Code Status (Order [id number])    Question Answer Comment   In the event of cardiac or respiratory ARREST: Initiate Code Blue, Call Rapid Response Yes    In the event of cardiac or respiratory ARREST: Perform CPR Yes    In the event of cardiac or respiratory ARREST: Perform Intubation/Mechanical Ventilation No    In the event of cardiac or respiratory ARREST: Use NIPPV/BiPAp only if indicated Yes    In the event of cardiac or respiratory ARREST: Administer ACLS medications if indicated Yes    In the event of cardiac or respiratory ARREST: Perform Defibrillation or Cardioversion if indicated Yes        TOTAL TIME TAKING CARE OF THIS PATIENT: 53 minutes.    Saundra Shelling M.D on 08/09/2018 at 8:44 PM  Between 7am to 6pm - Pager - [phone]  After 6pm go to [email or web address] - password [email or web address] [email or web address]  [facility]  Office  [phone]  CC: Primary care physician; Maryland Pink, MD

## 2018-08-09 NOTE — ED Triage Notes (Signed)
Here for [facility]. Discharged last Monday after being admitted for PNA.  Pt labored in triage.  No longer on abx.  Pt is cancer pt.  VSS at this time other than pt has noted tachypnea.  Thick productive sputum per pt.  Chemo has been held because pt has been too sick for it.

## 2018-08-09 NOTE — Consult Note (Signed)
ANTICOAGULATION CONSULT NOTE - Initial Consult  Pharmacy Consult for Heparin Indication: pulmonary embolus  No Known Allergies  Patient Measurements: Height: 5\' 9"  (175.3 cm) Weight: 165 lb (74.8 kg) IBW/kg (Calculated) : 70.7 Heparin Dosing Weight: 74.8 kg  Vital Signs: Temp: 97.7 F (36.5 C) (03/10 1734) Temp Source: Oral (03/10 1734) BP: 110/60 (03/10 1737) Pulse Rate: 73 (03/10 1734)  Labs: Recent Labs    08/09/18 1744  HGB 10.6*  HCT 32.0*  PLT 176  CREATININE 1.20  TROPONINI <0.03    Estimated Creatinine Clearance: 44.2 mL/min (by C-G formula based on SCr of 1.2 mg/dL).   Medical History: Past Medical History:  Diagnosis Date  . Arthritis   . Benign enlargement of prostate   . CAD (coronary artery disease)   . Constipation   . Elevated PSA   . Family history of breast cancer   . Family history of prostate cancer   . GERD (gastroesophageal reflux disease)   . Hypertension   . Incomplete bladder emptying   . Nocturia   . Prostate cancer ([place]) 05/03/2017   Rad tx's and Lupron shots.   . Urgency of micturation   . Urinary frequency     Medications:  Patient not on any PTA anticoagulants.  Assessment: 85 yo male presenting with PE. PMH of metastatic prostate cancer.  Goal of Therapy:  Heparin level 0.3-0.7 units/ml Monitor platelets by anticoagulation protocol: Yes   Plan:  Labs ordered. Will order heparin bolus of 3800 units, followed by continuous infusion of 1200 units/hr. Will order heparin level for 8 hours after initiation. Will order CBC with AM labs. Pharmacy will continue to monitor.   Paticia Stack, PharmD Pharmacy Resident  08/09/2018 7:45 PM

## 2018-08-10 ENCOUNTER — Other Ambulatory Visit: Payer: Self-pay

## 2018-08-10 ENCOUNTER — Inpatient Hospital Stay: Payer: Medicare Other

## 2018-08-10 ENCOUNTER — Telehealth: Payer: Self-pay | Admitting: Urology

## 2018-08-10 DIAGNOSIS — R0600 Dyspnea, unspecified: Secondary | ICD-10-CM

## 2018-08-10 DIAGNOSIS — I82463 Acute embolism and thrombosis of calf muscular vein, bilateral: Secondary | ICD-10-CM

## 2018-08-10 DIAGNOSIS — C7951 Secondary malignant neoplasm of bone: Secondary | ICD-10-CM

## 2018-08-10 DIAGNOSIS — C61 Malignant neoplasm of prostate: Secondary | ICD-10-CM

## 2018-08-10 DIAGNOSIS — I2699 Other pulmonary embolism without acute cor pulmonale: Principal | ICD-10-CM

## 2018-08-10 LAB — BASIC METABOLIC PANEL
ANION GAP: 10 (ref 5–15)
BUN: 19 mg/dL (ref 8–23)
CALCIUM: 8.9 mg/dL (ref 8.9–10.3)
CO2: 21 mmol/L — ABNORMAL LOW (ref 22–32)
Chloride: 103 mmol/L (ref 98–111)
Creatinine, Ser: 1.2 mg/dL (ref 0.61–1.24)
GFR calc Af Amer: >60 mL/min (ref >60)
GFR calc non Af Amer: 54 mL/min — ABNORMAL LOW (ref >60)
Glucose, Bld: 84 mg/dL (ref 70–99)
Potassium: 4.1 mmol/L (ref 3.5–5.1)
Sodium: 134 mmol/L — ABNORMAL LOW (ref 135–145)

## 2018-08-10 LAB — CBC
HEMATOCRIT: 29.3 % — AB (ref 39.0–52.0)
Hemoglobin: 9.8 g/dL — ABNORMAL LOW (ref 13.0–17.0)
MCH: 27.5 pg (ref 26.0–34.0)
MCHC: 33.4 g/dL (ref 30.0–36.0)
MCV: 82.1 fL (ref 80.0–100.0)
Platelets: 169 10*3/uL (ref 150–400)
RBC: 3.57 MIL/uL — ABNORMAL LOW (ref 4.22–5.81)
RDW: 16.4 % — ABNORMAL HIGH (ref 11.5–15.5)
WBC: 7.8 10*3/uL (ref 4.0–10.5)
nRBC: 0 % (ref 0.0–0.2)

## 2018-08-10 LAB — HEPARIN LEVEL (UNFRACTIONATED)
Heparin Unfractionated: 1.1 IU/mL — ABNORMAL HIGH (ref 0.30–0.70)
Heparin Unfractionated: 0.81 IU/mL — ABNORMAL HIGH (ref 0.30–0.70)

## 2018-08-10 MED ORDER — SODIUM CHLORIDE 0.9% FLUSH: 10.0000 mL | Freq: Two times a day (BID) | INTRAVENOUS | Status: DC

## 2018-08-10 MED ORDER — OXYCODONE HCL 5 MG PO TABS: 10.0000 mg | ORAL_TABLET | Freq: Four times a day (QID) | ORAL | Status: DC | PRN

## 2018-08-10 MED ORDER — HEPARIN (PORCINE) 25000 UT/250ML-% IV SOLN
900.0000 [IU]/h | INTRAVENOUS | Status: DC
  Administered 2018-08-10: 08:00:00 1050 [IU]/h via INTRAVENOUS
  Filled 2018-08-10: qty 250

## 2018-08-10 MED ORDER — SODIUM CHLORIDE 0.9% FLUSH: 10.0000 mL | INTRAVENOUS | Status: DC | PRN

## 2018-08-10 NOTE — Telephone Encounter (Signed)
LUPRON HAS BEEN APPROVED PA [id number] 08-02-18 THRU 08-02-19 08-10-18 MICHELLE

## 2018-08-10 NOTE — Progress Notes (Signed)
[place] [facility] [place] NAME: Matthew Hunter    MR#:  [id number]  DATE OF BIRTH:  09-Apr-1930  SUBJECTIVE:  CHIEF COMPLAINT:   Chief Complaint  Patient presents with   Shortness of Breath   Better shortness of breath. REVIEW OF SYSTEMS:  Review of Systems  Constitutional: Negative for chills, fever and malaise/fatigue.  HENT: Negative for sore throat.   Eyes: Negative for blurred vision and double vision.  Respiratory: Positive for shortness of breath. Negative for cough, hemoptysis, wheezing and stridor.   Cardiovascular: Negative for chest pain, palpitations, orthopnea and leg swelling.  Gastrointestinal: Negative for abdominal pain, blood in stool, diarrhea, melena, nausea and vomiting.  Genitourinary: Negative for dysuria, flank pain and hematuria.  Musculoskeletal: Negative for back pain and joint pain.  Skin: Negative for rash.  Neurological: Negative for dizziness, sensory change, focal weakness, seizures, loss of consciousness, weakness and headaches.  Endo/Heme/Allergies: Negative for polydipsia.  Psychiatric/Behavioral: Negative for depression. The patient is not nervous/anxious.     DRUG ALLERGIES:  No Known Allergies VITALS:  Blood pressure 116/64, pulse 79, temperature 98.3 F (36.8 C), temperature source Oral, resp. rate 17, height 5\' 9"  (1.753 m), weight 69.8 kg, SpO2 99 %. PHYSICAL EXAMINATION:  Physical Exam Constitutional:      General: He is not in acute distress.    Appearance: Normal appearance.  HENT:     Head: Normocephalic.     Mouth/Throat:     Mouth: Mucous membranes are moist.  Eyes:     General: No scleral icterus.    Conjunctiva/sclera: Conjunctivae normal.     Pupils: Pupils are equal, round, and reactive to light.  Neck:     Musculoskeletal: Normal range of motion and neck supple.     Vascular: No JVD.     Trachea: No tracheal deviation.  Cardiovascular:     Rate and Rhythm: Normal rate and  regular rhythm.     Heart sounds: Normal heart sounds. No murmur. No gallop.   Pulmonary:     Effort: Pulmonary effort is normal. No respiratory distress.     Breath sounds: Normal breath sounds. No wheezing or rales.  Abdominal:     General: Bowel sounds are normal. There is no distension.     Palpations: Abdomen is soft.     Tenderness: There is no abdominal tenderness. There is no rebound.  Musculoskeletal: Normal range of motion.        General: No swelling or tenderness.     Right lower leg: No edema.     Left lower leg: No edema.  Skin:    Findings: No erythema or rash.  Neurological:     General: No focal deficit present.     Mental Status: He is alert and oriented to person, place, and time.     Cranial Nerves: No cranial nerve deficit.  Psychiatric:        Mood and Affect: Mood normal.    LABORATORY PANEL:  Male CBC Recent Labs  Lab 08/10/18 0429  WBC 7.8  HGB 9.8*  HCT 29.3*  PLT 169   ------------------------------------------------------------------------------------------------------------------ Chemistries  Recent Labs  Lab 08/10/18 0429  NA 134*  K 4.1  CL 103  CO2 21*  GLUCOSE 84  BUN 19  CREATININE 1.20  CALCIUM 8.9   RADIOLOGY:  Dg Chest 2 View  Result Date: 08/09/2018 CLINICAL DATA:  Shortness of breath EXAM: CHEST - 2 VIEW COMPARISON:  Chest radiograph 08/03/2018, chest CT  07/29/2018 FINDINGS: Normal heart size. Right chest wall Port-A-Cath tip is in the right atrium. Areas of mild opacity within the left lung are unchanged. No new area of consolidation. No pleural effusion or pulmonary edema. Gas-filled colon interposed anterior to the liver beneath right hemidiaphragm. Multifocal osseous metastatic disease. IMPRESSION: Unchanged left mid lung opacities. Electronically Signed   By: Ulyses Jarred M.D.   On: 08/09/2018 18:20   Ct Angio Chest Pe W And/or Wo Contrast  Addendum Date: 08/10/2018   ADDENDUM REPORT: 08/10/2018 12:34 ADDENDUM: The  linear opacity throughout the LEFT UPPER lobe now has the appearance compatible with radiation changes. The other ground-glass opacities within the lungs have resolved since 07/29/2018. There are no findings suspicious for pneumonia. Electronically Signed   By: Margarette Canada M.D.   On: 08/10/2018 12:34   Result Date: 08/10/2018 CLINICAL DATA:  85 year old male with acute shortness of breath. History of prostate cancer. EXAM: CT ANGIOGRAPHY CHEST WITH CONTRAST TECHNIQUE: Multidetector CT imaging of the chest was performed using the standard protocol during bolus administration of intravenous contrast. Multiplanar CT image reconstructions and MIPs were obtained to evaluate the vascular anatomy. CONTRAST:  36mL OMNIPAQUE IOHEXOL 350 MG/ML SOLN COMPARISON:  None. FINDINGS: Cardiovascular: This is a technically satisfactory study. A subsegmental pulmonary embolus within the RIGHT middle lobe (series 5: Images 163-168) and within the RIGHT LOWER lobe (5:143-149) noted. Mild cardiomegaly noted without CT evidence of RIGHT heart strain. Coronary artery atherosclerotic calcifications noted. No evidence of thoracic aortic aneurysm or pericardial effusion. Mediastinum/Nodes: No enlarged mediastinal, hilar, or axillary lymph nodes. Thyroid gland, trachea, and esophagus demonstrate no significant findings. Lungs/Pleura: Unchanged linear ground-glass opacity within the LEFT UPPER lobe noted. No airspace disease, consolidation, mass, pleural effusion or pneumothorax. Upper Abdomen: No acute abnormality Musculoskeletal: Sclerotic lesions within the visualized bones again noted compatible with metastatic disease. No new or acute bony abnormalities are identified. Review of the MIP images confirms the above findings. IMPRESSION: 1. Subsegmental pulmonary embolus within the RIGHT middle lobe and within the RIGHT LOWER lobe. 2. Diffuse sclerotic bony metastases again noted. 3. Unchanged linear ground-glass opacity within the LEFT UPPER  lobe, nonspecific and unchanged from 07/29/2018. 4. Mild cardiomegaly and coronary artery disease. Critical Value/emergent results were called by telephone at the time of interpretation on 08/09/2018 at 7:31 pm to Dr. Charlotte Crumb , who verbally acknowledged these results. Electronically Signed: By: Margarette Canada M.D. On: 08/09/2018 19:31   ASSESSMENT AND PLAN:   85 year old male patient with a known history of coronary disease, metastatic prostate cancer, GERD, hypertension presented to the emergency room for shortness of breath.  -Acute right lung pulmonary embolism Continue IV heparin drip for anticoagulation. Oncology consult.  Bilateral leg DVT. [place]: Positive for acute bilateral deep venous thrombosis isolated to the calf veins (posterior tibial veins on the right, and peroneal veins on the left).  -Metastatic prostate cancer Oncology follow-up Supportive care  -GERD Continue proton pump inhibitor  Anemia of chronic disease.  Stable.  All the records are reviewed and case discussed with Care Management/Social Worker. Management plans discussed with the patient, his daughter and they are in agreement.  CODE STATUS: Partial Code  TOTAL TIME TAKING CARE OF THIS PATIENT: 32 minutes.   More than 50% of the time was spent in counseling/coordination of care: YES  POSSIBLE D/C IN 1-2 DAYS, DEPENDING ON CLINICAL CONDITION.   Matthew Hunter M.D on 08/10/2018 at 1:55 PM  Between 7am to 6pm - Pager - [phone]  After 6pm  go to [email or web address] - [facility]

## 2018-08-10 NOTE — Consult Note (Signed)
ANTICOAGULATION CONSULT NOTE - Initial Consult  Pharmacy Consult for Heparin Indication: pulmonary embolus  No Known Allergies  Patient Measurements: Height: 5\' 9"  (175.3 cm) Weight: 153 lb 12.8 oz (69.8 kg) IBW/kg (Calculated) : 70.7 Heparin Dosing Weight: 74.8 kg  Vital Signs: Temp: 98.2 F (36.8 C) (03/11 1457) BP: 95/73 (03/11 1457) Pulse Rate: 83 (03/11 1457)  Labs: Recent Labs    08/09/18 1744 08/09/18 1931 08/10/18 0429 08/10/18 1539  HGB 10.6*  --  9.8*  --   HCT 32.0*  --  29.3*  --   PLT 176  --  169  --   APTT  --  27  --   --   LABPROT  --  14.2  --   --   INR  --  1.1  --   --   HEPARINUNFRC  --   --  1.10* 0.81*  CREATININE 1.20  --  1.20  --   TROPONINI <0.03  --   --   --     Estimated Creatinine Clearance: 43.6 mL/min (by C-G formula based on SCr of 1.2 mg/dL).   Medical History: Past Medical History:  Diagnosis Date  . Arthritis   . Benign enlargement of prostate   . CAD (coronary artery disease)   . Constipation   . Elevated PSA   . Family history of breast cancer   . Family history of prostate cancer   . GERD (gastroesophageal reflux disease)   . Hypertension   . Incomplete bladder emptying   . Nocturia   . Prostate cancer ([place]) 05/03/2017   Rad tx's and Lupron shots.   . Urgency of micturation   . Urinary frequency     Medications:  Patient not on any PTA anticoagulants.  Assessment: 85 yo male presenting with PE. PMH of metastatic prostate cancer.  Goal of Therapy:  Heparin level 0.3-0.7 units/ml Monitor platelets by anticoagulation protocol: Yes   Plan:  3/11 1539 HL 0.81. Supratherapeutic. Will decrease heparin drip to 900 units/hr and recheck HL in 8 hours. CBC with morning labs.  Tawnya Crook, PharmD Pharmacy Resident  08/10/2018 4:29 PM

## 2018-08-10 NOTE — Consult Note (Signed)
Hematology/Oncology Consult note [facility] Telephone:([phone] [phone]  Patient Care Team: [place] Pink, MD as PCP - General (Family Medicine) Earlie Server, MD as Medical Oncologist (Medical Oncology)   Name of the patient: Matthew Hunter  [id number]  [birth year]   Date of visit: 08/10/18 REASON FOR COSULTATION:   History of presenting illness-  85 y.o. male with PMH listed at below who presents to ER for evaluation of shortness of breath. Patient has history of metastatic prostate cancer and follows up with me at the cancer center. He was recently admitted for treatment of multifocal pneumonia and was discharged about 1 week ago. He reports he continues to feel shortness of breath.  Denies any chest pain, leg swelling. Denies any fever, chills. Last chemotherapy was on 07/15/2018.  He received docetaxel. CT chest angiogram showed subsegmental pulmonary embolism within the right middle lobe and within the right lower lobe. Diffuse sclerotic bony metastasis again noted. Unchanged linear groundglass opacity within the left upper lobe nonspecific and unchanged from 07/29/2018. Mild cardiomegaly and coronary artery disease.  Patient was started on IV heparin drip and admitted.  Heme-onc was consulted for further evaluation and management. Review of Systems  Constitutional: Positive for fatigue and unexpected weight change. Negative for appetite change, chills and fever.  HENT:   Negative for hearing loss and voice change.   Eyes: Negative for eye problems and icterus.  Respiratory: Positive for shortness of breath. Negative for chest tightness and cough.   Cardiovascular: Negative for chest pain and leg swelling.  Gastrointestinal: Negative for abdominal distention and abdominal pain.  Endocrine: Negative for hot flashes.  Genitourinary: Negative for difficulty urinating, dysuria and frequency.   Musculoskeletal: Negative for arthralgias.  Skin:  Negative for itching and rash.  Neurological: Negative for light-headedness and numbness.  Hematological: Negative for adenopathy. Does not bruise/bleed easily.  Psychiatric/Behavioral: Negative for confusion.    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 01/20/2018   Goals of care, counseling/discussion 12/17/2017   Prostate cancer ([place]) 05/03/2017   Arthritis 06/11/2016   History of alcohol abuse 06/11/2016   Hx of colonic polyp 06/11/2016   Hypertension 06/11/2016   Internal hemorrhoids 06/11/2016   Elevated PSA 05/16/2015   BPH (benign prostatic hyperplasia) 11/22/2014   Incomplete bladder emptying 11/22/2014   GERD (gastroesophageal reflux disease) 05/09/2008     Past Medical History:  Diagnosis Date   Arthritis    Benign enlargement of prostate    CAD (coronary artery disease)    Constipation    Elevated PSA    Family history of breast cancer    Family history of prostate cancer    GERD (gastroesophageal reflux disease)    Hypertension    Incomplete bladder emptying    Nocturia    Prostate cancer ([place]) 05/03/2017   Rad tx's and Lupron shots.    Urgency of micturation    Urinary frequency      Past Surgical History:  Procedure Laterality Date   GALLBLADDER SURGERY     PORTA CATH INSERTION N/A 06/07/2018   Procedure: PORTA CATH INSERTION;  Surgeon: Katha Cabal, MD;  Location: [facility];  Service: Cardiovascular;  Laterality: N/A;   PROSTATE SURGERY     STOMACH  SURGERY      Social History   Socioeconomic History   Marital status: Married    Spouse name: Not on file   Number of children: Not on file   Years of education: Not on file   Highest education  level: Not on file  Occupational History   Not on file  Social Needs   Financial resource strain: Not on file   Food insecurity:    Worry: Not on file    Inability: Not on file   Transportation needs:    Medical: Not on file    Non-medical: Not on file  Tobacco Use   Smoking status: Former Smoker    Last attempt to quit: 06/01/1998    Years since quitting: 20.2   Smokeless tobacco: Never Used  Substance and Sexual Activity   Alcohol use: No    Alcohol/week: 0.0 standard drinks   Drug use: Not Currently    Frequency: 2.0 times per week    Types: Marijuana    Comment: marijuana two time monthly gives him an appetite   Sexual activity: Not on file  Lifestyle   Physical activity:    Days per week: Not on file    Minutes per session: Not on file   Stress: Not on file  Relationships   Social connections:    Talks on phone: Not on file    Gets together: Not on file    Attends religious service: Not on file    Active member of club or organization: Not on file    Attends meetings of clubs or organizations: Not on file    Relationship status: Not on file   Intimate partner violence:    Fear of current or ex partner: Not on file    Emotionally abused: Not on file    Physically abused: Not on file    Forced sexual activity: Not on file  Other Topics Concern   Not on file  Social History Narrative   Not on file     Family History  Problem Relation Age of Onset   Hypertension Mother    Stroke Father    Breast cancer Niece        dx  under 54   Breast cancer Daughter 72   Prostate cancer Son 74   Kidney disease Neg Hx    Kidney cancer Neg Hx    Bladder Cancer Neg Hx      Current Facility-Administered Medications:    acetaminophen (TYLENOL) tablet 650 mg, 650 mg, Oral, Q6H PRN **OR** acetaminophen (TYLENOL) suppository 650 mg, 650 mg, Rectal, Q6H PRN, Pyreddy, Pavan, MD   calcium-vitamin D (OSCAL WITH D) 500-200 MG-UNIT per tablet 1 tablet, 1  tablet, Oral, Daily, Pyreddy, Pavan, MD, 1 tablet at 08/10/18 1255   finasteride (PROSCAR) tablet 5 mg, 5 mg, Oral, Daily, Pyreddy, Pavan, MD, 5 mg at 08/10/18 1255   heparin ADULT infusion 100 units/mL (25000 units/280mL sodium chloride 0.45%), 1,050 Units/hr, Intravenous, Continuous, Pyreddy, Pavan, MD, Last Rate: 10.5 mL/hr at 08/10/18 0759, 1,050 Units/hr at 08/10/18 0759   megestrol (MEGACE) tablet 80 mg, 80 mg, Oral, BID, Pyreddy, Pavan, MD, 80 mg at 08/10/18 1254   mirtazapine (REMERON) tablet 15 mg, 15 mg, Oral, QHS, Pyreddy, Pavan, MD, 15 mg at 08/10/18 0106   multivitamin with minerals tablet 1 tablet, 1 tablet, Oral, Daily, Pyreddy, Pavan, MD, 1 tablet at 08/10/18 1255   nortriptyline (PAMELOR) capsule 25 mg, 25 mg, Oral, QHS, Pyreddy, Pavan, MD, 25 mg at  08/10/18 0107   ondansetron (ZOFRAN) tablet 8 mg, 8 mg, Oral, BID PRN, Pyreddy, Pavan, MD   oxyCODONE (Oxy IR/ROXICODONE) immediate release tablet 10 mg, 10 mg, Oral, Q6H PRN, Demetrios Loll, MD   pantoprazole (PROTONIX) EC tablet 40 mg, 40 mg, Oral, Daily, Pyreddy, Pavan, MD, 40 mg at 08/10/18 1255   polyethylene glycol (MIRALAX / GLYCOLAX) packet 17 g, 17 g, Oral, Daily, Pyreddy, Pavan, MD   prochlorperazine (COMPAZINE) tablet 10 mg, 10 mg, Oral, Q6H PRN, Pyreddy, Pavan, MD   pyridOXINE (VITAMIN B-6) tablet 50 mg, 50 mg, Oral, Daily, Pyreddy, Pavan, MD, 50 mg at 08/10/18 1255   senna-docusate (Senokot-S) tablet 2 tablet, 2 tablet, Oral, Daily, Pyreddy, Pavan, MD, 2 tablet at 08/10/18 1255   sodium chloride flush (NS) 0.9 % injection 10-40 mL, 10-40 mL, Intracatheter, Q12H, Demetrios Loll, MD   sodium chloride flush (NS) 0.9 % injection 10-40 mL, 10-40 mL, Intracatheter, PRN, Demetrios Loll, MD  Facility-Administered Medications Ordered in Other Encounters:    sodium chloride flush (NS) 0.9 % injection 10 mL, 10 mL, Intravenous, PRN, Earlie Server, MD, 10 mL at 07/15/18 0847   Physical exam:  Vitals:   08/09/18 2201 08/09/18 2215  08/09/18 2243 08/10/18 0348  BP:   (!) 109/58 116/64  Pulse: 78 81 78 79  Resp:   20 17  Temp:   97.7 F (36.5 C) 98.3 F (36.8 C)  TempSrc:   Oral Oral  SpO2: 100% 100% 100% 99%  Weight:   153 lb 12.8 oz (69.8 kg)   Height:   5\' 9"  (1.753 m)    Physical Exam  Constitutional: He is oriented to person, place, and time. No distress.  HENT:  Head: Normocephalic and atraumatic.  Mouth/Throat: No oropharyngeal exudate.  Eyes: Pupils are equal, round, and reactive to light. EOM are normal. No scleral icterus.  Neck: Normal range of motion. Neck supple.  Cardiovascular: Normal rate and regular rhythm.  No murmur heard. Pulmonary/Chest: Effort normal. No respiratory distress.  Decreased breath sound bilaterally.  Abdominal: Soft. He exhibits no distension. There is no abdominal tenderness.  Musculoskeletal: Normal range of motion.        General: No edema.  Neurological: He is alert and oriented to person, place, and time. No cranial nerve deficit.  Skin: Skin is warm and dry. He is not diaphoretic. No erythema.  Psychiatric: Affect normal.        CMP Latest Ref Rng & Units 08/10/2018  Glucose 70 - 99 mg/dL 84  BUN 8 - 23 mg/dL 19  Creatinine 0.61 - 1.24 mg/dL 1.20  Sodium 135 - 145 mmol/L 134(L)  Potassium 3.5 - 5.1 mmol/L 4.1  Chloride 98 - 111 mmol/L 103  CO2 22 - 32 mmol/L 21(L)  Calcium 8.9 - 10.3 mg/dL 8.9  Total Protein 6.5 - 8.1 g/dL -  Total Bilirubin 0.3 - 1.2 mg/dL -  Alkaline Phos 38 - 126 U/L -  AST 15 - 41 U/L -  ALT 0 - 44 U/L -   CBC Latest Ref Rng & Units 08/10/2018  WBC 4.0 - 10.5 K/uL 7.8  Hemoglobin 13.0 - 17.0 g/dL 9.8(L)  Hematocrit 39.0 - 52.0 % 29.3(L)  Platelets 150 - 400 K/uL 169   RADIOGRAPHIC STUDIES: I have personally reviewed the radiological images as listed and agreed with the findings in the report. Dg Chest 2 View  Result Date: 08/09/2018 CLINICAL DATA:  Shortness of breath EXAM: CHEST - 2 VIEW COMPARISON:  Chest radiograph  08/03/2018, chest  CT 07/29/2018 FINDINGS: Normal heart size. Right chest wall Port-A-Cath tip is in the right atrium. Areas of mild opacity within the left lung are unchanged. No new area of consolidation. No pleural effusion or pulmonary edema. Gas-filled colon interposed anterior to the liver beneath right hemidiaphragm. Multifocal osseous metastatic disease. IMPRESSION: Unchanged left mid lung opacities. Electronically Signed   By: Ulyses Jarred M.D.   On: 08/09/2018 18:20   Dg Chest 2 View  Result Date: 08/03/2018 CLINICAL DATA:  Multifocal pneumonia. EXAM: CHEST - 2 VIEW COMPARISON:  Chest x-ray dated 07/22/2018 and chest CTs dated 07/29/2018 and 07/04/2018 FINDINGS: There is slight residual infiltrate in the left midzone. No other discrete pulmonary infiltrates. Multiple sclerotic bone metastases are noted. Multiple old healed left lateral rib fractures. Power port in place with the tip in the right atrium, unchanged. Heart size and vascularity are normal. Tortuosity and calcification of the thoracic aorta. No effusions. IMPRESSION: Minimal residual hazy infiltrate in the left midzone. Unchanged osseous metastases. Aortic Atherosclerosis (ICD10-I70.0). Electronically Signed   By: Lorriane Shire M.D.   On: 08/03/2018 15:28   Dg Chest 2 View  Result Date: 07/22/2018 CLINICAL DATA:  Nasal congestion. EXAM: CHEST - 2 VIEW COMPARISON:  CT 07/04/2018. FINDINGS: PowerPort catheter noted with tip over cavoatrial junction. Heart size normal. No focal alveolar infiltrate. No pleural effusion or pneumothorax. Deformity noted of the left chest wall unchanged. Sclerotic bony metastatic disease best identified by prior CT. IMPRESSION: 1.  PowerPort catheter with tip over cavoatrial junction. 2. Stable left base atelectasis and or scarring. No new infiltrate noted. Reference is made to prior CT report for discussion of pulmonary nodules present. 3.  Sclerotic bony metastatic disease best identified by prior CT.  Electronically Signed   By: Marcello Moores  Register   On: 07/22/2018 11:49   Ct Angio Chest Pe W And/or Wo Contrast  Addendum Date: 08/10/2018   ADDENDUM REPORT: 08/10/2018 12:34 ADDENDUM: The linear opacity throughout the LEFT UPPER lobe now has the appearance compatible with radiation changes. The other ground-glass opacities within the lungs have resolved since 07/29/2018. There are no findings suspicious for pneumonia. Electronically Signed   By: Margarette Canada M.D.   On: 08/10/2018 12:34   Result Date: 08/10/2018 CLINICAL DATA:  85 year old male with acute shortness of breath. History of prostate cancer. EXAM: CT ANGIOGRAPHY CHEST WITH CONTRAST TECHNIQUE: Multidetector CT imaging of the chest was performed using the standard protocol during bolus administration of intravenous contrast. Multiplanar CT image reconstructions and MIPs were obtained to evaluate the vascular anatomy. CONTRAST:  45mL OMNIPAQUE IOHEXOL 350 MG/ML SOLN COMPARISON:  None. FINDINGS: Cardiovascular: This is a technically satisfactory study. A subsegmental pulmonary embolus within the RIGHT middle lobe (series 5: Images 163-168) and within the RIGHT LOWER lobe (5:143-149) noted. Mild cardiomegaly noted without CT evidence of RIGHT heart strain. Coronary artery atherosclerotic calcifications noted. No evidence of thoracic aortic aneurysm or pericardial effusion. Mediastinum/Nodes: No enlarged mediastinal, hilar, or axillary lymph nodes. Thyroid gland, trachea, and esophagus demonstrate no significant findings. Lungs/Pleura: Unchanged linear ground-glass opacity within the LEFT UPPER lobe noted. No airspace disease, consolidation, mass, pleural effusion or pneumothorax. Upper Abdomen: No acute abnormality Musculoskeletal: Sclerotic lesions within the visualized bones again noted compatible with metastatic disease. No new or acute bony abnormalities are identified. Review of the MIP images confirms the above findings. IMPRESSION: 1. Subsegmental  pulmonary embolus within the RIGHT middle lobe and within the RIGHT LOWER lobe. 2. Diffuse sclerotic bony metastases again noted. 3. Unchanged linear  ground-glass opacity within the LEFT UPPER lobe, nonspecific and unchanged from 07/29/2018. 4. Mild cardiomegaly and coronary artery disease. Critical Value/emergent results were called by telephone at the time of interpretation on 08/09/2018 at 7:31 pm to Dr. Charlotte Crumb , who verbally acknowledged these results. Electronically Signed: By: Margarette Canada M.D. On: 08/09/2018 19:31   Ct Angio Chest Pe W Or Wo Contrast  Result Date: 07/29/2018 CLINICAL DATA:  85 year old male with a history of shortness of breath for 2-3 months EXAM: CT ANGIOGRAPHY CHEST WITH CONTRAST TECHNIQUE: Multidetector CT imaging of the chest was performed using the standard protocol during bolus administration of intravenous contrast. Multiplanar CT image reconstructions and MIPs were obtained to evaluate the vascular anatomy. CONTRAST:  59mL ISOVUE-370 IOPAMIDOL (ISOVUE-370) INJECTION 76% COMPARISON:  07/04/2018 FINDINGS: Cardiovascular: Heart: No cardiomegaly. No pericardial fluid/thickening. Calcifications of left main, left anterior descending coronary arteries. Aorta: Unremarkable course, caliber, contour of the thoracic aorta. No aneurysm or dissection flap. No periaortic fluid. Minimal atherosclerotic changes. Pulmonary arteries: No central, lobar, segmental, or proximal subsegmental filling defects. Unchanged right port catheter. Mediastinum/Nodes: No mediastinal adenopathy. Unremarkable appearance of the thoracic esophagus. Unremarkable appearance of the thoracic inlet and thyroid. Lungs/Pleura: New ground-glass opacity of the left upper lobe with scattered regions of ground-glass opacity of the right upper lobe and bilateral lower lobes. These changes are new from the comparison CT. No pleural effusion. No confluent airspace disease. No pneumothorax. Upper Abdomen: No acute finding of  the upper abdomen Musculoskeletal: No acute displaced fracture. Similar pattern multiple sclerotic lesions of the appendicular and axial skeleton, including: Right and left scapula, right clavicle, bilateral aspect of the manubrium, T1, T2, T3, T4, T5, T6, T8, T9, T10, T11, T12, L1, bilateral ribs. Sclerotic changes within the rib deformities of the left 2-6 favored to represent metastases superimposed on remodeled fracture. Review of the MIP images confirms the above findings. IMPRESSION: CT negative for pulmonary emboli. New multifocal ground-glass opacity throughout the lungs, compatible with multifocal pneumonia. Differential would also include inflammatory changes such as pneumonitis, including toxic/inhalation exposure or hypersensitivity pneumonitis. Coronary artery disease and mild atherosclerosis. Redemonstration of skeletal metastases. Electronically Signed   By: Corrie Mckusick D.O.   On: 07/29/2018 13:28   Nm Xofigo Injection  Result Date: 07/13/2018  Trudi Ida was injected intravenously in Nuclear Medicine under the supervision of the attending radiologist    Assessment and plan- Patient is a 85 y.o. male with history of metastatic prostate cancer, receiving chemotherapy, last docetaxel received on 07/15/2018, recent admission due to multifocal pneumonia, presented to hospital for evaluation of shortness of breath. He has been symptomatic with dyspnea for about 2 weeks.  CT chest angiogram 07/29/2018 did not show any pulmonary embolism, it showed multifocal pneumonia patient was admitted and was treated with a course of antibiotics.  However his dyspnea continues, CT chest angiogram 08/09/2018 was positive for small segment of pulmonary embolism. I discussed with radiologist Dr. Margarette Canada.  Providing the history of recent radiation of left ribs, linear opacities throughout the left upper lobe now has appearance compatible with radiation changes.  The other groundglass opacities within the lung have  resolved since 07/29/2018.  Acute pulmonary embolism,  Ultrasound venous bilateral lower extremity positive for acute bilateral DVT isolated to the calf veins. Agree with IV heparin.  Thrombosis may be secondary to metastatic cancer or immobilization due to recent hospitalization.   If patient symptoms improves, recommend switch to Eliquis.  Patient has follow-up appointments with me on 08/17/2018 and I  will assess patient's tolerability on blood thinner and also his symptoms.  #Metastatic prostate cancer, chemotherapy is on hold.  Follow-up outpatient.  Thank you for allowing me to participate in the care of this patient.  Total face to face encounter time for this patient visit was 70 min. >50% of the time was  spent in counseling and coordination of care.    Earlie Server, MD, PhD Hematology Oncology [facility] Pager- [phone] 08/10/2018

## 2018-08-10 NOTE — Consult Note (Signed)
ANTICOAGULATION CONSULT NOTE - Initial Consult  Pharmacy Consult for Heparin Indication: pulmonary embolus  No Known Allergies  Patient Measurements: Height: 5\' 9"  (175.3 cm) Weight: 153 lb 12.8 oz (69.8 kg) IBW/kg (Calculated) : 70.7 Heparin Dosing Weight: 74.8 kg  Vital Signs: Temp: 98.3 F (36.8 C) (03/11 0348) Temp Source: Oral (03/11 0348) BP: 116/64 (03/11 0348) Pulse Rate: 79 (03/11 0348)  Labs: Recent Labs    08/09/18 1744 08/09/18 1931 08/10/18 0429  HGB 10.6*  --  9.8*  HCT 32.0*  --  29.3*  PLT 176  --  169  APTT  --  27  --   LABPROT  --  14.2  --   INR  --  1.1  --   HEPARINUNFRC  --   --  1.10*  CREATININE 1.20  --  1.20  TROPONINI <0.03  --   --     Estimated Creatinine Clearance: 43.6 mL/min (by C-G formula based on SCr of 1.2 mg/dL).   Medical History: Past Medical History:  Diagnosis Date  . Arthritis   . Benign enlargement of prostate   . CAD (coronary artery disease)   . Constipation   . Elevated PSA   . Family history of breast cancer   . Family history of prostate cancer   . GERD (gastroesophageal reflux disease)   . Hypertension   . Incomplete bladder emptying   . Nocturia   . Prostate cancer ([place]) 05/03/2017   Rad tx's and Lupron shots.   . Urgency of micturation   . Urinary frequency     Medications:  Patient not on any PTA anticoagulants.  Assessment: 85 yo male presenting with PE. PMH of metastatic prostate cancer.  Goal of Therapy:  Heparin level 0.3-0.7 units/ml Monitor platelets by anticoagulation protocol: Yes   Plan:  03/11 @ 0430 HL 1.10 supratherapeutic. Will hold drip for 1 hour and decrease to 1050 units/hr and will recheck @ 1600, CBC trending down will continue to monitor.  Tobie Lords, PharmD, BCPS Clinical Pharmacist 08/10/2018

## 2018-08-11 LAB — CBC
HCT: 28.3 % — ABNORMAL LOW (ref 39.0–52.0)
Hemoglobin: 9.5 g/dL — ABNORMAL LOW (ref 13.0–17.0)
MCH: 27.7 pg (ref 26.0–34.0)
MCHC: 33.6 g/dL (ref 30.0–36.0)
MCV: 82.5 fL (ref 80.0–100.0)
Platelets: 147 10*3/uL — ABNORMAL LOW (ref 150–400)
RBC: 3.43 MIL/uL — ABNORMAL LOW (ref 4.22–5.81)
RDW: 16.4 % — ABNORMAL HIGH (ref 11.5–15.5)
WBC: 7.3 10*3/uL (ref 4.0–10.5)
nRBC: 0 % (ref 0.0–0.2)

## 2018-08-11 LAB — HEPARIN LEVEL (UNFRACTIONATED): HEPARIN UNFRACTIONATED: 0.52 [IU]/mL (ref 0.30–0.70)

## 2018-08-11 MED ORDER — APIXABAN 5 MG PO TABS
10.0000 mg | ORAL_TABLET | Freq: Two times a day (BID) | ORAL | Status: DC
  Administered 2018-08-11: 10 mg via ORAL
  Filled 2018-08-11: qty 2

## 2018-08-11 MED ORDER — APIXABAN 5 MG PO TABS: 5.0000 mg | ORAL_TABLET | Freq: Two times a day (BID) | ORAL | Status: DC

## 2018-08-11 MED ORDER — APIXABAN 5 MG PO TABS: ORAL_TABLET | ORAL | 1 refills | Status: AC

## 2018-08-11 MED ORDER — HEPARIN SOD (PORK) LOCK FLUSH 100 UNIT/ML IV SOLN
500.0000 [IU] | Freq: Once | INTRAVENOUS | Status: AC
  Administered 2018-08-11: 12:00:00 500 [IU] via INTRAVENOUS
  Filled 2018-08-11: qty 5

## 2018-08-11 NOTE — Discharge Summary (Signed)
[name] NAME: Matthew Hunter    MR#:  [id number]  DATE OF BIRTH:  1929-11-04  DATE OF ADMISSION:  08/09/2018   ADMITTING PHYSICIAN: Saundra Shelling, MD  DATE OF DISCHARGE: 08/11/2018  PRIMARY CARE PHYSICIAN: Maryland Pink, MD   ADMISSION DIAGNOSIS:  DVT (deep venous thrombosis) (HCC) [I82.409] PE (physical exam), annual [Z00.00] Dyspnea, unspecified type [R06.00] DISCHARGE DIAGNOSIS:  Active Problems:   Pulmonary embolism (HCC)   Dyspnea   Acute deep vein thrombosis (DVT) of calf muscle vein of both lower extremities   Prostate cancer metastatic to bone ([employer])  SECONDARY DIAGNOSIS:   Past Medical History:  Diagnosis Date   Arthritis    Benign enlargement of prostate    CAD (coronary artery disease)    Constipation    Elevated PSA    Family history of breast cancer    Family history of prostate cancer    GERD (gastroesophageal reflux disease)    Hypertension    Incomplete bladder emptying    Nocturia    Prostate cancer ([place]) 05/03/2017   Rad tx's and Lupron shots.    Urgency of micturation    Urinary frequency    HOSPITAL COURSE:  85 year old male patientwith a known history ofcoronary disease, metastatic prostate cancer, GERD, hypertension presented to the emergency room for shortness of breath.  -Acute right lung pulmonaryembolism The patient has been treated with heparin drip, changed to Eliquis twice daily per oncology consult.  Follow-up with Dr. Tasia Catchings as outpatient.  Bilateral leg DVT. [place]: Positive for acute bilateral deep venous thrombosis isolated to the calf veins (posterior tibial veins on the right, and peroneal veins on the left).  Treatment as above.  -Metastatic prostate cancer Oncology follow-up Supportive care  -GERD Continue proton pump inhibitor  Anemia of chronic disease.  Stable.  DISCHARGE CONDITIONS:  Stable, discharge to home today. CONSULTS OBTAINED:    Treatment Team:  Cammie Sickle, MD DRUG ALLERGIES:  No Known Allergies DISCHARGE MEDICATIONS:   Allergies as of 08/11/2018   No Known Allergies     Medication List    TAKE these medications   apixaban 5 MG Tabs tablet Commonly known as:  ELIQUIS 10 mg po bid for 9 doses, then 5 mg po bid.   aspirin EC 81 MG tablet Take 81 mg by mouth daily.   calcium-vitamin D 500-200 MG-UNIT tablet Take 1 tablet by mouth daily.   fentaNYL 50 MCG/HR Commonly known as:  DURAGESIC Place 10 patches onto the skin every 3 (three) days. Start taking on:  August 17, 2018   finasteride 5 MG tablet Commonly known as:  PROSCAR Take 1 tablet (5 mg total) by mouth daily.   hydrocortisone 2.5 % cream APP EXT AA BID   megestrol 40 MG tablet Commonly known as:  MEGACE Take 2 tablets (80 mg total) by mouth 2 (two) times daily.   mirtazapine 15 MG tablet Commonly known as:  REMERON Take 15 mg by mouth at bedtime.   multivitamin with minerals Tabs tablet Take 1 tablet by mouth daily.   nortriptyline 25 MG capsule Commonly known as:  PAMELOR Take 25 mg by mouth at bedtime. Reported on 05/16/2015   omeprazole 20 MG capsule Commonly known as:  PRILOSEC TAKE 1 CAPSULE(20 MG) BY MOUTH DAILY   ondansetron 8 MG tablet Commonly known as:  Zofran Take 1 tablet (8 mg total) by mouth 2 (two) times daily as needed for refractory nausea / vomiting.  Oxycodone HCl 10 MG Tabs Take 1 tablet (10 mg total) by mouth every 6 (six) hours as needed.   polyethylene glycol packet Commonly known as:  MIRALAX / GLYCOLAX Take 17 g by mouth daily. Mix one tablespoon with 8oz of your favorite juice or water every day until you are having soft formed stools. Then start taking once daily if you didn't have a stool the day before.   prochlorperazine 10 MG tablet Commonly known as:  COMPAZINE Take 1 tablet (10 mg total) by mouth every 6 (six) hours as needed (Nausea or vomiting).   pyridOXINE 50 MG  tablet Commonly known as:  VITAMIN B-6 Take 50 mg by mouth daily.   senna-docusate 8.6-50 MG tablet Commonly known as:  Senokot-S Take 2 tablets by mouth daily.        DISCHARGE INSTRUCTIONS:  See AVS.  If you experience worsening of your admission symptoms, develop shortness of breath, life threatening emergency, suicidal or homicidal thoughts you must seek medical attention immediately by calling 911 or calling your MD immediately  if symptoms less severe.  You Must read complete instructions/literature along with all the possible adverse reactions/side effects for all the Medicines you take and that have been prescribed to you. Take any new Medicines after you have completely understood and accpet all the possible adverse reactions/side effects.   Please note  You were cared for by a hospitalist during your hospital stay. If you have any questions about your discharge medications or the care you received while you were in the hospital after you are discharged, you can call the unit and asked to speak with the hospitalist on call if the hospitalist that took care of you is not available. Once you are discharged, your primary care physician will handle any further medical issues. Please note that NO REFILLS for any discharge medications will be authorized once you are discharged, as it is imperative that you return to your primary care physician (or establish a relationship with a primary care physician if you do not have one) for your aftercare needs so that they can reassess your need for medications and monitor your lab values.    On the day of Discharge:  VITAL SIGNS:  Blood pressure (!) 97/56, pulse 86, temperature 97.8 F (36.6 C), resp. rate 20, height 5\' 9"  (1.753 m), weight 69.8 kg, SpO2 99 %. PHYSICAL EXAMINATION:  GENERAL:  85 y.o.-year-old patient lying in the bed with no acute distress.  EYES: Pupils equal, round, reactive to light and accommodation. No scleral icterus.  Extraocular muscles intact.  HEENT: Head atraumatic, normocephalic. Oropharynx and nasopharynx clear.  NECK:  Supple, no jugular venous distention. No thyroid enlargement, no tenderness.  LUNGS: Normal breath sounds bilaterally, no wheezing, rales,rhonchi or crepitation. No use of accessory muscles of respiration.  CARDIOVASCULAR: S1, S2 normal. No murmurs, rubs, or gallops.  ABDOMEN: Soft, non-tender, non-distended. Bowel sounds present. No organomegaly or mass.  EXTREMITIES: No pedal edema, cyanosis, or clubbing.  NEUROLOGIC: Cranial nerves II through XII are intact. Muscle strength 5/5 in all extremities. Sensation intact. Gait not checked.  PSYCHIATRIC: The patient is alert and oriented x 3.  SKIN: No obvious rash, lesion, or ulcer.  DATA REVIEW:   CBC Recent Labs  Lab 08/11/18 0111  WBC 7.3  HGB 9.5*  HCT 28.3*  PLT 147*    Chemistries  Recent Labs  Lab 08/10/18 0429  NA 134*  K 4.1  CL 103  CO2 21*  GLUCOSE 84  BUN  19  CREATININE 1.20  CALCIUM 8.9     Microbiology Results  Results for orders placed or performed during the hospital encounter of 07/29/18  CULTURE, BLOOD (ROUTINE X 2) w Reflex to ID Panel     Status: None   Collection Time: 07/29/18  5:42 PM  Result Value Ref Range Status   Specimen Description BLOOD RT HAND  Final   Special Requests   Final    BOTTLES DRAWN AEROBIC AND ANAEROBIC Blood Culture results may not be optimal due to an inadequate volume of blood received in culture bottles   Culture   Final    NO GROWTH 5 DAYS Performed at [facility], [address] [place], [place] [address]    Report Status 08/03/2018 FINAL  Final  CULTURE, BLOOD (ROUTINE X 2) w Reflex to ID Panel     Status: None   Collection Time: 07/29/18  5:42 PM  Result Value Ref Range Status   Specimen Description BLOOD LEFT HAND  Final   Special Requests   Final    BOTTLES DRAWN AEROBIC ONLY Blood Culture results may not be optimal due to an inadequate volume  of blood received in culture bottles   Culture   Final    NO GROWTH 5 DAYS Performed at [facility], [address] [place], [place] [address]    Report Status 08/03/2018 FINAL  Final    RADIOLOGY:  US Venous Img Lower Bilateral  Result Date: 08/10/2018 CLINICAL DATA:  85 year old male with pulmonary embolism. Evaluate for residual clot burden EXAM: BILATERAL LOWER EXTREMITY VENOUS DOPPLER ULTRASOUND TECHNIQUE: Gray-scale sonography with graded compression, as well as color Doppler and duplex ultrasound were performed to evaluate the lower extremity deep venous systems from the level of the common femoral vein and including the common femoral, femoral, profunda femoral, popliteal and calf veins including the posterior tibial, peroneal and gastrocnemius veins when visible. The superficial great saphenous vein was also interrogated. Spectral Doppler was utilized to evaluate flow at rest and with distal augmentation maneuvers in the common femoral, femoral and popliteal veins. COMPARISON:  None. FINDINGS: RIGHT LOWER EXTREMITY Common Femoral Vein: No evidence of thrombus. Normal compressibility, respiratory phasicity and response to augmentation. Saphenofemoral Junction: No evidence of thrombus. Normal compressibility and flow on color Doppler imaging. Profunda Femoral Vein: No evidence of thrombus. Normal compressibility and flow on color Doppler imaging. Femoral Vein: No evidence of thrombus. Normal compressibility, respiratory phasicity and response to augmentation. Popliteal Vein: No evidence of thrombus. Normal compressibility, respiratory phasicity and response to augmentation. Calf Veins: Normal appearance of the peroneal veins. However, the posterior tibial veins demonstrate no evidence of color flow on color Doppler imaging and are noncompressible consistent with acute occlusive DVT. Superficial Great Saphenous Vein: No evidence of thrombus. Normal compressibility. Venous Reflux:  None.  Other Findings:  None. LEFT LOWER EXTREMITY Common Femoral Vein: No evidence of thrombus. Normal compressibility, respiratory phasicity and response to augmentation. Saphenofemoral Junction: No evidence of thrombus. Normal compressibility and flow on color Doppler imaging. Profunda Femoral Vein: No evidence of thrombus. Normal compressibility and flow on color Doppler imaging. Femoral Vein: No evidence of thrombus. Normal compressibility, respiratory phasicity and response to augmentation. Popliteal Vein: No evidence of thrombus. Normal compressibility, respiratory phasicity and response to augmentation. Calf Veins: Normal flow in the posterior tibial veins. Abnormal appearance of the peroneal veins. The peroneal veins are not compressible in the lumen filled with low-level internal echoes consistent with acute occlusive DVT. Superficial Great Saphenous Vein:  No evidence of thrombus. Normal compressibility. Venous Reflux:  None. Other Findings:  None. IMPRESSION: Positive for acute bilateral deep venous thrombosis isolated to the calf veins (posterior tibial veins on the right, and peroneal veins on the left). Electronically Signed   By: Jacqulynn Cadet M.D.   On: 08/10/2018 14:17     Management plans discussed with the patient, family and they are in agreement.  CODE STATUS: Partial Code   TOTAL TIME TAKING CARE OF THIS PATIENT: 32 minutes.    Demetrios Loll M.D on 08/11/2018 at 11:29 AM  Between 7am to 6pm - Pager - [phone]  After 6pm go to [email or web address] - Proofreader  [facility]  Office  [phone]  CC: Primary care physician; Maryland Pink, MD   Note: This dictation was prepared with Dragon dictation along with smaller phrase technology. Any transcriptional errors that result from this process are unintentional.

## 2018-08-11 NOTE — Discharge Instructions (Signed)
Information on my medicine - ELIQUIS (apixaban)  This medication education was reviewed with me or my healthcare representative as part of my discharge preparation.    Why was Eliquis prescribed for you? Eliquis was prescribed to treat blood clots that may have been found in the veins of your legs (deep vein thrombosis) or in your lungs (pulmonary embolism) and to reduce the risk of them occurring again.  What do You need to know about Eliquis ? The starting dose is 10 mg (two 5 mg tablets) taken TWICE daily for the FIRST SEVEN (7) DAYS, then on 08/18/2018  the dose is reduced to ONE 5 mg tablet taken TWICE daily.  Eliquis may be taken with or without food.   Try to take the dose about the same time in the morning and in the evening. If you have difficulty swallowing the tablet whole please discuss with your pharmacist how to take the medication safely.  Take Eliquis exactly as prescribed and DO NOT stop taking Eliquis without talking to the doctor who prescribed the medication.  Stopping may increase your risk of developing a new blood clot.  Refill your prescription before you run out.  After discharge, you should have regular check-up appointments with your healthcare provider that is prescribing your Eliquis.    What do you do if you miss a dose? If a dose of ELIQUIS is not taken at the scheduled time, take it as soon as possible on the same day and twice-daily administration should be resumed. The dose should not be doubled to make up for a missed dose.  Important Safety Information A possible side effect of Eliquis is bleeding. You should call your healthcare provider right away if you experience any of the following: ? Bleeding from an injury or your nose that does not stop. ? Unusual colored urine (red or dark brown) or unusual colored stools (red or black). ? Unusual bruising for unknown reasons. ? A serious fall or if you hit your head (even if there is no  bleeding).  Some medicines may interact with Eliquis and might increase your risk of bleeding or clotting while on Eliquis. To help avoid this, consult your healthcare provider or pharmacist prior to using any new prescription or non-prescription medications, including herbals, vitamins, non-steroidal anti-inflammatory drugs (NSAIDs) and supplements.  This website has more information on Eliquis (apixaban): http://www.eliquis.com/eliquis/home

## 2018-08-11 NOTE — Consult Note (Signed)
ANTICOAGULATION CONSULT NOTE - Initial Consult  Pharmacy Consult for Heparin Indication: pulmonary embolus  No Known Allergies  Patient Measurements: Height: 5\' 9"  (175.3 cm) Weight: 153 lb 12.8 oz (69.8 kg) IBW/kg (Calculated) : 70.7 Heparin Dosing Weight: 74.8 kg  Vital Signs: Temp: 97.8 F (36.6 C) (03/12 0418) Temp Source: Oral (03/11 2016) BP: 97/56 (03/12 0418) Pulse Rate: 86 (03/12 0418)  Labs: Recent Labs    08/09/18 1744 08/09/18 1931 08/10/18 0429 08/10/18 1539 08/11/18 0111  HGB 10.6*  --  9.8*  --  9.5*  HCT 32.0*  --  29.3*  --  28.3*  PLT 176  --  169  --  147*  APTT  --  27  --   --   --   LABPROT  --  14.2  --   --   --   INR  --  1.1  --   --   --   HEPARINUNFRC  --   --  1.10* 0.81* 0.52  CREATININE 1.20  --  1.20  --   --   TROPONINI <0.03  --   --   --   --     Estimated Creatinine Clearance: 43.6 mL/min (by C-G formula based on SCr of 1.2 mg/dL).   Medical History: Past Medical History:  Diagnosis Date  . Arthritis   . Benign enlargement of prostate   . CAD (coronary artery disease)   . Constipation   . Elevated PSA   . Family history of breast cancer   . Family history of prostate cancer   . GERD (gastroesophageal reflux disease)   . Hypertension   . Incomplete bladder emptying   . Nocturia   . Prostate cancer ([place]) 05/03/2017   Rad tx's and Lupron shots.   . Urgency of micturation   . Urinary frequency     Medications:  Patient not on any PTA anticoagulants.  Assessment: 85 yo male presenting with PE. PMH of metastatic prostate cancer.  Goal of Therapy:  Heparin level 0.3-0.7 units/ml Monitor platelets by anticoagulation protocol: Yes   Plan:  03/12 @ 0100 HL 0.52 therapeutic. Will continue current rate and will recheck HL @ 0900, CBC trending down will continue to monitor.  Tobie Lords, PharmD, BCPS Clinical Pharmacist 08/11/2018

## 2018-08-11 NOTE — Progress Notes (Signed)
Discharge instructions given and went over with patient and patients wife at bedside. All questions answered. Patient discharged home with wife. Madlyn Frankel, RN

## 2018-08-17 ENCOUNTER — Inpatient Hospital Stay (HOSPITAL_BASED_OUTPATIENT_CLINIC_OR_DEPARTMENT_OTHER): Payer: Medicare Other | Admitting: Oncology

## 2018-08-17 ENCOUNTER — Inpatient Hospital Stay: Payer: Medicare Other

## 2018-08-17 ENCOUNTER — Other Ambulatory Visit: Payer: Self-pay

## 2018-08-17 ENCOUNTER — Inpatient Hospital Stay: Payer: Medicare Other | Admitting: Oncology

## 2018-08-17 ENCOUNTER — Encounter: Payer: Self-pay | Admitting: Oncology

## 2018-08-17 ENCOUNTER — Other Ambulatory Visit: Payer: Self-pay | Admitting: Gastroenterology

## 2018-08-17 VITALS — BP 104/65 | HR 80 | Temp 96.9°F | Resp 18 | Wt 152.3 lb

## 2018-08-17 VITALS — BP 121/65

## 2018-08-17 DIAGNOSIS — R0602 Shortness of breath: Secondary | ICD-10-CM

## 2018-08-17 DIAGNOSIS — E871 Hypo-osmolality and hyponatremia: Secondary | ICD-10-CM | POA: Diagnosis not present

## 2018-08-17 DIAGNOSIS — D649 Anemia, unspecified: Secondary | ICD-10-CM | POA: Diagnosis not present

## 2018-08-17 DIAGNOSIS — Z87891 Personal history of nicotine dependence: Secondary | ICD-10-CM | POA: Diagnosis not present

## 2018-08-17 DIAGNOSIS — Z95828 Presence of other vascular implants and grafts: Secondary | ICD-10-CM

## 2018-08-17 DIAGNOSIS — C61 Malignant neoplasm of prostate: Secondary | ICD-10-CM

## 2018-08-17 DIAGNOSIS — R630 Anorexia: Secondary | ICD-10-CM | POA: Diagnosis not present

## 2018-08-17 DIAGNOSIS — C7951 Secondary malignant neoplasm of bone: Secondary | ICD-10-CM

## 2018-08-17 DIAGNOSIS — R634 Abnormal weight loss: Secondary | ICD-10-CM | POA: Diagnosis not present

## 2018-08-17 DIAGNOSIS — J189 Pneumonia, unspecified organism: Secondary | ICD-10-CM | POA: Diagnosis not present

## 2018-08-17 DIAGNOSIS — I10 Essential (primary) hypertension: Secondary | ICD-10-CM | POA: Diagnosis not present

## 2018-08-17 DIAGNOSIS — N179 Acute kidney failure, unspecified: Secondary | ICD-10-CM

## 2018-08-17 DIAGNOSIS — Z515 Encounter for palliative care: Secondary | ICD-10-CM | POA: Diagnosis not present

## 2018-08-17 DIAGNOSIS — Z79899 Other long term (current) drug therapy: Secondary | ICD-10-CM | POA: Diagnosis not present

## 2018-08-17 DIAGNOSIS — G893 Neoplasm related pain (acute) (chronic): Secondary | ICD-10-CM

## 2018-08-17 DIAGNOSIS — I2510 Atherosclerotic heart disease of native coronary artery without angina pectoris: Secondary | ICD-10-CM | POA: Diagnosis not present

## 2018-08-17 DIAGNOSIS — R1310 Dysphagia, unspecified: Secondary | ICD-10-CM

## 2018-08-17 DIAGNOSIS — K219 Gastro-esophageal reflux disease without esophagitis: Secondary | ICD-10-CM | POA: Diagnosis not present

## 2018-08-17 LAB — CBC WITH DIFFERENTIAL/PLATELET
Abs Immature Granulocytes: 0.08 10*3/uL — ABNORMAL HIGH (ref 0.00–0.07)
BASOS ABS: 0 10*3/uL (ref 0.0–0.1)
Basophils Relative: 0 %
EOS PCT: 2 %
Eosinophils Absolute: 0.2 10*3/uL (ref 0.0–0.5)
HCT: 33.2 % — ABNORMAL LOW (ref 39.0–52.0)
Hemoglobin: 11.3 g/dL — ABNORMAL LOW (ref 13.0–17.0)
Immature Granulocytes: 1 %
Lymphocytes Relative: 17 %
Lymphs Abs: 1.3 10*3/uL (ref 0.7–4.0)
MCH: 28.2 pg (ref 26.0–34.0)
MCHC: 34 g/dL (ref 30.0–36.0)
MCV: 82.8 fL (ref 80.0–100.0)
Monocytes Absolute: 0.7 10*3/uL (ref 0.1–1.0)
Monocytes Relative: 9 %
Neutro Abs: 5.6 10*3/uL (ref 1.7–7.7)
Neutrophils Relative %: 71 %
Platelets: 170 10*3/uL (ref 150–400)
RBC: 4.01 MIL/uL — ABNORMAL LOW (ref 4.22–5.81)
RDW: 16.7 % — ABNORMAL HIGH (ref 11.5–15.5)
WBC: 8 10*3/uL (ref 4.0–10.5)
nRBC: 0 % (ref 0.0–0.2)

## 2018-08-17 LAB — COMPREHENSIVE METABOLIC PANEL
ALT: 30 U/L (ref 0–44)
AST: 30 U/L (ref 15–41)
Albumin: 3.4 g/dL — ABNORMAL LOW (ref 3.5–5.0)
Alkaline Phosphatase: 42 U/L (ref 38–126)
Anion gap: 9 (ref 5–15)
BUN: 27 mg/dL — ABNORMAL HIGH (ref 8–23)
CO2: 20 mmol/L — AB (ref 22–32)
Calcium: 9.3 mg/dL (ref 8.9–10.3)
Chloride: 106 mmol/L (ref 98–111)
Creatinine, Ser: 1.41 mg/dL — ABNORMAL HIGH (ref 0.61–1.24)
GFR calc Af Amer: 52 mL/min — ABNORMAL LOW (ref >60)
GFR calc non Af Amer: 45 mL/min — ABNORMAL LOW (ref >60)
GLUCOSE: 140 mg/dL — AB (ref 70–99)
Potassium: 4.5 mmol/L (ref 3.5–5.1)
Sodium: 135 mmol/L (ref 135–145)
Total Bilirubin: 0.8 mg/dL (ref 0.3–1.2)
Total Protein: 7.2 g/dL (ref 6.5–8.1)

## 2018-08-17 LAB — PSA: Prostatic Specific Antigen: 480 ng/mL — ABNORMAL HIGH (ref 0.00–4.00)

## 2018-08-17 LAB — SAMPLE TO BLOOD BANK

## 2018-08-17 MED ORDER — HEPARIN SOD (PORK) LOCK FLUSH 100 UNIT/ML IV SOLN
500.0000 [IU] | Freq: Once | INTRAVENOUS | Status: AC
  Administered 2018-08-17: 500 [IU]

## 2018-08-17 MED ORDER — SODIUM CHLORIDE 0.9 % IV SOLN
Freq: Once | INTRAVENOUS | Status: AC
  Administered 2018-08-17: 11:00:00 via INTRAVENOUS
  Filled 2018-08-17: qty 250

## 2018-08-17 MED ORDER — SODIUM CHLORIDE 0.9% FLUSH
10.0000 mL | Freq: Once | INTRAVENOUS | Status: AC | PRN
  Administered 2018-08-17: 10 mL
  Filled 2018-08-17: qty 10

## 2018-08-17 NOTE — Progress Notes (Signed)
Hematology/Oncology  Follow up note [facility] [phone] [phone]  Patient Care Team: [place] Pink, MD as PCP - General (Family Medicine) Earlie Server, MD as Medical Oncologist (Medical Oncology)  REASON FOR VISIT Follow up for antineoplasm treatment of Prostate Cancer  HISTORY OF PRESENTING ILLNESS:  Matthew Hunter 85 y.o.  male with PMH listed as below who was referred by [facility] Urology Provider [name] to me for evaluation of clinical prostate cancer and management.  He has a history of elevated PSA and BPH. PSA trends as follows: 11/14/2014 4.3, 11/07/2015 7.1, 05/11/2016 10.4, 06/11/2016 10.6, 07/07/2016 7.8, 01/06/2017 86.8.  He was seen by urology and was diagnosed with clinical prostate cancer given the extreme elevation of PSA and prostate nodule. Biopsy was not pursued. He was treated with [name] on 01/29/2017,  # prostate biopsy on 03/10/2017. 5 out of 6 cores positive for prostate cancer, with highest gleason score group 9 (4+5), + perineural invasion.  Stage IV prostate cancer, multiple osseus metastatic disease. No visceral involvement.   01/18/2018  CT scan image was independently reviewed by me and discussed with patient.  Patient has progression of bone metastatic disease only  Current treatment  Patient got first [place] on 01/29/2018, switched to leupron on 04/13/2015,  Q6 months.  .Testosterone level at 14, castration level.  Upfront Gillermina Phy was added on December 2019 to assist fast disease control (phase II trial [place] B, [facility]et Oncol. 2014) S/p palliative RT to lumber spine and SI joints. Xtandi discontinued on due to CNS toxicity in April 2019.  12/09/17 Abiaterone discontinued.  7/ 18/2019 Start Apalutimide 240mg  daily.   Trend of tumor marker  PSA 86.8 -->64.78 -->131--> 242 added Xtanti --> 17.74-->1.82 Xtandi stopped due to CNS toxicity in April 2019. --> 1.79- (added Abiaterone)-> 11.06-->24.53--> 39.48  [Abiaterone stopped, Added Apalutimid]--> 65.71-->88.63--> Xofigo treatment--> 215--> Back on Xtandi reduced dose.   12/09/17 Abiaterone discontinued due to rising PSA and disease progression. 12/16/2017 Start Apalutimide 240mg  daily. Held on 01/13/2018 due to rising PSA, also causing patient to feel dizziness/black out.  Started Xofigo treatment on 02/23/2018. 02/28/2018, back on Xtandi reduced dose 120mg . Xtandi stopped Dec.2020 due to   05/20/2018 Weekly Docetaxel 2 weeks on one week off.  Finish 6 cycles of Xofigo treatments on 07/13/2018. 1 unit of irradiated PRBC transfusion on 07/08/2018   #  admitted from 07/29/2018-08/01/2018 for multi focal pneumonia. Patient received IV antibiotics and azithromycin during his admission.  Symptoms slightly better and was discharged home for follow-up.   INTERVAL HISTORY Patient presents to follow up for management of metastatic prostate cancer and post hospitalization follow-up. Patient was admitted from 08/09/2018- 08/11/2018 due to shortness of breath. 08/09/2018 CT chest angiogram showed subsegmental pulmonary embolism within the right middle lobe and within the right lower lobe.  Again shown diffuse sclerotic bone metastasis.  Unchanged linear groundglass opacity within the left upper lobe nonspecific and unchanged from the study that was done 07/29/2018.  Mild cardiomegaly and coronary artery disease. Patient was started on anticoagulation.  Currently he is on Eliquis 5 mg twice daily. 08/10/2018, bilateral lower extremity venous ultrasound showed positive for acute bilateral deep vein thrombosis isolated to the calf veins. Today patient was seen in the clinic for follow-up.  Continue to reports feeling very shortness of breath.  Pulse oximetry showed 98% on room air.  He has been subjectively feeling very shortness of breath and have tachypnea for the past couple of weeks.  Per wife, he does not breathe  that fast when he sleeps  For neoplasm related pain, uses  fentanyl patch 50 MCG every 72 hours.  He reports he is out of the patch and has not got the refills yet.  Appetite is very poor.  He continues to lose weight, he has lost 13 pounds since last visit..  Reports severe dysphagia with solid food.  Able to drink Ensure.  Wife reports that sometimes even difficulty with fluids.  Denies any fever, chills.  No nasal congestion.  He is generally coughs up with thick mucus.  Review of Systems  Constitutional: Positive for appetite change, fatigue and unexpected weight change. Negative for chills and fever.  HENT:   Negative for hearing loss and voice change.        Nasal congestion  Eyes: Negative for eye problems and icterus.  Respiratory: Positive for shortness of breath. Negative for chest tightness and cough.   Cardiovascular: Negative for chest pain and leg swelling.  Gastrointestinal: Negative for abdominal distention, abdominal pain and diarrhea.  Endocrine: Negative for hot flashes.  Genitourinary: Negative for difficulty urinating, dysuria and frequency.   Musculoskeletal: Negative for arthralgias and back pain.  Skin: Negative for itching and rash.  Neurological: Negative for light-headedness and numbness.  Hematological: Negative for adenopathy. Does not bruise/bleed easily.  Psychiatric/Behavioral: Negative for confusion.    MEDICAL HISTORY:  Past Medical History:  Diagnosis Date   Arthritis    Benign enlargement of prostate    CAD (coronary artery disease)    Constipation    Elevated PSA    Family history of breast cancer    Family history of prostate cancer    GERD (gastroesophageal reflux disease)    Hypertension    Incomplete bladder emptying    Nocturia    Prostate cancer ([place]) 05/03/2017   Rad tx's and Lupron shots.    Urgency of micturation    Urinary frequency     SURGICAL HISTORY: Past Surgical History:  Procedure Laterality Date   GALLBLADDER SURGERY     PORTA CATH INSERTION N/A 06/07/2018    Procedure: PORTA CATH INSERTION;  Surgeon: Katha Cabal, MD;  Location: [facility];  Service: Cardiovascular;  Laterality: N/A;   PROSTATE SURGERY     STOMACH SURGERY      SOCIAL HISTORY: Social History   Socioeconomic History   Marital status: Married    Spouse name: Not on file   Number of children: Not on file   Years of education: Not on file   Highest education level: Not on file  Occupational History   Not on file  Social Needs   Financial resource strain: Not on file   Food insecurity:    Worry: Not on file    Inability: Not on file   Transportation needs:    Medical: Not on file    Non-medical: Not on file  Tobacco Use   Smoking status: Former Smoker    Last attempt to quit: 06/01/1998    Years since quitting: 20.2   Smokeless tobacco: Never Used  Substance and Sexual Activity   Alcohol use: No    Alcohol/week: 0.0 standard drinks   Drug use: Not Currently    Frequency: 2.0 times per week    Types: Marijuana    Comment: marijuana two time monthly gives him an appetite   Sexual activity: Not on file  Lifestyle   Physical activity:    Days per week: Not on file    Minutes per session: Not on  file   Stress: Not on file  Relationships   Social connections:    Talks on phone: Not on file    Gets together: Not on file    Attends religious service: Not on file    Active member of club or organization: Not on file    Attends meetings of clubs or organizations: Not on file    Relationship status: Not on file   Intimate partner violence:    Fear of current or ex partner: Not on file    Emotionally abused: Not on file    Physically abused: Not on file    Forced sexual activity: Not on file  Other Topics Concern   Not on file  Social History Narrative   Not on file    FAMILY HISTORY: Family History  Problem Relation Age of Onset   Hypertension Mother    Stroke Father    Breast cancer Niece        dx  under 32    Breast cancer Daughter 38   Prostate cancer Son 35   Kidney disease Neg Hx    Kidney cancer Neg Hx    Bladder Cancer Neg Hx     ALLERGIES:  has No Known Allergies.  MEDICATIONS:  Current Outpatient Medications  Medication Sig Dispense Refill   apixaban (ELIQUIS) 5 MG TABS tablet 10 mg po bid for 9 doses, then 5 mg po bid. 60 tablet 1   aspirin EC 81 MG tablet Take 81 mg by mouth daily.      Calcium Carb-Cholecalciferol (CALCIUM-VITAMIN D) 500-200 MG-UNIT tablet Take 1 tablet by mouth daily. 90 tablet 0   fentaNYL (DURAGESIC) 50 MCG/HR Place 10 patches onto the skin every 3 (three) days. 5 patch 0   finasteride (PROSCAR) 5 MG tablet Take 1 tablet (5 mg total) by mouth daily. 90 tablet 3   hydrocortisone 2.5 % cream APP EXT AA BID  0   megestrol (MEGACE) 40 MG tablet Take 2 tablets (80 mg total) by mouth 2 (two) times daily. 120 tablet 3   mirtazapine (REMERON) 15 MG tablet Take 15 mg by mouth at bedtime.  11   Multiple Vitamin (MULTIVITAMIN WITH MINERALS) TABS tablet Take 1 tablet by mouth daily. 30 tablet 0   nortriptyline (PAMELOR) 25 MG capsule Take 25 mg by mouth at bedtime. Reported on 05/16/2015  1   omeprazole (PRILOSEC) 20 MG capsule TAKE 1 CAPSULE(20 MG) BY MOUTH DAILY 30 capsule 4   ondansetron (ZOFRAN) 8 MG tablet Take 1 tablet (8 mg total) by mouth 2 (two) times daily as needed for refractory nausea / vomiting. 30 tablet 1   Oxycodone HCl 10 MG TABS Take 1 tablet (10 mg total) by mouth every 6 (six) hours as needed. 60 tablet 0   polyethylene glycol (MIRALAX / GLYCOLAX) packet Take 17 g by mouth daily. Mix one tablespoon with 8oz of your favorite juice or water every day until you are having soft formed stools. Then start taking once daily if you didn't have a stool the day before. 30 each 0   prochlorperazine (COMPAZINE) 10 MG tablet Take 1 tablet (10 mg total) by mouth every 6 (six) hours as needed (Nausea or vomiting). 30 tablet 1   pyridOXINE (VITAMIN  B-6) 50 MG tablet Take 50 mg by mouth daily.      senna-docusate (SENOKOT-S) 8.6-50 MG tablet Take 2 tablets by mouth daily. 60 tablet 3   No current facility-administered medications for this visit.  Facility-Administered Medications Ordered in Other Visits  Medication Dose Route Frequency Provider Last Rate Last Dose   sodium chloride flush (NS) 0.9 % injection 10 mL  10 mL Intravenous PRN Earlie Server, MD   10 mL at 07/15/18 0847    PHYSICAL EXAMINATION: ECOG PERFORMANCE STATUS: 1 - Symptomatic but completely ambulatory Vitals:   08/17/18 0949  BP: 104/65  Pulse: 80  Resp: 18  Temp: (!) 96.9 F (36.1 C)  SpO2: 97%   Filed Weights   08/17/18 0949  Weight: 152 lb 4.8 oz (69.1 kg)   Physical Exam Constitutional:      Appearance: He is ill-appearing. He is not diaphoretic.  HENT:     Head: Normocephalic and atraumatic.     Mouth/Throat:     Pharynx: No oropharyngeal exudate.  Eyes:     General: No scleral icterus.    Conjunctiva/sclera: Conjunctivae normal.     Pupils: Pupils are equal, round, and reactive to light.  Neck:     Musculoskeletal: Normal range of motion and neck supple.     Thyroid: No thyromegaly.  Cardiovascular:     Rate and Rhythm: Normal rate and regular rhythm.     Heart sounds: Normal heart sounds. No murmur.  Pulmonary:     Effort: Pulmonary effort is normal. No respiratory distress.     Breath sounds: No wheezing.     Comments: Tachypneic Abdominal:     General: Bowel sounds are normal. There is no distension.     Palpations: Abdomen is soft. There is no mass.     Tenderness: There is no abdominal tenderness.  Musculoskeletal: Normal range of motion.        General: No deformity.  Skin:    General: Skin is warm and dry.     Coloration: Skin is pale.     Findings: No erythema or rash.  Neurological:     Mental Status: He is alert and oriented to person, place, and time.     Cranial Nerves: No cranial nerve deficit.     Coordination:  Coordination normal.  Psychiatric:        Behavior: Behavior normal.        Thought Content: Thought content normal.     Comments: Anxious    LABORATORY DATA:  I have reviewed the data as listed Lab Results  Component Value Date   WBC 8.0 08/17/2018   HGB 11.3 (L) 08/17/2018   HCT 33.2 (L) 08/17/2018   MCV 82.8 08/17/2018   PLT 170 08/17/2018   Recent Labs    07/29/18 0845  08/03/18 0823 08/09/18 1744 08/10/18 0429 08/17/18 0930  NA 129*   < > 134* 133* 134* 135  K 3.6   < > 3.7 4.4 4.1 4.5  CL 105   < > 110 102 103 106  CO2 17*   < > 17* 19* 21* 20*  GLUCOSE 133*   < > 105* 94 84 140*  BUN 11   < > 16 20 19  27*  CREATININE 1.02   < > 0.80 1.20 1.20 1.41*  CALCIUM 8.0*   < > 8.7* 9.7 8.9 9.3  GFRNONAA >60   < > >60 54* 54* 45*  GFRAA >60   < > >60 >60 >60 52*  PROT 6.4*  --  6.4*  --   --  7.2  ALBUMIN 2.9*  --  2.8*  --   --  3.4*  AST 35  --  33  --   --  30  ALT 22  --  26  --   --  30  ALKPHOS 35*  --  31*  --   --  42  BILITOT 0.5  --  0.5  --   --  0.8   < > = values in this interval not displayed.   RADIOGRAPHIC STUDIES: I have personally reviewed the radiological images as listed and agreed with the findings in the report. 12/15/2017 Bone scan whole body showed new sites of osseous tracer accumulation are identified especially to size at the distal left femoral diaphysis, L2 vertebral body, right iliac bone, right ischium. 01/18/2018 chest abdomen pelvis with contrast Extensive metastasis disease to bone, with all bone lesions new from the abdomen and pelvis CT dated 9/5 2018 and a chest CT dated 06/19/2013.  Involvement is similar to the recent bone scan.  No other evidence of metastatic disease.  No acute abnormalities in the chest abdomen and pelvis.  03/14/2018 CT chest Angio PE protocol 1. No acute cardiopulmonary abnormalities. No evidence for acute pulmonary embolus 2. Coronary artery atherosclerotic calcifications 3.  Aortic Atherosclerosis  (ICD10-I70.0). 4. Sclerotic bone metastasis as noted previously  03/25/2018 CT abdomen pelvis with contrast 1. No acute abnormality in the abdomen or pelvis. 2. Extensive metastatic bone disease. Findings are similar to theexam on 01/18/2018.  3. Nonobstructive left kidney stone. 4. Stable left adrenal nodule.  ASSESSMENT & PLAN: 85 y.o. male follows up for management of metastatic prostate cancer and neoplasm related symptoms..  1. Prostate cancer ([place])   2. Bone metastasis ([place])   3. Shortness of breath   4. Weight loss   5. Neoplasm related pain   6. AKI (acute kidney injury) ([place])    #Acute subsegmental PE/bilateral lower extremity DVT Continue chronic anticoagulation with Eliquis 5 mg twice daily. I have discussed his CT results with radiologist Dr. Margarette Canada. Due to his persistent and chronic shortness of breath, patient has had 2 CT chest angiograms done on 07/29/2018 and 08/09/2018. On 07/29/2018, multifocal groundglass opacities throughout the lungs with mentioned which was considered to be compatible with multifocal pneumonia.  Differential includes inflammatory changes such as pneumonitis. 08/09/2018, positive for PE.  Discussed with radiology.  Given the history of recent left rib cage radiation, the linear opacities throughout the left upper lobe now has appearance compatible with radiation changes.  The other groundglass opacity within the lung have resolved since 07/29/2018.  No findings suspicious for pneumonia.  Clinically his shortness of breath is disproportional to his image findings- small PE.  And SOB has been ongoing for the past couple of weeks.  He has tachypnea as well.  Pulse oximetry showed 98% on room air.   I asked him if there is an anxious component, he says" may be".  Consider adding anxiolytic agents. He supposed to take Remeron and Pamelor. Unclear if he is actually taking these.    Will refer to palliative care.  Can also consider pulmonology referral for  further evaluation.   #Severe dysphagia, patient has been referred by primary care provider to gastroenterology. Patient told me that he was not called about any appointment. In the system looks like he has an appointment next week.  I called [facility] clinic gastroenterology and get him an appointment this afternoon to be seen and evaluated.  #AKI, creatinine increased to 1.4.  Likely secondary to poor oral intake as well as swallowing difficulties. We will proceed with 1 L of IV fluid normal saline today. He is going  to have daily IV fluid session for 3 days. Repeat BMP in 3 days.  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 ADT through urology office.  Finished 6 cycles of Xofigo treatment.   Continue hold chemotherapy due to acute illness. His due for repeat Lupron treatment at urologist office.  He has an appointment with urology in April.  # Normocytic anemia, hemoglobin improved.   Orders Placed This Encounter  Procedures   Basic metabolic panel    Standing Status:   Future    Standing Expiration Date:   08/17/2019  Follow-up in 2 weeks.  Repeat CBC CMP  We spent sufficient time to discuss many aspect of care, questions were answered to patient's satisfaction. Total face to face encounter time for this patient visit was 40 min. >50% of the time was  spent in counseling and coordination of care.   Earlie Server, MD, PhD Hematology Oncology [facility] Pager- [phone] 08/17/18

## 2018-08-17 NOTE — Progress Notes (Signed)
Patient here for follow up. Pt feels very short of breath; pulse ox 98% on room air

## 2018-08-18 ENCOUNTER — Inpatient Hospital Stay: Payer: Medicare Other

## 2018-08-18 ENCOUNTER — Other Ambulatory Visit: Payer: Self-pay | Admitting: Gastroenterology

## 2018-08-18 ENCOUNTER — Ambulatory Visit
Admission: RE | Admit: 2018-08-18 | Discharge: 2018-08-18 | Disposition: A | Discharge status: Home / Self Care | Payer: Medicare Other | Source: Ambulatory Visit | Attending: Gastroenterology | Admitting: Gastroenterology

## 2018-08-18 ENCOUNTER — Other Ambulatory Visit: Payer: Self-pay

## 2018-08-18 DIAGNOSIS — R1310 Dysphagia, unspecified: Secondary | ICD-10-CM

## 2018-08-18 DIAGNOSIS — C61 Malignant neoplasm of prostate: Secondary | ICD-10-CM

## 2018-08-18 DIAGNOSIS — R634 Abnormal weight loss: Secondary | ICD-10-CM

## 2018-08-18 MED ORDER — SODIUM CHLORIDE 0.9 % IV SOLN
Freq: Once | INTRAVENOUS | Status: AC
  Administered 2018-08-18: 14:00:00 via INTRAVENOUS
  Filled 2018-08-18: qty 250

## 2018-08-18 MED ORDER — SODIUM CHLORIDE 0.9% FLUSH
10.0000 mL | Freq: Once | INTRAVENOUS | Status: AC | PRN
  Administered 2018-08-18: 10 mL
  Filled 2018-08-18: qty 10

## 2018-08-18 MED ORDER — HEPARIN SOD (PORK) LOCK FLUSH 100 UNIT/ML IV SOLN
INTRAVENOUS | Status: AC
  Filled 2018-08-18: qty 5

## 2018-08-18 MED ORDER — HEPARIN SOD (PORK) LOCK FLUSH 100 UNIT/ML IV SOLN
500.0000 [IU] | Freq: Once | INTRAVENOUS | Status: AC | PRN
  Administered 2018-08-18: 500 [IU]

## 2018-08-19 ENCOUNTER — Emergency Department
Admission: EM | Admit: 2018-08-19 | Discharge: 2018-08-19 | Disposition: A | Discharge status: Home / Self Care | Payer: Medicare Other | Attending: Emergency Medicine | Admitting: Emergency Medicine

## 2018-08-19 ENCOUNTER — Inpatient Hospital Stay (HOSPITAL_BASED_OUTPATIENT_CLINIC_OR_DEPARTMENT_OTHER): Payer: Medicare Other | Admitting: Hospice and Palliative Medicine

## 2018-08-19 ENCOUNTER — Inpatient Hospital Stay: Payer: Medicare Other

## 2018-08-19 ENCOUNTER — Encounter: Payer: Self-pay | Admitting: Emergency Medicine

## 2018-08-19 ENCOUNTER — Emergency Department: Payer: Medicare Other

## 2018-08-19 ENCOUNTER — Other Ambulatory Visit: Payer: Self-pay

## 2018-08-19 VITALS — BP 121/72 | HR 72 | Resp 20

## 2018-08-19 DIAGNOSIS — Y9389 Activity, other specified: Secondary | ICD-10-CM | POA: Insufficient documentation

## 2018-08-19 DIAGNOSIS — S0292XA Unspecified fracture of facial bones, initial encounter for closed fracture: Secondary | ICD-10-CM | POA: Insufficient documentation

## 2018-08-19 DIAGNOSIS — Z87891 Personal history of nicotine dependence: Secondary | ICD-10-CM | POA: Insufficient documentation

## 2018-08-19 DIAGNOSIS — N179 Acute kidney failure, unspecified: Secondary | ICD-10-CM

## 2018-08-19 DIAGNOSIS — I10 Essential (primary) hypertension: Secondary | ICD-10-CM | POA: Diagnosis not present

## 2018-08-19 DIAGNOSIS — R0602 Shortness of breath: Secondary | ICD-10-CM

## 2018-08-19 DIAGNOSIS — C7951 Secondary malignant neoplasm of bone: Secondary | ICD-10-CM | POA: Diagnosis not present

## 2018-08-19 DIAGNOSIS — W010XXA Fall on same level from slipping, tripping and stumbling without subsequent striking against object, initial encounter: Secondary | ICD-10-CM | POA: Insufficient documentation

## 2018-08-19 DIAGNOSIS — Y92512 Supermarket, store or market as the place of occurrence of the external cause: Secondary | ICD-10-CM | POA: Insufficient documentation

## 2018-08-19 DIAGNOSIS — Y999 Unspecified external cause status: Secondary | ICD-10-CM | POA: Insufficient documentation

## 2018-08-19 DIAGNOSIS — W19XXXA Unspecified fall, initial encounter: Secondary | ICD-10-CM

## 2018-08-19 DIAGNOSIS — C61 Malignant neoplasm of prostate: Secondary | ICD-10-CM

## 2018-08-19 DIAGNOSIS — Z7901 Long term (current) use of anticoagulants: Secondary | ICD-10-CM | POA: Diagnosis not present

## 2018-08-19 DIAGNOSIS — Z79899 Other long term (current) drug therapy: Secondary | ICD-10-CM | POA: Diagnosis not present

## 2018-08-19 DIAGNOSIS — Z7982 Long term (current) use of aspirin: Secondary | ICD-10-CM | POA: Diagnosis not present

## 2018-08-19 DIAGNOSIS — Z515 Encounter for palliative care: Secondary | ICD-10-CM | POA: Diagnosis not present

## 2018-08-19 DIAGNOSIS — G893 Neoplasm related pain (acute) (chronic): Secondary | ICD-10-CM | POA: Diagnosis not present

## 2018-08-19 DIAGNOSIS — I2510 Atherosclerotic heart disease of native coronary artery without angina pectoris: Secondary | ICD-10-CM | POA: Diagnosis not present

## 2018-08-19 DIAGNOSIS — S01511A Laceration without foreign body of lip, initial encounter: Secondary | ICD-10-CM | POA: Insufficient documentation

## 2018-08-19 DIAGNOSIS — Z8546 Personal history of malignant neoplasm of prostate: Secondary | ICD-10-CM | POA: Diagnosis not present

## 2018-08-19 DIAGNOSIS — Z23 Encounter for immunization: Secondary | ICD-10-CM | POA: Insufficient documentation

## 2018-08-19 DIAGNOSIS — S0181XA Laceration without foreign body of other part of head, initial encounter: Secondary | ICD-10-CM

## 2018-08-19 LAB — BASIC METABOLIC PANEL
Anion gap: 8 (ref 5–15)
BUN: 25 mg/dL — ABNORMAL HIGH (ref 8–23)
CO2: 17 mmol/L — ABNORMAL LOW (ref 22–32)
CREATININE: 1.29 mg/dL — AB (ref 0.61–1.24)
Calcium: 8.7 mg/dL — ABNORMAL LOW (ref 8.9–10.3)
Chloride: 109 mmol/L (ref 98–111)
GFR calc Af Amer: 58 mL/min — ABNORMAL LOW (ref >60)
GFR, EST NON AFRICAN AMERICAN: 50 mL/min — AB (ref >60)
Glucose, Bld: 100 mg/dL — ABNORMAL HIGH (ref 70–99)
Potassium: 4.2 mmol/L (ref 3.5–5.1)
SODIUM: 134 mmol/L — AB (ref 135–145)

## 2018-08-19 MED ORDER — TETANUS-DIPHTHERIA TOXOIDS TD 5-2 LFU IM INJ
0.5000 mL | INJECTION | Freq: Once | INTRAMUSCULAR | Status: AC
  Administered 2018-08-19: 0.5 mL via INTRAMUSCULAR
  Filled 2018-08-19: qty 0.5

## 2018-08-19 MED ORDER — OXYMETAZOLINE HCL 0.05 % NA SOLN
1.0000 | Freq: Once | NASAL | Status: AC
  Administered 2018-08-19: 1 via NASAL
  Filled 2018-08-19: qty 30

## 2018-08-19 MED ORDER — OXYMETAZOLINE HCL 0.05 % NA SOLN: 1.0000 | Freq: Once | NASAL | Status: DC

## 2018-08-19 MED ORDER — HEPARIN SOD (PORK) LOCK FLUSH 100 UNIT/ML IV SOLN
500.0000 [IU] | Freq: Once | INTRAVENOUS | Status: AC
  Administered 2018-08-19: 500 [IU] via INTRAVENOUS
  Filled 2018-08-19: qty 5

## 2018-08-19 MED ORDER — SODIUM CHLORIDE 0.9 % IV SOLN
Freq: Once | INTRAVENOUS | Status: AC
  Administered 2018-08-19: 13:00:00 via INTRAVENOUS
  Filled 2018-08-19: qty 250

## 2018-08-19 MED ORDER — CEPHALEXIN 500 MG PO CAPS
500.0000 mg | ORAL_CAPSULE | Freq: Once | ORAL | Status: AC
  Administered 2018-08-19: 500 mg via ORAL
  Filled 2018-08-19: qty 1

## 2018-08-19 MED ORDER — CEPHALEXIN 500 MG PO CAPS: 500.0000 mg | ORAL_CAPSULE | Freq: Two times a day (BID) | ORAL | 0 refills | Status: AC

## 2018-08-19 MED ORDER — LIDOCAINE HCL (PF) 1 % IJ SOLN
15.0000 mL | Freq: Once | INTRAMUSCULAR | Status: AC
  Administered 2018-08-19: 15 mL
  Filled 2018-08-19: qty 15

## 2018-08-19 MED ORDER — SODIUM CHLORIDE 0.9% FLUSH
10.0000 mL | INTRAVENOUS | Status: DC | PRN
  Administered 2018-08-19: 10 mL via INTRAVENOUS
  Filled 2018-08-19: qty 10

## 2018-08-19 NOTE — Discharge Instructions (Signed)
You have been seen in the emergency department after a fall.  You have suffered multiple fractures of the left side of your face.  Please call the number provided for ENT to arrange a follow-up procedure.  You have also suffered lacerations that have been repaired in the emergency department.  Sutures on your chin need to be removed in 7 to 10 days.  The sutures within your mouth do not need to be removed and are absorbable.  Your tetanus has been updated today in the emergency department.  Please return to the emergency department for any signs of infection such as increased pain redness pus or fever, or any other symptom personally concerning to yourself.  As we discussed please do not blow your nose for the next 2 weeks.

## 2018-08-19 NOTE — ED Notes (Signed)
Pharmacy called for TD vaccine.
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
## 2018-08-19 NOTE — Progress Notes (Signed)
[place]  Telephone:([phone] [phone]   Name: Matthew Hunter Date: 08/19/2018 MRN: [id number]  DOB: 25-Jul-1929  Patient Care Team: [place] Pink, MD as PCP - General (Family Medicine) Earlie Server, MD as Medical Oncologist (Medical Oncology)    REASON FOR CONSULTATION: Palliative Care consult requested for this 84 y.o. 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  Patient was hospitalized 07/29/2018-08/01/2018 with multifocal pneumonia.  He was hospitalized again 08/09/2018-08/11/2018 with shortness of breath.  Patient was found to have bilateral lower extremity DVTs.  CT angiogram showed subsegmental PE within the right middle lobe and right lower lobe.  Patient has had persistent dyspnea without evidence of hypoxia, persistent pain, and poor oral intake with weight loss.  He was referred to palliative care to help with symptom management and to address goals.   SOCIAL HISTORY:     reports that he quit smoking about 20 years ago. He has never used smokeless tobacco. He reports previous drug use. Frequency: 2.00 times per week. Drug: Marijuana. He reports that he does not drink alcohol.   Patient is married and lives at home with his wife.  They have 6 daughters and 1 son.  Patient used to work as a Administrator.  ADVANCE DIRECTIVES:  Does not have  CODE STATUS:   PAST MEDICAL HISTORY: Past Medical History:  Diagnosis Date   Arthritis    Benign enlargement of prostate    CAD (coronary artery disease)    Constipation    Elevated PSA    Family history of breast cancer    Family history of prostate cancer    GERD (gastroesophageal reflux disease)    Hypertension    Incomplete bladder emptying    Nocturia    Prostate cancer ([place]) 05/03/2017   Rad tx's and Lupron shots.    Urgency of micturation    Urinary  frequency     PAST SURGICAL HISTORY:  Past Surgical History:  Procedure Laterality Date   GALLBLADDER SURGERY     PORTA CATH INSERTION N/A 06/07/2018   Procedure: PORTA CATH INSERTION;  Surgeon: Katha Cabal, MD;  Location: [facility];  Service: Cardiovascular;  Laterality: N/A;   PROSTATE SURGERY     STOMACH SURGERY      HEMATOLOGY/ONCOLOGY HISTORY:  Oncology History   Matthew Hunter 86 y.o.  male with PMH listed as below who was referred by [facility] Urology Provider [name] to me for evaluation of clinical prostate cancer and management.  He has a history of elevated PSA and BPH. PSA trends as follows: 11/14/2014 4.3, 11/07/2015 7.1, 05/11/2016 10.4, 06/11/2016 10.6, 07/07/2016 7.8, 01/06/2017 86.8.  He was seen by urology and was diagnosed with clinical prostate cancer given the extreme elevation of PSA and prostate nodule. Biopsy was not pursued. He was treated with [name] on 01/29/2017,  # prostate biopsy on 03/10/2017. 5 out of 6 cores positive for prostate cancer, with highest gleason score group 9 (4+5), + perineural invasion.  Stage IV prostate cancer, multiple osseus metastatic disease. No visceral involvement.   01/18/2018  CT scan image was independently reviewed by me and discussed with patient.  Patient has progression of bone metastatic disease only  Current treatment  Patient got first [place] on 01/29/2018, switched to leupron on 04/13/2015,  Q6 months.  .Testosterone level at 14, castration level.  Upfront Gillermina Phy was added on December 2019 to  assist fast disease control (phase II trial [place] B, Lancet Oncol. 2014) S/p palliative RT to lumber spine and SI joints. Xtandi discontinued on due to CNS toxicity in April 2019.  12/09/17 Abiaterone discontinued.  7/ 18/2019 Start Apalutimide '240mg'$  daily.   INTERVAL HISTORY Patient presents to follow up for management of metastatic prostate cancer.  PSA 86.8 -->64.78 -->131--> 242 added Xtanti -->  17.74-->1.82 Xtandi stopped--> 1.79- (added Abiaterone)-> 11.06-->24.53--> 39.48 [Abiaterone stopped, Added Apalutimid]--> 65.71--> Xtandi discontinued on due to CNS toxicity in April 2019.  12/09/17 Abiaterone discontinued due to rising PSA and disease progression. 12/16/2017 Start Apalutimide '240mg'$  daily. Held on 01/13/2018 due to rising PSA, also causing patient to feel dizziness/black out.      Prostate cancer ([place])   05/03/2017 Initial Diagnosis    Prostate cancer ([place])    05/20/2018 -  Chemotherapy    The patient had DOCEtaxel (TAXOTERE) 70 mg in sodium chloride 0.9 % 150 mL chemo infusion, 36 mg/m2 = 70 mg, Intravenous,  Once, 5 of 9 cycles Administration: 70 mg (05/20/2018), 70 mg (05/27/2018), 70 mg (06/17/2018), 70 mg (07/01/2018), 70 mg (07/15/2018)  for chemotherapy treatment.      ALLERGIES:  has No Known Allergies.  MEDICATIONS:  Current Outpatient Medications  Medication Sig Dispense Refill   apixaban (ELIQUIS) 5 MG TABS tablet 10 mg po bid for 9 doses, then 5 mg po bid. 60 tablet 1   aspirin EC 81 MG tablet Take 81 mg by mouth daily.      Calcium Carb-Cholecalciferol (CALCIUM-VITAMIN D) 500-200 MG-UNIT tablet Take 1 tablet by mouth daily. 90 tablet 0   fentaNYL (DURAGESIC) 50 MCG/HR Place 10 patches onto the skin every 3 (three) days. 5 patch 0   finasteride (PROSCAR) 5 MG tablet Take 1 tablet (5 mg total) by mouth daily. 90 tablet 3   hydrocortisone 2.5 % cream APP EXT AA BID  0   megestrol (MEGACE) 40 MG tablet Take 2 tablets (80 mg total) by mouth 2 (two) times daily. 120 tablet 3   mirtazapine (REMERON) 15 MG tablet Take 15 mg by mouth at bedtime.  11   Multiple Vitamin (MULTIVITAMIN WITH MINERALS) TABS tablet Take 1 tablet by mouth daily. 30 tablet 0   nortriptyline (PAMELOR) 25 MG capsule Take 25 mg by mouth at bedtime. Reported on 05/16/2015  1   omeprazole (PRILOSEC) 20 MG capsule TAKE 1 CAPSULE(20 MG) BY MOUTH DAILY 30 capsule 4   ondansetron (ZOFRAN) 8  MG tablet Take 1 tablet (8 mg total) by mouth 2 (two) times daily as needed for refractory nausea / vomiting. 30 tablet 1   Oxycodone HCl 10 MG TABS Take 1 tablet (10 mg total) by mouth every 6 (six) hours as needed. 60 tablet 0   polyethylene glycol (MIRALAX / GLYCOLAX) packet Take 17 g by mouth daily. Mix one tablespoon with 8oz of your favorite juice or water every day until you are having soft formed stools. Then start taking once daily if you didn't have a stool the day before. 30 each 0   prochlorperazine (COMPAZINE) 10 MG tablet Take 1 tablet (10 mg total) by mouth every 6 (six) hours as needed (Nausea or vomiting). 30 tablet 1   pyridOXINE (VITAMIN B-6) 50 MG tablet Take 50 mg by mouth daily.      senna-docusate (SENOKOT-S) 8.6-50 MG tablet Take 2 tablets by mouth daily. 60 tablet 3   No current facility-administered medications for this visit.    Facility-Administered Medications Ordered in Other  Visits  Medication Dose Route Frequency Provider Last Rate Last Dose   0.9 %  sodium chloride infusion   Intravenous Once Earlie Server, MD       sodium chloride flush (NS) 0.9 % injection 10 mL  10 mL Intravenous PRN Earlie Server, MD   10 mL at 07/15/18 0847    VITAL SIGNS: There were no vitals taken for this visit. There were no vitals filed for this visit.  Estimated body mass index is 22.49 kg/m as calculated from the following:   Height as of 08/09/18: '5\' 9"'$  (1.753 m).   Weight as of 08/17/18: 152 lb 4.8 oz (69.1 kg).  LABS: CBC:    Component Value Date/Time   WBC 8.0 08/17/2018 0930   HGB 11.3 (L) 08/17/2018 0930   HGB 14.3 07/12/2011 1523   HCT 33.2 (L) 08/17/2018 0930   HCT 43.1 07/12/2011 1523   PLT 170 08/17/2018 0930   PLT 241 07/12/2011 1523   MCV 82.8 08/17/2018 0930   MCV 88 07/12/2011 1523   NEUTROABS 5.6 08/17/2018 0930   LYMPHSABS 1.3 08/17/2018 0930   MONOABS 0.7 08/17/2018 0930   EOSABS 0.2 08/17/2018 0930   BASOSABS 0.0 08/17/2018 0930   Comprehensive  Metabolic Panel:    Component Value Date/Time   NA 135 08/17/2018 0930   NA 144 07/12/2011 1523   K 4.5 08/17/2018 0930   K 4.1 07/12/2011 1523   CL 106 08/17/2018 0930   CL 104 07/12/2011 1523   CO2 20 (L) 08/17/2018 0930   CO2 30 07/12/2011 1523   BUN 27 (H) 08/17/2018 0930   BUN 11 01/14/2017 0837   BUN 15 07/12/2011 1523   CREATININE 1.41 (H) 08/17/2018 0930   CREATININE 1.06 07/12/2011 1523   GLUCOSE 140 (H) 08/17/2018 0930   GLUCOSE 89 07/12/2011 1523   CALCIUM 9.3 08/17/2018 0930   CALCIUM 9.2 07/12/2011 1523   AST 30 08/17/2018 0930   AST 28 07/12/2011 1523   ALT 30 08/17/2018 0930   ALT 34 07/12/2011 1523   ALKPHOS 42 08/17/2018 0930   ALKPHOS 45 (L) 07/12/2011 1523   BILITOT 0.8 08/17/2018 0930   BILITOT 1.5 (H) 07/12/2011 1523   PROT 7.2 08/17/2018 0930   PROT 8.5 (H) 07/12/2011 1523   ALBUMIN 3.4 (L) 08/17/2018 0930   ALBUMIN 4.0 07/12/2011 1523    RADIOGRAPHIC STUDIES: Dg Chest 2 View  Result Date: 08/09/2018 CLINICAL DATA:  Shortness of breath EXAM: CHEST - 2 VIEW COMPARISON:  Chest radiograph 08/03/2018, chest CT 07/29/2018 FINDINGS: Normal heart size. Right chest wall Port-A-Cath tip is in the right atrium. Areas of mild opacity within the left lung are unchanged. No new area of consolidation. No pleural effusion or pulmonary edema. Gas-filled colon interposed anterior to the liver beneath right hemidiaphragm. Multifocal osseous metastatic disease. IMPRESSION: Unchanged left mid lung opacities. Electronically Signed   By: Ulyses Jarred M.D.   On: 08/09/2018 18:20   Dg Chest 2 View  Result Date: 08/03/2018 CLINICAL DATA:  Multifocal pneumonia. EXAM: CHEST - 2 VIEW COMPARISON:  Chest x-ray dated 07/22/2018 and chest CTs dated 07/29/2018 and 07/04/2018 FINDINGS: There is slight residual infiltrate in the left midzone. No other discrete pulmonary infiltrates. Multiple sclerotic bone metastases are noted. Multiple old healed left lateral rib fractures. Power port in  place with the tip in the right atrium, unchanged. Heart size and vascularity are normal. Tortuosity and calcification of the thoracic aorta. No effusions. IMPRESSION: Minimal residual hazy infiltrate in the left midzone.  Unchanged osseous metastases. Aortic Atherosclerosis (ICD10-I70.0). Electronically Signed   By: Lorriane Shire M.D.   On: 08/03/2018 15:28   Dg Chest 2 View  Result Date: 07/22/2018 CLINICAL DATA:  Nasal congestion. EXAM: CHEST - 2 VIEW COMPARISON:  CT 07/04/2018. FINDINGS: PowerPort catheter noted with tip over cavoatrial junction. Heart size normal. No focal alveolar infiltrate. No pleural effusion or pneumothorax. Deformity noted of the left chest wall unchanged. Sclerotic bony metastatic disease best identified by prior CT. IMPRESSION: 1.  PowerPort catheter with tip over cavoatrial junction. 2. Stable left base atelectasis and or scarring. No new infiltrate noted. Reference is made to prior CT report for discussion of pulmonary nodules present. 3.  Sclerotic bony metastatic disease best identified by prior CT. Electronically Signed   By: Marcello Moores  Register   On: 07/22/2018 11:49   Ct Angio Chest Pe W And/or Wo Contrast  Addendum Date: 08/10/2018   ADDENDUM REPORT: 08/10/2018 12:34 ADDENDUM: The linear opacity throughout the LEFT UPPER lobe now has the appearance compatible with radiation changes. The other ground-glass opacities within the lungs have resolved since 07/29/2018. There are no findings suspicious for pneumonia. Electronically Signed   By: Margarette Canada M.D.   On: 08/10/2018 12:34   Result Date: 08/10/2018 CLINICAL DATA:  85 year old male with acute shortness of breath. History of prostate cancer. EXAM: CT ANGIOGRAPHY CHEST WITH CONTRAST TECHNIQUE: Multidetector CT imaging of the chest was performed using the standard protocol during bolus administration of intravenous contrast. Multiplanar CT image reconstructions and MIPs were obtained to evaluate the vascular anatomy.  CONTRAST:  81m OMNIPAQUE IOHEXOL 350 MG/ML SOLN COMPARISON:  None. FINDINGS: Cardiovascular: This is a technically satisfactory study. A subsegmental pulmonary embolus within the RIGHT middle lobe (series 5: Images 163-168) and within the RIGHT LOWER lobe (5:143-149) noted. Mild cardiomegaly noted without CT evidence of RIGHT heart strain. Coronary artery atherosclerotic calcifications noted. No evidence of thoracic aortic aneurysm or pericardial effusion. Mediastinum/Nodes: No enlarged mediastinal, hilar, or axillary lymph nodes. Thyroid gland, trachea, and esophagus demonstrate no significant findings. Lungs/Pleura: Unchanged linear ground-glass opacity within the LEFT UPPER lobe noted. No airspace disease, consolidation, mass, pleural effusion or pneumothorax. Upper Abdomen: No acute abnormality Musculoskeletal: Sclerotic lesions within the visualized bones again noted compatible with metastatic disease. No new or acute bony abnormalities are identified. Review of the MIP images confirms the above findings. IMPRESSION: 1. Subsegmental pulmonary embolus within the RIGHT middle lobe and within the RIGHT LOWER lobe. 2. Diffuse sclerotic bony metastases again noted. 3. Unchanged linear ground-glass opacity within the LEFT UPPER lobe, nonspecific and unchanged from 07/29/2018. 4. Mild cardiomegaly and coronary artery disease. Critical Value/emergent results were called by telephone at the time of interpretation on 08/09/2018 at 7:31 pm to Dr. JCharlotte Crumb, who verbally acknowledged these results. Electronically Signed: By: JMargarette CanadaM.D. On: 08/09/2018 19:31   Ct Angio Chest Pe W Or Wo Contrast  Result Date: 07/29/2018 CLINICAL DATA:  85year old male with a history of shortness of breath for 2-3 months EXAM: CT ANGIOGRAPHY CHEST WITH CONTRAST TECHNIQUE: Multidetector CT imaging of the chest was performed using the standard protocol during bolus administration of intravenous contrast. Multiplanar CT image  reconstructions and MIPs were obtained to evaluate the vascular anatomy. CONTRAST:  761mISOVUE-370 IOPAMIDOL (ISOVUE-370) INJECTION 76% COMPARISON:  07/04/2018 FINDINGS: Cardiovascular: Heart: No cardiomegaly. No pericardial fluid/thickening. Calcifications of left main, left anterior descending coronary arteries. Aorta: Unremarkable course, caliber, contour of the thoracic aorta. No aneurysm or dissection flap. No periaortic fluid.  Minimal atherosclerotic changes. Pulmonary arteries: No central, lobar, segmental, or proximal subsegmental filling defects. Unchanged right port catheter. Mediastinum/Nodes: No mediastinal adenopathy. Unremarkable appearance of the thoracic esophagus. Unremarkable appearance of the thoracic inlet and thyroid. Lungs/Pleura: New ground-glass opacity of the left upper lobe with scattered regions of ground-glass opacity of the right upper lobe and bilateral lower lobes. These changes are new from the comparison CT. No pleural effusion. No confluent airspace disease. No pneumothorax. Upper Abdomen: No acute finding of the upper abdomen Musculoskeletal: No acute displaced fracture. Similar pattern multiple sclerotic lesions of the appendicular and axial skeleton, including: Right and left scapula, right clavicle, bilateral aspect of the manubrium, T1, T2, T3, T4, T5, T6, T8, T9, T10, T11, T12, L1, bilateral ribs. Sclerotic changes within the rib deformities of the left 2-6 favored to represent metastases superimposed on remodeled fracture. Review of the MIP images confirms the above findings. IMPRESSION: CT negative for pulmonary emboli. New multifocal ground-glass opacity throughout the lungs, compatible with multifocal pneumonia. Differential would also include inflammatory changes such as pneumonitis, including toxic/inhalation exposure or hypersensitivity pneumonitis. Coronary artery disease and mild atherosclerosis. Redemonstration of skeletal metastases. Electronically Signed   By:  Corrie Mckusick D.O.   On: 07/29/2018 13:28   US Venous Img Lower Bilateral  Result Date: 08/10/2018 CLINICAL DATA:  85 year old male with pulmonary embolism. Evaluate for residual clot burden EXAM: BILATERAL LOWER EXTREMITY VENOUS DOPPLER ULTRASOUND TECHNIQUE: Gray-scale sonography with graded compression, as well as color Doppler and duplex ultrasound were performed to evaluate the lower extremity deep venous systems from the level of the common femoral vein and including the common femoral, femoral, profunda femoral, popliteal and calf veins including the posterior tibial, peroneal and gastrocnemius veins when visible. The superficial great saphenous vein was also interrogated. Spectral Doppler was utilized to evaluate flow at rest and with distal augmentation maneuvers in the common femoral, femoral and popliteal veins. COMPARISON:  None. FINDINGS: RIGHT LOWER EXTREMITY Common Femoral Vein: No evidence of thrombus. Normal compressibility, respiratory phasicity and response to augmentation. Saphenofemoral Junction: No evidence of thrombus. Normal compressibility and flow on color Doppler imaging. Profunda Femoral Vein: No evidence of thrombus. Normal compressibility and flow on color Doppler imaging. Femoral Vein: No evidence of thrombus. Normal compressibility, respiratory phasicity and response to augmentation. Popliteal Vein: No evidence of thrombus. Normal compressibility, respiratory phasicity and response to augmentation. Calf Veins: Normal appearance of the peroneal veins. However, the posterior tibial veins demonstrate no evidence of color flow on color Doppler imaging and are noncompressible consistent with acute occlusive DVT. Superficial Great Saphenous Vein: No evidence of thrombus. Normal compressibility. Venous Reflux:  None. Other Findings:  None. LEFT LOWER EXTREMITY Common Femoral Vein: No evidence of thrombus. Normal compressibility, respiratory phasicity and response to augmentation.  Saphenofemoral Junction: No evidence of thrombus. Normal compressibility and flow on color Doppler imaging. Profunda Femoral Vein: No evidence of thrombus. Normal compressibility and flow on color Doppler imaging. Femoral Vein: No evidence of thrombus. Normal compressibility, respiratory phasicity and response to augmentation. Popliteal Vein: No evidence of thrombus. Normal compressibility, respiratory phasicity and response to augmentation. Calf Veins: Normal flow in the posterior tibial veins. Abnormal appearance of the peroneal veins. The peroneal veins are not compressible in the lumen filled with low-level internal echoes consistent with acute occlusive DVT. Superficial Great Saphenous Vein: No evidence of thrombus. Normal compressibility. Venous Reflux:  None. Other Findings:  None. IMPRESSION: Positive for acute bilateral deep venous thrombosis isolated to the calf veins (posterior tibial veins on  the right, and peroneal veins on the left). Electronically Signed   By: Jacqulynn Cadet M.D.   On: 08/10/2018 14:17   Dg Esophagus W Double Cm (hd)  Result Date: 08/18/2018 CLINICAL DATA:  Dysphagia for 1 year.  Dysphagia with solids. EXAM: ESOPHOGRAM / BARIUM SWALLOW / BARIUM TABLET STUDY TECHNIQUE: Combined double contrast and single contrast examination performed using effervescent crystals, thick barium liquid, and thin barium liquid. The patient was observed with fluoroscopy swallowing a 13 mm barium sulphate tablet. FLUOROSCOPY TIME:  Fluoroscopy Time:  1 minutes 30 seconds Radiation Exposure Index (if provided by the fluoroscopic device): 10 mGy Number of Acquired Spot Images: 0 COMPARISON:  None. FINDINGS: There was normal pharyngeal anatomy and motility. Contrast flowed freely through the esophagus without evidence of mass. Mild smooth relative narrowing of the distal esophagus just proximal to the esophagogastric junction which does not restrict the passage of a barium tablet. Normal esophageal  mucosa without evidence of irregularity or ulceration. Tertiary contractions of the esophagus as can be seen with mild spasm. Mild gastroesophageal reflux. No definite hiatal hernia was demonstrated. At the end of the examination a 13 mm barium tablet was administered which transited through the esophagus and esophagogastric junction without delay. IMPRESSION: 1. Mild smooth relative narrowing of the distal esophagus just proximal to the esophagogastric junction which does not restrict the passage of a barium tablet. 2. Tertiary contractions of the esophagus as can be seen with mild spasm. Electronically Signed   By: Kathreen Devoid   On: 08/18/2018 10:40    PERFORMANCE STATUS (ECOG) : 1 - Symptomatic but completely ambulatory  REVIEW OF SYSTEMS: As noted above. Otherwise, a complete review of systems is negative.  PHYSICAL EXAM: General: NAD, frail appearing, thin Cardiovascular: regular rate and rhythm Pulmonary: clear ant fields Abdomen: soft, nontender, + bowel sounds GU: no suprapubic tenderness Extremities: no edema, no joint deformities Skin: no rashes Neurological: Weakness but otherwise nonfocal  IMPRESSION: I met with patient and his wife today while he was receiving an infusion.  I introduced palliative care services and attempted to establish therapeutic rapport.  Patient says he feels generally lousy but does not elucidate on specifics.  He says there are bad days when he will be symptomatic with pain, nausea, and dyspnea and other days when he has no symptoms.  He seemed to be not an ideal historian.  He would provide me with information regarding how he was taking his medications and that his wife would contradict him and say that information was incorrect.  Wife says that patient is not taking medications routinely for pain or other symptoms.  He does have transdermal fentanyl patch applied for long-acting analgesia.  Appetite reportedly is poor.  Patient saw GI yesterday and it  sounds like he had a barium study.  Neither patient nor wife were able to tell me the results of the study nor the plan for follow-up.  Patient is being followed by our dietitian.  He is drinking ensures but not routinely.  I encouraged him to increase his oral supplements to at least 3 times a day and to focus on small, more frequent meals throughout the day, with a particular focus on high-protein and high calories.  Case discussed with Dr. Tasia Catchings.  Patient does not seem to be receiving much benefit from the docetaxel.  He has had disease progression on multiple other lines of treatment.  Patient is likely nearing last line treatment.  Will need to have a discussion with  him regarding his goals of treatment.  We will plan to follow-up with him when he is next in the clinic and hopefully be able to see him in an exam room to facilitate a private conversation.  PLAN: -Continue current scope of treatment -We will need to discuss ACP/CODE STATUS -We will plan to discuss goals at more in-depth next clinic visit   Patient expressed understanding and was in agreement with this plan. He also understands that He can call clinic at any time with any questions, concerns, or complaints.     Time Total: 30 minutes  Visit consisted of counseling and education dealing with the complex and emotionally intense issues of symptom management and palliative care in the setting of serious and potentially life-threatening illness.Greater than 50%  of this time was spent counseling and coordinating care related to the above assessment and plan.  Signed by: Altha Harm, PhD, NP-C [phone] (Work Cell)

## 2018-08-19 NOTE — ED Provider Notes (Addendum)
[facility] Emergency Department Provider Note  Time seen: 4:08 PM  I have reviewed the triage vital signs and the nursing notes.   HISTORY  Chief Complaint Fall   HPI Matthew Hunter is a 85 y.o. male with a past medical history of CAD, hypertension, gastric reflux, presents to the emergency department after a fall.  According to the patient he was returning a shopping cart, when he began going downhill the shopping cart went away from him and he fell onto his face.  Denies LOC.  Patient is currently on Eliquis.  Patient has a large laceration to his chin and intraorally as well as through his lip.  Patient has significant tenderness around the left face.  Denies any extremity injuries.  Denies any neck or back pain.   Past Medical History:  Diagnosis Date  . Arthritis   . Benign enlargement of prostate   . CAD (coronary artery disease)   . Constipation   . Elevated PSA   . Family history of breast cancer   . Family history of prostate cancer   . GERD (gastroesophageal reflux disease)   . Hypertension   . Incomplete bladder emptying   . Nocturia   . Prostate cancer ([place]) 05/03/2017   Rad tx's and Lupron shots.   . Urgency of micturation   . Urinary frequency     Patient Active Problem List   Diagnosis Date Noted  . Dyspnea   . Acute deep vein thrombosis (DVT) of calf muscle vein of both lower extremities   . Prostate cancer metastatic to bone ([place])   . Pulmonary embolism ([place]) 08/09/2018  . Protein-calorie malnutrition, severe 07/31/2018  . PNA (pneumonia) 07/29/2018  . Family history of breast cancer   . Family history of prostate cancer   . 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 Goals of care, counseling/discussion 12/17/2017  . Prostate cancer ([place]) 05/03/2017  . Arthritis 06/11/2016  . History of alcohol abuse 06/11/2016  . Hx of colonic polyp 06/11/2016  . Hypertension 06/11/2016  . Internal hemorrhoids 06/11/2016  . Elevated PSA  05/16/2015  . BPH (benign prostatic hyperplasia) 11/22/2014  . Incomplete bladder emptying 11/22/2014  . GERD (gastroesophageal reflux disease) 05/09/2008    Past Surgical History:  Procedure Laterality Date  . GALLBLADDER SURGERY    . PORTA CATH INSERTION N/A 06/07/2018   Procedure: PORTA CATH INSERTION;  Surgeon: Katha Cabal, MD;  Location: [place] [facility];  Service: Cardiovascular;  Laterality: N/A;  . PROSTATE SURGERY    . STOMACH SURGERY      Prior to Admission medications   Medication Sig Start Date End Date Taking? Authorizing Provider  apixaban (ELIQUIS) 5 MG TABS tablet 10 mg po bid for 9 doses, then 5 mg po bid. 08/11/18   Demetrios Loll, MD  aspirin EC 81 MG tablet Take 81 mg by mouth daily.     [provider]  Calcium Carb-Cholecalciferol (CALCIUM-VITAMIN D) 500-200 MG-UNIT tablet Take 1 tablet by mouth daily. 12/30/17   Earlie Server, MD  fentaNYL (DURAGESIC) 50 MCG/HR Place 10 patches onto the skin every 3 (three) days. 08/17/18   Earlie Server, MD  finasteride (PROSCAR) 5 MG tablet Take 1 tablet (5 mg total) by mouth daily. 06/11/16   [name] A, PA-C  hydrocortisone 2.5 % cream APP EXT AA BID 09/02/17   [provider]  megestrol (MEGACE) 40 MG tablet Take 2 tablets (80 mg total) by mouth 2 (two) times daily. 08/03/18   Tasia Catchings,  Talbert Cage, MD  mirtazapine (REMERON) 15 MG tablet Take 15 mg by mouth at bedtime. 03/31/18   [provider]  Multiple Vitamin (MULTIVITAMIN WITH MINERALS) TABS tablet Take 1 tablet by mouth daily. 08/02/18   Vaughan Basta, MD  nortriptyline (PAMELOR) 25 MG capsule Take 25 mg by mouth at bedtime. Reported on 05/16/2015 10/30/14   [provider]  omeprazole (PRILOSEC) 20 MG capsule TAKE 1 CAPSULE(20 MG) BY MOUTH DAILY 06/23/18   Earlie Server, MD  ondansetron (ZOFRAN) 8 MG tablet Take 1 tablet (8 mg total) by mouth 2 (two) times daily as needed for refractory nausea / vomiting. 05/12/18   Earlie Server, MD  Oxycodone HCl 10 MG  TABS Take 1 tablet (10 mg total) by mouth every 6 (six) hours as needed. 06/17/18   Earlie Server, MD  polyethylene glycol [facility] Floria Raveling) packet Take 17 g by mouth daily. Mix one tablespoon with 8oz of your favorite juice or water every day until you are having soft formed stools. Then start taking once daily if you didn't have a stool the day before. 02/26/18   Merlyn Lot, MD  prochlorperazine (COMPAZINE) 10 MG tablet Take 1 tablet (10 mg total) by mouth every 6 (six) hours as needed (Nausea or vomiting). 05/12/18   Earlie Server, MD  pyridOXINE (VITAMIN B-6) 50 MG tablet Take 50 mg by mouth daily.     [provider]  senna-docusate (SENOKOT-S) 8.6-50 MG tablet Take 2 tablets by mouth daily. 04/12/18   Earlie Server, MD    No Known Allergies  Family History  Problem Relation Age of Onset  . Hypertension Mother   . Stroke Father   . Breast cancer Niece        dx  under 73  . Breast cancer Daughter 41  . Prostate cancer Son 74  . Kidney disease Neg Hx   . Kidney cancer Neg Hx   . Bladder Cancer Neg Hx     Social History Social History   Tobacco Use  . Smoking status: Former Smoker    Last attempt to quit: 06/01/1998    Years since quitting: 20.2  . Smokeless tobacco: Never Used  Substance Use Topics  . Alcohol use: No    Alcohol/week: 0.0 standard drinks  . Drug use: Not Currently    Frequency: 2.0 times per week    Types: Marijuana    Comment: marijuana two time monthly gives him an appetite    Review of Systems Constitutional: Negative for LOC. Eyes: Negative for visual complaints ENT: Laceration to mouth lip and face.  Pain to left face. Cardiovascular: Negative for chest pain. Respiratory: Negative for shortness of breath. Gastrointestinal: Negative for abdominal pain, vomiting Musculoskeletal: Negative for musculoskeletal complaints Neurological: Negative for headache All other ROS negative  ____________________________________________   PHYSICAL  EXAM:  VITAL SIGNS: ED Triage Vitals  Enc Vitals Group     BP 08/19/18 1543 (!) 160/84     Pulse Rate 08/19/18 1543 78     Resp 08/19/18 1543 (!) 22     Temp 08/19/18 1543 (!) 97.5 F (36.4 C)     Temp Source 08/19/18 1543 Axillary     SpO2 08/19/18 1543 99 %     Weight 08/19/18 1540 155 lb (70.3 kg)     Height 08/19/18 1540 5\' 9"  (1.753 m)     Head Circumference --      Peak Flow --      Pain Score --  Pain Loc --      Pain Edu? --      Excl. in [place]? --     Constitutional: Alert and oriented. Well appearing and in no distress. Eyes: Normal exam, EOMI. ENT   Head: Patient has approximate 3 cm laceration to his lower lip/chin, approximate 2 cm laceration through his lip and into mucosa and lip.   Nose: No congestion/rhinnorhea.   Mouth/Throat: Mucous membranes are moist. Cardiovascular: Normal rate, regular rhythm. Respiratory: Normal respiratory effort without tachypnea nor retractions. Breath sounds are clear  Gastrointestinal: Soft and nontender. No distention.  Musculoskeletal: Nontender with normal range of motion in all extremities.  Neurologic:  Normal speech and language. No gross focal neurologic deficits Skin: Patient has 2 lacerations one to the face one intraorally and onto the lip. Psychiatric: Mood and affect are normal.   ____________________________________________    RADIOLOGY  IMPRESSION: CT of the head: Chronic atrophic and ischemic changes without acute intracranial abnormality.  CT of the maxillofacial bones: Fractures involving anterior and posterolateral walls of the maxillary antrum as well as the inferior and lateral walls of the left orbit. Irregularity in the lateral orbital rim is noted as well suspicious for undisplaced fracture.  Associated left maxillary antrum changes are seen as described. No other significant fracture is noted.  CT of cervical spine: Changes consistent with multilevel degenerative change and findings  of prior prostate metastatic disease. No acute abnormality is noted.  ____________________________________________   INITIAL IMPRESSION / ASSESSMENT AND PLAN / ED COURSE  Pertinent labs & imaging results that were available during my care of the patient were reviewed by me and considered in my medical decision making (see chart for details).  Patient presents emergency department after mechanical fall.  Patient has 2 large lacerations 1 on his chin and one on his lip and intraorally.  Patient has moderate tenderness to palpation of the left face.  EOMI.  We obtain CT imaging of the head face and neck.  Patient will require laceration repair after CT imaging has been completed.  Patient denies LOC.  Has great range of motion in all extremities without tenderness elicited.  No C-spine T-spine or L-spine tenderness.  Patient CT scan shows several maxillofacial fractures however intracranial CT is negative, CT cervical is negative.  Patient will follow-up with ENT.  Patient has intact extraocular muscles, no visual disturbances, no signs of entrapment.  Lacerations have been repaired by myself.  Patient will require suture removal in 7 to 10 days for the external sutures, intraoral sutures are absorbable.  Discussed with the patient rinsing his mouth out several times per day with an over-the-counter antiseptic such as Listerine.  Discussed return precautions.  Will cover with antibiotics and update the patient's tetanus shot in the emergency department.   LACERATION REPAIR Performed by: Harvest Dark Authorized by: Harvest Dark Consent: Verbal consent obtained. Risks and benefits: risks, benefits and alternatives were discussed Consent given by: patient Patient identity confirmed: provided demographic data Prepped and Draped in normal sterile fashion Wound explored  Laceration Location: intraoral/lip  Laceration Length: 3cm  No Foreign Bodies seen or palpated  Anesthesia: local  infiltration  Local anesthetic: lidocaine 1% wo epinephrine  Anesthetic total: 4 ml  Irrigation method: syringe Amount of cleaning: standard  Skin closure: 5-0 rapid Vicryl  Number of sutures: 8  Technique: Simple interrupted  Patient tolerance: Patient tolerated the procedure well with no immediate complications.  LACERATION REPAIR Performed by: Harvest Dark Authorized by: Harvest Dark Consent: Verbal  consent obtained. Risks and benefits: risks, benefits and alternatives were discussed Consent given by: patient Patient identity confirmed: provided demographic data Prepped and Draped in normal sterile fashion Wound explored  Laceration Location: Chin/lower lip beneath the vermilion border  Laceration Length: 2 cm  No Foreign Bodies seen or palpated  Anesthesia: local infiltration  Local anesthetic: lidocaine 1 % without epinephrine  Anesthetic total: 4 ml  Irrigation method: syringe Amount of cleaning: standard  Skin closure: 5-0 nylon  Number of sutures: 3  Technique: Simple interrupted  Patient tolerance: Patient tolerated the procedure well with no immediate complications.  ____________________________________________   FINAL CLINICAL IMPRESSION(S) / ED DIAGNOSES  Fall Lacerations   Harvest Dark, MD 08/19/18 1801    Harvest Dark, MD 08/19/18 [phone]

## 2018-08-19 NOTE — ED Notes (Signed)
Returned to room with wheelchair for discharge and pt has scant blood dripping from left nare. MD paduchowski notified. Will give afrin and wait 5-10 minutes to make sure stops prior to discharge.

## 2018-08-19 NOTE — ED Notes (Signed)
MD at bedside to sutures

## 2018-08-23 ENCOUNTER — Telehealth: Payer: Self-pay | Admitting: Licensed Clinical Social Worker

## 2018-08-24 ENCOUNTER — Encounter: Payer: Self-pay | Admitting: Licensed Clinical Social Worker

## 2018-08-24 ENCOUNTER — Ambulatory Visit: Payer: Self-pay | Admitting: Licensed Clinical Social Worker

## 2018-08-24 DIAGNOSIS — Z1379 Encounter for other screening for genetic and chromosomal anomalies: Secondary | ICD-10-CM

## 2018-08-24 NOTE — Progress Notes (Addendum)
HPI:  Mr. Kochan was previously seen in the [place] clinic due to a personal and family history of cancer and concerns regarding a hereditary predisposition to cancer. Please refer to our prior cancer genetics clinic note for more information regarding our discussion, assessment and recommendations, at the time. Mr. Mounce recent genetic test results were disclosed to him, as were recommendations warranted by these results. These results and recommendations are discussed in more detail below.   Mr. Cotham is an 85 year-old man who has metastatic prostate cancer dx at 69.  Gleason 4+5=9.  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  He is here to discuss germline genetic testing to determine if this mutation is somatic only or could be a hereditary/germline BRCA1 mutation.   CANCER HISTORY:  Oncology History   Claudio Mondry 85 y.o.  male with PMH listed as below who was referred by [facility] Urology Provider [name] to me for evaluation of clinical prostate cancer and management.  He has a history of elevated PSA and BPH. PSA trends as follows: 11/14/2014 4.3, 11/07/2015 7.1, 05/11/2016 10.4, 06/11/2016 10.6, 07/07/2016 7.8, 01/06/2017 86.8.  He was seen by urology and was diagnosed with clinical prostate cancer given the extreme elevation of PSA and prostate nodule. Biopsy was not pursued. He was treated with [name] on 01/29/2017,  # prostate biopsy on 03/10/2017. 5 out of 6 cores positive for prostate cancer, with highest gleason score group 9 (4+5), + perineural invasion.  Stage IV prostate cancer, multiple osseus metastatic disease. No visceral involvement.   01/18/2018  CT scan image was independently reviewed by me and discussed with patient.  Patient has progression of bone metastatic disease only  Current treatment  Patient got first [place] on 01/29/2018, switched to leupron on 04/13/2015,  Q6 months.  .Testosterone level  at 14, castration level.  Upfront Gillermina Phy was added on December 2019 to assist fast disease control (phase II trial [place] B, [facility] Oncol. 2014) S/p palliative RT to lumber spine and SI joints. Xtandi discontinued on due to CNS toxicity in April 2019.  12/09/17 Abiaterone discontinued.  7/ 18/2019 Start Apalutimide '240mg'$  daily.   INTERVAL HISTORY Patient presents to follow up for management of metastatic prostate cancer.  PSA 86.8 -->64.78 -->131--> 242 added Xtanti --> 17.74-->1.82 Xtandi stopped--> 1.79- (added Abiaterone)-> 11.06-->24.53--> 39.48 [Abiaterone stopped, Added Apalutimid]--> 65.71--> Xtandi discontinued on due to CNS toxicity in April 2019.  12/09/17 Abiaterone discontinued due to rising PSA and disease progression. 12/16/2017 Start Apalutimide '240mg'$  daily. Held on 01/13/2018 due to rising PSA, also causing patient to feel dizziness/black out.      Prostate cancer ([place])   05/03/2017 Initial Diagnosis    Prostate cancer ([place])    05/20/2018 -  Chemotherapy    The patient had DOCEtaxel (TAXOTERE) 70 mg in sodium chloride 0.9 % 150 mL chemo infusion, 36 mg/m2 = 70 mg, Intravenous,  Once, 5 of 9 cycles Administration: 70 mg (05/20/2018), 70 mg (05/27/2018), 70 mg (06/17/2018), 70 mg (07/01/2018), 70 mg (07/15/2018)  for chemotherapy treatment.      FAMILY HISTORY:  We obtained a detailed, 4-generation family history.  Significant diagnoses are listed below: Family History  Problem Relation Age of Onset  . Hypertension Mother   . Stroke Father   . Breast cancer Niece        dx  under 4  . Breast cancer Daughter 1  . Prostate cancer Son 14  . Kidney disease Neg Hx   .  Kidney cancer Neg Hx   . Bladder Cancer Neg Hx    Mr. Ketcham has 6 daughters and 1 son. 1 of his daughters was dx with breast cancer at 23 and prostate cancer at 11.    He has 3 paternal half-brothers with no hx of cancer.  He also has 5 full sisters and 6 full brothers.  He does not know much about  their health, but is not aware of any cancer.  1 of his nieces (bother's daughter) was dx with breast cancer under the age of 90.   Mr. Shahan father: died at 16, unk health information.  Patient does not know/remember any other information about his paternal relatives.   Mr. Baumgarten mother: died at 72, no known hx of cancer.   The patient does now know/remember any information about his maternal relatives.   Mr. Inks is unaware of previous family history of genetic testing for hereditary cancer risks. Patient's maternal ancestors are of African American descent, and paternal ancestors are of African American  descent. There is no reported Ashkenazi Jewish ancestry. There is no known consanguinity.   GENETIC TEST RESULTS: Genetic testing reported out on 07/18/2018 through the [facility] Common Hereditary cancer panel found no pathogenic mutations.   The Common Hereditary Cancer Panel offered by [facility] includes sequencing and/or deletion duplication testing of the following 53 genes: APC, ATM, AXIN2, BARD1, BMPR1A, BRCA1, BRCA2, BRIP1, BUB1B, CDH1, CDK4, CDKN2A, CHEK2, CTNNA1, DICER1, ENG, EPCAM, GALNT12, GREM1, HOXB13, KIT, MEN1, MLH1, MLH3, MSH2, MSH3, MSH6, MUTYH, NBN, NF1, NTHL1, PALB2, PDGFRA, PMS2, POLD1, POLE, PTEN, RAD50, RAD51C, RAD51D, RNF43, RPS20, SDHA, SDHB, SDHC, SDHD, SMAD4, SMARCA4, STK11, TP53, TSC1, TSC2, VHL.  A variant was identified in either the PMS2 or PMS2CL gene. More blood was drawn for the lab to do additional studies on the PMS2 gene. This testing reported out on 08/23/2018 and did not reveal a mutation in the PMS2 gene.   The test reports have been scanned into EPIC and are located under the Molecular Pathology section of the Results Review tab.      We discussed with Mr. Kloosterman that because current genetic testing is not perfect, it is possible there may be a gene mutation in one of these genes that current testing cannot detect, but that chance is small.   We also discussed, that there could be another gene that has not yet been discovered, or that we have not yet tested, that is responsible for the cancer diagnoses in the family. It is also possible there is a hereditary cause for the cancer in the family that Mr. Ringler did not inherit and therefore was not identified in his testing.  Therefore, it is important to remain in touch with cancer genetics in the future so that we can continue to offer Mr. Asher the most up to date genetic testing.   Genetic testing did identify a variant of uncertain significance (VUS) was identified in the [facility] gene called c.2959C>T.  At this time, it is unknown if this variant is associated with increased cancer risk or if this is a normal finding, but most variants such as this get reclassified to being inconsequential. It should not be used to make medical management decisions. With time, we suspect the lab will determine the significance of this variant, if any. If we do learn more about it, we will try to contact Mr. Lincoln to discuss it further. However, it is important to stay in touch with [place] periodically and keep the  address and phone number up to date.  ADDITIONAL GENETIC TESTING: We discussed with Mr. Grzelak that his genetic testing was fairly extensive.  If there are genes identified to increase cancer risk that can be analyzed in the future, we would be happy to discuss and coordinate this testing at that time.    CANCER SCREENING RECOMMENDATIONS: Mr. Georgiou test result is considered negative (normal).  This means that we have not identified a hereditary cause for his personal and family history of cancer at this time. Most cancers happen by chance and this negative test suggests that his cancer may fall into this category.    While reassuring, this does not definitively rule out a hereditary predisposition to cancer. It is still possible that there could be genetic mutations that are undetectable by  current technology. There could be genetic mutations in genes that have not been tested or identified to increase cancer risk.  Therefore, it is recommended he continue to follow the cancer management and screening guidelines provided by his oncology and primary healthcare provider.   An individual's cancer risk and medical management are not determined by genetic test results alone. Overall cancer risk assessment incorporates additional factors, including personal medical history, family history, and any available genetic information that may result in a personalized plan for cancer prevention and surveillance  RECOMMENDATIONS FOR FAMILY MEMBERS:  Individuals in this family might be at some increased risk of developing cancer, over the general population risk, simply due to the family history of cancer.  We recommended women in this family have a yearly mammogram beginning at age 35, or 39 years younger than the earliest onset of cancer, an annual clinical breast exam, and perform monthly breast self-exams. Women in this family should also have a gynecological exam as recommended by their primary provider. All family members should have a colonoscopy by age 68.   It is also possible there is a hereditary cause for the cancer in Mr. Lassen family that he did not inherit and therefore was not identified in him.  Based on Mr. Peckham family history, we recommended his  Other relatives with cancer, have genetic counseling and testing. Mr. Glotfelty will let us know if we can be of any assistance in coordinating genetic counseling and/or testing for this family member.   FOLLOW-UP: Lastly, we discussed with Mr. Revolorio that cancer genetics is a rapidly advancing field and it is possible that new genetic tests will be appropriate for him and/or his family members in the future. We encouraged him to remain in contact with cancer genetics on an annual basis so we can update his personal and family histories  and let him know of advances in cancer genetics that may benefit this family.   Our contact number was provided. Mr. Demartin questions were answered to his satisfaction, and he knows he is welcome to call us at anytime with additional questions or concerns.   Faith Rogue, MS Genetic Counselor [email or web address] Phone: [phone]

## 2018-08-24 NOTE — Telephone Encounter (Signed)
Revealed negative genetic testing of the PMS2 gene.  This normal result is reassuring and indicates that it is unlikely Matthew Hunter's cancer is due to a hereditary cause.  It is unlikely that there is an increased risk of another cancer due to a mutation in one of these genes.  However, genetic testing is not perfect, and cannot definitively rule out a hereditary cause.  It will be important for him to keep in contact with genetics to learn if any additional testing may be needed in the future.

## 2018-08-30 ENCOUNTER — Other Ambulatory Visit: Payer: Self-pay

## 2018-08-31 ENCOUNTER — Telehealth: Payer: Self-pay

## 2018-08-31 ENCOUNTER — Inpatient Hospital Stay (HOSPITAL_BASED_OUTPATIENT_CLINIC_OR_DEPARTMENT_OTHER): Payer: Medicare Other | Admitting: Oncology

## 2018-08-31 ENCOUNTER — Other Ambulatory Visit: Payer: Self-pay

## 2018-08-31 ENCOUNTER — Inpatient Hospital Stay: Payer: Medicare Other | Attending: Oncology

## 2018-08-31 ENCOUNTER — Encounter: Payer: Self-pay | Admitting: Oncology

## 2018-08-31 ENCOUNTER — Inpatient Hospital Stay (HOSPITAL_BASED_OUTPATIENT_CLINIC_OR_DEPARTMENT_OTHER): Payer: Medicare Other | Admitting: Hospice and Palliative Medicine

## 2018-08-31 VITALS — BP 114/72 | HR 91 | Temp 96.4°F | Resp 18 | Wt 149.8 lb

## 2018-08-31 DIAGNOSIS — K5900 Constipation, unspecified: Secondary | ICD-10-CM

## 2018-08-31 DIAGNOSIS — I10 Essential (primary) hypertension: Secondary | ICD-10-CM | POA: Insufficient documentation

## 2018-08-31 DIAGNOSIS — Z9181 History of falling: Secondary | ICD-10-CM

## 2018-08-31 DIAGNOSIS — Z66 Do not resuscitate: Secondary | ICD-10-CM | POA: Insufficient documentation

## 2018-08-31 DIAGNOSIS — Z515 Encounter for palliative care: Secondary | ICD-10-CM | POA: Diagnosis not present

## 2018-08-31 DIAGNOSIS — K219 Gastro-esophageal reflux disease without esophagitis: Secondary | ICD-10-CM | POA: Insufficient documentation

## 2018-08-31 DIAGNOSIS — G893 Neoplasm related pain (acute) (chronic): Secondary | ICD-10-CM | POA: Diagnosis not present

## 2018-08-31 DIAGNOSIS — R4182 Altered mental status, unspecified: Secondary | ICD-10-CM

## 2018-08-31 DIAGNOSIS — R630 Anorexia: Secondary | ICD-10-CM | POA: Diagnosis not present

## 2018-08-31 DIAGNOSIS — J189 Pneumonia, unspecified organism: Secondary | ICD-10-CM

## 2018-08-31 DIAGNOSIS — Z79899 Other long term (current) drug therapy: Secondary | ICD-10-CM | POA: Insufficient documentation

## 2018-08-31 DIAGNOSIS — Z7901 Long term (current) use of anticoagulants: Secondary | ICD-10-CM | POA: Insufficient documentation

## 2018-08-31 DIAGNOSIS — Z7189 Other specified counseling: Secondary | ICD-10-CM

## 2018-08-31 DIAGNOSIS — C61 Malignant neoplasm of prostate: Secondary | ICD-10-CM | POA: Diagnosis present

## 2018-08-31 DIAGNOSIS — I824Z3 Acute embolism and thrombosis of unspecified deep veins of distal lower extremity, bilateral: Secondary | ICD-10-CM | POA: Insufficient documentation

## 2018-08-31 DIAGNOSIS — Z87891 Personal history of nicotine dependence: Secondary | ICD-10-CM | POA: Insufficient documentation

## 2018-08-31 DIAGNOSIS — R1310 Dysphagia, unspecified: Secondary | ICD-10-CM

## 2018-08-31 DIAGNOSIS — R634 Abnormal weight loss: Secondary | ICD-10-CM

## 2018-08-31 DIAGNOSIS — C7951 Secondary malignant neoplasm of bone: Secondary | ICD-10-CM

## 2018-08-31 DIAGNOSIS — N400 Enlarged prostate without lower urinary tract symptoms: Secondary | ICD-10-CM | POA: Diagnosis not present

## 2018-08-31 DIAGNOSIS — Z803 Family history of malignant neoplasm of breast: Secondary | ICD-10-CM

## 2018-08-31 DIAGNOSIS — I2510 Atherosclerotic heart disease of native coronary artery without angina pectoris: Secondary | ICD-10-CM

## 2018-08-31 DIAGNOSIS — I2693 Single subsegmental pulmonary embolism without acute cor pulmonale: Secondary | ICD-10-CM

## 2018-08-31 DIAGNOSIS — M1990 Unspecified osteoarthritis, unspecified site: Secondary | ICD-10-CM | POA: Diagnosis not present

## 2018-08-31 DIAGNOSIS — Z8042 Family history of malignant neoplasm of prostate: Secondary | ICD-10-CM

## 2018-08-31 DIAGNOSIS — Z923 Personal history of irradiation: Secondary | ICD-10-CM | POA: Insufficient documentation

## 2018-08-31 DIAGNOSIS — Z95828 Presence of other vascular implants and grafts: Secondary | ICD-10-CM

## 2018-08-31 DIAGNOSIS — R1319 Other dysphagia: Secondary | ICD-10-CM

## 2018-08-31 LAB — CBC WITH DIFFERENTIAL/PLATELET
Abs Immature Granulocytes: 0.43 10*3/uL — ABNORMAL HIGH (ref 0.00–0.07)
Basophils Absolute: 0 10*3/uL (ref 0.0–0.1)
Basophils Relative: 0 %
Eosinophils Absolute: 0 10*3/uL (ref 0.0–0.5)
Eosinophils Relative: 0 %
HCT: 27 % — ABNORMAL LOW (ref 39.0–52.0)
Hemoglobin: 9.2 g/dL — ABNORMAL LOW (ref 13.0–17.0)
Immature Granulocytes: 3 %
Lymphocytes Relative: 10 %
Lymphs Abs: 1.3 10*3/uL (ref 0.7–4.0)
MCH: 28 pg (ref 26.0–34.0)
MCHC: 34.1 g/dL (ref 30.0–36.0)
MCV: 82.1 fL (ref 80.0–100.0)
Monocytes Absolute: 0.7 10*3/uL (ref 0.1–1.0)
Monocytes Relative: 6 %
Neutro Abs: 10.2 10*3/uL — ABNORMAL HIGH (ref 1.7–7.7)
Neutrophils Relative %: 81 %
Platelets: 127 10*3/uL — ABNORMAL LOW (ref 150–400)
RBC: 3.29 MIL/uL — ABNORMAL LOW (ref 4.22–5.81)
RDW: 17.5 % — ABNORMAL HIGH (ref 11.5–15.5)
WBC: 12.7 10*3/uL — ABNORMAL HIGH (ref 4.0–10.5)
nRBC: 0 % (ref 0.0–0.2)

## 2018-08-31 LAB — SAMPLE TO BLOOD BANK

## 2018-08-31 NOTE — Progress Notes (Signed)
Patient here for follow up. Pt complains of constipation. He had a fall on 3/20 and was seen in ED. Per wife, pt has fracture to left Jaw. Requesting refill on finasteride.

## 2018-08-31 NOTE — Telephone Encounter (Signed)
Hospice referral faxed to [facility] care.

## 2018-08-31 NOTE — Progress Notes (Signed)
Hematology/Oncology  Follow up note [facility] [phone] [phone]  Patient Care Team: [place] Pink, MD as PCP - General (Family Medicine) Earlie Server, MD as Medical Oncologist (Medical Oncology)  REASON FOR VISIT Follow up for antineoplasm treatment of Prostate Cancer  HISTORY OF PRESENTING ILLNESS:  Matthew Hunter 85 y.o.  male with PMH listed as below who was referred by [facility] Provider [name] to me for evaluation of clinical prostate cancer and management.  He has a history of elevated PSA and BPH. PSA trends as follows: 11/14/2014 4.3, 11/07/2015 7.1, 05/11/2016 10.4, 06/11/2016 10.6, 07/07/2016 7.8, 01/06/2017 86.8.  He was seen by urology and was diagnosed with clinical prostate cancer given the extreme elevation of PSA and prostate nodule. Biopsy was not pursued. He was treated with [name] on 01/29/2017,  # prostate biopsy on 03/10/2017. 5 out of 6 cores positive for prostate cancer, with highest gleason score group 9 (4+5), + perineural invasion.  Stage IV prostate cancer, multiple osseus metastatic disease. No visceral involvement.   01/18/2018  CT scan image was independently reviewed by me and discussed with patient.  Patient has progression of bone metastatic disease only  Current treatment  Patient got first [place] on 01/29/2018, switched to leupron on 04/13/2015,  Q6 months.  .Testosterone level at 14, castration level.  Upfront Gillermina Phy was added on December 2019 to assist fast disease control (phase II trial [place] B, [facility] Oncol. 2014) S/p palliative RT to lumber spine and SI joints. Xtandi discontinued on due to CNS toxicity in April 2019.  12/09/17 Abiaterone discontinued.  7/ 18/2019 Start Apalutimide '240mg'$  daily.   Trend of tumor marker  PSA 86.8 -->64.78 -->131--> 242 added Xtanti --> 17.74-->1.82 Xtandi stopped due to CNS toxicity in April 2019. --> 1.79- (added Abiaterone)-> 11.06-->24.53--> 39.48  [Abiaterone stopped, Added Apalutimid]--> 65.71-->88.63--> Xofigo treatment--> 215--> Back on Xtandi reduced dose.   12/09/17 Abiaterone discontinued due to rising PSA and disease progression. 12/16/2017 Start Apalutimide '240mg'$  daily. Held on 01/13/2018 due to rising PSA, also causing patient to feel dizziness/black out.  Started Xofigo treatment on 02/23/2018. 02/28/2018, back on Xtandi reduced dose '120mg'$ . Xtandi stopped Dec.2020 due to   05/20/2018 Weekly Docetaxel 2 weeks on one week off.  Finish 6 cycles of Xofigo treatments on 07/13/2018. 1 unit of irradiated PRBC transfusion on 07/08/2018   #  admitted from 07/29/2018-08/01/2018 for multi focal pneumonia. Patient received IV antibiotics and azithromycin during his admission.  Symptoms slightly better and was discharged home for follow-up.  # Patient was admitted from 08/09/2018- 08/11/2018 due to shortness of breath. 08/09/2018 CT chest angiogram showed subsegmental pulmonary embolism within the right middle lobe and within the right lower lobe.  Again shown diffuse sclerotic bone metastasis.  Unchanged linear groundglass opacity within the left upper lobe nonspecific and unchanged from the study that was done 07/29/2018.  Mild cardiomegaly and coronary artery disease. Patient was started on anticoagulation.  Currently he is on Eliquis 5 mg twice daily. 08/10/2018, bilateral lower extremity venous ultrasound showed positive for acute bilateral deep vein thrombosis isolated to the calf veins.  INTERVAL HISTORY Patient presents to follow up for management of metastatic prostate cancer and cancer related symptoms management.  #Last visit he was referred to gastroenterology due to dysphagia and weight loss. Patient was seen by Dr. Gustavo Lah and patient had barium swallowing test which showed mild smooth relative narrowing of the distal esophagus just proximal to the esophagogastric junction which does not restrict the passage of barium  tablet.  Patient was  recommended to have EGD done which is not emergent.  #Patient continues to complain dysphagia, especially with solid food.  Wife reports that patient is eating very little, mostly soft food for example mashed potatoes.  #Weight loss, continue to have 3 pound weight loss since 2 weeks ago. Appetite continues to be very poor patient has Megace 40 mg twice daily.  Wife reports that patient is not taking medication when she gives medication to patient.  Per wife, patient has been more forgetful. Denies shortness of breath, cough, abdominal pain.  Patient is taking Eliquis 5 mg twice daily for pulmonary embolism. #Reports having constipation.  Not taking Senokot.  He takes MiraLAX as needed.  Last bowel movement was yesterday.  Reports stool has been very hard. #He also had a fall on 08/19/2018 and went to emergency room.  Had fractures involving anterior and postero lateral walls of the maxillary antrum as well as the inferior and lateral walls of the left orbit.   Review of Systems  Constitutional: Positive for appetite change, fatigue and unexpected weight change. Negative for chills and fever.  HENT:   Negative for hearing loss and voice change.   Eyes: Negative for eye problems and icterus.  Respiratory: Negative for chest tightness, cough and shortness of breath.   Cardiovascular: Negative for chest pain and leg swelling.  Gastrointestinal: Positive for constipation. Negative for abdominal distention, abdominal pain and diarrhea.       Dysphagia  Endocrine: Negative for hot flashes.  Genitourinary: Negative for difficulty urinating, dysuria and frequency.   Musculoskeletal: Negative for arthralgias and back pain.  Skin: Negative for itching and rash.  Neurological: Negative for light-headedness and numbness.  Hematological: Negative for adenopathy. Does not bruise/bleed easily.  Psychiatric/Behavioral: Negative for confusion.    MEDICAL HISTORY:  Past Medical History:  Diagnosis Date   . Arthritis   . Benign enlargement of prostate   . CAD (coronary artery disease)   . Constipation   . Elevated PSA   . Family history of breast cancer   . Family history of prostate cancer   . GERD (gastroesophageal reflux disease)   . Hypertension   . Incomplete bladder emptying   . Nocturia   . Prostate cancer ([place]) 05/03/2017   Rad tx's and Lupron shots.   . Urgency of micturation   . Urinary frequency     SURGICAL HISTORY: Past Surgical History:  Procedure Laterality Date  . GALLBLADDER SURGERY    . PORTA CATH INSERTION N/A 06/07/2018   Procedure: PORTA CATH INSERTION;  Surgeon: Katha Cabal, MD;  Location: [facility];  Service: Cardiovascular;  Laterality: N/A;  . PROSTATE SURGERY    . STOMACH SURGERY      SOCIAL HISTORY: Social History   Socioeconomic History  . Marital status: Married    Spouse name: Not on file  . Number of children: Not on file  . Years of education: Not on file  . Highest education level: Not on file  Occupational History  . Not on file  Social Needs  . Financial resource strain: Not on file  . Food insecurity:    Worry: Not on file    Inability: Not on file  . Transportation needs:    Medical: Not on file    Non-medical: Not on file  Tobacco Use  . Smoking status: Former Smoker    Last attempt to quit: 06/01/1998    Years since quitting: 20.2  . Smokeless tobacco:  Never Used  Substance and Sexual Activity  . Alcohol use: No    Alcohol/week: 0.0 standard drinks  . Drug use: Not Currently    Frequency: 2.0 times per week    Types: Marijuana    Comment: marijuana two time monthly gives him an appetite  . Sexual activity: Not on file  Lifestyle  . Physical activity:    Days per week: Not on file    Minutes per session: Not on file  . Stress: Not on file  Relationships  . Social connections:    Talks on phone: Not on file    Gets together: Not on file    Attends religious service: Not on file    Active member of  club or organization: Not on file    Attends meetings of clubs or organizations: Not on file    Relationship status: Not on file  . Intimate partner violence:    Fear of current or ex partner: Not on file    Emotionally abused: Not on file    Physically abused: Not on file    Forced sexual activity: Not on file  Other Topics Concern  . Not on file  Social History Narrative  . Not on file    FAMILY HISTORY: Family History  Problem Relation Age of Onset  . Hypertension Mother   . Stroke Father   . Breast cancer Niece        dx  under 25  . Breast cancer Daughter 99  . Prostate cancer Son 77  . Kidney disease Neg Hx   . Kidney cancer Neg Hx   . Bladder Cancer Neg Hx     ALLERGIES:  has No Known Allergies.  MEDICATIONS:  Current Outpatient Medications  Medication Sig Dispense Refill  . apixaban (ELIQUIS) 5 MG TABS tablet 10 mg po bid for 9 doses, then 5 mg po bid. 60 tablet 1  . Calcium Carb-Cholecalciferol (CALCIUM-VITAMIN D) 500-200 MG-UNIT tablet Take 1 tablet by mouth daily. 90 tablet 0  . fentaNYL (DURAGESIC) 50 MCG/HR Place 10 patches onto the skin every 3 (three) days. 5 patch 0  . finasteride (PROSCAR) 5 MG tablet Take 1 tablet (5 mg total) by mouth daily. 90 tablet 3  . hydrocortisone 2.5 % cream APP EXT AA BID  0  . megestrol (MEGACE) 40 MG tablet Take 2 tablets (80 mg total) by mouth 2 (two) times daily. 120 tablet 3  . mirtazapine (REMERON) 15 MG tablet Take 15 mg by mouth at bedtime.  11  . Multiple Vitamin (MULTIVITAMIN WITH MINERALS) TABS tablet Take 1 tablet by mouth daily. 30 tablet 0  . nortriptyline (PAMELOR) 25 MG capsule Take 25 mg by mouth at bedtime. Reported on 05/16/2015  1  . omeprazole (PRILOSEC) 20 MG capsule TAKE 1 CAPSULE(20 MG) BY MOUTH DAILY (Patient taking differently: Take 40 mg by mouth daily. ) 30 capsule 4  . ondansetron (ZOFRAN) 8 MG tablet Take 1 tablet (8 mg total) by mouth 2 (two) times daily as needed for refractory nausea / vomiting.  30 tablet 1  . Oxycodone HCl 10 MG TABS Take 1 tablet (10 mg total) by mouth every 6 (six) hours as needed. 60 tablet 0  . prochlorperazine (COMPAZINE) 10 MG tablet Take 1 tablet (10 mg total) by mouth every 6 (six) hours as needed (Nausea or vomiting). 30 tablet 1  . pyridOXINE (VITAMIN B-6) 50 MG tablet Take 50 mg by mouth daily.     Marland Kitchen senna-docusate (SENOKOT-S) 8.6-50  MG tablet Take 2 tablets by mouth daily. 60 tablet 3   No current facility-administered medications for this visit.    Facility-Administered Medications Ordered in Other Visits  Medication Dose Route Frequency Provider Last Rate Last Dose  . sodium chloride flush (NS) 0.9 % injection 10 mL  10 mL Intravenous PRN Earlie Server, MD   10 mL at 07/15/18 0847    PHYSICAL EXAMINATION: ECOG PERFORMANCE STATUS: 1 - Symptomatic but completely ambulatory Vitals:   08/31/18 0944  BP: 114/72  Pulse: 91  Resp: 18  Temp: (!) 96.4 F (35.8 C)   Filed Weights   08/31/18 0944  Weight: 149 lb 12.8 oz (67.9 kg)   Physical Exam Constitutional:      General: He is not in acute distress.    Appearance: He is ill-appearing. He is not diaphoretic.  HENT:     Head: Normocephalic and atraumatic.     Mouth/Throat:     Pharynx: No oropharyngeal exudate.  Eyes:     General: No scleral icterus.    Conjunctiva/sclera: Conjunctivae normal.     Pupils: Pupils are equal, round, and reactive to light.  Neck:     Musculoskeletal: Normal range of motion and neck supple.     Thyroid: No thyromegaly.  Cardiovascular:     Rate and Rhythm: Normal rate and regular rhythm.     Heart sounds: Normal heart sounds. No murmur.  Pulmonary:     Effort: Pulmonary effort is normal. No respiratory distress.     Breath sounds: No wheezing.  Abdominal:     General: Bowel sounds are normal. There is no distension.     Palpations: Abdomen is soft. There is no mass.     Tenderness: There is no abdominal tenderness.  Musculoskeletal: Normal range of motion.         General: No deformity.  Skin:    General: Skin is warm and dry.     Coloration: Skin is pale.     Findings: No erythema or rash.  Neurological:     Mental Status: He is alert.     Cranial Nerves: No cranial nerve deficit.     Coordination: Coordination normal.     Comments: Oriented to his name and date of birth. Cannot tell the year and months.    Psychiatric:        Behavior: Behavior normal.        Thought Content: Thought content normal.     Comments: Anxious    LABORATORY DATA:  I have reviewed the data as listed Lab Results  Component Value Date   WBC 12.7 (H) 08/31/2018   HGB 9.2 (L) 08/31/2018   HCT 27.0 (L) 08/31/2018   MCV 82.1 08/31/2018   PLT 127 (L) 08/31/2018   Recent Labs    07/29/18 0845  08/03/18 0823  08/10/18 0429 08/17/18 0930 08/19/18 1248  NA 129*   < > 134*   < > 134* 135 134*  K 3.6   < > 3.7   < > 4.1 4.5 4.2  CL 105   < > 110   < > 103 106 109  CO2 17*   < > 17*   < > 21* 20* 17*  GLUCOSE 133*   < > 105*   < > 84 140* 100*  BUN 11   < > 16   < > 19 27* 25*  CREATININE 1.02   < > 0.80   < > 1.20 1.41* 1.29*  CALCIUM 8.0*   < >  8.7*   < > 8.9 9.3 8.7*  GFRNONAA >60   < > >60   < > 54* 45* 50*  GFRAA >60   < > >60   < > >60 52* 58*  PROT 6.4*  --  6.4*  --   --  7.2  --   ALBUMIN 2.9*  --  2.8*  --   --  3.4*  --   AST 35  --  33  --   --  30  --   ALT 22  --  26  --   --  30  --   ALKPHOS 35*  --  31*  --   --  42  --   BILITOT 0.5  --  0.5  --   --  0.8  --    < > = values in this interval not displayed.   RADIOGRAPHIC STUDIES: I have personally reviewed the radiological images as listed and agreed with the findings in the report. 12/15/2017 Bone scan whole body showed new sites of osseous tracer accumulation are identified especially to size at the distal left femoral diaphysis, L2 vertebral body, right iliac bone, right ischium. 01/18/2018 chest abdomen pelvis with contrast Extensive metastasis disease to bone, with all bone  lesions new from the abdomen and pelvis CT dated 9/5 2018 and a chest CT dated 06/19/2013.  Involvement is similar to the recent bone scan.  No other evidence of metastatic disease.  No acute abnormalities in the chest abdomen and pelvis.  03/14/2018 CT chest Angio PE protocol 1. No acute cardiopulmonary abnormalities. No evidence for acute pulmonary embolus 2. Coronary artery atherosclerotic calcifications 3.  Aortic Atherosclerosis (ICD10-I70.0). 4. Sclerotic bone metastasis as noted previously  03/25/2018 CT abdomen pelvis with contrast 1. No acute abnormality in the abdomen or pelvis. 2. Extensive metastatic bone disease. Findings are similar to theexam on 01/18/2018.  3. Nonobstructive left kidney stone. 4. Stable left adrenal nodule.  ASSESSMENT & PLAN: 85 y.o. male follows up for management of metastatic prostate cancer and neoplasm related symptoms..  1. Prostate cancer ([place])   2. Neoplasm related pain   3. Port-A-Cath in place   4. Weight loss   5. Bone metastasis ([place])   6. Esophageal dysphagia   7. Goals of care, counseling/discussion    #Metastatic prostate cancer. PSA continues to rise, 480. Patient continues to decline with significant weight loss, poor appetite, decline of his mental status.  He is not orientated today to year and months.  Only be able to tell me his name and date of birth. Per family, patient is also not taking medication as instructed. He has somatic BRCA mutation, genetic testing negative for germline mutation. Olaparib has not been approved in somatic BRCA positive prostate cancer.  Previously we have discussed about compassionate use given data from phase 2 trials.  However patient has deteriorated since our discussion.   . Goals of care discussion gIven his poor performance status, progressive disease, failure after multiple lines of treatment including Zytiga, Xtandi, Xofigo, docetaxel, his overall prognosis is very poor.  Therefore recommend  comfort care and hospice.  Discussed about CODE STATUS.  Patient would not want to be resuscitated or have his life prolonged  Patient has established care with palliative care service.  Will ask palliative service to discuss with patient again today.  #Acute subsegmental PE/bilateral lower extremity DVT, tolerates chronic anticoagulation.  Continue Eliquis 5 mg twice daily. #Constipation, likely secondary to narcotic use.  Recommend patient  to take Senokot 2 tablets daily use MiraLAX as needed.  If constipation does not improve, he may use Senokot 2 tablets twice daily. #Pain related pain, continue fentanyl 50 MCG every 72 hours.  Oxycodone every 4-6 hours as needed.  Pain appears to be well controlled.  We spent sufficient time to discuss many aspect of care, questions were answered to patient's satisfaction. Total face to face encounter time for this patient visit was 40 min. >50% of the time was  spent in counseling and coordination of care.    Earlie Server, MD, PhD Hematology Oncology [facility] at [facility] Pager- [phone] 08/31/18

## 2018-08-31 NOTE — Progress Notes (Signed)
[place]  [phone] [phone]   Name: Matthew Hunter Date: 08/31/2018 MRN: [id number]  DOB: Jan 28, 1930  Patient Care Team: Matthew Pink, MD as PCP - General (Family Medicine) Matthew Server, MD as Medical Oncologist (Medical Oncology)    REASON FOR CONSULTATION: Palliative Care consult requested for this 85 y.o. 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  Patient was hospitalized 07/29/2018-08/01/2018 with multifocal pneumonia.  He was hospitalized again 08/09/2018-08/11/2018 with shortness of breath.  Patient was found to have bilateral lower extremity DVTs.  CT angiogram showed subsegmental PE within the right middle lobe and right lower lobe.  Patient has had persistent dyspnea without evidence of hypoxia, persistent pain, and poor oral intake with weight loss.  He was referred to palliative care to help with symptom management and to address goals.   SOCIAL HISTORY:     reports that he quit smoking about 20 years ago. He has never used smokeless tobacco. He reports previous drug use. Frequency: 2.00 times per week. Drug: Marijuana. He reports that he does not drink alcohol.   Patient is married and lives at home with his wife.  They have 6 daughters and 1 son.  Patient used to work as a Administrator.  ADVANCE DIRECTIVES:  Does not have  CODE STATUS: DNR (MOST form completed on 08/31/18)  PAST MEDICAL HISTORY: Past Medical History:  Diagnosis Date   Arthritis    Benign enlargement of prostate    CAD (coronary artery disease)    Constipation    Elevated PSA    Family history of breast cancer    Family history of prostate cancer    GERD (gastroesophageal reflux disease)    Hypertension    Incomplete bladder emptying    Nocturia    Prostate cancer ([place]) 05/03/2017   Rad tx's and Lupron shots.     Urgency of micturation    Urinary frequency     PAST SURGICAL HISTORY:  Past Surgical History:  Procedure Laterality Date   GALLBLADDER SURGERY     PORTA CATH INSERTION N/A 06/07/2018   Procedure: PORTA CATH INSERTION;  Surgeon: Katha Cabal, MD;  Location: [facility];  Service: Cardiovascular;  Laterality: N/A;   PROSTATE SURGERY     STOMACH SURGERY      HEMATOLOGY/ONCOLOGY HISTORY:  Oncology History   Matthew Hunter 85 y.o.  male with PMH listed as below who was referred by [facility] Urology Provider Matthew Hunter to me for evaluation of clinical prostate cancer and management.  He has a history of elevated PSA and BPH. PSA trends as follows: 11/14/2014 4.3, 11/07/2015 7.1, 05/11/2016 10.4, 06/11/2016 10.6, 07/07/2016 7.8, 01/06/2017 86.8.  He was seen by urology and was diagnosed with clinical prostate cancer given the extreme elevation of PSA and prostate nodule. Biopsy was not pursued. He was treated with [name] on 01/29/2017,  # prostate biopsy on 03/10/2017. 5 out of 6 cores positive for prostate cancer, with highest gleason score group 9 (4+5), + perineural invasion.  Stage IV prostate cancer, multiple osseus metastatic disease. No visceral involvement.   01/18/2018  CT scan image was independently reviewed by me and discussed with patient.  Patient has progression of bone metastatic disease only  Current treatment  Patient got first [place] on 01/29/2018, switched to leupron on 04/13/2015,  Q6 months.  .Testosterone level at 14, castration level.  Upfront Gillermina Phy was  added on December 2019 to assist fast disease control (phase II trial [place] B, Lancet Oncol. 2014) S/p palliative RT to lumber spine and SI joints. Xtandi discontinued on due to CNS toxicity in April 2019.  12/09/17 Abiaterone discontinued.  7/ 18/2019 Start Apalutimide '240mg'$  daily.   INTERVAL HISTORY Patient presents to follow up for management of metastatic prostate cancer.  PSA 86.8  -->64.78 -->131--> 242 added Xtanti --> 17.74-->1.82 Xtandi stopped--> 1.79- (added Abiaterone)-> 11.06-->24.53--> 39.48 [Abiaterone stopped, Added Apalutimid]--> 65.71--> Xtandi discontinued on due to CNS toxicity in April 2019.  12/09/17 Abiaterone discontinued due to rising PSA and disease progression. 12/16/2017 Start Apalutimide '240mg'$  daily. Held on 01/13/2018 due to rising PSA, also causing patient to feel dizziness/black out.      Prostate cancer ([place])   05/03/2017 Initial Diagnosis    Prostate cancer ([place])    05/20/2018 -  Chemotherapy    The patient had DOCEtaxel (TAXOTERE) 70 mg in sodium chloride 0.9 % 150 mL chemo infusion, 36 mg/m2 = 70 mg, Intravenous,  Once, 5 of 9 cycles Administration: 70 mg (05/20/2018), 70 mg (05/27/2018), 70 mg (06/17/2018), 70 mg (07/01/2018), 70 mg (07/15/2018)  for chemotherapy treatment.      ALLERGIES:  has No Known Allergies.  MEDICATIONS:  Current Outpatient Medications  Medication Sig Dispense Refill   apixaban (ELIQUIS) 5 MG TABS tablet 10 mg po bid for 9 doses, then 5 mg po bid. 60 tablet 1   Calcium Carb-Cholecalciferol (CALCIUM-VITAMIN D) 500-200 MG-UNIT tablet Take 1 tablet by mouth daily. 90 tablet 0   fentaNYL (DURAGESIC) 50 MCG/HR Place 10 patches onto the skin every 3 (three) days. 5 patch 0   finasteride (PROSCAR) 5 MG tablet Take 1 tablet (5 mg total) by mouth daily. 90 tablet 3   hydrocortisone 2.5 % cream APP EXT AA BID  0   megestrol (MEGACE) 40 MG tablet Take 2 tablets (80 mg total) by mouth 2 (two) times daily. 120 tablet 3   mirtazapine (REMERON) 15 MG tablet Take 15 mg by mouth at bedtime.  11   Multiple Vitamin (MULTIVITAMIN WITH MINERALS) TABS tablet Take 1 tablet by mouth daily. 30 tablet 0   nortriptyline (PAMELOR) 25 MG capsule Take 25 mg by mouth at bedtime. Reported on 05/16/2015  1   omeprazole (PRILOSEC) 20 MG capsule TAKE 1 CAPSULE(20 MG) BY MOUTH DAILY (Patient taking differently: Take 40 mg by mouth daily. )  30 capsule 4   ondansetron (ZOFRAN) 8 MG tablet Take 1 tablet (8 mg total) by mouth 2 (two) times daily as needed for refractory nausea / vomiting. 30 tablet 1   Oxycodone HCl 10 MG TABS Take 1 tablet (10 mg total) by mouth every 6 (six) hours as needed. 60 tablet 0   prochlorperazine (COMPAZINE) 10 MG tablet Take 1 tablet (10 mg total) by mouth every 6 (six) hours as needed (Nausea or vomiting). 30 tablet 1   pyridOXINE (VITAMIN B-6) 50 MG tablet Take 50 mg by mouth daily.      senna-docusate (SENOKOT-S) 8.6-50 MG tablet Take 2 tablets by mouth daily. 60 tablet 3   No current facility-administered medications for this visit.    Facility-Administered Medications Ordered in Other Visits  Medication Dose Route Frequency Provider Last Rate Last Dose   sodium chloride flush (NS) 0.9 % injection 10 mL  10 mL Intravenous PRN Matthew Server, MD   10 mL at 07/15/18 0847    VITAL SIGNS: There were no vitals taken for this visit. There were  no vitals filed for this visit.  Estimated body mass index is 22.12 kg/m as calculated from the following:   Height as of 08/19/18: '5\' 9"'$  (1.753 m).   Weight as of an earlier encounter on 08/31/18: 149 lb 12.8 oz (67.9 kg).  LABS: CBC:    Component Value Date/Time   WBC 12.7 (H) 08/31/2018 0933   HGB 9.2 (L) 08/31/2018 0933   HGB 14.3 07/12/2011 1523   HCT 27.0 (L) 08/31/2018 0933   HCT 43.1 07/12/2011 1523   PLT 127 (L) 08/31/2018 0933   PLT 241 07/12/2011 1523   MCV 82.1 08/31/2018 0933   MCV 88 07/12/2011 1523   NEUTROABS 10.2 (H) 08/31/2018 0933   LYMPHSABS 1.3 08/31/2018 0933   MONOABS 0.7 08/31/2018 0933   EOSABS 0.0 08/31/2018 0933   BASOSABS 0.0 08/31/2018 0933   Comprehensive Metabolic Panel:    Component Value Date/Time   NA 134 (L) 08/19/2018 1248   NA 144 07/12/2011 1523   K 4.2 08/19/2018 1248   K 4.1 07/12/2011 1523   CL 109 08/19/2018 1248   CL 104 07/12/2011 1523   CO2 17 (L) 08/19/2018 1248   CO2 30 07/12/2011 1523   BUN 25  (H) 08/19/2018 1248   BUN 11 01/14/2017 0837   BUN 15 07/12/2011 1523   CREATININE 1.29 (H) 08/19/2018 1248   CREATININE 1.06 07/12/2011 1523   GLUCOSE 100 (H) 08/19/2018 1248   GLUCOSE 89 07/12/2011 1523   CALCIUM 8.7 (L) 08/19/2018 1248   CALCIUM 9.2 07/12/2011 1523   AST 30 08/17/2018 0930   AST 28 07/12/2011 1523   ALT 30 08/17/2018 0930   ALT 34 07/12/2011 1523   ALKPHOS 42 08/17/2018 0930   ALKPHOS 45 (L) 07/12/2011 1523   BILITOT 0.8 08/17/2018 0930   BILITOT 1.5 (H) 07/12/2011 1523   PROT 7.2 08/17/2018 0930   PROT 8.5 (H) 07/12/2011 1523   ALBUMIN 3.4 (L) 08/17/2018 0930   ALBUMIN 4.0 07/12/2011 1523    RADIOGRAPHIC STUDIES: Dg Chest 2 View  Result Date: 08/09/2018 CLINICAL DATA:  Shortness of breath EXAM: CHEST - 2 VIEW COMPARISON:  Chest radiograph 08/03/2018, chest CT 07/29/2018 FINDINGS: Normal heart size. Right chest wall Port-A-Cath tip is in the right atrium. Areas of mild opacity within the left lung are unchanged. No new area of consolidation. No pleural effusion or pulmonary edema. Gas-filled colon interposed anterior to the liver beneath right hemidiaphragm. Multifocal osseous metastatic disease. IMPRESSION: Unchanged left mid lung opacities. Electronically Signed   By: Ulyses Jarred M.D.   On: 08/09/2018 18:20   Dg Chest 2 View  Result Date: 08/03/2018 CLINICAL DATA:  Multifocal pneumonia. EXAM: CHEST - 2 VIEW COMPARISON:  Chest x-ray dated 07/22/2018 and chest CTs dated 07/29/2018 and 07/04/2018 FINDINGS: There is slight residual infiltrate in the left midzone. No other discrete pulmonary infiltrates. Multiple sclerotic bone metastases are noted. Multiple old healed left lateral rib fractures. Power port in place with the tip in the right atrium, unchanged. Heart size and vascularity are normal. Tortuosity and calcification of the thoracic aorta. No effusions. IMPRESSION: Minimal residual hazy infiltrate in the left midzone. Unchanged osseous metastases. Aortic  Atherosclerosis (ICD10-I70.0). Electronically Signed   By: Lorriane Shire M.D.   On: 08/03/2018 15:28   Ct Head Wo Contrast  Result Date: 08/19/2018 CLINICAL DATA:  Recent trip and fall with pain, initial encounter EXAM: CT HEAD WITHOUT CONTRAST CT MAXILLOFACIAL WITHOUT CONTRAST CT CERVICAL SPINE WITHOUT CONTRAST TECHNIQUE: Multidetector CT imaging of the head,  cervical spine, and maxillofacial structures were performed using the standard protocol without intravenous contrast. Multiplanar CT image reconstructions of the cervical spine and maxillofacial structures were also generated. COMPARISON:  07/10/2016 FINDINGS: CT HEAD FINDINGS Brain: Chronic atrophic and ischemic changes are noted. No findings to suggest acute hemorrhage, acute infarction or space-occupying mass lesion are noted. Vascular: No hyperdense vessel or unexpected calcification. Skull: No acute fracture is seen. There is irregularity identified in the lateral orbital ridge on the left incompletely evaluated on this exam. Other: None CT MAXILLOFACIAL FINDINGS Osseous: Irregularity is noted in the lateral orbital ridge with mild displacement identified. Additionally there is a fracture through the lateral and inferior walls of the left orbit identified. Opacification of the left maxillary antrum is noted as well as fractures through the anterior and lateral walls of the maxillary antrum on the left. No nasal bone fractures are seen. No other definitive fracture is noted. Orbits: Orbits and their contents are within normal limits. No evidence of significant orbital herniation is noted. No muscular entrapment is seen. Sinuses: Paranasal sinuses demonstrate opacification predominately with hemorrhage within the left maxillary antrum. Remainder of the paranasal sinuses are well aerated. Soft tissues: Mild soft tissue swelling is noted over the left cheek and left orbital region consistent with the recent injury. No sizable hematoma is noted. CT  CERVICAL SPINE FINDINGS Alignment: Within normal limits. Skull base and vertebrae: 7 cervical segments are well visualized. Vertebral body height is well maintained. Multilevel disc space narrowing is noted from C3 to C7 with mild associated osteophytes. Facet hypertrophic changes are noted. No acute fracture or acute facet abnormality is noted. Endplate sclerosis is noted throughout the cervical spine although additional areas of sclerosis are noted similar to that seen on prior bone scan consistent with previous metastatic disease. Soft tissues and spinal canal: No specific soft tissue abnormality is noted. Upper chest: Within normal limits. Other: None IMPRESSION: CT of the head: Chronic atrophic and ischemic changes without acute intracranial abnormality. CT of the maxillofacial bones: Fractures involving anterior and posterolateral walls of the maxillary antrum as well as the inferior and lateral walls of the left orbit. Irregularity in the lateral orbital rim is noted as well suspicious for undisplaced fracture. Associated left maxillary antrum changes are seen as described. No other significant fracture is noted. CT of cervical spine: Changes consistent with multilevel degenerative change and findings of prior prostate metastatic disease. No acute abnormality is noted. Electronically Signed   By: Inez Catalina M.D.   On: 08/19/2018 16:54   Ct Angio Chest Pe W And/or Wo Contrast  Addendum Date: 08/10/2018   ADDENDUM REPORT: 08/10/2018 12:34 ADDENDUM: The linear opacity throughout the LEFT UPPER lobe now has the appearance compatible with radiation changes. The other ground-glass opacities within the lungs have resolved since 07/29/2018. There are no findings suspicious for pneumonia. Electronically Signed   By: Margarette Canada M.D.   On: 08/10/2018 12:34   Result Date: 08/10/2018 CLINICAL DATA:  85 year old male with acute shortness of breath. History of prostate cancer. EXAM: CT ANGIOGRAPHY CHEST WITH  CONTRAST TECHNIQUE: Multidetector CT imaging of the chest was performed using the standard protocol during bolus administration of intravenous contrast. Multiplanar CT image reconstructions and MIPs were obtained to evaluate the vascular anatomy. CONTRAST:  68m OMNIPAQUE IOHEXOL 350 MG/ML SOLN COMPARISON:  None. FINDINGS: Cardiovascular: This is a technically satisfactory study. A subsegmental pulmonary embolus within the RIGHT middle lobe (series 5: Images 163-168) and within the RIGHT LOWER lobe (5:143-149) noted.  Mild cardiomegaly noted without CT evidence of RIGHT heart strain. Coronary artery atherosclerotic calcifications noted. No evidence of thoracic aortic aneurysm or pericardial effusion. Mediastinum/Nodes: No enlarged mediastinal, hilar, or axillary lymph nodes. Thyroid gland, trachea, and esophagus demonstrate no significant findings. Lungs/Pleura: Unchanged linear ground-glass opacity within the LEFT UPPER lobe noted. No airspace disease, consolidation, mass, pleural effusion or pneumothorax. Upper Abdomen: No acute abnormality Musculoskeletal: Sclerotic lesions within the visualized bones again noted compatible with metastatic disease. No new or acute bony abnormalities are identified. Review of the MIP images confirms the above findings. IMPRESSION: 1. Subsegmental pulmonary embolus within the RIGHT middle lobe and within the RIGHT LOWER lobe. 2. Diffuse sclerotic bony metastases again noted. 3. Unchanged linear ground-glass opacity within the LEFT UPPER lobe, nonspecific and unchanged from 07/29/2018. 4. Mild cardiomegaly and coronary artery disease. Critical Value/emergent results were called by telephone at the time of interpretation on 08/09/2018 at 7:31 pm to Dr. Charlotte Crumb , who verbally acknowledged these results. Electronically Signed: By: Margarette Canada M.D. On: 08/09/2018 19:31   Ct Cervical Spine Wo Contrast  Result Date: 08/19/2018 CLINICAL DATA:  Recent trip and fall with pain,  initial encounter EXAM: CT HEAD WITHOUT CONTRAST CT MAXILLOFACIAL WITHOUT CONTRAST CT CERVICAL SPINE WITHOUT CONTRAST TECHNIQUE: Multidetector CT imaging of the head, cervical spine, and maxillofacial structures were performed using the standard protocol without intravenous contrast. Multiplanar CT image reconstructions of the cervical spine and maxillofacial structures were also generated. COMPARISON:  07/10/2016 FINDINGS: CT HEAD FINDINGS Brain: Chronic atrophic and ischemic changes are noted. No findings to suggest acute hemorrhage, acute infarction or space-occupying mass lesion are noted. Vascular: No hyperdense vessel or unexpected calcification. Skull: No acute fracture is seen. There is irregularity identified in the lateral orbital ridge on the left incompletely evaluated on this exam. Other: None CT MAXILLOFACIAL FINDINGS Osseous: Irregularity is noted in the lateral orbital ridge with mild displacement identified. Additionally there is a fracture through the lateral and inferior walls of the left orbit identified. Opacification of the left maxillary antrum is noted as well as fractures through the anterior and lateral walls of the maxillary antrum on the left. No nasal bone fractures are seen. No other definitive fracture is noted. Orbits: Orbits and their contents are within normal limits. No evidence of significant orbital herniation is noted. No muscular entrapment is seen. Sinuses: Paranasal sinuses demonstrate opacification predominately with hemorrhage within the left maxillary antrum. Remainder of the paranasal sinuses are well aerated. Soft tissues: Mild soft tissue swelling is noted over the left cheek and left orbital region consistent with the recent injury. No sizable hematoma is noted. CT CERVICAL SPINE FINDINGS Alignment: Within normal limits. Skull base and vertebrae: 7 cervical segments are well visualized. Vertebral body height is well maintained. Multilevel disc space narrowing is noted  from C3 to C7 with mild associated osteophytes. Facet hypertrophic changes are noted. No acute fracture or acute facet abnormality is noted. Endplate sclerosis is noted throughout the cervical spine although additional areas of sclerosis are noted similar to that seen on prior bone scan consistent with previous metastatic disease. Soft tissues and spinal canal: No specific soft tissue abnormality is noted. Upper chest: Within normal limits. Other: None IMPRESSION: CT of the head: Chronic atrophic and ischemic changes without acute intracranial abnormality. CT of the maxillofacial bones: Fractures involving anterior and posterolateral walls of the maxillary antrum as well as the inferior and lateral walls of the left orbit. Irregularity in the lateral orbital rim is noted as  well suspicious for undisplaced fracture. Associated left maxillary antrum changes are seen as described. No other significant fracture is noted. CT of cervical spine: Changes consistent with multilevel degenerative change and findings of prior prostate metastatic disease. No acute abnormality is noted. Electronically Signed   By: Inez Catalina M.D.   On: 08/19/2018 16:54   US Venous Img Lower Bilateral  Result Date: 08/10/2018 CLINICAL DATA:  85 year old male with pulmonary embolism. Evaluate for residual clot burden EXAM: BILATERAL LOWER EXTREMITY VENOUS DOPPLER ULTRASOUND TECHNIQUE: Gray-scale sonography with graded compression, as well as color Doppler and duplex ultrasound were performed to evaluate the lower extremity deep venous systems from the level of the common femoral vein and including the common femoral, femoral, profunda femoral, popliteal and calf veins including the posterior tibial, peroneal and gastrocnemius veins when visible. The superficial great saphenous vein was also interrogated. Spectral Doppler was utilized to evaluate flow at rest and with distal augmentation maneuvers in the common femoral, femoral and popliteal  veins. COMPARISON:  None. FINDINGS: RIGHT LOWER EXTREMITY Common Femoral Vein: No evidence of thrombus. Normal compressibility, respiratory phasicity and response to augmentation. Saphenofemoral Junction: No evidence of thrombus. Normal compressibility and flow on color Doppler imaging. Profunda Femoral Vein: No evidence of thrombus. Normal compressibility and flow on color Doppler imaging. Femoral Vein: No evidence of thrombus. Normal compressibility, respiratory phasicity and response to augmentation. Popliteal Vein: No evidence of thrombus. Normal compressibility, respiratory phasicity and response to augmentation. Calf Veins: Normal appearance of the peroneal veins. However, the posterior tibial veins demonstrate no evidence of color flow on color Doppler imaging and are noncompressible consistent with acute occlusive DVT. Superficial Great Saphenous Vein: No evidence of thrombus. Normal compressibility. Venous Reflux:  None. Other Findings:  None. LEFT LOWER EXTREMITY Common Femoral Vein: No evidence of thrombus. Normal compressibility, respiratory phasicity and response to augmentation. Saphenofemoral Junction: No evidence of thrombus. Normal compressibility and flow on color Doppler imaging. Profunda Femoral Vein: No evidence of thrombus. Normal compressibility and flow on color Doppler imaging. Femoral Vein: No evidence of thrombus. Normal compressibility, respiratory phasicity and response to augmentation. Popliteal Vein: No evidence of thrombus. Normal compressibility, respiratory phasicity and response to augmentation. Calf Veins: Normal flow in the posterior tibial veins. Abnormal appearance of the peroneal veins. The peroneal veins are not compressible in the lumen filled with low-level internal echoes consistent with acute occlusive DVT. Superficial Great Saphenous Vein: No evidence of thrombus. Normal compressibility. Venous Reflux:  None. Other Findings:  None. IMPRESSION: Positive for acute  bilateral deep venous thrombosis isolated to the calf veins (posterior tibial veins on the right, and peroneal veins on the left). Electronically Signed   By: Jacqulynn Cadet M.D.   On: 08/10/2018 14:17   Dg Esophagus W Double Cm (hd)  Result Date: 08/18/2018 CLINICAL DATA:  Dysphagia for 1 year.  Dysphagia with solids. EXAM: ESOPHOGRAM / BARIUM SWALLOW / BARIUM TABLET STUDY TECHNIQUE: Combined double contrast and single contrast examination performed using effervescent crystals, thick barium liquid, and thin barium liquid. The patient was observed with fluoroscopy swallowing a 13 mm barium sulphate tablet. FLUOROSCOPY TIME:  Fluoroscopy Time:  1 minutes 30 seconds Radiation Exposure Index (if provided by the fluoroscopic device): 10 mGy Number of Acquired Spot Images: 0 COMPARISON:  None. FINDINGS: There was normal pharyngeal anatomy and motility. Contrast flowed freely through the esophagus without evidence of mass. Mild smooth relative narrowing of the distal esophagus just proximal to the esophagogastric junction which does not restrict the passage of  a barium tablet. Normal esophageal mucosa without evidence of irregularity or ulceration. Tertiary contractions of the esophagus as can be seen with mild spasm. Mild gastroesophageal reflux. No definite hiatal hernia was demonstrated. At the end of the examination a 13 mm barium tablet was administered which transited through the esophagus and esophagogastric junction without delay. IMPRESSION: 1. Mild smooth relative narrowing of the distal esophagus just proximal to the esophagogastric junction which does not restrict the passage of a barium tablet. 2. Tertiary contractions of the esophagus as can be seen with mild spasm. Electronically Signed   By: Kathreen Devoid   On: 08/18/2018 10:40   Ct Maxillofacial Wo Contrast  Result Date: 08/19/2018 CLINICAL DATA:  Recent trip and fall with pain, initial encounter EXAM: CT HEAD WITHOUT CONTRAST CT MAXILLOFACIAL  WITHOUT CONTRAST CT CERVICAL SPINE WITHOUT CONTRAST TECHNIQUE: Multidetector CT imaging of the head, cervical spine, and maxillofacial structures were performed using the standard protocol without intravenous contrast. Multiplanar CT image reconstructions of the cervical spine and maxillofacial structures were also generated. COMPARISON:  07/10/2016 FINDINGS: CT HEAD FINDINGS Brain: Chronic atrophic and ischemic changes are noted. No findings to suggest acute hemorrhage, acute infarction or space-occupying mass lesion are noted. Vascular: No hyperdense vessel or unexpected calcification. Skull: No acute fracture is seen. There is irregularity identified in the lateral orbital ridge on the left incompletely evaluated on this exam. Other: None CT MAXILLOFACIAL FINDINGS Osseous: Irregularity is noted in the lateral orbital ridge with mild displacement identified. Additionally there is a fracture through the lateral and inferior walls of the left orbit identified. Opacification of the left maxillary antrum is noted as well as fractures through the anterior and lateral walls of the maxillary antrum on the left. No nasal bone fractures are seen. No other definitive fracture is noted. Orbits: Orbits and their contents are within normal limits. No evidence of significant orbital herniation is noted. No muscular entrapment is seen. Sinuses: Paranasal sinuses demonstrate opacification predominately with hemorrhage within the left maxillary antrum. Remainder of the paranasal sinuses are well aerated. Soft tissues: Mild soft tissue swelling is noted over the left cheek and left orbital region consistent with the recent injury. No sizable hematoma is noted. CT CERVICAL SPINE FINDINGS Alignment: Within normal limits. Skull base and vertebrae: 7 cervical segments are well visualized. Vertebral body height is well maintained. Multilevel disc space narrowing is noted from C3 to C7 with mild associated osteophytes. Facet hypertrophic  changes are noted. No acute fracture or acute facet abnormality is noted. Endplate sclerosis is noted throughout the cervical spine although additional areas of sclerosis are noted similar to that seen on prior bone scan consistent with previous metastatic disease. Soft tissues and spinal canal: No specific soft tissue abnormality is noted. Upper chest: Within normal limits. Other: None IMPRESSION: CT of the head: Chronic atrophic and ischemic changes without acute intracranial abnormality. CT of the maxillofacial bones: Fractures involving anterior and posterolateral walls of the maxillary antrum as well as the inferior and lateral walls of the left orbit. Irregularity in the lateral orbital rim is noted as well suspicious for undisplaced fracture. Associated left maxillary antrum changes are seen as described. No other significant fracture is noted. CT of cervical spine: Changes consistent with multilevel degenerative change and findings of prior prostate metastatic disease. No acute abnormality is noted. Electronically Signed   By: Inez Catalina M.D.   On: 08/19/2018 16:54    PERFORMANCE STATUS (ECOG) : 1 - Symptomatic but completely ambulatory  REVIEW OF  SYSTEMS: As noted above. Otherwise, a complete review of systems is negative.  PHYSICAL EXAM: General: NAD, frail appearing, thin Cardiovascular: regular rate and rhythm Pulmonary: clear ant fields Abdomen: soft, nontender, + bowel sounds GU: no suprapubic tenderness Extremities: no edema, no joint deformities Skin: no rashes Neurological: Weakness but otherwise nonfocal  IMPRESSION: I met with patient and his wife today for routine follow-up.  Patient was seen today by Dr. Tasia Catchings.  Dr. Tasia Catchings is recommending against further treatment.  Patient has continued to lose weight (weight down to 149 pounds today from 155 pounds 10 days ago).  He is also had some worsening confusion and decline in performance status.  He is increasingly reliant on his wife  for care in the home.  Both patient and wife verbalized an understanding that patient was not felt to be a viable candidate for further treatment.  They recognize that in all likelihood patient may be nearing end-of-life.  We do lengthy discussion about hospice involvement in the home and what that would entail.  Wife would be interested in hospice at home.  We discussed CODE STATUS.  Patient would not want to be resuscitated or have his life prolonged artificially on machines.   I completed a MOST form today. The patient and family outlined their wishes for the following treatment decisions:  Cardiopulmonary Resuscitation: Do Not Attempt Resuscitation (DNR/No CPR)  Medical Interventions: Comfort Measures: Keep clean, warm, and dry. Use medication by any route, positioning, wound care, and other measures to relieve pain and suffering. Use oxygen, suction and manual treatment of airway obstruction as needed for comfort. Do not transfer to the hospital unless comfort needs cannot be met in current location.  Antibiotics: Determine use of limitation of antibiotics when infection occurs  IV Fluids: No IV fluids (provide other measures to ensure comfort)  Feeding Tube: No feeding tube    PLAN: -Hospice referral at home -DNR -MOST form completed as outlined above   Patient expressed understanding and was in agreement with this plan. He also understands that He can call clinic at any time with any questions, concerns, or complaints.     Time Total: 30 minutes  Visit consisted of counseling and education dealing with the complex and emotionally intense issues of symptom management and palliative care in the setting of serious and potentially life-threatening illness.Greater than 50%  of this time was spent counseling and coordinating care related to the above assessment and plan.  Signed by: Altha Harm, PhD, NP-C [phone] (Work Cell)

## 2018-09-01 ENCOUNTER — Telehealth: Payer: Self-pay | Admitting: *Deleted

## 2018-09-01 NOTE — Telephone Encounter (Signed)
Incoming fax from [employer] - requesting a RF for dexamethasone 4 mg. Dr. Tasia Catchings. Is this appropriate as patient is now under hospice care? The med was previously discontinued.

## 2018-09-01 NOTE — Telephone Encounter (Signed)
I have discontinued it in the past as it is not working for him.

## 2018-09-20 ENCOUNTER — Telehealth: Payer: Self-pay | Admitting: *Deleted

## 2018-09-20 ENCOUNTER — Encounter: Payer: Self-pay | Admitting: Oncology

## 2018-09-28 ENCOUNTER — Ambulatory Visit: Payer: Medicare Other | Admitting: Urology

## 2018-09-30 NOTE — Telephone Encounter (Signed)
Matthew Hunter from hospice left message on triage line notifying us that patient passed away this morning at 8:03am.

## 2018-09-30 DEATH — deceased

## 2018-11-14 ENCOUNTER — Other Ambulatory Visit: Payer: Medicare Other

## 2018-11-24 ENCOUNTER — Ambulatory Visit: Payer: Medicare Other | Admitting: Radiation Oncology

## 2020-07-27 IMAGING — CT CT ABD-PELV W/ CM
2 of 5 series · 12 of 36 positions shown, 15 images · IV contrast (iopamidol)
Comparison: CT Abdomen and Pelvis 03/25/2018. Whole-body bone scan
05/30/2018. Chest CTA 03/14/2018.

CLINICAL DATA: 88-year-old male with metastatic prostate cancer
with several weeks of progressive dysphagia, unintentional weight
loss.

EXAM:
CT CHEST, ABDOMEN, AND PELVIS WITH CONTRAST
TECHNIQUE: Multidetector CT imaging of the chest, abdomen and pelvis was
performed following the standard protocol during bolus
administration of intravenous contrast.
CONTRAST:  100mL S8YD8W-TSS IOPAMIDOL ([id number]-TSS) INJECTION 61%

[Series 2: cap with · axial · 0.71mm/px · z∈[-529,-44]mm · 9 of 123 slices shown, 12 images]
[im 13/123  mediastinal]
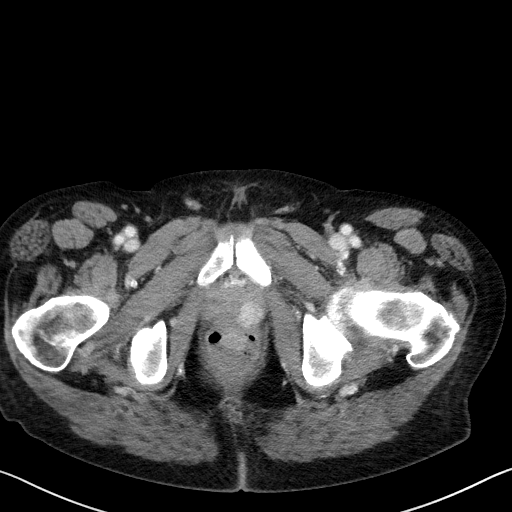
[im 13/123  lung]
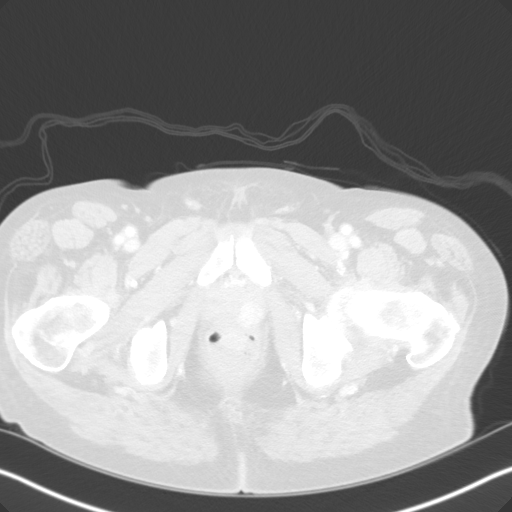
[im 25/123  lung]
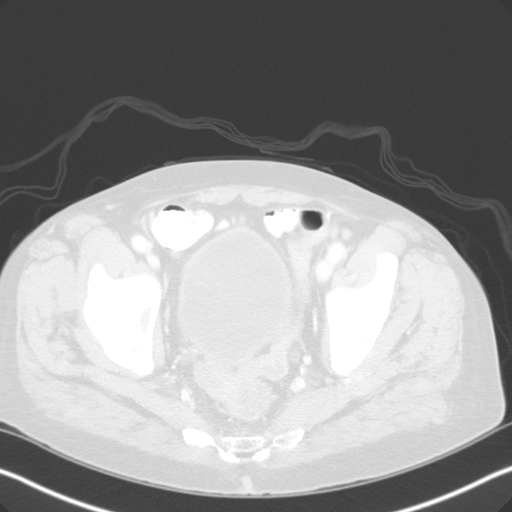
[im 37/123  lung]
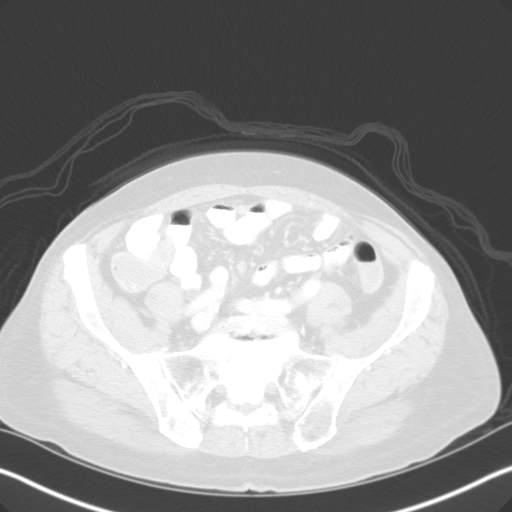
[im 49/123  lung]
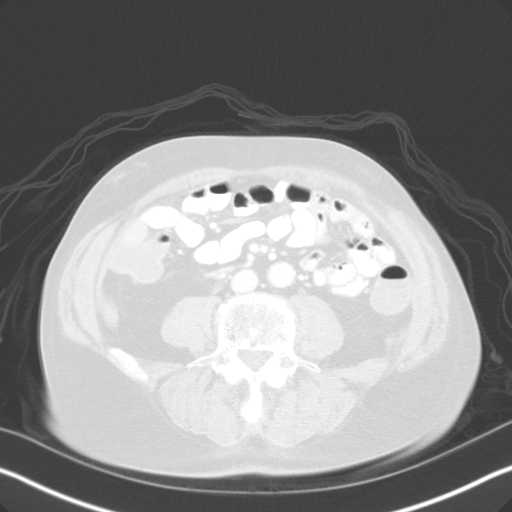
[im 62/123  mediastinal]
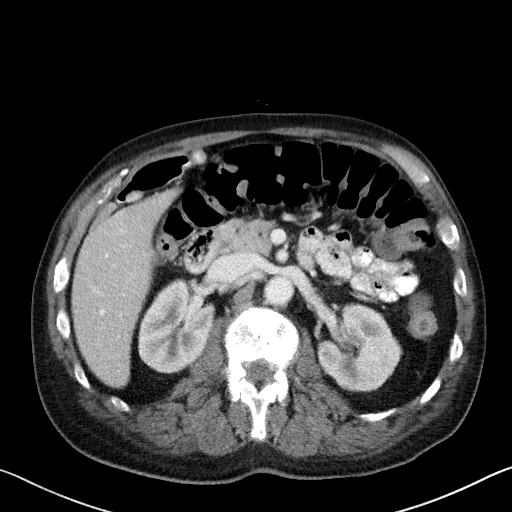
[im 62/123  lung]
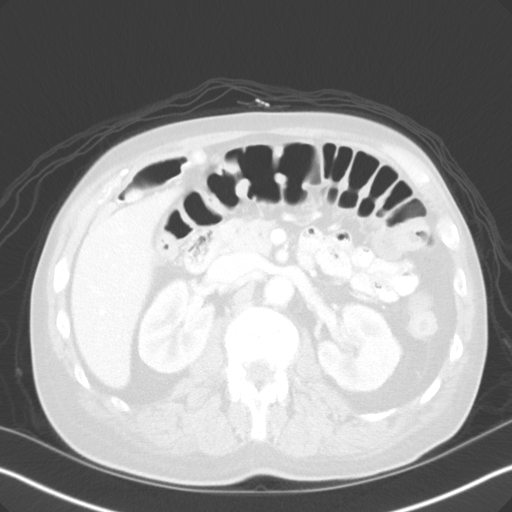
[im 74/123  lung]
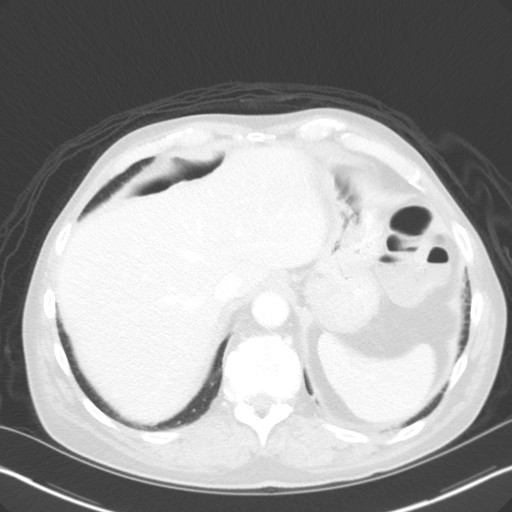
[im 86/123  lung]
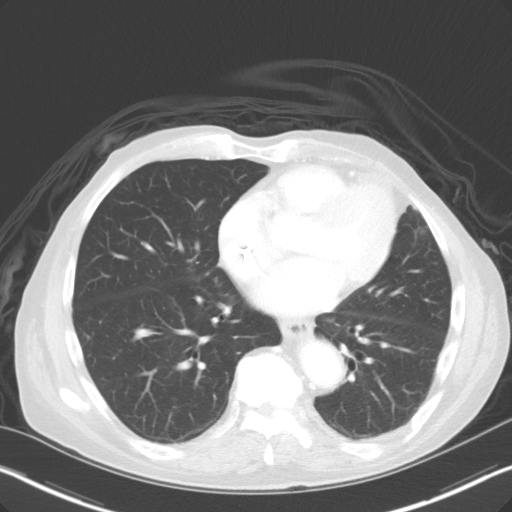
[im 98/123  lung]
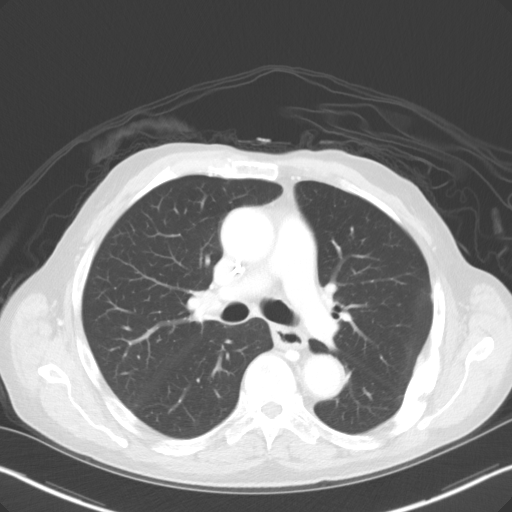
[im 110/123  mediastinal]
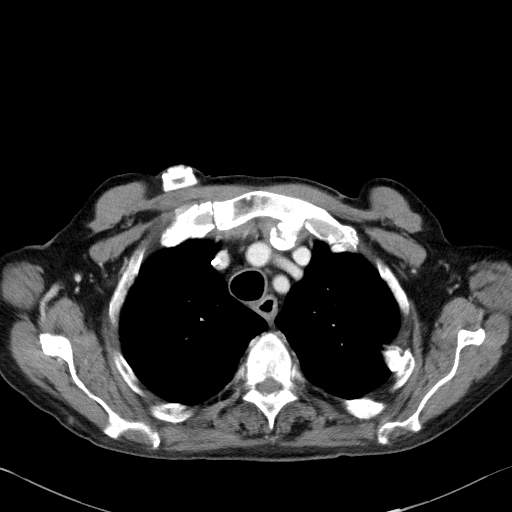
[im 110/123  lung]
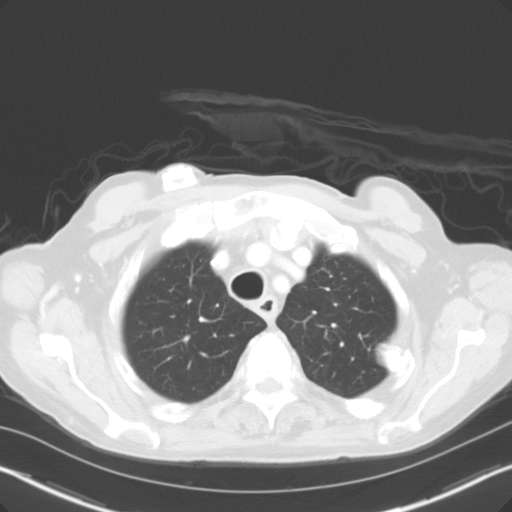

[Series 5: coronals · coronal · 0.66mm/px · 3 of 149 slices shown]
[im 30/149  lung]
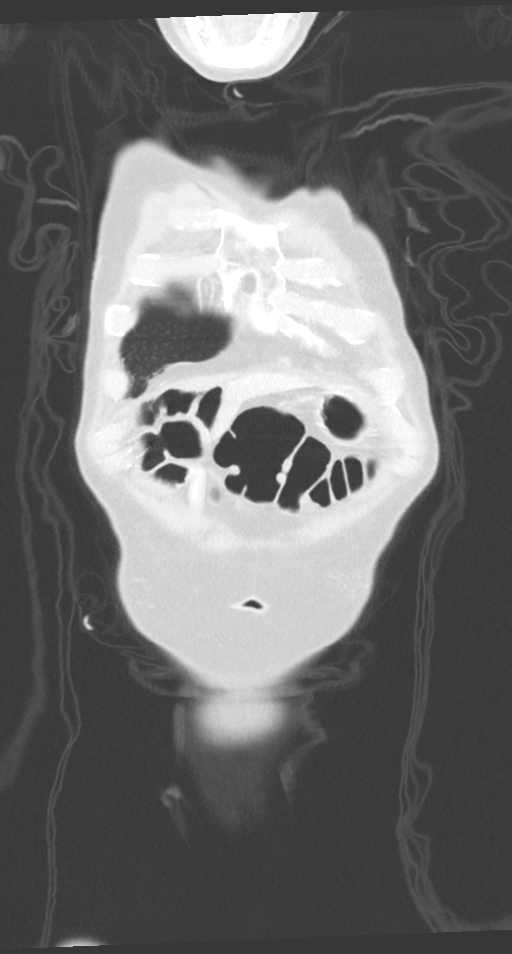
[im 60/149  lung]
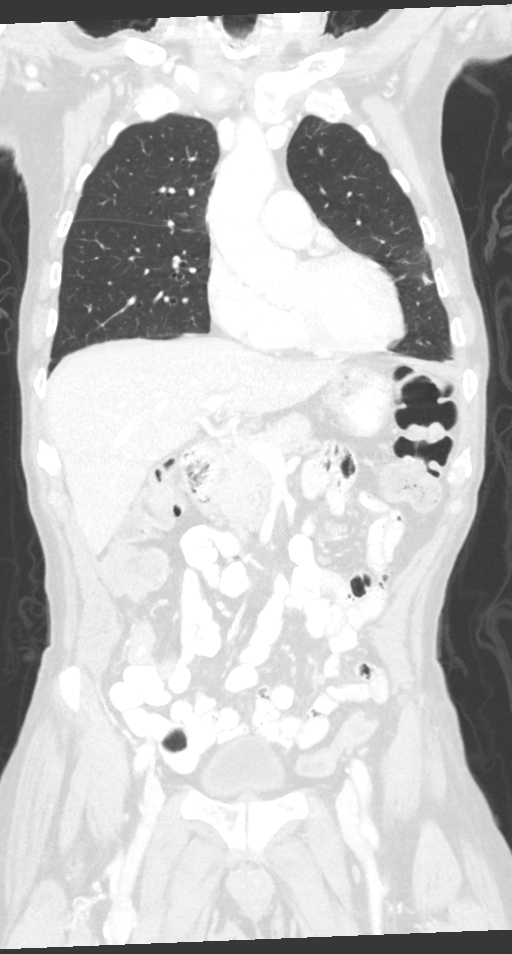
[im 89/149  lung]
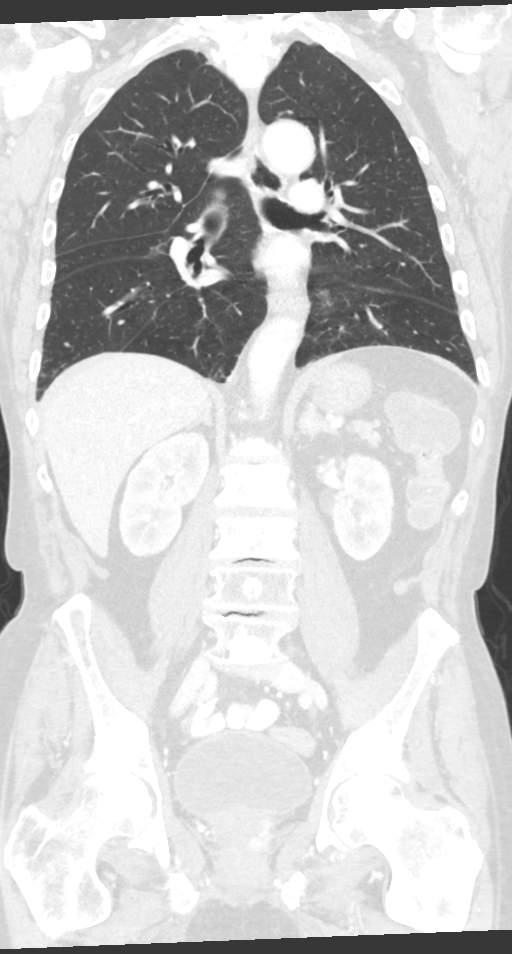

[12 of 36 positions shown; findings below may reference images not displayed]

FINDINGS: CT CHEST FINDINGS

Cardiovascular: No cardiomegaly or pericardial effusion. Stable
tortuosity of the thoracic aorta with mild for age atherosclerosis.
Right chest Port-A-Cath now in place. Calcified coronary artery
atherosclerosis (series 2, image 33).

Mediastinum/Nodes: Negative.  No lymphadenopathy.

The visible cervical esophagus and thoracic esophagus appear stable
since [REDACTED] and unremarkable down to the stomach.

Lungs/Pleura: Major airways are patent. No pleural effusion. There
is mild chronic scarring in the lingula. There is a small sub solid
right middle lobe pulmonary nodule measuring 3-4 millimeters which
was somewhat apparent in [REDACTED]. This appears benign/inflammatory.
Otherwise both lungs are clear aside from minor dependent
atelectasis.

Musculoskeletal: Extensive sclerotic skeletal metastases. Evidence
of epidural tumor in the thoracic spine at T9, eccentric to the left
on series 2, image 35 and involving the left neural foramen. This
was less conspicuous an Jui.

Other sclerotic metastases appear not significantly changed from the
5655 CTs. Superimposed chronic posttraumatic left upper rib
deformities.

CT ABDOMEN PELVIS FINDINGS

Hepatobiliary: Surgically absent gallbladder. Stable liver with
punctate probable benign occasional low-density areas in the right
lobe.

Pancreas: Negative.

Spleen: Negative.

Adrenals/Urinary Tract: Mildly lobulated adrenal gland thickening is
stable. Stable 7 millimeter left renal lower pole calculus.
Bilateral renal enhancement and contrast excretion is symmetric and
within normal limits. Negative course of both ureters. Urinary
bladder is stable and within normal limits.

Stomach/Bowel: The rectum and sigmoid colon appear within normal
limits. There is fluid in the descending colon and at the splenic
flexure, the segments are not abnormally dilated. Mostly gas in
nondilated transverse colon. Fluid in the right colon which is
nondilated. Normal appendix (coronal image 74).

Oral contrast throughout the small bowel has not yet reached the
terminal ileum. Negative TI. No dilated small bowel. Evidence of
prior Nissen fundoplication, otherwise negative stomach. Negative
duodenum.

No free air, free fluid.

Vascular/Lymphatic: Aortoiliac calcified atherosclerosis. Major
arterial structures in the abdomen and pelvis are patent. Portal
venous system is patent.

No lymphadenopathy.

Reproductive: Hyperenhancing soft tissue nodule in the left lower
prostate on series 2, image 111 measuring 17 millimeters is
indeterminate and stable since [REDACTED].

Other: No pelvic free fluid.

Musculoskeletal: Diffuse sclerotic metastases have not significantly
changed since [REDACTED]. No pathologic fracture identified.
IMPRESSION: 1. Widespread skeletal metastases with epidural tumor at the T9
spinal level which may have progressed since [DATE]. No explanation for dysphagia. Unremarkable esophagus and stable
post Nissen fundoplication appearance of the stomach.
No evidence of bowel obstruction, oral contrast has nearly reached
the large bowel.
No focal bowel inflammation, but fluid in the colon might indicate
diarrhea.
3. Indeterminate enhancement of the prostate is stable since
[REDACTED]. No lung or visceral metastases are identified.
